# Patient Record
Sex: Male | Born: 1945
Health system: Southern US, Community
[De-identification: ages and names within clinical notes are randomized; demographics above are authoritative.]

## PROBLEM LIST (undated history)

## (undated) DIAGNOSIS — R059 Cough, unspecified: Secondary | ICD-10-CM

## (undated) DIAGNOSIS — R600 Localized edema: Secondary | ICD-10-CM

## (undated) DIAGNOSIS — M1990 Unspecified osteoarthritis, unspecified site: Secondary | ICD-10-CM

## (undated) DIAGNOSIS — E538 Deficiency of other specified B group vitamins: Secondary | ICD-10-CM

## (undated) DIAGNOSIS — Z8042 Family history of malignant neoplasm of prostate: Secondary | ICD-10-CM

## (undated) DIAGNOSIS — J449 Chronic obstructive pulmonary disease, unspecified: Secondary | ICD-10-CM

## (undated) DIAGNOSIS — R05 Cough: Secondary | ICD-10-CM

## (undated) DIAGNOSIS — Z800 Family history of malignant neoplasm of digestive organs: Secondary | ICD-10-CM

## (undated) DIAGNOSIS — IMO0002 Reserved for concepts with insufficient information to code with codable children: Secondary | ICD-10-CM

## (undated) DIAGNOSIS — Z8051 Family history of malignant neoplasm of kidney: Secondary | ICD-10-CM

## (undated) DIAGNOSIS — E785 Hyperlipidemia, unspecified: Secondary | ICD-10-CM

## (undated) DIAGNOSIS — Z803 Family history of malignant neoplasm of breast: Secondary | ICD-10-CM

## (undated) DIAGNOSIS — I10 Essential (primary) hypertension: Secondary | ICD-10-CM

## (undated) DIAGNOSIS — C61 Malignant neoplasm of prostate: Secondary | ICD-10-CM

## (undated) HISTORY — DX: Localized edema: R60.0

## (undated) HISTORY — DX: Family history of malignant neoplasm of breast: Z80.3

## (undated) HISTORY — DX: Essential (primary) hypertension: I10

## (undated) HISTORY — PX: VASECTOMY: SHX75

## (undated) HISTORY — DX: Reserved for concepts with insufficient information to code with codable children: IMO0002

## (undated) HISTORY — DX: Deficiency of other specified B group vitamins: E53.8

## (undated) HISTORY — DX: Family history of malignant neoplasm of prostate: Z80.42

## (undated) HISTORY — DX: Cough: R05

## (undated) HISTORY — DX: Hyperlipidemia, unspecified: E78.5

## (undated) HISTORY — PX: PROSTATE BIOPSY: SHX241

## (undated) HISTORY — DX: Cough, unspecified: R05.9

## (undated) HISTORY — DX: Family history of malignant neoplasm of digestive organs: Z80.0

## (undated) HISTORY — DX: Family history of malignant neoplasm of kidney: Z80.51

---

## 2007-12-19 HISTORY — PX: COLONOSCOPY W/ POLYPECTOMY: SHX1380

## 2009-03-13 HISTORY — PX: FINGER SURGERY: SHX640

## 2011-01-12 DIAGNOSIS — J449 Chronic obstructive pulmonary disease, unspecified: Secondary | ICD-10-CM

## 2011-01-12 DIAGNOSIS — IMO0002 Reserved for concepts with insufficient information to code with codable children: Secondary | ICD-10-CM

## 2011-01-12 HISTORY — DX: Reserved for concepts with insufficient information to code with codable children: IMO0002

## 2011-01-12 HISTORY — DX: Chronic obstructive pulmonary disease, unspecified: J44.9

## 2011-01-12 HISTORY — PX: TRANSTHORACIC ECHOCARDIOGRAM: SHX275

## 2011-01-27 LAB — PULMONARY FUNCTION TEST

## 2012-04-13 LAB — HM COLONOSCOPY

## 2012-09-09 ENCOUNTER — Other Ambulatory Visit: Payer: Self-pay | Admitting: Nurse Practitioner

## 2012-09-09 ENCOUNTER — Telehealth: Payer: Self-pay | Admitting: Nurse Practitioner

## 2012-09-09 ENCOUNTER — Ambulatory Visit (INDEPENDENT_AMBULATORY_CARE_PROVIDER_SITE_OTHER): Payer: Medicare Other | Admitting: Nurse Practitioner

## 2012-09-09 ENCOUNTER — Ambulatory Visit (INDEPENDENT_AMBULATORY_CARE_PROVIDER_SITE_OTHER)
Admission: RE | Admit: 2012-09-09 | Discharge: 2012-09-09 | Disposition: A | Discharge status: Home / Self Care | Payer: Medicare Other | Source: Ambulatory Visit | Attending: Nurse Practitioner | Admitting: Nurse Practitioner

## 2012-09-09 ENCOUNTER — Encounter: Payer: Self-pay | Admitting: Nurse Practitioner

## 2012-09-09 VITALS — BP 152/83 | HR 88 | Temp 98.3°F | Resp 18 | Ht 72.0 in | Wt 293.0 lb

## 2012-09-09 DIAGNOSIS — I739 Peripheral vascular disease, unspecified: Secondary | ICD-10-CM | POA: Diagnosis not present

## 2012-09-09 DIAGNOSIS — J90 Pleural effusion, not elsewhere classified: Secondary | ICD-10-CM | POA: Diagnosis not present

## 2012-09-09 DIAGNOSIS — J189 Pneumonia, unspecified organism: Secondary | ICD-10-CM

## 2012-09-09 DIAGNOSIS — R0789 Other chest pain: Secondary | ICD-10-CM

## 2012-09-09 DIAGNOSIS — M25519 Pain in unspecified shoulder: Secondary | ICD-10-CM

## 2012-09-09 DIAGNOSIS — I10 Essential (primary) hypertension: Secondary | ICD-10-CM

## 2012-09-09 DIAGNOSIS — W57XXXA Bitten or stung by nonvenomous insect and other nonvenomous arthropods, initial encounter: Secondary | ICD-10-CM | POA: Diagnosis not present

## 2012-09-09 DIAGNOSIS — R0602 Shortness of breath: Secondary | ICD-10-CM | POA: Diagnosis not present

## 2012-09-09 DIAGNOSIS — T148 Other injury of unspecified body region: Secondary | ICD-10-CM | POA: Diagnosis not present

## 2012-09-09 LAB — RENAL FUNCTION PANEL
Albumin: 3.6 g/dL (ref 3.5–5.2)
BUN: 18 mg/dL (ref 6–23)
Calcium: 10.3 mg/dL (ref 8.4–10.5)
Phosphorus: 3.8 mg/dL (ref 2.3–4.6)
Potassium: 4 mEq/L (ref 3.5–5.3)
Sodium: 142 mEq/L (ref 135–145)

## 2012-09-09 LAB — CBC
Hemoglobin: 15.1 g/dL (ref 13.0–17.0)
MCH: 28.7 pg (ref 26.0–34.0)
MCHC: 35.3 g/dL (ref 30.0–36.0)
MCV: 81.2 fL (ref 78.0–100.0)

## 2012-09-09 LAB — LIPID PANEL
Cholesterol: 156 mg/dL (ref 0–200)
Total CHOL/HDL Ratio: 4.6 Ratio
VLDL: 35 mg/dL (ref 0–40)

## 2012-09-09 LAB — HEPATIC FUNCTION PANEL
AST: 14 U/L (ref 0–37)
Alkaline Phosphatase: 77 U/L (ref 39–117)
Bilirubin, Direct: 0.2 mg/dL (ref 0.0–0.3)
Total Bilirubin: 0.7 mg/dL (ref 0.3–1.2)

## 2012-09-09 LAB — HEMOGLOBIN A1C: Hgb A1c MFr Bld: 5.2 % (ref <5.7)

## 2012-09-09 MED ORDER — AMOXICILLIN-POT CLAVULANATE ER 1000-62.5 MG PO TB12: 2.0000 | ORAL_TABLET | Freq: Two times a day (BID) | ORAL | Status: DC

## 2012-09-09 NOTE — Patient Instructions (Signed)
Start using compression stockings in the morning. Take a 10-15 min walk in the morning several days a week. Increase by 5 minutes each week as tolerated. Exercise calf by doing "pedal pushes" as shown to help with circulation. I will call with lab results. We will refer you to cardiology for further work-up of the cause of your leg swelling. You have a referral to orthopedics as well. Please follow up with Dr. Laury Axon in 1 month. Pleasure to meet you.

## 2012-09-09 NOTE — Telephone Encounter (Signed)
CXR indicates pt has L pneumonia. Will start augmentin ER. Repeat CXR in 6 wks. Pt advised.

## 2012-09-10 ENCOUNTER — Telehealth: Payer: Self-pay | Admitting: *Deleted

## 2012-09-10 ENCOUNTER — Encounter: Payer: Self-pay | Admitting: Nurse Practitioner

## 2012-09-10 DIAGNOSIS — I739 Peripheral vascular disease, unspecified: Secondary | ICD-10-CM | POA: Insufficient documentation

## 2012-09-10 DIAGNOSIS — M25519 Pain in unspecified shoulder: Secondary | ICD-10-CM | POA: Insufficient documentation

## 2012-09-10 DIAGNOSIS — I10 Essential (primary) hypertension: Secondary | ICD-10-CM | POA: Insufficient documentation

## 2012-09-10 MED ORDER — HYDROCHLOROTHIAZIDE 25 MG PO TABS: 25.0000 mg | ORAL_TABLET | Freq: Every day | ORAL | Status: DC

## 2012-09-10 MED ORDER — LOSARTAN POTASSIUM 100 MG PO TABS: 100.0000 mg | ORAL_TABLET | Freq: Every day | ORAL | Status: DC

## 2012-09-10 NOTE — Telephone Encounter (Signed)
Called patient per Layne to schedule f/u appointment in one week 09/17/12 at 10:45 am. Also sent in hydrochlorothiazide and losartin rx's into [facility] on [address].

## 2012-09-10 NOTE — Progress Notes (Addendum)
Subjective:    Travis Daniels is a 67 y.o. male who presents for evaluation of edema in the left lower leg. He has never been seen by a provider in this practice, but plans to establish with Dr. Laury Axon. I have no records for review today. Pt & wife are giving medical history. The edema has been severe and accompanied by dusky discoloration and tenderness . Onset of symptoms was 4 days ago, and patient reports symptoms have gradually worsened since that time. The edema is present all day and worse in the evening. The patient states the problem is long-standing and has occured in the past, and in both legs. He understands the condition to be cellulitis, and was treated with antibiotics in the past.. The swelling has been aggravated by nothing and although, of note, 1 week ago he developed HA & fever with chest tightness that lasted 1 or 2 days followed by several days of fatigue & worsened leg edema.. The swelling has been relieved by nothing, although he has used compression stockings in the past for this problem.. Associated factors include: shortness of breath and venous insufficiency. Cardiac risk factors: advanced age (older than 73 for men, 72 for women), dyslipidemia, hypertension, male gender, obesity (BMI >= 30 kg/m2) and sedentary lifestyle. Of note, he gives Hx of tick bite under L axillae approx 10 days ago. He also removed 3 ticks from his wife. Unrelated, he gives Hx of bilateral shoulder pain & states he was told he has no cartilage in shoulders.  The following portions of the patient's history were reviewed and updated as appropriate: allergies, current medications, past family history, past medical history, past social history and problem list.  Review of Systems Constitutional: positive for chills, fatigue, fevers and sweats, negative for anorexia and weight loss Ears, nose, mouth, throat, and face: positive for sore throat, voice change and lesions in mouth Respiratory: positive for cough  and dyspnea on exertion, negative for chronic bronchitis, emphysema, hemoptysis, pleurisy/chest pain, sputum and wheezing Cardiovascular: positive for chest pressure/discomfort, dyspnea and lower extremity edema, negative for irregular heart beat, near-syncope and palpitations Gastrointestinal: negative for abdominal pain, change in bowel habits and vomiting Integument/breast: negative for rash and positive for color change in L LE-dusky discoloration Musculoskeletal:negative for arthralgias, muscle weakness and myalgias Neurological: positive for headaches, negative for coordination problems, dizziness and weakness   Objective:    BP 152/83  Pulse 88  Temp(Src) 98.3 F (36.8 C) (Oral)  Resp 18  Ht 6' (1.829 m)  Wt 293 lb (132.904 kg)  BMI 39.73 kg/m2  SpO2 96% General appearance: alert, cooperative, appears stated age, fatigued, no distress, moderately obese and pale Head: Normocephalic, without obvious abnormality, atraumatic Ears: normal TM's and external ear canals both ears Throat: mucosa pink, moist. Has cluster of lesions L upper gum at tooth that has filling-looks like a herpes rash. posterior pharynx normal. Lungs: diminished breath sounds LLL Heart: regular rate and rhythm, S1, S2 normal, no murmur, click, rub or gallop Extremities: edema +3 nonpitting LLE, +1 nonpitting RLE, post-inflam changes RLE-copper colored discoloration as in hemocyderin infiltration. Dusky discoloration and orange peel skin LLE., varicose veins noted and venous stasis dermatitis noted Pulses: 2+ RLE pedal, 3+ LLE pedal Skin: No rash, turgor normal, lesions in mouth-see ENT, see extremities. Lymph nodes: Cervical, supraclavicular, and axillary nodes normal.   Cardiographics ECG: normal sinus rhythm  Imaging Chest x-ray: R lung clear, L basilar airspace disease and effusion vs central lesion, elevated L hemidiaphragm, heart size  not clear  Assessment:  1 SOB, Hx chest tightness & fever 1 week ago,  CXR & CBC 2 LE edema with venus stasis secondary to peripheral vascular disease, ECG, BNP, TSH today 3 mouth lesions 4 hypertension, elevated in ofc today, check renal & liver func 5 Current Tx for hyperlipidemia-check lipids today 6Hx bilateral shoulder pain w/decreased ROM  Plan:    1 CXR shows LLL effusion. Will Tx as pneumonia. Start augmentin, f/u in ofc in 1 week. 2 Start using compression stockings, lower leg exercises, soaks warm tub, 10-15 min. Walk in am as tolerated Ref to cardiology for further w/u.  The patient was also instructed to call IMMEDIATELY (i.e., day or night) if any cardiopulmonary symptoms occur, especially chest pain, shortness of breath,  dyspnea on exertion, paroxysmal nocturnal dyspnea, or orthopnea, and these were explained. 3 uncertain etiology, CBC indicates infection-CXR shows effusion, may be r/t agent causing pneumonia. Continue to monitor. 4 elevated BP today, will continue to monitor, rec weight loss. 5 Continue current meds, exercise 6 referral to orthopedist

## 2012-09-11 LAB — ROCKY MTN SPOTTED FVR ABS PNL(IGG+IGM): RMSF IgG: 0.33 IV

## 2012-09-12 ENCOUNTER — Telehealth: Payer: Self-pay | Admitting: Nurse Practitioner

## 2012-09-12 NOTE — Telephone Encounter (Signed)
See telephone notes. Advised pt to go to ED if he experiences chest pain w/inspiration, fever, increased SOB. Advised to do lung exercises 3 times daily by blowing up a balloon. Continue compression stockings and antibiotics.

## 2012-09-17 ENCOUNTER — Ambulatory Visit (INDEPENDENT_AMBULATORY_CARE_PROVIDER_SITE_OTHER): Payer: Medicare Other | Admitting: Nurse Practitioner

## 2012-09-17 ENCOUNTER — Ambulatory Visit (INDEPENDENT_AMBULATORY_CARE_PROVIDER_SITE_OTHER)
Admission: RE | Admit: 2012-09-17 | Discharge: 2012-09-17 | Disposition: A | Discharge status: Home / Self Care | Payer: Medicare Other | Source: Ambulatory Visit | Attending: Nurse Practitioner | Admitting: Nurse Practitioner

## 2012-09-17 ENCOUNTER — Encounter: Payer: Self-pay | Admitting: Nurse Practitioner

## 2012-09-17 VITALS — BP 122/86 | HR 81 | Temp 98.1°F | Ht 72.0 in | Wt 296.5 lb

## 2012-09-17 DIAGNOSIS — I8312 Varicose veins of left lower extremity with inflammation: Secondary | ICD-10-CM

## 2012-09-17 DIAGNOSIS — I8310 Varicose veins of unspecified lower extremity with inflammation: Secondary | ICD-10-CM

## 2012-09-17 DIAGNOSIS — I872 Venous insufficiency (chronic) (peripheral): Secondary | ICD-10-CM | POA: Insufficient documentation

## 2012-09-17 DIAGNOSIS — R609 Edema, unspecified: Secondary | ICD-10-CM | POA: Diagnosis not present

## 2012-09-17 DIAGNOSIS — J9819 Other pulmonary collapse: Secondary | ICD-10-CM | POA: Diagnosis not present

## 2012-09-17 DIAGNOSIS — I10 Essential (primary) hypertension: Secondary | ICD-10-CM

## 2012-09-17 DIAGNOSIS — J189 Pneumonia, unspecified organism: Secondary | ICD-10-CM | POA: Diagnosis not present

## 2012-09-17 MED ORDER — HYDROCHLOROTHIAZIDE 25 MG PO TABS: ORAL_TABLET | ORAL | Status: DC

## 2012-09-17 NOTE — Patient Instructions (Addendum)
Continue antibiotics. Make taking a walk a priority to help with circulation. Increase hydrochlorathiazide to 1 1/2 Tabs daily.  Keep appointment with Dr. Laury Axon.

## 2012-09-17 NOTE — Progress Notes (Signed)
Subjective:     Travis Daniels is an 67 y.o. male who presents for follow up evaluation of pneumonia and periperal vascular disease with venous stasis. Regarding pneumonia: he had a CXR 1 week ago that showed LLL effusion with elevated L hemidiaphragm, WBC 13k, and gave a history of fever, chest tightness & SOB 1 week prior, with all symptoms resolved except SOB & fatigue. I started him on high dose amoxicillin. Today, he states fatigue has improved and denies chest pain, fever, and SOB.  Regarding peripheral vascular disease with venous stasis: He thinks L leg swelling has improved since he has been using compression stockings. He denies pain in legs. Although heart size was undeterminable on CXR 1 week ago due to LLL effusion & elevated L hemidiaphragm, it is unlikely that heart failure is a cause for his periperal edema as BNP was low at 19. Risks factors for atherosclerosis include: hypercholesterolemia, hypertension and peripheral vascular disease. The patient denies a history of cerebral vascular disease, diabetes mellitus and smoking.  The following portions of the patient's history were reviewed and updated as appropriate: allergies, current medications, past medical history, past social history and problem list.  Review of Systems Constitutional: negative for chills, fatigue, fevers and night sweats Ears, nose, mouth, throat, and face: negative for sore throat Respiratory: negative for cough, pleurisy/chest pain, sputum and wheezing Cardiovascular: positive for lower extremity edema and HTN, hypercholesterolemia, negative for chest pain, dyspnea, exertional chest pressure/discomfort, fatigue and palpitations Gastrointestinal: negative for diarrhea and nausea     Objective:    BP 122/86  Pulse 81  Temp(Src) 98.1 F (36.7 C) (Oral)  Ht 6' (1.829 m)  Wt 296 lb 8 oz (134.492 kg)  BMI 40.2 kg/m2  SpO2 95% General appearance: alert, cooperative, appears stated age and no  distress Lungs: diminished breath sounds LLL Heart: regular rate and rhythm, S1, S2 normal, no murmur, click, rub or gallop Extremities: edema +3 pitting edema to proximal tibia LLE, varicose veins noted, venous stasis dermatitis noted and dusky discoloration is unchanged Skin: no orange peel skin today on LLE Lymph nodes: Cervical, supraclavicular, and axillary nodes normal.    Assessment:  1 pneumonia-  fatigue & SOB resolved. 2 days antibiotics left. 2 HTN- initially elevated in office, normal range after sitting in chair.  3 peripheral edema with venous stasis dermatitis. Dusky color and edema persistent, orange peel skin resolved.       Plan:  1 f/u CXR today, Continue antibiotics until finished. 2 Continue Cozaar, 3 Continue compression stockings, 10-15 minute walk daily in am to improve circulation, increase HCTZ from 1T to 1 1/2 T daily until appt. W/Dr. Laury Axon.

## 2012-09-23 DIAGNOSIS — M25519 Pain in unspecified shoulder: Secondary | ICD-10-CM | POA: Diagnosis not present

## 2012-09-23 DIAGNOSIS — M19019 Primary osteoarthritis, unspecified shoulder: Secondary | ICD-10-CM | POA: Diagnosis not present

## 2012-09-26 ENCOUNTER — Telehealth: Payer: Self-pay | Admitting: Nurse Practitioner

## 2012-09-26 DIAGNOSIS — M25519 Pain in unspecified shoulder: Secondary | ICD-10-CM | POA: Diagnosis not present

## 2012-09-26 DIAGNOSIS — M25669 Stiffness of unspecified knee, not elsewhere classified: Secondary | ICD-10-CM | POA: Diagnosis not present

## 2012-09-26 DIAGNOSIS — M19019 Primary osteoarthritis, unspecified shoulder: Secondary | ICD-10-CM | POA: Diagnosis not present

## 2012-09-26 NOTE — Telephone Encounter (Signed)
2nd CXR shows no worsening in L effusion, heart is normal size. Discussed w/wife. Pt is at PT for shoulder eval, after having seen ortho.

## 2012-09-26 NOTE — Telephone Encounter (Signed)
Message copied by Maximino Sarin [facility] on Thu Sep 26, 2012  9:46 AM ------      Message from: Alben Spittle, [place] [facility]      Created: Tue Sep 17, 2012  4:57 PM       Call re: CXR. ------

## 2012-09-30 ENCOUNTER — Encounter: Payer: Self-pay | Admitting: Family Medicine

## 2012-09-30 ENCOUNTER — Ambulatory Visit (INDEPENDENT_AMBULATORY_CARE_PROVIDER_SITE_OTHER): Payer: Medicare Other | Admitting: Family Medicine

## 2012-09-30 VITALS — BP 152/94 | HR 65 | Temp 98.5°F | Ht 72.75 in | Wt 300.0 lb

## 2012-09-30 DIAGNOSIS — Z0000 Encounter for general adult medical examination without abnormal findings: Secondary | ICD-10-CM | POA: Diagnosis not present

## 2012-09-30 DIAGNOSIS — M25519 Pain in unspecified shoulder: Secondary | ICD-10-CM

## 2012-09-30 DIAGNOSIS — R609 Edema, unspecified: Secondary | ICD-10-CM | POA: Diagnosis not present

## 2012-09-30 DIAGNOSIS — Z23 Encounter for immunization: Secondary | ICD-10-CM | POA: Diagnosis not present

## 2012-09-30 DIAGNOSIS — I10 Essential (primary) hypertension: Secondary | ICD-10-CM

## 2012-09-30 DIAGNOSIS — R600 Localized edema: Secondary | ICD-10-CM

## 2012-09-30 MED ORDER — FUROSEMIDE 20 MG PO TABS: 20.0000 mg | ORAL_TABLET | Freq: Every day | ORAL | Status: DC

## 2012-09-30 MED ORDER — AMLODIPINE BESYLATE 5 MG PO TABS: 5.0000 mg | ORAL_TABLET | Freq: Every day | ORAL | Status: DC

## 2012-09-30 MED ORDER — ZOSTER VACCINE LIVE 19400 UNT/0.65ML ~~LOC~~ SOLR: 0.6500 mL | Freq: Once | SUBCUTANEOUS | Status: DC

## 2012-09-30 MED ORDER — LOSARTAN POTASSIUM 100 MG PO TABS: 100.0000 mg | ORAL_TABLET | Freq: Every day | ORAL | Status: DC

## 2012-09-30 NOTE — Assessment & Plan Note (Signed)
Per ortho.  

## 2012-09-30 NOTE — Progress Notes (Signed)
Subjective:    [name], male    DOB: [birth year], 67 y.o.   MRN: [id number]  HPI    Review of Systems     Objective:   Physical Exam        Assessment & Plan:   Subjective:    Travis Daniels is a 67 y.o. male who presents for Medicare Initial preventive examination.   Preventive Screening-Counseling & Management  Tobacco History  Smoking status  . Never Smoker   Smokeless tobacco  . Not on file    Problems Prior to Visit 1. S/p pneumonia  Current Problems (verified) Patient Active Problem List   Diagnosis Date Noted  . Venous stasis dermatitis 09/17/2012  . Essential hypertension, benign 09/10/2012  . Peripheral vascular disease 09/10/2012  . Pain in joint, shoulder region 09/10/2012    Medications Prior to Visit Current Outpatient Prescriptions on File Prior to Visit  Medication Sig Dispense Refill  . aspirin 81 MG tablet Take 81 mg by mouth daily.      . Cholecalciferol (VITAMIN D-3) 1000 UNITS CAPS Take 1,000 Units by mouth.      . hydrochlorothiazide (HYDRODIURIL) 25 MG tablet Take 1 1/2 tabs po qd.  30 tablet  1  . tiotropium (SPIRIVA) 18 MCG inhalation capsule Place 18 mcg into inhaler and inhale daily.      . vitamin B-12 (CYANOCOBALAMIN) 1000 MCG tablet Take 1,000 mcg by mouth daily.       No current facility-administered medications on file prior to visit.    Current Medications (verified) Current Outpatient Prescriptions  Medication Sig Dispense Refill  . aspirin 81 MG tablet Take 81 mg by mouth daily.      . Cholecalciferol (VITAMIN D-3) 1000 UNITS CAPS Take 1,000 Units by mouth.      . hydrochlorothiazide (HYDRODIURIL) 25 MG tablet Take 1 1/2 tabs po qd.  30 tablet  1  . losartan (COZAAR) 100 MG tablet Take 1 tablet (100 mg total) by mouth daily.  90 tablet  3  . tiotropium (SPIRIVA) 18 MCG inhalation capsule Place 18 mcg into inhaler and inhale daily.      . vitamin B-12 (CYANOCOBALAMIN) 1000 MCG tablet Take 1,000  mcg by mouth daily.      . furosemide (LASIX) 20 MG tablet Take 1 tablet (20 mg total) by mouth daily.  30 tablet  3   No current facility-administered medications for this visit.     Allergies (verified) Review of patient's allergies indicates no known allergies.   PAST HISTORY  Family History Family History  Problem Relation Age of Onset  . Cancer Father     colon and pancreas  . Epilepsy Son     Social History History  Substance Use Topics  . Smoking status: Never Smoker   . Smokeless tobacco: Not on file  . Alcohol Use: No    Are there smokers in your home (other than you)?  No  Risk Factors Current exercise habits: walks a little  Dietary issues discussed: na   Cardiac risk factors: advanced age (older than 41 for men, 69 for women), dyslipidemia, hypertension, male gender, obesity (BMI >= 30 kg/m2) and sedentary lifestyle.  Depression Screen (Note: if answer to either of the following is "Yes", a more complete depression screening is indicated)   Q1: Over the past two weeks, have you felt down, depressed or hopeless? No  Q2: Over the past two weeks, have you felt little interest or pleasure in doing things?  No  Have you lost interest or pleasure in daily life? No  Do you often feel hopeless? No  Do you cry easily over simple problems? No  Activities of Daily Living In your present state of health, do you have any difficulty performing the following activities?:  Driving? No Managing money?  No Feeding yourself? No Getting from bed to chair? No Climbing a flight of stairs? No Preparing food and eating?: No Bathing or showering? No Getting dressed: No Getting to the toilet? No Using the toilet:No Moving around from place to place: No In the past year have you fallen or had a near fall?:No   Are you sexually active?  Yes  Do you have more than one partner?  No  Hearing Difficulties: No Do you often ask people to speak up or repeat themselves? No Do  you experience ringing or noises in your ears? No Do you have difficulty understanding soft or whispered voices? No   Do you feel that you have a problem with memory? No  Do you often misplace items? No  Do you feel safe at home?  Yes  Cognitive Testing  Alert? Yes  Normal Appearance?Yes  Oriented to person? Yes  Place? Yes   Time? Yes  Recall of three objects?  Yes  Can perform simple calculations? Yes  Displays appropriate judgment?Yes  Can read the correct time from a watch face?Yes   Advanced Directives have been discussed with the patient? Yes   List the Names of Other Physician/Practitioners you currently use: 1.  Ortho-- murphy, wainer  Indicate any recent Medical Services you may have received from other than [facility] providers in the past year (date may be approximate).  Immunization History  Administered Date(s) Administered  . Pneumococcal Conjugate 11/12/2011    Screening Tests Health Maintenance  Topic Date Due  . Tetanus/tdap  04/18/1964  . Zostavax  04/18/2005  . Pneumococcal Polysaccharide Vaccine Age 28 And Over  04/18/2010  . Influenza Vaccine  11/11/2012  . Colonoscopy  04/13/2022    All answers were reviewed with the patient and necessary referrals were made:  Loreen Freud, DO   09/30/2012   History reviewed:  He  has a past medical history of Hyperlipidemia; Hypertension; Cough; and B12 deficiency. He  does not have any pertinent problems on file. He  has no past surgical history on file. His family history includes Cancer in his father and Epilepsy in his son. He  reports that he has never smoked. He does not have any smokeless tobacco history on file. He reports that he does not drink alcohol or use illicit drugs. He has a current medication list which includes the following prescription(s): aspirin, vitamin d-3, hydrochlorothiazide, losartan, tiotropium, vitamin b-12, and furosemide. Current Outpatient Prescriptions on File Prior to Visit   Medication Sig Dispense Refill  . aspirin 81 MG tablet Take 81 mg by mouth daily.      . Cholecalciferol (VITAMIN D-3) 1000 UNITS CAPS Take 1,000 Units by mouth.      . hydrochlorothiazide (HYDRODIURIL) 25 MG tablet Take 1 1/2 tabs po qd.  30 tablet  1  . tiotropium (SPIRIVA) 18 MCG inhalation capsule Place 18 mcg into inhaler and inhale daily.      . vitamin B-12 (CYANOCOBALAMIN) 1000 MCG tablet Take 1,000 mcg by mouth daily.       No current facility-administered medications on file prior to visit.   He has No Known Allergies.  Review of Systems  Review of Systems  Constitutional: Negative for activity change, appetite change and fatigue.  HENT: Negative for hearing loss, congestion, tinnitus and ear discharge.  dentist--q56m Eyes: Negative for visual disturbance (see optho q1y -- vision corrected to 20/20 with glasses).  Respiratory: Negative for cough, chest tightness and shortness of breath.   Cardiovascular: Negative for chest pain, palpitations and leg swelling.  Gastrointestinal: Negative for abdominal pain, diarrhea, constipation and abdominal distention.  Genitourinary: Negative for urgency, frequency, decreased urine volume and difficulty urinating.  Musculoskeletal: Negative for back pain, arthralgias and gait problem.  Skin: Negative for color change, pallor and rash.  Neurological: Negative for dizziness, light-headedness, numbness and headaches.  Hematological: Negative for adenopathy. Does not bruise/bleed easily.  Psychiatric/Behavioral: Negative for suicidal ideas, confusion, sleep disturbance, self-injury, dysphoric mood, decreased concentration and agitation.  Pt is able to read and write and can do all ADLs No risk for falling No abuse/ violence in home   Objective:     Vision by Snellen chart: opth Blood pressure 138/90, pulse 65, temperature 98.5 F (36.9 C), temperature source Oral, height 6' 0.75" (1.848 m), weight 300 lb (136.079 kg), SpO2 96.00%. Body  mass index is 39.85 kg/(m^2).  BP 138/90  Pulse 65  Temp(Src) 98.5 F (36.9 C) (Oral)  Ht 6' 0.75" (1.848 m)  Wt 300 lb (136.079 kg)  BMI 39.85 kg/m2  SpO2 96% General appearance: alert, cooperative, appears stated age and no distress Head: Normocephalic, without obvious abnormality, atraumatic Eyes: conjunctivae/corneas clear. PERRL, EOM's intact. Fundi benign. Ears: normal TM's and external ear canals both ears Nose: Nares normal. Septum midline. Mucosa normal. No drainage or sinus tenderness. Throat: lips, mucosa, and tongue normal; teeth and gums normal Neck: no adenopathy, no carotid bruit, no JVD, supple, symmetrical, trachea midline and thyroid not enlarged, symmetric, no tenderness/mass/nodules Back: symmetric, no curvature. ROM normal. No CVA tenderness. Lungs: clear to auscultation bilaterally Chest wall: no tenderness Heart: regular rate and rhythm, S1, S2 normal, no murmur, click, rub or gallop Abdomen: soft, non-tender; bowel sounds normal; no masses,  no organomegaly-- umbilical hernia Male genitalia: normal, deferred, penis: no lesions or discharge. testes: no masses or tenderness. no hernias Rectal: normal tone, normal prostate, no masses or tenderness and soft brown guaiac negative stool noted Extremities: + 1 pitting edema and stasis dermatitis Pulses: 2+ and symmetric Skin: Skin color, texture, turgor normal. No rashes or lesions Lymph nodes: Cervical, supraclavicular, and axillary nodes normal. Neurologic: Alert and oriented X 3, normal strength and tone. Normal symmetric reflexes. Normal coordination and gait Psych-- no anxiety, no depression      Assessment:     cpe--- check labs             ghm utd         See avs      Plan:     During the course of the visit the patient was educated and counseled about appropriate screening and preventive services including:    Pneumococcal vaccine   Influenza vaccine  Td vaccine  Prostate cancer  screening  Colorectal cancer screening  Diabetes screening  Glaucoma screening  Advanced directives: has NO advanced directive  - add't info requested. Referral to SW: pt told about class at hospital  Diet review for nutrition referral? Yes ____  Not Indicated ___x_   Patient Instructions (the written plan) was given to the patient.  Medicare Attestation I have personally reviewed: The patient's medical and social history Their use of alcohol, tobacco or illicit drugs Their current medications and supplements  The patient's functional ability including ADLs,fall risks, home safety risks, cognitive, and hearing and visual impairment Diet and physical activities Evidence for depression or mood disorders  The patient's weight, height, BMI, and visual acuity have been recorded in the chart.  I have made referrals, counseling, and provided education to the patient based on review of the above and I have provided the patient with a written personalized care plan for preventive services.     Loreen Freud, DO   09/30/2012

## 2012-09-30 NOTE — Assessment & Plan Note (Signed)
Elevated--- add norvasc 5 m g rto 3 weeks or sooner prn

## 2012-09-30 NOTE — Patient Instructions (Addendum)
Preventive Care for Adults, Male  A healthy lifestyle and preventive care can promote health and wellness. Preventive health guidelines for men include the following key practices:  · A routine yearly physical is a good way to check with your caregiver about your health and preventative screening. It is a chance to share any concerns and updates on your health, and to receive a thorough exam.  · Visit your dentist for a routine exam and preventative care every 6 months. Brush your teeth twice a day and floss once a day. Good oral hygiene prevents tooth decay and gum disease.  · The frequency of eye exams is based on your age, health, family medical history, use of contact lenses, and other factors. Follow your caregiver's recommendations for frequency of eye exams.  · Eat a healthy diet. Foods like vegetables, fruits, whole grains, low-fat dairy products, and lean protein foods contain the nutrients you need without too many calories. Decrease your intake of foods high in solid fats, added sugars, and salt. Eat the right amount of calories for you. Get information about a proper diet from your caregiver, if necessary.  · Regular physical exercise is one of the most important things you can do for your health. Most adults should get at least 150 minutes of moderate-intensity exercise (any activity that increases your heart rate and causes you to sweat) each week. In addition, most adults need muscle-strengthening exercises on 2 or more days a week.  · Maintain a healthy weight. The body mass index (BMI) is a screening tool to identify possible weight problems. It provides an estimate of body fat based on height and weight. Your caregiver can help determine your BMI, and can help you achieve or maintain a healthy weight. For adults 20 years and older:  · A BMI below 18.5 is considered underweight.  · A BMI of 18.5 to 24.9 is normal.  · A BMI of 25 to 29.9 is considered overweight.  · A BMI of 30 and above is  considered obese.  · Maintain normal blood lipids and cholesterol levels by exercising and minimizing your intake of saturated fat. Eat a balanced diet with plenty of fruit and vegetables. Blood tests for lipids and cholesterol should begin at age 20 and be repeated every 5 years. If your lipid or cholesterol levels are high, you are over 50, or you are a high risk for heart disease, you may need your cholesterol levels checked more frequently. Ongoing high lipid and cholesterol levels should be treated with medicines if diet and exercise are not effective.  · If you smoke, find out from your caregiver how to quit. If you do not use tobacco, do not start.  · If you choose to drink alcohol, do not exceed 2 drinks per day. One drink is considered to be 12 ounces (355 mL) of beer, 5 ounces (148 mL) of wine, or 1.5 ounces (44 mL) of liquor.  · Avoid use of street drugs. Do not share needles with anyone. Ask for help if you need support or instructions about stopping the use of drugs.  · High blood pressure causes heart disease and increases the risk of stroke. Your blood pressure should be checked at least every 1 to 2 years. Ongoing high blood pressure should be treated with medicines, if weight loss and exercise are not effective.  · If you are 45 to 67 years old, ask your caregiver if you should take aspirin to prevent heart disease.  · Diabetes screening involves taking   a blood sample to check your fasting blood sugar level. This should be done once every 3 years, after age 45, if you are within normal weight and without risk factors for diabetes. Testing should be considered at a younger age or be carried out more frequently if you are overweight and have at least 1 risk factor for diabetes.  · Colorectal cancer can be detected and often prevented. Most routine colorectal cancer screening begins at the age of 50 and continues through age 75. However, your caregiver may recommend screening at an earlier age if you  have risk factors for colon cancer. On a yearly basis, your caregiver may provide home test kits to check for hidden blood in the stool. Use of a small camera at the end of a tube, to directly examine the colon (sigmoidoscopy or colonoscopy), can detect the earliest forms of colorectal cancer. Talk to your caregiver about this at age 50, when routine screening begins.  Direct examination of the colon should be repeated every 5 to 10 years through age 75, unless early forms of pre-cancerous polyps or small growths are found.  · Hepatitis C blood testing is recommended for all people born from 1945 through 1965 and any individual with known risks for hepatitis C.  · Practice safe sex. Use condoms and avoid high-risk sexual practices to reduce the spread of sexually transmitted infections (STIs). STIs include gonorrhea, chlamydia, syphilis, trichomonas, herpes, HPV, and human immunodeficiency virus (HIV). Herpes, HIV, and HPV are viral illnesses that have no cure. They can result in disability, cancer, and death.  · A one-time screening for abdominal aortic aneurysm (AAA) and surgical repair of large AAAs by sound wave imaging (ultrasonography) is recommended for ages 65 to 75 years who are current or former smokers.  · Healthy men should no longer receive prostate-specific antigen (PSA) blood tests as part of routine cancer screening. Consult with your caregiver about prostate cancer screening.  · Testicular cancer screening is not recommended for adult males who have no symptoms. Screening includes self-exam, caregiver exam, and other screening tests. Consult with your caregiver about any symptoms you have or any concerns you have about testicular cancer.  · Use sunscreen with skin protection factor (SPF) of 30 or more. Apply sunscreen liberally and repeatedly throughout the day. You should seek shade when your shadow is shorter than you. Protect yourself by wearing long sleeves, pants, a wide-brimmed hat, and  sunglasses year round, whenever you are outdoors.  · Once a month, do a whole body skin exam, using a mirror to look at the skin on your back. Notify your caregiver of new moles, moles that have irregular borders, moles that are larger than a pencil eraser, or moles that have changed in shape or color.  · Stay current with required immunizations.  · Influenza. You need a dose every fall (or winter). The composition of the flu vaccine changes each year, so being vaccinated once is not enough.  · Pneumococcal polysaccharide. You need 1 to 2 doses if you smoke cigarettes or if you have certain chronic medical conditions. You need 1 dose at age 65 (or older) if you have never been vaccinated.  · Tetanus, diphtheria, pertussis (Tdap, Td). Get 1 dose of Tdap vaccine if you are younger than age 65 years, are over 65 and have contact with an infant, are a healthcare worker, or simply want to be protected from whooping cough. After that, you need a Td booster dose every 10 years. Consult your   caregiver if you have not had at least 3 tetanus and diphtheria-containing shots sometime in your life or have a deep or dirty wound.  · HPV. This vaccine is recommended for males 13 through 67 years of age. This vaccine may be given to men 22 through 67 years of age who have not completed the 3 dose series. It is recommended for men through age 26 who have sex with men or whose immune system is weakened because of HIV infection, other illness, or medications. The vaccine is given in 3 doses over 6 months.  · Measles, mumps, rubella (MMR). You need at least 1 dose of MMR if you were born in 1957 or later. You may also need a 2nd dose.  · Meningococcal. If you are age 19 to 21 years and a first-year college student living in a residence hall, or have one of several medical conditions, you need to get vaccinated against meningococcal disease. You may also need additional booster doses.  · Zoster (shingles). If you are age 60 years or  older, you should get this vaccine.  · Varicella (chickenpox). If you have never had chickenpox or you were vaccinated but received only 1 dose, talk to your caregiver to find out if you need this vaccine.  · Hepatitis A. You need this vaccine if you have a specific risk factor for hepatitis A virus infection, or you simply wish to be protected from this disease. The vaccine is usually given as 2 doses, 6 to 18 months apart.  · Hepatitis B. You need this vaccine if you have a specific risk factor for hepatitis B virus infection or you simply wish to be protected from this disease. The vaccine is given in 3 doses, usually over 6 months.  Preventative Service / Frequency  Ages 19 to 39  · Blood pressure check.** / Every 1 to 2 years.  · Lipid and cholesterol check.** / Every 5 years beginning at age 20.  · Hepatitis C blood test.** / For any individual with known risks for hepatitis C.  · Skin self-exam. / Monthly.  · Influenza immunization.** / Every year.  · Pneumococcal polysaccharide immunization.** / 1 to 2 doses if you smoke cigarettes or if you have certain chronic medical conditions.  · Tetanus, diphtheria, pertussis (Tdap,Td) immunization. / A one-time dose of Tdap vaccine. After that, you need a Td booster dose every 10 years.  · HPV immunization. / 3 doses over 6 months, if 26 and younger.  · Measles, mumps, rubella (MMR) immunization. / You need at least 1 dose of MMR if you were born in 1957 or later. You may also need a 2nd dose.  · Meningococcal immunization. / 1 dose if you are age 19 to 21 years and a first-year college student living in a residence hall, or have one of several medical conditions, you need to get vaccinated against meningococcal disease. You may also need additional booster doses.  · Varicella immunization.** / Consult your caregiver.  · Hepatitis A immunization.** / Consult your caregiver. 2 doses, 6 to 18 months apart.  · Hepatitis B immunization.** / Consult your caregiver. 3 doses  usually over 6 months.  Ages 40 to 64  · Blood pressure check.** / Every 1 to 2 years.  · Lipid and cholesterol check.** / Every 5 years beginning at age 20.  · Fecal occult blood test (FOBT) of stool. / Every year beginning at age 50 and continuing until age 75. You may not have   to do this test if you get colonoscopy every 10 years.  · Flexible sigmoidoscopy** or colonoscopy.** / Every 5 years for a flexible sigmoidoscopy or every 10 years for a colonoscopy beginning at age 50 and continuing until age 75.  · Hepatitis C blood test.** / For all people born from 1945 through 1965 and any individual with known risks for hepatitis C.  · Skin self-exam. / Monthly.  · Influenza immunization.** / Every year.  · Pneumococcal polysaccharide immunization.** / 1 to 2 doses if you smoke cigarettes or if you have certain chronic medical conditions.  · Tetanus, diphtheria, pertussis (Tdap/Td) immunization.** / A one-time dose of Tdap vaccine. After that, you need a Td booster dose every 10 years.  · Measles, mumps, rubella (MMR) immunization.  / You need at least 1 dose of MMR if you were born in 1957 or later. You may also need a 2nd dose.  · Varicella immunization.**/ Consult your caregiver.  · Meningococcal immunization.** / Consult your caregiver.  · Hepatitis A immunization.** / Consult your caregiver. 2 doses, 6 to 18 months apart.  · Hepatitis B immunization.** / Consult your caregiver. 3 doses, usually over 6 months.  Ages 65 and over  · Blood pressure check.** / Every 1 to 2 years.  · Lipid and cholesterol check.**/ Every 5 years beginning at age 20.  · Fecal occult blood test (FOBT) of stool. / Every year beginning at age 50 and continuing until age 75. You may not have to do this test if you get colonoscopy every 10 years.  · Flexible sigmoidoscopy** or colonoscopy.** / Every 5 years for a flexible sigmoidoscopy or every 10 years for a colonoscopy beginning at age 50 and continuing until age 75.  · Hepatitis C blood  test.** / For all people born from 1945 through 1965 and any individual with known risks for hepatitis C.  · Abdominal aortic aneurysm (AAA) screening.** / A one-time screening for ages 65 to 75 years who are current or former smokers.  · Skin self-exam. / Monthly.  · Influenza immunization.** / Every year.  · Pneumococcal polysaccharide immunization.** / 1 dose at age 65 (or older) if you have never been vaccinated.  · Tetanus, diphtheria, pertussis (Tdap, Td) immunization. / A one-time dose of Tdap vaccine if you are over 65 and have contact with an infant, are a healthcare worker, or simply want to be protected from whooping cough. After that, you need a Td booster dose every 10 years.  · Varicella immunization. ** / Consult your caregiver.  · Meningococcal immunization.** / Consult your caregiver.  · Hepatitis A immunization. ** / Consult your caregiver. 2 doses, 6 to 18 months apart.  · Hepatitis B immunization.** / Check with your caregiver. 3 doses, usually over 6 months.  **Family history and personal history of risk and conditions may change your caregiver's recommendations.  Document Released: 04/25/2001 Document Revised: 05/22/2011 Document Reviewed: 07/25/2010  Exit[employer]® Patient Information ©2014 [employer].

## 2012-10-03 DIAGNOSIS — M19019 Primary osteoarthritis, unspecified shoulder: Secondary | ICD-10-CM | POA: Diagnosis not present

## 2012-10-04 ENCOUNTER — Other Ambulatory Visit (INDEPENDENT_AMBULATORY_CARE_PROVIDER_SITE_OTHER): Payer: Medicare Other

## 2012-10-04 DIAGNOSIS — R319 Hematuria, unspecified: Secondary | ICD-10-CM

## 2012-10-04 DIAGNOSIS — I10 Essential (primary) hypertension: Secondary | ICD-10-CM | POA: Diagnosis not present

## 2012-10-04 DIAGNOSIS — R600 Localized edema: Secondary | ICD-10-CM

## 2012-10-04 DIAGNOSIS — R609 Edema, unspecified: Secondary | ICD-10-CM

## 2012-10-04 LAB — BASIC METABOLIC PANEL
CO2: 33 mEq/L — ABNORMAL HIGH (ref 19–32)
Calcium: 9.1 mg/dL (ref 8.4–10.5)
Creatinine, Ser: 1.3 mg/dL (ref 0.4–1.5)
GFR: 61.15 mL/min (ref >60.00)
Sodium: 140 mEq/L (ref 135–145)

## 2012-10-04 LAB — CBC WITH DIFFERENTIAL/PLATELET
Basophils Relative: 0.9 % (ref 0.0–3.0)
Hemoglobin: 14.9 g/dL (ref 13.0–17.0)
Lymphocytes Relative: 26.7 % (ref 12.0–46.0)
Monocytes Relative: 9.9 % (ref 3.0–12.0)
Neutro Abs: 4.1 10*3/uL (ref 1.4–7.7)
Neutrophils Relative %: 57.7 % (ref 43.0–77.0)
RBC: 5.09 Mil/uL (ref 4.22–5.81)
WBC: 7.2 10*3/uL (ref 4.5–10.5)

## 2012-10-04 LAB — HEPATIC FUNCTION PANEL
AST: 18 U/L (ref 0–37)
Albumin: 3.5 g/dL (ref 3.5–5.2)
Alkaline Phosphatase: 70 U/L (ref 39–117)
Bilirubin, Direct: 0.1 mg/dL (ref 0.0–0.3)
Total Protein: 6.8 g/dL (ref 6.0–8.3)

## 2012-10-04 LAB — POCT URINALYSIS DIPSTICK
Ketones, UA: NEGATIVE
Protein, UA: NEGATIVE
Spec Grav, UA: 1.01
Urobilinogen, UA: 0.2
pH, UA: 6.5

## 2012-10-04 LAB — MICROALBUMIN / CREATININE URINE RATIO
Creatinine,U: 115.2 mg/dL
Microalb, Ur: 2.8 mg/dL — ABNORMAL HIGH (ref 0.0–1.9)

## 2012-10-04 LAB — LIPID PANEL
HDL: 44.3 mg/dL (ref >39.00)
Total CHOL/HDL Ratio: 4
VLDL: 21.8 mg/dL (ref 0.0–40.0)

## 2012-10-06 LAB — URINE CULTURE: Colony Count: NO GROWTH

## 2012-10-24 ENCOUNTER — Other Ambulatory Visit: Payer: Self-pay | Admitting: *Deleted

## 2012-10-30 ENCOUNTER — Encounter: Payer: Self-pay | Admitting: Cardiology

## 2012-10-30 ENCOUNTER — Ambulatory Visit (INDEPENDENT_AMBULATORY_CARE_PROVIDER_SITE_OTHER): Payer: Medicare Other | Admitting: Cardiology

## 2012-10-30 VITALS — BP 140/80 | HR 71 | Ht 73.0 in | Wt 305.0 lb

## 2012-10-30 DIAGNOSIS — Z01810 Encounter for preprocedural cardiovascular examination: Secondary | ICD-10-CM | POA: Insufficient documentation

## 2012-10-30 DIAGNOSIS — R0602 Shortness of breath: Secondary | ICD-10-CM | POA: Diagnosis not present

## 2012-10-30 DIAGNOSIS — I10 Essential (primary) hypertension: Secondary | ICD-10-CM

## 2012-10-30 DIAGNOSIS — R0609 Other forms of dyspnea: Secondary | ICD-10-CM | POA: Diagnosis not present

## 2012-10-30 DIAGNOSIS — R079 Chest pain, unspecified: Secondary | ICD-10-CM | POA: Diagnosis not present

## 2012-10-30 DIAGNOSIS — R0600 Dyspnea, unspecified: Secondary | ICD-10-CM

## 2012-10-30 NOTE — Assessment & Plan Note (Signed)
Plan Myoview given chest pain. If negative may proceed with surgery.

## 2012-10-30 NOTE — Assessment & Plan Note (Signed)
Possible pulmonary contribution given occupational history of working in a [employer]. Will plan echocardiogram to assess LV systolic/diastolic function and RV function. May need pulmonary evaluation in the future.

## 2012-10-30 NOTE — Patient Instructions (Addendum)
Your physician recommends that you schedule a follow-up appointment in: AS NEEDED PENDING TEST RESULTS  Your physician has requested that you have en exercise stress myoview. For further information please visit [email or web address]. Please follow instruction sheet, as given.   Your physician has requested that you have an echocardiogram. Echocardiography is a painless test that uses sound waves to create images of your heart. It provides your doctor with information about the size and shape of your heart and how well your heart's chambers and valves are working. This procedure takes approximately one hour. There are no restrictions for this procedure.    

## 2012-10-30 NOTE — Assessment & Plan Note (Signed)
Symptoms somewhat atypical. Plan stress Myoview for risk stratification.

## 2012-10-30 NOTE — Progress Notes (Signed)
HPI: 67 year old male for evaluation of chest tightness. Seen in June with chest tightness and treated for possible pneumonia. Laboratories showed normal liver functions. Normal hemoglobin, BUN and creatinine of 18 and 1.33. TSH 0.746. BNP 19. Over the past 2 years she has noted dyspnea on exertion. This occurs with more moderate activities but not routine activities. No orthopnea or PND. He has chronic lower extremity edema treated with diuretics and compression stockings. He occasionally has tightness in the left chest area. It is not pleuritic, positional, related to food or exertional. It can last 2 hours at a time. No radiation or associated symptoms. Resolves spontaneously. He does not have exertional chest pain. Because of the above we were asked to evaluate.  Current Outpatient Prescriptions  Medication Sig Dispense Refill  . amLODipine (NORVASC) 5 MG tablet Take 1 tablet (5 mg total) by mouth daily.  30 tablet  3  . aspirin 81 MG tablet Take 81 mg by mouth daily.      . Cholecalciferol (VITAMIN D-3) 1000 UNITS CAPS Take 1,000 Units by mouth.      . furosemide (LASIX) 20 MG tablet Take 1 tablet (20 mg total) by mouth daily.  30 tablet  3  . losartan (COZAAR) 100 MG tablet Take 1 tablet (100 mg total) by mouth daily.  90 tablet  3  . tiotropium (SPIRIVA) 18 MCG inhalation capsule Place 18 mcg into inhaler and inhale daily.      . vitamin B-12 (CYANOCOBALAMIN) 1000 MCG tablet Take 1,000 mcg by mouth daily.       No current facility-administered medications for this visit.    No Known Allergies  Past Medical History  Diagnosis Date  . Hyperlipidemia   . Hypertension   . 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 B12 deficiency   . Lower extremity edema     Past Surgical History  Procedure Laterality Date  . Finger surgery      History   Social History  . Marital Status: Married    Spouse Name: N/A    Number of Children: 4  . Years of Education: N/A    Occupational History  . retired    Social History Main Topics  . Smoking status: Never Smoker   . Smokeless tobacco: Not on file  . Alcohol Use: Yes     Comment: Occasional  . Drug Use: No  . Sexual Activity: Yes   Other Topics Concern  . Not on file   Social History Narrative   Travis Daniels lives with his wife in [place]. He is recently retired and relocated to [facility] from [place]. Historically, his medical provider has been [facility] in [place].    Family History  Problem Relation Age of Onset  . Cancer Father     colon and pancreas  . Epilepsy Son   . CAD Mother     MI in her 50s    ROS: chronic lower extremity edema and right shoulder pain but no fevers or chills, productive cough, hemoptysis, dysphasia, odynophagia, melena, hematochezia, dysuria, hematuria, rash, seizure activity, orthopnea, PND, claudication. Remaining systems are negative.  Physical Exam:   Blood pressure 140/80, pulse 71, height 6\' 1"  (1.854 m), weight 305 lb (138.347 kg).  General:  Well developed/well nourished in NAD Skin warm/dry Patient not depressed No peripheral clubbing Back-normal HEENT-normal/normal eyelids Neck supple/normal carotid upstroke bilaterally; no bruits; no JVD; no thyromegaly chest - CTA/ normal expansion CV - RRR/normal S1 and  S2; no rubs or gallops;  PMI nondisplaced, 1/6 systolic ejection murmur. Abdomen -NT/ND, no HSM, no mass, + bowel sounds, no bruit, umbilical hernia 2+ femoral pulses, no bruits Ext-1+ edema, no chords, 2+ DP, chronic skin changes Neuro-grossly nonfocal  ECG sinus rhythm at a rate of 71. Nonspecific lateral ST changes.

## 2012-10-30 NOTE — Assessment & Plan Note (Signed)
Continue present blood pressure medications. 

## 2012-11-12 ENCOUNTER — Ambulatory Visit (HOSPITAL_COMMUNITY): Payer: Medicare Other | Attending: Cardiology | Admitting: Radiology

## 2012-11-12 ENCOUNTER — Encounter (HOSPITAL_COMMUNITY): Payer: Medicare Other

## 2012-11-12 ENCOUNTER — Ambulatory Visit (HOSPITAL_BASED_OUTPATIENT_CLINIC_OR_DEPARTMENT_OTHER): Payer: Medicare Other | Admitting: Radiology

## 2012-11-12 VITALS — Ht 73.0 in | Wt 296.0 lb

## 2012-11-12 DIAGNOSIS — R079 Chest pain, unspecified: Secondary | ICD-10-CM

## 2012-11-12 DIAGNOSIS — R0602 Shortness of breath: Secondary | ICD-10-CM | POA: Insufficient documentation

## 2012-11-12 DIAGNOSIS — I10 Essential (primary) hypertension: Secondary | ICD-10-CM | POA: Diagnosis not present

## 2012-11-12 DIAGNOSIS — R0989 Other specified symptoms and signs involving the circulatory and respiratory systems: Secondary | ICD-10-CM | POA: Insufficient documentation

## 2012-11-12 DIAGNOSIS — R0600 Dyspnea, unspecified: Secondary | ICD-10-CM

## 2012-11-12 DIAGNOSIS — R609 Edema, unspecified: Secondary | ICD-10-CM

## 2012-11-12 DIAGNOSIS — R072 Precordial pain: Secondary | ICD-10-CM | POA: Insufficient documentation

## 2012-11-12 DIAGNOSIS — R0609 Other forms of dyspnea: Secondary | ICD-10-CM | POA: Diagnosis not present

## 2012-11-12 MED ORDER — TECHNETIUM TC 99M SESTAMIBI GENERIC - CARDIOLITE
30.0000 | Freq: Once | INTRAVENOUS | Status: AC | PRN
  Administered 2012-11-12: 30 via INTRAVENOUS

## 2012-11-12 MED ORDER — PERFLUTREN PROTEIN A MICROSPH IV SUSP
3.0000 mL | Freq: Once | INTRAVENOUS | Status: AC
  Administered 2012-11-12: 3 mL via INTRAVENOUS

## 2012-11-12 NOTE — Progress Notes (Signed)
Echocardiogram performed with optison.  

## 2012-11-12 NOTE — Progress Notes (Signed)
[facility] SITE 3 NUCLEAR MED [address] [place], [place] [address] [phone]    Cardiology Nuclear Med Study  Travis Daniels is a 67 y.o. male     MRN : [id number]     DOB: Dec 05, 1945  Procedure Date: 11/12/2012  Nuclear Med Background Indication for Stress Test:  Evaluation for Ischemia, and Surgical Clearance for  (R) Shoulder Surgery by Norlene Campbell, MD History:  No prior known history of CAD Cardiac Risk Factors: Family History - CAD, Hypertension, Lipids and PVD  Symptoms: Chest Pain/Chest Tightness with/without exertion (last occurrence 1 1/2 months ago),  DOE, Fatigue and Fatigue with Exertion   Nuclear Pre-Procedure Caffeine/Decaff Intake:  None > 12 hrs NPO After: 7:30pm   Lungs:  Clear, Proventil Inhaler 2 sprays prior to test O2 Sat: 94-96% on room air. IV 0.9% NS with Angio Cath:  20g  IV Site: R Hand x 1, tolerated well  IV Started by:  Irean Hong, RN  Chest Size (in):  58 Cup Size: n/a  Height: 6\' 1"  (1.854 m)  Weight:  296 lb (134.265 kg)  BMI:  Body mass index is 39.06 kg/(m^2). Tech Comments:  Took Norvasc and Cozaar this am    Nuclear Med Study 1 or 2 day study: 2 day  Stress Test Type:  Stress  Reading MD: Kristeen Miss, MD  Order Authorizing Provider:  Olga Millers, MD  Resting Radionuclide: Technetium 32m Sestamibi  Resting Radionuclide Dose: 33.0 mCi on 11/13/12   Stress Radionuclide:  Technetium 68m Sestamibi  Stress Radionuclide Dose: 33.0 mCi on 11/12/12           Stress Protocol Rest HR: 60 Stress HR: 141  Rest BP: 148/97 Stress BP: 223/103  Exercise Time (min): 5:30 METS: 7.0   Predicted Max HR: 153 bpm % Max HR: 92.16 bpm Rate Pressure Product: 78469   Dose of Adenosine (mg):  n/a Dose of Lexiscan: n/a mg  Dose of Atropine (mg): n/a Dose of Dobutamine: n/a mcg/kg/min (at max HR)  Stress Test Technologist: Irean Hong, RN  Nuclear Technologist:  Domenic Polite, CNMT     Rest Procedure:  Myocardial perfusion  imaging was performed at rest 45 minutes following the intravenous administration of Technetium 36m Sestamibi. Rest ECG: NSR, poor R wave progression. no ST or T wave changes.  Stress Procedure:  The patient exercised on the treadmill utilizing the Bruce Protocol for 5:30 minutes, RPE=15. The patient stopped due to DOE and denied any chest pain. There was a marked hypertensive response to exercise. There was a run of PSVT immediately post exercise. Technetium 60m Sestamibi was injected at peak exercise and myocardial perfusion imaging was performed after a brief delay. Stress ECG: No significant change from baseline ECG  QPS Raw Data Images:  Normal; no motion artifact; normal heart/lung ratio.  There is chest wall attenuation of the lateral wall.  Stress Images:  There is a medium sized, mildly severe along the entire lateral wall with normal uptake in the remaining segments.   Rest Images:  There is a medium sized, mildly severe along the entire lateral wall with normal uptake in the remaining segments. Subtraction (SDS):  No evidence of ischemia. Transient Ischemic Dilatation (Normal <1.22):  n/a Lung/Heart Ratio (Normal <0.45):  0.38  Quantitative Gated Spect Images QGS EDV:  94 ml QGS ESV:  34 ml  Impression Exercise Capacity:  Fair exercise capacity. BP Response:  Hypertensive blood pressure response. Clinical Symptoms:  No significant symptoms noted. ECG Impression:  No significant ST segment change suggestive of ischemia. Comparison with Prior Nuclear Study: No previous nuclear study performed  Overall Impression:  Low risk stress nuclear study .  There is a medium sized area of mild attenuation of the entire lateral wall that is most consistent with  chest wall attenuation.  The lateral wall contracts normally.  .  LV Ejection Fraction: 64%.  LV Wall Motion:  NL LV Function; NL Wall Motion.   Vesta Mixer, Montez Hageman., MD, [facility] 11/13/2012, 4:44 PM Office - [phone] Pager  [phone]

## 2012-11-13 ENCOUNTER — Ambulatory Visit (HOSPITAL_COMMUNITY): Payer: Medicare Other | Attending: Cardiology

## 2012-11-13 DIAGNOSIS — R0989 Other specified symptoms and signs involving the circulatory and respiratory systems: Secondary | ICD-10-CM

## 2012-11-13 MED ORDER — TECHNETIUM TC 99M SESTAMIBI GENERIC - CARDIOLITE
33.0000 | Freq: Once | INTRAVENOUS | Status: AC | PRN
  Administered 2012-11-13: 33 via INTRAVENOUS

## 2012-11-14 ENCOUNTER — Other Ambulatory Visit: Payer: Self-pay | Admitting: *Deleted

## 2012-11-14 MED ORDER — DILTIAZEM HCL ER COATED BEADS 120 MG PO CP24: 120.0000 mg | ORAL_CAPSULE | Freq: Every day | ORAL | Status: DC

## 2012-11-28 DIAGNOSIS — Z23 Encounter for immunization: Secondary | ICD-10-CM | POA: Diagnosis not present

## 2012-12-09 ENCOUNTER — Telehealth: Payer: Self-pay | Admitting: Cardiology

## 2012-12-09 NOTE — Telephone Encounter (Signed)
Returned call to patient's wife she stated Dr.W

## 2012-12-09 NOTE — Telephone Encounter (Signed)
PT CHECKING ON STATUS OF SURGICAL CLEARANCE WITH ORTHOPEDICS/DR Merleen Milliner

## 2012-12-09 NOTE — Telephone Encounter (Signed)
Returned call to patient's wife she stated Dr.Whitfield's office has not received surgical clearance form.Message sent to Dr.Crenshaw's nurse.

## 2012-12-10 NOTE — Telephone Encounter (Signed)
Discussed with dr Jens Som, pt is low risk cardiac wise for surgery. This telephone note will be faxed to dr whitfield's office at [phone]. Placed in medical records for faxing. Pt made aware

## 2012-12-13 ENCOUNTER — Telehealth: Payer: Self-pay | Admitting: Cardiology

## 2012-12-13 NOTE — Telephone Encounter (Signed)
Spoke with pt, aware will refax clearance

## 2012-12-13 NOTE — Telephone Encounter (Signed)
Pt states dr Hoy Register office has not rec fac for surg clearance, can we re fax today?

## 2012-12-16 ENCOUNTER — Telehealth: Payer: Self-pay | Admitting: Cardiology

## 2012-12-16 NOTE — Telephone Encounter (Signed)
Received request from Nurse, documents faxed for surgical clearance. To: [facility] Fax number: [phone] Attention: 12/16/12/KM

## 2012-12-16 NOTE — Telephone Encounter (Signed)
Follow Up  Pt states dr whitfield's office has not rec fac for surg clearance, can we re fax today?  Fax # [phone]

## 2012-12-16 NOTE — Telephone Encounter (Signed)
MR to refax for the 3rd time to the number listed

## 2013-01-10 ENCOUNTER — Encounter (HOSPITAL_COMMUNITY): Payer: Self-pay

## 2013-01-13 NOTE — Pre-Procedure Instructions (Addendum)
Travis Daniels  01/13/2013   Your procedure is scheduled on:  Tuesday, January 21, 2013 at 7:30 AM.   Report to [facility] Entrance "A"  at 5:30 AM.   Call this number if you have problems the morning of surgery: [phone]   Remember:   Do not eat food or drink liquids after midnight Monday, 01/20/13.   Take these medicines the morning of surgery with A SIP OF WATER: diltiazem (CARDIZEM CD),  tiotropium (SPIRIVA)   Stop all Vitamins, Herbal Medications, and Aspirin as of today 01/14/13.     Do not wear jewelry.  Do not wear lotions, powders, or cologne. You may wear deodorant.             Men may shave face and neck.  Do not bring valuables to the hospital.  [facility] is not responsible                  for any belongings or valuables.               Contacts, dentures or bridgework may not be worn into surgery.  Leave suitcase in the car. After surgery it may be brought to your room.  For patients admitted to the hospital, discharge time is determined by your                treatment team.                Special Instructions: Shower using CHG 2 nights before surgery and the night before surgery.  If you shower the day of surgery use CHG.  Use special wash - you have one bottle of CHG for all showers.  You should use approximately 1/3 of the bottle for each shower.   Please read over the following fact sheets that you were given: Pain Booklet, Coughing and Deep Breathing, Blood Transfusion Information and Surgical Site Infection Prevention

## 2013-01-14 ENCOUNTER — Encounter (HOSPITAL_COMMUNITY)
Admission: RE | Admit: 2013-01-14 | Discharge: 2013-01-14 | Disposition: A | Discharge status: Home / Self Care | Payer: Medicare Other | Source: Ambulatory Visit | Attending: Orthopaedic Surgery | Admitting: Orthopaedic Surgery

## 2013-01-14 ENCOUNTER — Encounter (HOSPITAL_COMMUNITY): Payer: Self-pay

## 2013-01-14 DIAGNOSIS — Z01812 Encounter for preprocedural laboratory examination: Secondary | ICD-10-CM | POA: Diagnosis not present

## 2013-01-14 DIAGNOSIS — Z01818 Encounter for other preprocedural examination: Secondary | ICD-10-CM | POA: Diagnosis not present

## 2013-01-14 HISTORY — DX: Chronic obstructive pulmonary disease, unspecified: J44.9

## 2013-01-14 HISTORY — DX: Unspecified osteoarthritis, unspecified site: M19.90

## 2013-01-14 LAB — CBC
HCT: 46.8 % (ref 39.0–52.0)
Hemoglobin: 16.2 g/dL (ref 13.0–17.0)
MCH: 29.5 pg (ref 26.0–34.0)
MCHC: 34.6 g/dL (ref 30.0–36.0)
MCV: 85.1 fL (ref 78.0–100.0)
RBC: 5.5 MIL/uL (ref 4.22–5.81)
RDW: 12.8 % (ref 11.5–15.5)

## 2013-01-14 LAB — COMPREHENSIVE METABOLIC PANEL
AST: 20 U/L (ref 0–37)
Alkaline Phosphatase: 95 U/L (ref 39–117)
BUN: 14 mg/dL (ref 6–23)
CO2: 27 mEq/L (ref 19–32)
Calcium: 9.6 mg/dL (ref 8.4–10.5)
Creatinine, Ser: 1.22 mg/dL (ref 0.50–1.35)
GFR calc Af Amer: 69 mL/min — ABNORMAL LOW (ref >90)
GFR calc non Af Amer: 60 mL/min — ABNORMAL LOW (ref >90)
Glucose, Bld: 117 mg/dL — ABNORMAL HIGH (ref 70–99)
Potassium: 3.9 mEq/L (ref 3.5–5.1)
Total Protein: 7.1 g/dL (ref 6.0–8.3)

## 2013-01-14 LAB — URINALYSIS, ROUTINE W REFLEX MICROSCOPIC
Bilirubin Urine: NEGATIVE
Ketones, ur: NEGATIVE mg/dL
Specific Gravity, Urine: 1.014 (ref 1.005–1.030)
Urobilinogen, UA: 0.2 mg/dL (ref 0.0–1.0)
pH: 5.5 (ref 5.0–8.0)

## 2013-01-14 LAB — PROTIME-INR
INR: 0.95 (ref 0.00–1.49)
Prothrombin Time: 12.5 seconds (ref 11.6–15.2)

## 2013-01-14 LAB — TYPE AND SCREEN: Antibody Screen: NEGATIVE

## 2013-01-14 LAB — URINE MICROSCOPIC-ADD ON

## 2013-01-14 NOTE — Progress Notes (Signed)
01/14/13 1016  OBSTRUCTIVE SLEEP APNEA  Have you ever been diagnosed with sleep apnea through a sleep study? No  Do you snore loudly (loud enough to be heard through closed doors)?  1  Do you often feel tired, fatigued, or sleepy during the daytime? 0  Has anyone observed you stop breathing during your sleep? 1  Do you have, or are you being treated for high blood pressure? 1  BMI more than 35 kg/m2? 1  Age over 67 years old? 1  Neck circumference greater than 40 cm/18 inches? 1 (19)  Gender: 1  Obstructive Sleep Apnea Score 7  Score 4 or greater  Results sent to PCP

## 2013-01-15 DIAGNOSIS — M19019 Primary osteoarthritis, unspecified shoulder: Secondary | ICD-10-CM | POA: Diagnosis not present

## 2013-01-15 LAB — URINE CULTURE
Colony Count: NO GROWTH
Culture: NO GROWTH

## 2013-01-16 ENCOUNTER — Other Ambulatory Visit: Payer: Self-pay

## 2013-01-16 ENCOUNTER — Telehealth: Payer: Self-pay

## 2013-01-16 DIAGNOSIS — G4730 Sleep apnea, unspecified: Secondary | ICD-10-CM

## 2013-01-16 NOTE — H&P (Signed)
CHIEF COMPLAINT:  Painful right shoulder.   HISTORY OF PRESENT ILLNESS:  Travis Daniels is a very pleasant, 67 year old white male who has had particular problems with his right shoulder in the past.  He has been through the [place] system and was told he basically had bone-on-bone osteoarthritis.  He has been tried with several anti-inflammatory medicines and even injections, but nothing is really making much of a difference.  He notes the fact that if he would have been right-handed instead of left handed, he would not be able to function well.  He still, though, is limited because of the loss of function in the right arm being nondominant.  He has trouble sleeping.  He has also noted that he does not have much range of motion as previously noted.  His symptoms are worsening.  He has had multiple evaluations in the [place] system but came to [place] for an independent evaluation.  He was tried with Voltaren gel as well as discussion of other treatment options.  He, unfortunately, continued to have worsening problems because of his lack of motion and the fact that he is having worsening pain and discomfort where his bad days certainly outnumber his good days.  He is less functional.  Difficulty with his ADLs.  Seen today for evaluation.   PAST SURGICAL HISTORY:  Surgery in 2011 for right finger fracture.   MEDICATIONS:  Losartan 100 mg daily, diltiazem 120 mg daily, Lasix 20 mg daily, baby aspirin 81 mg daily, Spiriva hand-held inhaler 18 mcg daily, vitamin D3 1000 IU once daily, vitamin B12 1000 mcg daily.   ALLERGIES:  None known.   FOURTEEN-POINT REVIEW OF SYSTEMS:  Negative except for glasses, COPD of about 3 years, and hypertension for 15 years.   FAMILY HISTORY:  Positive for mother who died at 32 years of age from a massive heart attack and also mini strokes.  Father deceased at age 78 from pancreatic and colon cancer.  He has no brothers or sisters.   SOCIAL HISTORY:  He is a very pleasant 67 year old white  male who is married.  He is retired from a [employer].  He does not smoke.  He does drink occasionally, 1 to 3 drinks.   PHYSICAL EXAMINATION:  Today reveals a 67 year old white male, well developed, well nourished, alert, pleasant, cooperative, in moderate distress secondary to right shoulder pain.  Height 72 inches.  Weight 306 pounds.  BMI 41.5.  Vital signs reveal a temperature of 98.6, pulse 64, respirations 14, and blood pressure 173/106.  Head:  Normocephalic.  Eyes:  Pupils equal, round, and reactive to light and accommodation with extraocular movements intact.  Ears/Nose/Throat:  Benign.  Chest:  Had good expansion.  Lungs:  Essentially clear.  Cardiac:  Had a regular rhythm and rate.  Normal S1, S2.  Distant heart sounds.  He does have maybe a grade 2/6 systolic murmur.  Pulses were trace at the lower extremities and 2+ in the radial artery bilaterally.  Abdomen:  Obese, soft, nontender.  No masses palpable.  Normal bowel sounds present.  CNS:  He is oriented x3, and cranial nerves II through XII grossly intact.  Musculoskeletal:  He has only about 90 to 100 degrees of abduction and volar flexion.  With the arm at 90 degrees, I can get him only around maybe 10 to 20 degrees external rotation.  Marked crepitance with range of motion.  Neurovascularly intact distally.   RADIOGRAPHS:  X-rays on 09/23/2012 reveal  essentially bone-on-bone OA of the right shoulder.  There are some superior spurs and inferior spurs about the humeral head.  He has a type II acromion.   I reviewed a note from Dr. Jens Som who feels that Mr. Ambers is a candidate from a surgical standpoint from cardiac evaluation.  He is cleared for surgery.  I have also reviewed a clearance from Dr. Laury Axon who also agrees he is medically able to sustain total shoulder surgery.   CLINICAL IMPRESSION:  1.  Advanced end-stage bone-on-bone OA of the right shoulder. 2.  History of COPD. 3.  History of  hypertension.   RECOMMENDATIONS:  At this time we feel that he is a candidate for total joint replacement of the shoulder.   This has been reviewed with him in detail, including using a model to illustrate what the surgery is.  Procedure, risks, and benefits fully explained to him and his wife, and they are totally understanding  Travis John D. Aleda Grana [facility] [phone]  01/16/2013 10:36 AM

## 2013-01-16 NOTE — Telephone Encounter (Signed)
Inform pt that Dr Lessie Dings found evidence during surgery of sleep apnea. We are referring to pulm for eval ----- Message ----- From: Georgina Quint, RN Sent: 01/14/2013 10:32 AM To: Lelon Perla, DO Subject: Inpatient Notes     Patient has been made aware and the referral has been put in.        [place]

## 2013-01-20 MED ORDER — ACETAMINOPHEN 500 MG PO TABS
1000.0000 mg | ORAL_TABLET | Freq: Once | ORAL | Status: AC
  Administered 2013-01-21: 1000 mg via ORAL

## 2013-01-20 MED ORDER — SODIUM CHLORIDE 0.9 % IV SOLN: INTRAVENOUS | Status: DC

## 2013-01-20 MED ORDER — DEXTROSE 5 % IV SOLN
3.0000 g | INTRAVENOUS | Status: AC
  Administered 2013-01-21: 3 g via INTRAVENOUS
  Filled 2013-01-20: qty 3000

## 2013-01-21 ENCOUNTER — Inpatient Hospital Stay (HOSPITAL_COMMUNITY)
Admission: RE | Admit: 2013-01-21 | Discharge: 2013-01-23 | DRG: 483 | Disposition: A | Discharge status: Home / Self Care | Payer: Medicare Other | Source: Ambulatory Visit | Attending: Orthopaedic Surgery | Admitting: Orthopaedic Surgery

## 2013-01-21 ENCOUNTER — Inpatient Hospital Stay (HOSPITAL_COMMUNITY): Payer: Medicare Other

## 2013-01-21 ENCOUNTER — Inpatient Hospital Stay (HOSPITAL_COMMUNITY): Payer: Medicare Other | Admitting: Anesthesiology

## 2013-01-21 ENCOUNTER — Encounter (HOSPITAL_COMMUNITY): Payer: Medicare Other | Admitting: Anesthesiology

## 2013-01-21 ENCOUNTER — Encounter (HOSPITAL_COMMUNITY)
Admission: RE | Disposition: A | Discharge status: Home / Self Care | Payer: Self-pay | Source: Ambulatory Visit | Attending: Orthopaedic Surgery

## 2013-01-21 ENCOUNTER — Encounter (HOSPITAL_COMMUNITY): Payer: Self-pay | Admitting: Anesthesiology

## 2013-01-21 DIAGNOSIS — I10 Essential (primary) hypertension: Secondary | ICD-10-CM

## 2013-01-21 DIAGNOSIS — J9620 Acute and chronic respiratory failure, unspecified whether with hypoxia or hypercapnia: Secondary | ICD-10-CM | POA: Diagnosis not present

## 2013-01-21 DIAGNOSIS — R0600 Dyspnea, unspecified: Secondary | ICD-10-CM

## 2013-01-21 DIAGNOSIS — Z800 Family history of malignant neoplasm of digestive organs: Secondary | ICD-10-CM | POA: Diagnosis not present

## 2013-01-21 DIAGNOSIS — G4710 Hypersomnia, unspecified: Secondary | ICD-10-CM | POA: Diagnosis not present

## 2013-01-21 DIAGNOSIS — Z9852 Vasectomy status: Secondary | ICD-10-CM | POA: Diagnosis not present

## 2013-01-21 DIAGNOSIS — Z8249 Family history of ischemic heart disease and other diseases of the circulatory system: Secondary | ICD-10-CM | POA: Diagnosis not present

## 2013-01-21 DIAGNOSIS — E66813 Obesity, class 3: Secondary | ICD-10-CM | POA: Diagnosis present

## 2013-01-21 DIAGNOSIS — G8918 Other acute postprocedural pain: Secondary | ICD-10-CM | POA: Diagnosis not present

## 2013-01-21 DIAGNOSIS — R079 Chest pain, unspecified: Secondary | ICD-10-CM

## 2013-01-21 DIAGNOSIS — G4733 Obstructive sleep apnea (adult) (pediatric): Secondary | ICD-10-CM | POA: Diagnosis present

## 2013-01-21 DIAGNOSIS — J9600 Acute respiratory failure, unspecified whether with hypoxia or hypercapnia: Secondary | ICD-10-CM | POA: Diagnosis not present

## 2013-01-21 DIAGNOSIS — M25519 Pain in unspecified shoulder: Secondary | ICD-10-CM

## 2013-01-21 DIAGNOSIS — E872 Acidosis, unspecified: Secondary | ICD-10-CM | POA: Diagnosis not present

## 2013-01-21 DIAGNOSIS — E662 Morbid (severe) obesity with alveolar hypoventilation: Secondary | ICD-10-CM | POA: Diagnosis present

## 2013-01-21 DIAGNOSIS — J9819 Other pulmonary collapse: Secondary | ICD-10-CM | POA: Diagnosis not present

## 2013-01-21 DIAGNOSIS — J4489 Other specified chronic obstructive pulmonary disease: Secondary | ICD-10-CM | POA: Diagnosis not present

## 2013-01-21 DIAGNOSIS — Z79899 Other long term (current) drug therapy: Secondary | ICD-10-CM

## 2013-01-21 DIAGNOSIS — E785 Hyperlipidemia, unspecified: Secondary | ICD-10-CM | POA: Diagnosis present

## 2013-01-21 DIAGNOSIS — Z7982 Long term (current) use of aspirin: Secondary | ICD-10-CM | POA: Diagnosis not present

## 2013-01-21 DIAGNOSIS — E538 Deficiency of other specified B group vitamins: Secondary | ICD-10-CM | POA: Diagnosis present

## 2013-01-21 DIAGNOSIS — M75101 Unspecified rotator cuff tear or rupture of right shoulder, not specified as traumatic: Secondary | ICD-10-CM

## 2013-01-21 DIAGNOSIS — R0683 Snoring: Secondary | ICD-10-CM

## 2013-01-21 DIAGNOSIS — R0609 Other forms of dyspnea: Secondary | ICD-10-CM | POA: Diagnosis not present

## 2013-01-21 DIAGNOSIS — M25511 Pain in right shoulder: Secondary | ICD-10-CM

## 2013-01-21 DIAGNOSIS — J449 Chronic obstructive pulmonary disease, unspecified: Secondary | ICD-10-CM | POA: Diagnosis not present

## 2013-01-21 DIAGNOSIS — N182 Chronic kidney disease, stage 2 (mild): Secondary | ICD-10-CM | POA: Diagnosis present

## 2013-01-21 DIAGNOSIS — M19019 Primary osteoarthritis, unspecified shoulder: Principal | ICD-10-CM | POA: Diagnosis present

## 2013-01-21 DIAGNOSIS — Z823 Family history of stroke: Secondary | ICD-10-CM | POA: Diagnosis not present

## 2013-01-21 DIAGNOSIS — Z6841 Body Mass Index (BMI) 40.0 and over, adult: Secondary | ICD-10-CM | POA: Diagnosis not present

## 2013-01-21 DIAGNOSIS — Z96619 Presence of unspecified artificial shoulder joint: Secondary | ICD-10-CM | POA: Diagnosis not present

## 2013-01-21 DIAGNOSIS — J9601 Acute respiratory failure with hypoxia: Secondary | ICD-10-CM | POA: Diagnosis not present

## 2013-01-21 DIAGNOSIS — I129 Hypertensive chronic kidney disease with stage 1 through stage 4 chronic kidney disease, or unspecified chronic kidney disease: Secondary | ICD-10-CM | POA: Diagnosis present

## 2013-01-21 DIAGNOSIS — Z951 Presence of aortocoronary bypass graft: Secondary | ICD-10-CM | POA: Diagnosis not present

## 2013-01-21 HISTORY — PX: TOTAL SHOULDER ARTHROPLASTY: SHX126

## 2013-01-21 LAB — BLOOD GAS, ARTERIAL
Acid-Base Excess: 4.4 mmol/L — ABNORMAL HIGH (ref 0.0–2.0)
Drawn by: 244861
Patient temperature: 98.6
TCO2: 33.9 mmol/L (ref 0–100)
pCO2 arterial: 78.2 mmHg (ref 35.0–45.0)
pH, Arterial: 7.229 — ABNORMAL LOW (ref 7.350–7.450)

## 2013-01-21 LAB — MRSA PCR SCREENING: MRSA by PCR: NEGATIVE

## 2013-01-21 LAB — GLUCOSE, CAPILLARY: Glucose-Capillary: 128 mg/dL — ABNORMAL HIGH (ref 70–99)

## 2013-01-21 SURGERY — ARTHROPLASTY, SHOULDER, TOTAL
Anesthesia: General | Site: Shoulder | Laterality: Right | Wound class: Clean

## 2013-01-21 MED ORDER — HYDRALAZINE HCL 20 MG/ML IJ SOLN
10.0000 mg | INTRAMUSCULAR | Status: DC | PRN
  Filled 2013-01-21: qty 1

## 2013-01-21 MED ORDER — ONDANSETRON HCL 4 MG/2ML IJ SOLN
INTRAMUSCULAR | Status: DC | PRN
  Administered 2013-01-21: 4 mg via INTRAVENOUS

## 2013-01-21 MED ORDER — LOSARTAN POTASSIUM 50 MG PO TABS
100.0000 mg | ORAL_TABLET | Freq: Every day | ORAL | Status: DC
  Administered 2013-01-21 – 2013-01-23 (×3): 100 mg via ORAL
  Filled 2013-01-21 (×3): qty 2

## 2013-01-21 MED ORDER — FUROSEMIDE 10 MG/ML IJ SOLN
40.0000 mg | Freq: Three times a day (TID) | INTRAMUSCULAR | Status: AC
  Administered 2013-01-21 – 2013-01-22 (×2): 40 mg via INTRAVENOUS
  Filled 2013-01-21 (×2): qty 4

## 2013-01-21 MED ORDER — MAGNESIUM HYDROXIDE 400 MG/5ML PO SUSP: 30.0000 mL | Freq: Every day | ORAL | Status: DC | PRN

## 2013-01-21 MED ORDER — OXYCODONE HCL 5 MG PO TABS: 5.0000 mg | ORAL_TABLET | Freq: Once | ORAL | Status: DC | PRN

## 2013-01-21 MED ORDER — HYDROMORPHONE HCL PF 1 MG/ML IJ SOLN: 0.2500 mg | INTRAMUSCULAR | Status: DC | PRN

## 2013-01-21 MED ORDER — ONDANSETRON HCL 4 MG PO TABS: 4.0000 mg | ORAL_TABLET | Freq: Four times a day (QID) | ORAL | Status: DC | PRN

## 2013-01-21 MED ORDER — MIDAZOLAM HCL 5 MG/5ML IJ SOLN
INTRAMUSCULAR | Status: DC | PRN
  Administered 2013-01-21: 2 mg via INTRAVENOUS

## 2013-01-21 MED ORDER — CEFAZOLIN SODIUM-DEXTROSE 2-3 GM-% IV SOLR
2.0000 g | Freq: Four times a day (QID) | INTRAVENOUS | Status: AC
  Administered 2013-01-21 – 2013-01-22 (×3): 2 g via INTRAVENOUS
  Filled 2013-01-21 (×5): qty 50

## 2013-01-21 MED ORDER — OXYCODONE HCL 5 MG/5ML PO SOLN: 5.0000 mg | Freq: Once | ORAL | Status: DC | PRN

## 2013-01-21 MED ORDER — LABETALOL HCL 5 MG/ML IV SOLN
5.0000 mg | INTRAVENOUS | Status: DC | PRN
  Administered 2013-01-21: 5 mg via INTRAVENOUS

## 2013-01-21 MED ORDER — METOCLOPRAMIDE HCL 5 MG PO TABS
5.0000 mg | ORAL_TABLET | Freq: Three times a day (TID) | ORAL | Status: DC | PRN
  Filled 2013-01-21: qty 2

## 2013-01-21 MED ORDER — METOCLOPRAMIDE HCL 5 MG/ML IJ SOLN
5.0000 mg | Freq: Three times a day (TID) | INTRAMUSCULAR | Status: DC | PRN
  Filled 2013-01-21: qty 2

## 2013-01-21 MED ORDER — LABETALOL HCL 5 MG/ML IV SOLN
INTRAVENOUS | Status: AC
  Filled 2013-01-21: qty 4

## 2013-01-21 MED ORDER — ONDANSETRON HCL 4 MG/2ML IJ SOLN: 4.0000 mg | Freq: Four times a day (QID) | INTRAMUSCULAR | Status: DC | PRN

## 2013-01-21 MED ORDER — EPHEDRINE SULFATE 50 MG/ML IJ SOLN
INTRAMUSCULAR | Status: DC | PRN
  Administered 2013-01-21: 30 mg via INTRAVENOUS
  Administered 2013-01-21 (×7): 10 mg via INTRAVENOUS

## 2013-01-21 MED ORDER — KETOROLAC TROMETHAMINE 30 MG/ML IJ SOLN
30.0000 mg | Freq: Four times a day (QID) | INTRAMUSCULAR | Status: DC | PRN
  Administered 2013-01-22: 30 mg via INTRAVENOUS
  Filled 2013-01-21: qty 1

## 2013-01-21 MED ORDER — FUROSEMIDE 20 MG PO TABS
20.0000 mg | ORAL_TABLET | Freq: Every day | ORAL | Status: DC
  Administered 2013-01-21: 20 mg via ORAL
  Filled 2013-01-21 (×2): qty 1

## 2013-01-21 MED ORDER — SUCCINYLCHOLINE CHLORIDE 20 MG/ML IJ SOLN
INTRAMUSCULAR | Status: DC | PRN
  Administered 2013-01-21: 180 mg via INTRAVENOUS

## 2013-01-21 MED ORDER — DOCUSATE SODIUM 100 MG PO CAPS
100.0000 mg | ORAL_CAPSULE | Freq: Two times a day (BID) | ORAL | Status: DC
  Administered 2013-01-22 – 2013-01-23 (×3): 100 mg via ORAL
  Filled 2013-01-21 (×4): qty 1

## 2013-01-21 MED ORDER — HYDROMORPHONE HCL PF 1 MG/ML IJ SOLN: 0.5000 mg | INTRAMUSCULAR | Status: DC | PRN

## 2013-01-21 MED ORDER — SODIUM CHLORIDE 0.9 % IR SOLN
Status: DC | PRN
  Administered 2013-01-21: 1000 mL

## 2013-01-21 MED ORDER — ALBUTEROL SULFATE (5 MG/ML) 0.5% IN NEBU
2.5000 mg | INHALATION_SOLUTION | Freq: Once | RESPIRATORY_TRACT | Status: AC
  Administered 2013-01-21: 2.5 mg via RESPIRATORY_TRACT
  Filled 2013-01-21: qty 0.5

## 2013-01-21 MED ORDER — LACTATED RINGERS IV SOLN
INTRAVENOUS | Status: DC | PRN
  Administered 2013-01-21 (×3): via INTRAVENOUS

## 2013-01-21 MED ORDER — DILTIAZEM HCL ER COATED BEADS 120 MG PO CP24
120.0000 mg | ORAL_CAPSULE | Freq: Every day | ORAL | Status: DC
  Administered 2013-01-22 – 2013-01-23 (×2): 120 mg via ORAL
  Filled 2013-01-21 (×2): qty 1

## 2013-01-21 MED ORDER — MEPERIDINE HCL 25 MG/ML IJ SOLN: 6.2500 mg | INTRAMUSCULAR | Status: DC | PRN

## 2013-01-21 MED ORDER — ACETAMINOPHEN 500 MG PO TABS
ORAL_TABLET | ORAL | Status: AC
  Administered 2013-01-21: 1000 mg via ORAL
  Filled 2013-01-21: qty 2

## 2013-01-21 MED ORDER — ASPIRIN 81 MG PO CHEW
81.0000 mg | CHEWABLE_TABLET | Freq: Every day | ORAL | Status: DC
  Administered 2013-01-22 – 2013-01-23 (×2): 81 mg via ORAL
  Filled 2013-01-21 (×3): qty 1

## 2013-01-21 MED ORDER — FLEET ENEMA 7-19 GM/118ML RE ENEM
1.0000 | ENEMA | Freq: Once | RECTAL | Status: AC | PRN
  Filled 2013-01-21: qty 1

## 2013-01-21 MED ORDER — ALBUTEROL SULFATE (5 MG/ML) 0.5% IN NEBU
2.5000 mg | INHALATION_SOLUTION | RESPIRATORY_TRACT | Status: DC
  Administered 2013-01-21 – 2013-01-22 (×5): 2.5 mg via RESPIRATORY_TRACT
  Filled 2013-01-21 (×5): qty 0.5

## 2013-01-21 MED ORDER — OXYCODONE HCL 5 MG PO TABS
5.0000 mg | ORAL_TABLET | ORAL | Status: DC | PRN
  Administered 2013-01-23: 5 mg via ORAL
  Filled 2013-01-21: qty 1

## 2013-01-21 MED ORDER — PROPOFOL 10 MG/ML IV BOLUS
INTRAVENOUS | Status: DC | PRN
  Administered 2013-01-21: 200 mg via INTRAVENOUS

## 2013-01-21 MED ORDER — BISACODYL 10 MG RE SUPP: 10.0000 mg | Freq: Every day | RECTAL | Status: DC | PRN

## 2013-01-21 MED ORDER — METHOCARBAMOL 500 MG PO TABS
500.0000 mg | ORAL_TABLET | Freq: Four times a day (QID) | ORAL | Status: DC | PRN
  Filled 2013-01-21: qty 1

## 2013-01-21 MED ORDER — SODIUM CHLORIDE 0.9 % IV SOLN
INTRAVENOUS | Status: DC
  Administered 2013-01-21: 15:00:00 via INTRAVENOUS

## 2013-01-21 MED ORDER — FENTANYL CITRATE 0.05 MG/ML IJ SOLN
INTRAMUSCULAR | Status: DC | PRN
  Administered 2013-01-21: 100 ug via INTRAVENOUS

## 2013-01-21 MED ORDER — PROMETHAZINE HCL 25 MG/ML IJ SOLN: 6.2500 mg | INTRAMUSCULAR | Status: DC | PRN

## 2013-01-21 MED ORDER — PHENYLEPHRINE HCL 10 MG/ML IJ SOLN
INTRAMUSCULAR | Status: DC | PRN
  Administered 2013-01-21 (×3): 80 ug via INTRAVENOUS

## 2013-01-21 MED ORDER — CHLORHEXIDINE GLUCONATE 4 % EX LIQD: 60.0000 mL | Freq: Every day | CUTANEOUS | Status: DC

## 2013-01-21 MED ORDER — ASPIRIN 81 MG PO TABS: 81.0000 mg | ORAL_TABLET | Freq: Every day | ORAL | Status: DC

## 2013-01-21 MED ORDER — BUPIVACAINE-EPINEPHRINE PF 0.5-1:200000 % IJ SOLN
INTRAMUSCULAR | Status: DC | PRN
  Administered 2013-01-21: 30 mL

## 2013-01-21 MED ORDER — CHLORHEXIDINE GLUCONATE 4 % EX LIQD: 60.0000 mL | Freq: Once | CUTANEOUS | Status: DC

## 2013-01-21 MED ORDER — TIOTROPIUM BROMIDE MONOHYDRATE 18 MCG IN CAPS
18.0000 ug | ORAL_CAPSULE | Freq: Every day | RESPIRATORY_TRACT | Status: DC
  Filled 2013-01-21 (×2): qty 5

## 2013-01-21 MED ORDER — GLYCOPYRROLATE 0.2 MG/ML IJ SOLN
INTRAMUSCULAR | Status: DC | PRN
  Administered 2013-01-21: 0.2 mg via INTRAVENOUS

## 2013-01-21 MED ORDER — METHOCARBAMOL 100 MG/ML IJ SOLN
500.0000 mg | Freq: Four times a day (QID) | INTRAVENOUS | Status: DC | PRN
  Filled 2013-01-21: qty 5

## 2013-01-21 SURGICAL SUPPLY — 58 items
BENZOIN TINCTURE PRP APPL 2/3 (GAUZE/BANDAGES/DRESSINGS) ×2 IMPLANT
BLADE SAW SGTL 83.5X18.5 (BLADE) ×2 IMPLANT
BOWL SMART MIX CTS (DISPOSABLE) IMPLANT
CLEANER TIP ELECTROSURG 2X2 (MISCELLANEOUS) ×2 IMPLANT
CLOTH BEACON ORANGE TIMEOUT ST (SAFETY) ×2 IMPLANT
CLSR STERI-STRIP ANTIMIC 1/2X4 (GAUZE/BANDAGES/DRESSINGS) ×2 IMPLANT
COVER SURGICAL LIGHT HANDLE (MISCELLANEOUS) ×2 IMPLANT
COVER TABLE BACK 60X90 (DRAPES) ×2 IMPLANT
DRAPE INCISE IOBAN 66X45 STRL (DRAPES) ×2 IMPLANT
DRAPE PROXIMA HALF (DRAPES) ×2 IMPLANT
DRSG ADAPTIC 3X8 NADH LF (GAUZE/BANDAGES/DRESSINGS) ×2 IMPLANT
DRSG MEPILEX BORDER 4X8 (GAUZE/BANDAGES/DRESSINGS) ×2 IMPLANT
DRSG PAD ABDOMINAL 8X10 ST (GAUZE/BANDAGES/DRESSINGS) ×2 IMPLANT
DURAPREP 26ML APPLICATOR (WOUND CARE) ×2 IMPLANT
ELECT CAUTERY BLADE 6.4 (BLADE) ×2 IMPLANT
ELECT NEEDLE TIP 2.8 STRL (NEEDLE) IMPLANT
ELECT REM PT RETURN 9FT ADLT (ELECTROSURGICAL) ×2
ELECTRODE REM PT RTRN 9FT ADLT (ELECTROSURGICAL) ×1 IMPLANT
FACESHIELD LNG OPTICON STERILE (SAFETY) ×4 IMPLANT
GLOVE BIOGEL PI IND STRL 8 (GLOVE) ×1 IMPLANT
GLOVE BIOGEL PI IND STRL 8.5 (GLOVE) ×1 IMPLANT
GLOVE BIOGEL PI INDICATOR 8 (GLOVE) ×1
GLOVE BIOGEL PI INDICATOR 8.5 (GLOVE) ×1
GLOVE ECLIPSE 8.0 STRL XLNG CF (GLOVE) ×2 IMPLANT
GLOVE SURG ORTHO 8.5 STRL (GLOVE) ×2 IMPLANT
GOWN PREVENTION PLUS XLARGE (GOWN DISPOSABLE) ×2 IMPLANT
GOWN STRL NON-REIN LRG LVL3 (GOWN DISPOSABLE) ×4 IMPLANT
HANDPIECE INTERPULSE COAX TIP (DISPOSABLE)
HEAD HUMERAL 52MMX18MM (Trauma) ×1 IMPLANT
HUMERAL HEAD 52MMX18MM (Trauma) ×2 IMPLANT
HUMERAL STEM 14MM (Trauma) ×2 IMPLANT
KIT BASIN OR (CUSTOM PROCEDURE TRAY) ×2 IMPLANT
KIT ROOM TURNOVER OR (KITS) ×2 IMPLANT
MANIFOLD NEPTUNE II (INSTRUMENTS) ×2 IMPLANT
NEEDLE 22X1 1/2 (OR ONLY) (NEEDLE) IMPLANT
NS IRRIG 1000ML POUR BTL (IV SOLUTION) ×2 IMPLANT
PACK SHOULDER (CUSTOM PROCEDURE TRAY) ×2 IMPLANT
PAD ARMBOARD 7.5X6 YLW CONV (MISCELLANEOUS) ×4 IMPLANT
PEEK ANCHOR ALLTHREAD 5.5M SZ2 (Anchor) ×4 IMPLANT
SET HNDPC FAN SPRY TIP SCT (DISPOSABLE) IMPLANT
SLING ARM IMMOBILIZER LRG (SOFTGOODS) ×2 IMPLANT
SPECIMEN JAR SMALL (MISCELLANEOUS) IMPLANT
SPONGE GAUZE 4X4 12PLY (GAUZE/BANDAGES/DRESSINGS) ×2 IMPLANT
SPONGE LAP 18X18 X RAY DECT (DISPOSABLE) ×4 IMPLANT
SPONGE LAP 4X18 X RAY DECT (DISPOSABLE) ×8 IMPLANT
SPONGE SCRUB IODOPHOR (GAUZE/BANDAGES/DRESSINGS) ×2 IMPLANT
STAPLER VISISTAT 35W (STAPLE) ×2 IMPLANT
SUCTION FRAZIER TIP 10 FR DISP (SUCTIONS) ×2 IMPLANT
SUT ETHIBOND NAB CT1 #1 30IN (SUTURE) ×14 IMPLANT
SUT MNCRL AB 3-0 PS2 18 (SUTURE) ×2 IMPLANT
SUT VIC AB 0 CT1 27 (SUTURE) ×2
SUT VIC AB 0 CT1 27XBRD ANBCTR (SUTURE) ×2 IMPLANT
SUT VIC AB 2-0 FS1 27 (SUTURE) ×4 IMPLANT
SYR CONTROL 10ML LL (SYRINGE) IMPLANT
TAPE CLOTH SURG 6X10 WHT LF (GAUZE/BANDAGES/DRESSINGS) ×2 IMPLANT
TOWER CARTRIDGE SMART MIX (DISPOSABLE) IMPLANT
TRAY FOLEY CATH 16FRSI W/METER (SET/KITS/TRAYS/PACK) ×2 IMPLANT
WATER STERILE IRR 1000ML POUR (IV SOLUTION) IMPLANT

## 2013-01-21 NOTE — Preoperative (Signed)
Beta Blockers   Reason not to administer Beta Blockers:Not Applicable 

## 2013-01-21 NOTE — Transfer of Care (Signed)
Immediate Anesthesia Transfer of Care Note  Patient: Travis Daniels  Procedure(s) Performed: Procedure(s): SHOULDER HEMI ARTHROPLASTY (Right)  Patient Location: PACU  Anesthesia Type:General  Level of Consciousness: awake, alert  and oriented  Airway & Oxygen Therapy: Patient Spontanous Breathing and Patient connected to face mask oxygen  Post-op Assessment: Report given to PACU RN, Post -op Vital signs reviewed and stable and Patient moving all extremities  Post vital signs: Reviewed and stable  Complications: No apparent anesthesia complications

## 2013-01-21 NOTE — Op Note (Signed)
PATIENT ID:      MICKEY ESGUERRA  MRN:     [id number] DOB/AGE:    1945/11/23 / 67 y.o.       OPERATIVE REPORT    DATE OF PROCEDURE:  01/21/2013       PREOPERATIVE DIAGNOSIS:   RIGHT SHOULDER OSTEOARTHRITIS                                                       Estimated body mass index is 37.99 kg/(m^2) as calculated from the following:   Height as of 01/14/13: 6\' 2"  (1.88 m).   Weight as of 11/12/12: 134.265 kg (296 lb).     POSTOPERATIVE DIAGNOSIS:   RIGHT SHOULDER OSTEOARTHRITIS                                                                     Estimated body mass index is 37.99 kg/(m^2) as calculated from the following:   Height as of 01/14/13: 6\' 2"  (1.88 m).   Weight as of 11/12/12: 134.265 kg (296 lb).     PROCEDURE:  Procedure: HEMIARTHROPLASTY RIGHT SHOULDER     SURGEON:  Norlene Campbell, MD    ASSISTANT:   Jacqualine Code, PA-C   (Present and scrubbed throughout the case, critical for assistance with exposure, retraction, instrumentation, and closure.)          ANESTHESIA: regional and general     DRAINS: none :      TOURNIQUET TIME: * No tourniquets in log *    COMPLICATIONS:  None   CONDITION:  stablePROCEDURE IN [id number]     Cleophas Dunker, Jenina Moening W 01/21/2013, 10:52 AM

## 2013-01-21 NOTE — Progress Notes (Signed)
Report given to Angel Rn.

## 2013-01-21 NOTE — Anesthesia Postprocedure Evaluation (Signed)
  Anesthesia Post-op Note  Patient: Travis Daniels  Procedure(s) Performed: Procedure(s): SHOULDER HEMI ARTHROPLASTY (Right)  Patient Location: PACU  Anesthesia Type:GA combined with regional for post-op pain  Level of Consciousness: awake, alert , oriented and patient cooperative  Airway and Oxygen Therapy: Patient Spontanous Breathing and Patient connected to face mask oxygen  Post-op Pain: none  Post-op Assessment: Post-op Vital signs reviewed, Patient's Cardiovascular Status Stable, Respiratory Function Stable, Patent Airway, No signs of Nausea or vomiting and Pain level controlled  Post-op Vital Signs: Reviewed and stable  Complications: respiratory complications from phrenic nerve palsy with interscalene block are resolving.  Patient is stable on oxygen by simple face mask.  Will monitor in step-down bed over night

## 2013-01-21 NOTE — H&P (Signed)
  The recent History & Physical has been reviewed. I have personally examined the patient today. There is no interval change to the documented History & Physical. The patient would like to proceed with the procedure.  Norlene Campbell W 01/21/2013,  7:14 AM

## 2013-01-21 NOTE — Progress Notes (Signed)
Dr.Jackson notified of tachycardia and hypertension--labetalol odered . Will give and cont to monitor.

## 2013-01-21 NOTE — Progress Notes (Signed)
Pt. Began to feel as if he were "suffocating" on the venti mask at 50%.  Sats did not drop, were 94%.  Breathing became more labored than before and shallow, Lung sounds very diminished.  Rapid Response called, eLink notified.  Received orders from Dr. Delford Field including transfer to ICU.  Dr. Ophelia Charter, on call for [facility], notified of transfer and updated on patient's status.  Pt.'s O2 100% on non-rebreather.   Vivi Martens RN

## 2013-01-21 NOTE — Anesthesia Preprocedure Evaluation (Addendum)
Anesthesia Evaluation  Patient identified by MRN, date of birth, ID band Patient awake    Reviewed: Allergy & Precautions, H&P , NPO status , Patient's Chart, lab work & pertinent test results  Airway Mallampati: II TM Distance: >3 FB Neck ROM: Full    Dental  (+) Dental Advisory Given   Pulmonary COPD (well controlled with spiriva) breath sounds clear to auscultation  Pulmonary exam normal       Cardiovascular hypertension, Pt. on medications + Peripheral Vascular Disease Rhythm:Regular Rate:Normal  8/14 myoview: EF 64%, no ischemia   Neuro/Psych negative neurological ROS     GI/Hepatic negative GI ROS,   Endo/Other  Morbid obesity  Renal/GU negative Renal ROS     Musculoskeletal   Abdominal (+) + obese,   Peds  Hematology   Anesthesia Other Findings   Reproductive/Obstetrics                         Anesthesia Physical Anesthesia Plan  ASA: III  Anesthesia Plan: General   Post-op Pain Management:    Induction: Intravenous  Airway Management Planned: Oral ETT  Additional Equipment:   Intra-op Plan:   Post-operative Plan: Extubation in OR  Informed Consent: I have reviewed the patients History and Physical, chart, labs and discussed the procedure including the risks, benefits and alternatives for the proposed anesthesia with the patient or authorized representative who has indicated his/her understanding and acceptance.   Dental advisory given  Plan Discussed with: CRNA and Surgeon  Anesthesia Plan Comments: (Plan routine monitors, GETA with interscalene block for post op analgesia)        Anesthesia Quick Evaluation

## 2013-01-21 NOTE — Progress Notes (Signed)
eLink Physician-Brief Progress Note Patient Name: Travis Daniels DOB: 1945/12/11 MRN: [id number]  Date of Service  01/21/2013   HPI/Events of Note   ctsp for acute resp distress, now on NRM.  CXR shows pulm edema and low lung volumes  eICU Interventions  tfr to ICU, may need bipap, chk abgs, give lasix and albut neb meds.  Floor CCM MD to see later.   Intervention Category Major Interventions: Respiratory failure - evaluation and management  Shan Levans 01/21/2013, 7:02 PM

## 2013-01-21 NOTE — Progress Notes (Signed)
Utilization review completed.  

## 2013-01-21 NOTE — Consult Note (Signed)
PULMONARY  / CRITICAL CARE MEDICINE  Name: Travis Daniels MRN: [id number] DOB: [birth year]    ADMISSION DATE:  01/21/2013 CONSULTATION DATE:  01/21/2013  REFERRING MD :  Dr. Cleophas Dunker,  PRIMARY SERVICE: Orthopaedics   CHIEF COMPLAINT:  Acute Respiratory Failure   BRIEF PATIENT DESCRIPTION: Pt is a 67 y/o M with PMHx for HTN, COPD secondary to exposure who is s/p R should arthroplasty, developing dyspnea and respiratory distress after extubation.  CCM consulted for impending respiratory failure.   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  Pt originally received pre-operative clearance from both his PCP and cardiology, in which he had a 2 D Echo and Myoview showing EF > 65% on both studies.  Pt denies recent illness, but does state he has chronic SOB, especially with ambulation.  Pt tolerated OR well, however, he did have residual phrenic nerve disruption secondary to interscalene block.  Pt continued to have SOB despite on venti mask.  He denies any fever, chills, recent travel, sick contacts, chest pain.    PAST MEDICAL HISTORY :  Past Medical History  Diagnosis Date  . Hyperlipidemia   . Hypertension   . 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 B12 deficiency   . Lower extremity edema   . COPD (chronic obstructive pulmonary disease)     ?  Marland Kitchen Arthritis    Past Surgical History  Procedure Laterality Date  . Finger surgery Right 11    rt middle finger  . Vasectomy     Prior to Admission medications   Medication Sig Start Date End Date Taking? Authorizing Provider   aspirin 81 MG tablet Take 81 mg by mouth daily.   Yes Historical Provider, MD  Cholecalciferol (VITAMIN D-3) 1000 UNITS CAPS Take 1,000 Units by mouth.   Yes Historical Provider, MD  diltiazem (CARDIZEM CD) 120 MG 24 hr capsule Take 1 capsule (120 mg total) by mouth daily. 11/14/12  Yes Lewayne Bunting, MD  furosemide (LASIX) 20 MG tablet Take 1 tablet (20 mg total) by mouth daily. 09/30/12  Yes Yvonne R Lowne, DO  losartan (COZAAR) 100 MG tablet Take 1 tablet (100 mg total) by mouth daily. 09/30/12  Yes Yvonne R Lowne, DO  tiotropium (SPIRIVA) 18 MCG inhalation capsule Place 18 mcg into inhaler and inhale daily.   Yes Historical Provider, MD  vitamin B-12 (CYANOCOBALAMIN) 1000 MCG tablet Take 1,000 mcg by mouth daily.   Yes Historical Provider, MD  naproxen sodium (ANAPROX) 220 MG tablet Take 220 mg by mouth as needed (x2).    Historical Provider, MD   No Known Allergies  FAMILY HISTORY:  Family History  Problem Relation Age of Onset  . Cancer Father     colon and pancreas  . Epilepsy Son   . CAD Mother     MI in her 68s   SOCIAL HISTORY:  reports that he has never smoked. He does not have any smokeless tobacco history on file. He reports that he drinks alcohol. He reports that he does not use illicit drugs.  REVIEW  OF SYSTEMS:  11 point ROS reviewed and negative except for HPI  SUBJECTIVE:  Pt feeling extremely SOB s/p extubation, baseline does have dyspnea at rest.   VITAL SIGNS: Temp:  [98.4 F (36.9 C)-98.7 F (37.1 C)] 98.7 F (37.1 C) (11/11 1330) Pulse Rate:  [51-106] 95 (11/11 1350) Resp:  [20-29] 21 (11/11 1350) BP: (142-173)/(89-105) 155/97 mmHg (11/11 1350) SpO2:  [92 %-98 %] 98 % (11/11 1350) FiO2 (%):  [50 %] 50 % (11/11 1350) Weight:  [306 lb 14.1 oz (139.2 kg)] 306 lb 14.1 oz (139.2 kg) (11/11 1350) HEMODYNAMICS:   VENTILATOR SETTINGS: Vent Mode:  [-]  FiO2 (%):  [50 %] 50 % INTAKE / OUTPUT: Intake/Output     11/11 0701 - 11/12 0700   I.V. (mL/kg) 2850  (20.5)   Total Intake(mL/kg) 2850 (20.5)   Urine (mL/kg/hr) 700 (0.4)   Blood 50 (0)   Total Output 750   Net +2100         PHYSICAL EXAMINATION: General:  Mild distress, Obese Male Neuro:  No focal deficits  HEENT:  NRB in place Neck: + Accessory muscle use  Cardiovascular:  RRR, no murmurs appreciated Lungs:  + Bibasilar Rales, decreased BS B/L, + increased WOB, no wheezing appreciated  Abdomen:  Soft, NT/ND, + abdominal breathing  Musculoskeletal:  + R shoulder sling Skin:  No rashes/erythema   LABS:  Recent Labs Lab 01/21/13 1843  PHART 7.229*  PCO2ART 78.2*  PO2ART 175.0*   No results found for this basename: GLUCAP,  in the last 168 hours  CXR: Bibiasilar Atelectasis, poor lung volumes, pulmonary edema   ASSESSMENT / PLAN:  PULMONARY A: Acute Respiratory Failure Acute on Chronic Respiratory Acidosis  Pulmonary Edema L hemidiaphragm paralysis? Hx of COPD Hx of occupational exposure  P:   Start BiPAP for at least 4 hours, overnight if he tolerates Albuterol PRN  Lasix  IS when tolerated Repeat CXR in AM   CARDIOVASCULAR A: Acute CHF? HTN P:  Lasix 40 mg IV q 8 hrs BNP, troponin, EKG evaluate for ischemia  Hydralazine PRN SBP > 160    RENAL A:  CKD stage II (creatinine 1.2-1.4) P:   Repeat BMET in AM   GASTROINTESTINAL A:  No acute issues P:   Diet as tolerated   HEMATOLOGIC A:  DVT Px P:  Per Primary, SCDs CBC in AM   INFECTIOUS A: Operative Px P:   Ancef per primary   ENDOCRINE A:  No acute issues    P:     NEUROLOGIC A:  Post Op Pain P:   Per Primary   TODAY'S SUMMARY: Acute Respiratory distress, secondary to fluid overload?  Lasix and BiPap as needed   I have personally obtained a history, examined the patient, evaluated laboratory and imaging results, formulated the assessment and plan and placed orders. CRITICAL CARE: The patient is critically ill with multiple organ systems failure and requires high complexity  decision making for assessment and support, frequent evaluation and titration of therapies, application of advanced monitoring technologies and extensive interpretation of multiple databases. Critical Care Time devoted to patient care services described in this note is 45 minutes.    Twana First Hess, DO of [facility] 01/21/2013, 7:04 PM  Attending:  I have seen and examined the patient with nurse practitioner/resident and agree with and have edited the note above.   Yolonda Kida PCCM Pager: [phone] Cell: [phone] If no response, call [phone]

## 2013-01-21 NOTE — Anesthesia Procedure Notes (Signed)
Anesthesia Regional Block:  Interscalene brachial plexus block  Pre-Anesthetic Checklist: ,, timeout performed, Correct Patient, Correct Site, Correct Laterality, Correct Procedure, Correct Position, site marked, Risks and benefits discussed,  Surgical consent,  Pre-op evaluation,  At surgeon's request and post-op pain management  Laterality: Right  Prep: chloraprep       Needles:  Injection technique: Single-shot  Needle Type: Other     Needle Length:cm 9 cm Needle Gauge: 21 and 21 G  Needle insertion depth: 5 cm   Additional Needles:  Procedures: nerve stimulator Interscalene brachial plexus block Narrative:  Start time: 01/21/2013 7:20 AM End time: 01/21/2013 7:30 AM Injection made incrementally with aspirations every 5 mL.  Performed by: Personally   Additional Notes: Monitors applied. Patient sedated. Sterile prep and drape,hand hygiene and sterile gloves were used. Needle position confirmed with evoked response at 0.4 mV.Local anesthetic injected incrementally after negative aspiration.Vascular puncture avoided. No complications. The patient tolerated the procedure well.       Interscalene brachial plexus block

## 2013-01-21 NOTE — Progress Notes (Signed)
Pt. Admitted to rm. 3s01.  VSS, patient alert and oriented.  Pt. On venti mask 50%, O2 sats 95 %.    Vivi Martens RN

## 2013-01-21 NOTE — Progress Notes (Signed)
Spoke with Dr Krista Blue re: pts blood pressure.  BP elevated at pre admit appointment and then when checked this am his left arm gave 175/116 and 194/110 and then his right arm checked at 170/103.  Pt took his morning cardizem and cozaar at 0415.  Advised that anesthesia will have to evaluated when seen this morning

## 2013-01-21 NOTE — Significant Event (Signed)
Rapid Response Event Note  Overview:  Called to assist with patient with resp difficulty - advised RN Morrie Sheldon to have elink camera in and RRT would be on the way with RT. Time Called: 1835 Arrival Time: 1838 Event Type: Respiratory  Initial Focused Assessment:  On arrival - patient sitting upright in bed - alert - able to speak in full sentences - warm and moist - pale - increased WOB with abdominal breathing - using some accessory muscles - wife states he has become more labored.  Abd large slightly firm.  Very little air movement heard.  Patient stated he felt like he was smothering.  Moderate distress.  BP 198/126 HR ST 123  RR 38 -42 O2 sats 100% on NRB mask.  With chart review patient had nerve block for right shoulder replacement today with phrenic nerve involvement - had urgent intubation for OR post block.   Interventions:  Stat ABG, PCXR and Albuterol treatment per Dr. Delford Field orders.  No real change - patient states it is about the same.  Dr. Paulina Fusi - CCM resident present.  Transferred to 2M06 as ordered.  Handoff to [employer].  Wife updated.    Event Summary: Name of Physician Notified: Dr. Cleophas Dunker at  (pta)  Name of Consulting Physician Notified: Dr. Delford Field at [phone]  Outcome: Transferred (Comment)  Event End Time: 9811  [place]

## 2013-01-22 ENCOUNTER — Encounter (HOSPITAL_COMMUNITY): Payer: Self-pay | Admitting: Orthopaedic Surgery

## 2013-01-22 DIAGNOSIS — R0609 Other forms of dyspnea: Secondary | ICD-10-CM

## 2013-01-22 DIAGNOSIS — R0989 Other specified symptoms and signs involving the circulatory and respiratory systems: Secondary | ICD-10-CM

## 2013-01-22 DIAGNOSIS — J9600 Acute respiratory failure, unspecified whether with hypoxia or hypercapnia: Secondary | ICD-10-CM | POA: Diagnosis not present

## 2013-01-22 DIAGNOSIS — I10 Essential (primary) hypertension: Secondary | ICD-10-CM | POA: Diagnosis not present

## 2013-01-22 DIAGNOSIS — J449 Chronic obstructive pulmonary disease, unspecified: Secondary | ICD-10-CM | POA: Diagnosis not present

## 2013-01-22 LAB — CBC WITH DIFFERENTIAL/PLATELET
Basophils Absolute: 0 10*3/uL (ref 0.0–0.1)
Eosinophils Absolute: 0 10*3/uL (ref 0.0–0.7)
Eosinophils Relative: 0 % (ref 0–5)
HCT: 41.6 % (ref 39.0–52.0)
Lymphocytes Relative: 15 % (ref 12–46)
Lymphs Abs: 1.1 10*3/uL (ref 0.7–4.0)
MCV: 87.2 fL (ref 78.0–100.0)
Neutro Abs: 5.1 10*3/uL (ref 1.7–7.7)
Neutrophils Relative %: 70 % (ref 43–77)
Platelets: 132 10*3/uL — ABNORMAL LOW (ref 150–400)
RBC: 4.77 MIL/uL (ref 4.22–5.81)
RDW: 13 % (ref 11.5–15.5)
WBC: 7.3 10*3/uL (ref 4.0–10.5)

## 2013-01-22 LAB — COMPREHENSIVE METABOLIC PANEL
ALT: 10 U/L (ref 0–53)
Albumin: 2.8 g/dL — ABNORMAL LOW (ref 3.5–5.2)
Alkaline Phosphatase: 75 U/L (ref 39–117)
BUN: 15 mg/dL (ref 6–23)
CO2: 31 mEq/L (ref 19–32)
Creatinine, Ser: 1.41 mg/dL — ABNORMAL HIGH (ref 0.50–1.35)
GFR calc Af Amer: 58 mL/min — ABNORMAL LOW (ref >90)
Glucose, Bld: 112 mg/dL — ABNORMAL HIGH (ref 70–99)
Sodium: 139 mEq/L (ref 135–145)
Total Protein: 5.9 g/dL — ABNORMAL LOW (ref 6.0–8.3)

## 2013-01-22 LAB — GLUCOSE, CAPILLARY
Glucose-Capillary: 106 mg/dL — ABNORMAL HIGH (ref 70–99)
Glucose-Capillary: 107 mg/dL — ABNORMAL HIGH (ref 70–99)
Glucose-Capillary: 121 mg/dL — ABNORMAL HIGH (ref 70–99)
Glucose-Capillary: 92 mg/dL (ref 70–99)

## 2013-01-22 MED ORDER — HEPARIN SODIUM (PORCINE) 5000 UNIT/ML IJ SOLN
5000.0000 [IU] | Freq: Three times a day (TID) | INTRAMUSCULAR | Status: DC
  Administered 2013-01-22 – 2013-01-23 (×3): 5000 [IU] via SUBCUTANEOUS
  Filled 2013-01-22 (×6): qty 1

## 2013-01-22 MED ORDER — ALBUTEROL SULFATE (5 MG/ML) 0.5% IN NEBU: 2.5000 mg | INHALATION_SOLUTION | RESPIRATORY_TRACT | Status: DC | PRN

## 2013-01-22 MED ORDER — ALBUTEROL SULFATE (5 MG/ML) 0.5% IN NEBU
2.5000 mg | INHALATION_SOLUTION | Freq: Four times a day (QID) | RESPIRATORY_TRACT | Status: DC
  Administered 2013-01-23: 2.5 mg via RESPIRATORY_TRACT
  Filled 2013-01-22: qty 0.5

## 2013-01-22 NOTE — Progress Notes (Signed)
[name], male   DOB: May 07, 1945, 67 y.o.   MRN: [id number] [name]        MRN:  [id number]          DOB/AGE: [birth year] / 67 y.o.    Norlene Campbell, MD   Jacqualine Code, PA-C [address] [place], [place]  [address]                             [phone]   PROGRESS NOTE  Subjective:  negative for Chest Pain  positive for Shortness of Breath  negative for Nausea/Vomiting   negative for Calf Pain    Tolerating Diet: yes         Patient reports pain as mild.     Pt initially sent to step down unit from PACU but with respiratory concern transferred to MICU. On BPAP all night with sats at 98-100%. Will try nasal cannula before considering transfer to floor. Has not eaten yet, but without nausea.Awake, alert and relatively comfortable  Objective: Vital signs in last 24 hours:   Patient Vitals for the past 24 hrs:  BP Temp Temp src Pulse Resp SpO2 Height Weight  01/22/13 0600 126/73 mmHg - - 91 19 100 % - -  01/22/13 0500 124/70 mmHg - - 86 20 99 % - -  01/22/13 0400 125/76 mmHg - - 88 23 100 % - -  01/22/13 0346 - 99.3 F (37.4 C) Axillary - - - - -  01/22/13 0300 135/77 mmHg - - 88 17 100 % - -  01/22/13 0200 133/77 mmHg - - 90 17 100 % - -  01/22/13 0100 128/73 mmHg - - 88 19 99 % - -  01/22/13 0009 - 100.9 F (38.3 C) Axillary - - - - -  01/22/13 0000 138/77 mmHg - - 91 19 100 % - -  01/21/13 2352 141/81 mmHg - - 91 17 99 % - -  01/21/13 2300 136/77 mmHg - - 93 19 99 % - -  01/21/13 2200 141/82 mmHg - - 92 20 99 % - -  01/21/13 2100 152/93 mmHg - - 96 19 99 % - -  01/21/13 2015 160/119 mmHg - - 109 29 97 % - -  01/21/13 2000 149/88 mmHg - - 100 21 97 % - -  01/21/13 1350 155/97 mmHg - - 95 21 98 % 6' 2.02" (1.88 m) 139.2 kg (306 lb 14.1 oz)  01/21/13 1330 - 98.7 F (37.1 C) - - - - - -  01/21/13 1320 143/91 mmHg - - 90 26 92 % - -  01/21/13 1305 142/91 mmHg - - 86 24 97 % - -  01/21/13 1300 - - - 86 26 97 % - -  01/21/13 1250  144/89 mmHg - - 84 25 97 % - -  01/21/13 1235 143/91 mmHg - - 81 25 97 % - -  01/21/13 1220 162/93 mmHg - - 100 24 97 % - -  01/21/13 1205 166/95 mmHg - - 101 24 97 % - -  01/21/13 1152 160/101 mmHg - - 103 25 97 % - -  01/21/13 1150 173/97 mmHg - - 103 25 97 % - -  01/21/13 1145 161/94 mmHg - - 105 22 97 % - -  01/21/13 1137 161/94 mmHg - - 105 27 98 % - -  01/21/13 1135 159/105 mmHg 98.6 F (37 C) - 106  29 98 % - -      Intake/Output from previous day:   February 10, 2023 0701 - 11/12 0700 In: 3762.5 [I.V.:3612.5] Out: 3600 [Urine:3550]   Intake/Output this shift:       Intake/Output     Feb 10, 2023 0701 - 11/12 0700 11/12 0701 - 11/13 0700   I.V. (mL/kg) 3612.5 (26)    IV Piggyback 150    Total Intake(mL/kg) 3762.5 (27)    Urine (mL/kg/hr) 3550 (1.1)    Blood 50 (0)    Total Output 3600     Net +162.5             LABORATORY DATA:  Recent Labs  01/22/13 0330  WBC 7.3  HGB 14.0  HCT 41.6  PLT 132*    Recent Labs  01/22/13 0330  NA 139  K 3.9  CL 100  CO2 31  BUN 15  CREATININE 1.41*  GLUCOSE 112*  CALCIUM 8.4   Lab Results  Component Value Date   INR 0.95 01/14/2013    Recent Radiographic Studies :  Dg Chest Port 1 View  February 09, 2013   CLINICAL DATA:  Respiratory distress.  EXAM: PORTABLE CHEST - 1 VIEW  COMPARISON:  09/17/2012.  FINDINGS: Very poor inspiration. Stable elevation of the left hemidiaphragm. Mild atelectasis at both lung bases. Borderline enlarged cardiac silhouette, accentuated by the poor inspiration. Thoracic spine degenerative changes and right humeral head prosthesis.  IMPRESSION: Poor inspiration with mild bibasilar atelectasis.   Electronically Signed   By: Gordan Payment M.D.   On: 09-Feb-2013 19:14   Dg Shoulder Right Port  02-09-2013   CLINICAL DATA:  Postop right shoulder hemiarthroplasty.  EXAM: PORTABLE RIGHT SHOULDER - 2+ VIEW  COMPARISON:  Chest radiograph 09/17/2012  FINDINGS: There are immediate postoperative changes of right shoulder  hemiarthroplasty. The hardware appears intact. There is no evidence of periprosthetic fracture. Expected gas is seen within the adjacent soft tissues of the arm. There degenerative changes of the acromioclavicular joint.  IMPRESSION: Postoperative changes of right shoulder hemiarthroplasty. No complicating feature identified.   Electronically Signed   By: Britta Mccreedy M.D.   On: February 09, 2013 14:28     Examination:  General appearance: alert, cooperative and no distress  Wound Exam: clean, dry, intact -small amount of dried blood on upper dressing  Drainage:  None: wound tissue dry  Motor Exam: Pinch and Wrist Dorsiflexion Intact  Sensory Exam: Radial, Ulnar and Median normal-on BPAP and exam somewhat difficult but appears intact  Vascular Exam: Normal  Assessment:    1 Day Post-Op  Procedure(s) (LRB): SHOULDER HEMI ARTHROPLASTY (Right)  ADDITIONAL DIAGNOSIS:  Active Problems:   Dyspnea   Acute respiratory failure with hypoxia   COPD (chronic obstructive pulmonary disease)   Hypertension  respiratry compromise being monitored   Plan: Occupational Therapy as ordered Weight Bearing as Tolerated (WBAT)  DVT Prophylaxis:  Aspirin  DISCHARGE PLAN: Home  DISCHARGE NEEDS: HHPT Monitor respiratory status and consider transfer to floor, otherwise stable, post op films with nice position of components        Travis Daniels 01/22/2013 7:31 AM

## 2013-01-22 NOTE — Progress Notes (Signed)
UR Completed.  Travis Daniels [phone] 01/22/2013

## 2013-01-22 NOTE — Progress Notes (Signed)
Chaplain offered emotional and spiritual support to pt and family member. Pt said, "I'm just ready to go home." They did not desire chaplain support at this time, but appreciated the availability if they need it.   Maurene Capes [phone]

## 2013-01-22 NOTE — Care Management Note (Signed)
    Page 1 of 1   01/22/2013     1:38:38 PM   CARE MANAGEMENT NOTE 01/22/2013  Patient:  LASTER, APPLING   Account Number:  [id number]  Date Initiated:  01/22/2013  Documentation initiated by:  Avie Arenas  Subjective/Objective Assessment:   Admitted for shoulder arthroplasty - resp failure - requiring bipap at this time.     Action/Plan:   Anticipated DC Date:  01/24/2013   Anticipated DC Plan:  HOME W HOME HEALTH SERVICES      DC Planning Services  CM consult      Choice offered to / List presented to:             Status of service:  In process, will continue to follow Medicare Important Message given?   (If response is "NO", the following Medicare IM given date fields will be blank) Date Medicare IM given:   Date Additional Medicare IM given:    Discharge Disposition:    Per UR Regulation:  Reviewed for med. necessity/level of care/duration of stay  If discussed at Long Length of Stay Meetings, dates discussed:    Comments:  ContactSaurabh, [place] [phone]  01-22-13 12noon - Avie Arenas, RNBSN [phone] Talked with patient in room.  Sitting up in chair, on nasal cannula oxygen. Verifies lives with wife - she will be there on discharge. Awaiting PT/OT consults for recommedations.  patient does not feel need for [facility] or equpiment at this time.

## 2013-01-22 NOTE — Progress Notes (Signed)
Foley discontinued @ 0800, no void noted. No complaints of abd discomforts or pressure.MD made .Bladder scanned for 118cc.. Will continue to monitor.

## 2013-01-22 NOTE — Progress Notes (Signed)
PULMONARY  / CRITICAL CARE MEDICINE  Name: Travis Daniels MRN: [id number] DOB: [birth year]    ADMISSION DATE:  01/21/2013 CONSULTATION DATE:  01/22/2013  REFERRING MD :  Dr. Cleophas Dunker PRIMARY SERVICE: Orthopaedics   CHIEF COMPLAINT:  Acute Respiratory Failure   BRIEF PATIENT DESCRIPTION: Pt is a 67 y/o M with PMHx for HTN, COPD secondary to exposure who is s/p R should arthroplasty, developing dyspnea and respiratory distress after extubation.  CCM consulted for impending respiratory failure.   SIGNIFICANT EVENTS / STUDIES:  11/11 >> OR for R shoulder surgery 11/11 >> Respiratory distress, CXR showing decreased lung volumes B/L, pulmonary edema 11/11 >> Started on BiPAP 11/12- improved after lasix  LINES / TUBES: None  CULTURES: None   ANTIBIOTICS: None  SUBJECTIVE:  Pt tolerated BiPAP overnight, feeling slightly better today on [place] in no distress.   VITAL SIGNS: Temp:  [98.6 F (37 C)-100.9 F (38.3 C)] 99.3 F (37.4 C) (11/12 0346) Pulse Rate:  [53-109] 91 (11/12 0600) Resp:  [17-29] 19 (11/12 0600) BP: (124-173)/(70-119) 126/73 mmHg (11/12 0600) SpO2:  [92 %-100 %] 100 % (11/12 0600) FiO2 (%):  [40 %-50 %] 40 % (11/12 0400) Weight:  [306 lb 14.1 oz (139.2 kg)] 306 lb 14.1 oz (139.2 kg) (11/11 1350) HEMODYNAMICS:   VENTILATOR SETTINGS: Vent Mode:  [-]  FiO2 (%):  [40 %-50 %] 40 % INTAKE / OUTPUT: Intake/Output     11/11 0701 - 11/12 0700 11/12 0701 - 11/13 0700   I.V. (mL/kg) 3612.5 (26)    IV Piggyback 150    Total Intake(mL/kg) 3762.5 (27)    Urine (mL/kg/hr) 3550 (1.1)    Blood 50 (0)    Total Output 3600     Net +162.5            PHYSICAL EXAMINATION: General:  NAD Neuro:  No focal deficits  HEENT:  [place] in place  Cardiovascular:  RRR, no murmurs appreciated Lungs: Minimal rales bibasilar, decreased BS B/L  Abdomen:  Soft, NT/ND, + abdominal breathing  Musculoskeletal:  + R shoulder sling Skin:  No rashes/erythema   LABS:  Recent Labs Lab  01/21/13 1843 01/21/13 2130 01/22/13 0330  HGB  --   --  14.0  WBC  --   --  7.3  PLT  --   --  132*  NA  --   --  139  K  --   --  3.9  CL  --   --  100  CO2  --   --  31  GLUCOSE  --   --  112*  BUN  --   --  15  CREATININE  --   --  1.41*  CALCIUM  --   --  8.4  AST  --   --  21  ALT  --   --  10  ALKPHOS  --   --  75  BILITOT  --   --  0.4  PROT  --   --  5.9*  ALBUMIN  --   --  2.8*  TROPONINI  --  <0.30  --   PROBNP  --  331.4*  --   PHART 7.229*  --   --   PCO2ART 78.2*  --   --   PO2ART 175.0*  --   --     Recent Labs Lab 01/21/13 1910 01/22/13 0009 01/22/13 0346  GLUCAP 128* 121* 107*    CXR: Poor lung volumes  ASSESSMENT / PLAN:  PULMONARY A: Acute Respiratory Failure Acute on Chronic Respiratory Acidosis  Pulmonary Edema Likely all though not overt, HTN driven L hemidiaphragm paralysis? Hx of COPD Hx of occupational exposure  P:   Continue on O2 for O2 > 92%  Albuterol PRN  IS  May need even balance goals today, crt rise pcxr in am  Keep MAp controlled, see cvs  CARDIOVASCULAR A: Acute CHF? HTN crisis P:  BNP mildly elevated. Trop and EKG negative for acute changes   Hydralazine PRN SBP > 160  Consider repeat Echo Goal MAP of 30% reduction from highest MAP on admission, sys less 140  Home regimen started May need to hold arb if crt rise  RENAL A:  CKD stage II (creatinine 1.2-1.4), likely some overdiuresis (bicarb 31, na wnl) P:   Hold lasix crt bump Consider restart in am  May need to hold arb Chem in am kvo  GASTROINTESTINAL A:  No acute issues P:   Diet as tolerated   HEMATOLOGIC A:  DVT Px P:  Per Primary, SCDs  INFECTIOUS A: Operative Px P:   Ancef per primary   ENDOCRINE A:  No acute issues    P:  cbg with am labs   NEUROLOGIC A:  Post Op Pain P:   Per Primary   Send to floor, tele pccm to see again in am for medical issues   Bryan R. Hess, DO of [facility] 01/22/2013, 7:29  AM  I have fully examined this patient and agree with above findings.    And edite dinfull  Mcarthur Rossetti. Tyson Alias, MD, FACP Pgr: [phone] [place] [facility]

## 2013-01-22 NOTE — Progress Notes (Signed)
  Echocardiogram 2D Echocardiogram has been performed.  Georgian Co 01/22/2013, 1:26 PM

## 2013-01-23 ENCOUNTER — Inpatient Hospital Stay (HOSPITAL_COMMUNITY): Payer: Medicare Other

## 2013-01-23 DIAGNOSIS — G4733 Obstructive sleep apnea (adult) (pediatric): Secondary | ICD-10-CM

## 2013-01-23 DIAGNOSIS — J9819 Other pulmonary collapse: Secondary | ICD-10-CM | POA: Diagnosis not present

## 2013-01-23 DIAGNOSIS — M19019 Primary osteoarthritis, unspecified shoulder: Secondary | ICD-10-CM | POA: Diagnosis present

## 2013-01-23 DIAGNOSIS — G4710 Hypersomnia, unspecified: Secondary | ICD-10-CM

## 2013-01-23 LAB — CBC WITH DIFFERENTIAL/PLATELET
Basophils Absolute: 0 10*3/uL (ref 0.0–0.1)
Basophils Relative: 0 % (ref 0–1)
Eosinophils Absolute: 0.1 10*3/uL (ref 0.0–0.7)
Eosinophils Relative: 1 % (ref 0–5)
Lymphocytes Relative: 19 % (ref 12–46)
MCHC: 33.7 g/dL (ref 30.0–36.0)
MCV: 86.9 fL (ref 78.0–100.0)
Monocytes Absolute: 1.4 10*3/uL — ABNORMAL HIGH (ref 0.1–1.0)
Platelets: 161 10*3/uL (ref 150–400)
RDW: 13.2 % (ref 11.5–15.5)
WBC: 9.7 10*3/uL (ref 4.0–10.5)

## 2013-01-23 LAB — COMPREHENSIVE METABOLIC PANEL
ALT: 7 U/L (ref 0–53)
Albumin: 3 g/dL — ABNORMAL LOW (ref 3.5–5.2)
Alkaline Phosphatase: 75 U/L (ref 39–117)
BUN: 26 mg/dL — ABNORMAL HIGH (ref 6–23)
Chloride: 97 mEq/L (ref 96–112)
Glucose, Bld: 106 mg/dL — ABNORMAL HIGH (ref 70–99)
Potassium: 4.1 mEq/L (ref 3.5–5.1)
Sodium: 136 mEq/L (ref 135–145)
Total Bilirubin: 0.6 mg/dL (ref 0.3–1.2)
Total Protein: 6.7 g/dL (ref 6.0–8.3)

## 2013-01-23 MED ORDER — ALBUTEROL SULFATE (5 MG/ML) 0.5% IN NEBU: 2.5000 mg | INHALATION_SOLUTION | RESPIRATORY_TRACT | Status: DC | PRN

## 2013-01-23 MED ORDER — OXYCODONE HCL 5 MG PO TABS: 5.0000 mg | ORAL_TABLET | ORAL | Status: DC | PRN

## 2013-01-23 MED ORDER — ALBUTEROL SULFATE (5 MG/ML) 0.5% IN NEBU: 2.5000 mg | INHALATION_SOLUTION | Freq: Three times a day (TID) | RESPIRATORY_TRACT | Status: DC

## 2013-01-23 MED ORDER — ENOXAPARIN SODIUM 40 MG/0.4ML ~~LOC~~ SOLN
40.0000 mg | SUBCUTANEOUS | Status: DC
  Administered 2013-01-23: 40 mg via SUBCUTANEOUS
  Filled 2013-01-23: qty 0.4

## 2013-01-23 NOTE — Progress Notes (Addendum)
[name], male   DOB: 1945/07/27, 67 y.o.   MRN: [id number] [name]        MRN:  [id number]          DOB/AGE: 24-Aug-1945 / 67 y.o.    Norlene Campbell, MD   Jacqualine Code, PA-C [address] [place], [place]  [address]                             [phone]   PROGRESS NOTE  Subjective:  negative for Chest Pain  negative for Shortness of Breath  negative for Nausea/Vomiting   negative for Calf Pain    Tolerating Diet: yes         Patient reports pain as mild and moderate.     Complains of some diaphragm pain especially with cough  Objective: Vital signs in last 24 hours:   Patient Vitals for the past 24 hrs:  BP Temp Temp src Pulse Resp SpO2 Height Weight  01/23/13 0544 130/66 mmHg 99.5 F (37.5 C) - 79 18 98 % - -  01/23/13 0158 113/69 mmHg 99.7 F (37.6 C) - 90 19 97 % - -  01/22/13 2121 - - - - - 98 % - -  01/22/13 2100 140/71 mmHg 100.4 F (38 C) Oral 85 20 96 % - -  01/22/13 1831 116/62 mmHg 99.1 F (37.3 C) Oral 90 20 97 % 6\' 1"  (1.854 m) 136.533 kg (301 lb)  01/22/13 1553 - 99.8 F (37.7 C) Oral - - - - -  01/22/13 1545 104/66 mmHg - - 96 22 98 % - -  01/22/13 1500 97/59 mmHg - - 98 22 98 % - -  01/22/13 1228 - 98.3 F (36.8 C) Oral - - - - -  01/22/13 0840 - - - - - 98 % - -  01/22/13 0801 - 98.8 F (37.1 C) Oral - - - - -      Intake/Output from previous day:   11/12 0701 - 11/13 0700 In: -  Out: 800 [Urine:800]   Intake/Output this shift:       Intake/Output     11/12 0701 - 11/13 0700 11/13 0701 - 11/14 0700   I.V. (mL/kg)     IV Piggyback     Total Intake(mL/kg)     Urine (mL/kg/hr) 800 (0.2)    Blood     Total Output 800     Net -800          Urine Occurrence 1 x       LABORATORY DATA:  Recent Labs  01/22/13 0330 01/23/13 0333  WBC 7.3 9.7  HGB 14.0 13.6  HCT 41.6 40.4  PLT 132* 161    Recent Labs  01/22/13 0330 01/23/13 0333  NA 139 136  K 3.9 4.1  CL 100 97  CO2 31 28  BUN 15  26*  CREATININE 1.41* 1.83*  GLUCOSE 112* 106*  CALCIUM 8.4 9.0   Lab Results  Component Value Date   INR 0.95 01/14/2013    Recent Radiographic Studies :  Dg Chest Port 1 View  Feb 15, 2013   CLINICAL DATA:  Respiratory distress.  EXAM: PORTABLE CHEST - 1 VIEW  COMPARISON:  09/17/2012.  FINDINGS: Very poor inspiration. Stable elevation of the left hemidiaphragm. Mild atelectasis at both lung bases. Borderline enlarged cardiac silhouette, accentuated by the poor inspiration. Thoracic spine degenerative changes and right humeral head prosthesis.  IMPRESSION: Poor inspiration with mild bibasilar atelectasis.   Electronically Signed   By: Gordan Payment M.D.   On: 01/21/2013 19:14   Dg Shoulder Right Port  01/21/2013   CLINICAL DATA:  Postop right shoulder hemiarthroplasty.  EXAM: PORTABLE RIGHT SHOULDER - 2+ VIEW  COMPARISON:  Chest radiograph 09/17/2012  FINDINGS: There are immediate postoperative changes of right shoulder hemiarthroplasty. The hardware appears intact. There is no evidence of periprosthetic fracture. Expected gas is seen within the adjacent soft tissues of the arm. There degenerative changes of the acromioclavicular joint.  IMPRESSION: Postoperative changes of right shoulder hemiarthroplasty. No complicating feature identified.   Electronically Signed   By: Britta Mccreedy M.D.   On: 01/21/2013 14:28     Examination:  General appearance: alert, cooperative, mild distress and moderate distress  Wound Exam: clean, dry, intact   Drainage:  None: wound tissue dry  Motor Exam: Wrist Dorsiflexion, EDC, FDP and EPL Intact  Sensory Exam: Radial, Ulnar and Median normal  Vascular Exam: Right radial artery has 1+ (weak) pulse  Assessment:    2 Days Post-Op  Procedure(s) (LRB): SHOULDER HEMI ARTHROPLASTY (Right)  ADDITIONAL DIAGNOSIS:  Active Problems:   Dyspnea   Acute respiratory failure with hypoxia   COPD (chronic obstructive pulmonary disease)   Hypertension  Renal  Insufficiency Chronic   Plan:   DVT Prophylaxis:  Aspirin  DISCHARGE PLAN: Home  DISCHARGE NEEDS: possible home health OT  Possible D/C pending medical clearance         [facility] 01/23/2013 7:42 AM

## 2013-01-23 NOTE — Progress Notes (Signed)
PULMONARY  / CRITICAL CARE MEDICINE  Name: [name] MRN: [id number] DOB: February 01, 1946    ADMISSION DATE:  01/21/2013 CONSULTATION DATE:  01/23/2013  REFERRING MD :  Dr. Cleophas Dunker PRIMARY SERVICE: Orthopaedics   CHIEF COMPLAINT:  Acute Respiratory Failure   BRIEF PATIENT DESCRIPTION: Pt is a 67 y/o M with PMHx for HTN, COPD secondary to exposure who is s/p R should arthroplasty, developing dyspnea and respiratory distress after extubation.  CCM consulted for impending respiratory failure.   SIGNIFICANT EVENTS / STUDIES:  11/11 >> OR for R shoulder surgery 11/11 >> Respiratory distress, CXR showing decreased lung volumes B/L, pulmonary edema 11/11 >> Started on BiPAP 11/12- improved after lasix 11/13 Hx of likely OSA elicited. PSG requested as outpt   SUBJECTIVE:   No new complaints. No distress   VITAL SIGNS: Temp:  [98.2 F (36.8 C)-100.4 F (38 C)] 98.2 F (36.8 C) (11/13 1410) Pulse Rate:  [72-90] 72 (11/13 1410) Resp:  [16-20] 16 (11/13 1410) BP: (113-140)/(59-71) 121/63 mmHg (11/13 1410) SpO2:  [90 %-98 %] 95 % (11/13 1530) Weight:  [136.533 kg (301 lb)] 136.533 kg (301 lb) (11/12 1831) HEMODYNAMICS:   VENTILATOR SETTINGS:   INTAKE / OUTPUT: Intake/Output     11/12 0701 - 11/13 0700 11/13 0701 - 11/14 0700   P.O.  240   I.V. (mL/kg)     IV Piggyback     Total Intake(mL/kg)  240 (1.8)   Urine (mL/kg/hr) 800 (0.2)    Blood     Total Output 800     Net -800 +240        Urine Occurrence 1 x      PHYSICAL EXAMINATION: General:  NAD, plethoric Neuro:  No focal deficits  Cardiovascular:  RRR, no M Lungs: bronchial BS in L base Abdomen:obese, soft, NT, NABS Ext: warm, no edema   LABS: I have reviewed all of today's lab results. Relevant abnormalities are discussed in the A/P section  CXR: Elevated L hemidiaphragm   ASSESSMENT / PLAN: Acute Respiratory Failure, resolved Chronic resp failure - highly suspect OSA/OHS  Chronic elevation of L  hemidiaphragm  Repeat CXR today Schedule outpt polysomnogram Will need Sleep Eval if PSG abnormal - can be scheduled by primary MD PCCM will sign off. Please call if we can be of further assistance  [name], MD ; [facility] [phone].  After 5:30 PM or weekends, call [phone]

## 2013-01-23 NOTE — Progress Notes (Signed)
Patient and wife given discharge instructions and prescription; all questions answered.  Jacqualine Code, PA explained procedure for scheduling an outpatient sleep study with the patient and his wife; patient and wife express understanding.  Patient discharged home with wife.

## 2013-01-23 NOTE — Evaluation (Signed)
Occupational Therapy Evaluation Patient Details Name: Travis Daniels MRN: [id number] DOB: 1945/05/25 Today's Date: 01/23/2013 Time: 1240-1315 OT Time Calculation (min): 35 min  OT Assessment / Plan / Recommendation History of present illness Pt is a 67 y/o male s/p Right total joint replacement of the shoulder on 01/21/13, & he was noted to develop ARF after surgery.   Clinical Impression   Pt admitted w/ dx as above. He & his wife have been educated in right shoulder ADL's, sling use/care, don/doffing, sponge bathing/dressing techniques & AROM hand, wrist/digits, elbow. As well as sleeping positioning. Pt will have assist form his wife at d/c and should benefit from [facility] vs MD to progress HEP. Handout issued & reviewed w/ precautions & home program.     OT Assessment  Progress rehab of shoulder as ordered by MD at follow-up appointment    Follow Up Recommendations  Home health OT    Barriers to Discharge      Equipment Recommendations  3 in 1 bedside comode    Recommendations for Other Services    Frequency       Precautions / Restrictions Precautions Precautions: Fall;Shoulder Shoulder Interventions: Shoulder sling/immobilizer;Off for dressing/bathing/exercises Precaution Booklet Issued: Yes (comment) Required Braces or Orthoses: Sling   Pertinent Vitals/Pain No c/o    ADL  Eating/Feeding: Performed;Modified independent Where Assessed - Eating/Feeding: Chair Grooming: Simulated;Set up Where Assessed - Grooming: Unsupported standing;Unsupported sitting Upper Body Bathing: Simulated;Minimal assistance Where Assessed - Upper Body Bathing: Unsupported sitting Lower Body Bathing: Simulated;Minimal assistance Where Assessed - Lower Body Bathing: Supported sit to stand Upper Body Dressing: Performed;Minimal assistance Where Assessed - Upper Body Dressing: Unsupported sitting;Unsupported standing Lower Body Dressing: Simulated;Minimal assistance Where Assessed - Lower Body  Dressing: Supported sit to stand Toilet Transfer: Research scientist (life sciences) Method: Sit to stand;Other (comment) (Ambulating into bathroom w/o AD) Toilet Transfer Equipment: Comfort height toilet;Grab bars Toileting - Clothing Manipulation and Hygiene: Performed;Supervision/safety Where Assessed - Toileting Clothing Manipulation and Hygiene: Sit to stand from 3-in-1 or toilet;Standing Tub/Shower Transfer Method: Not assessed Equipment Used: Other (comment) (Right shoulder sling) Transfers/Ambulation Related to ADLs: Pt is supervision level for functional mobility and trasnfers for safety. ADL Comments: Pt & his wife were educated in role of OT, dressing/bathing techniques, sleeping positions, don/doffing sling, AROM right hand, wrist & elbow. Handout was issued and reviewed. Pt was educated in No AROM right shoulder & verbalized understanding of this.    OT Diagnosis:    OT Problem List:   OT Treatment Interventions:     OT Goals(Current goals can be found in the care plan section) Acute Rehab OT Goals Patient Stated Goal: Home when able "Maybe tommorrow"  Visit Information  Last OT Received On: 01/23/13 Assistance Needed: +1 History of Present Illness: Pt is a 67 y/o male s/p Right total joint replacement of the shoulder on 01/21/13, & he was noted to develop ARF after surgery.       Prior Functioning     Home Living Family/patient expects to be discharged to:: Private residence Living Arrangements: Spouse/significant other Available Help at Discharge: Family Type of Home: House Home Access: Stairs to enter Secretary/administrator of Steps: 3 STE Entrance Stairs-Rails: Can reach both Home Layout: Two level Alternate Level Stairs-Number of Steps: Doesn't use second floor Home Equipment: None Prior Function Level of Independence: Independent Comments: Drives Communication Communication: No difficulties Dominant Hand: Left    Vision/Perception Vision -  History Baseline Vision: Wears glasses all the time Patient Visual Report: No change from  baseline   Cognition  Cognition Arousal/Alertness: Awake/alert Behavior During Therapy: WFL for tasks assessed/performed Overall Cognitive Status: Within Functional Limits for tasks assessed    Extremity/Trunk Assessment Upper Extremity Assessment Upper Extremity Assessment: RUE deficits/detail RUE Deficits / Details: S/P right shoulder hemiarthroplasty 01/21/13 RUE: Unable to fully assess due to immobilization Lower Extremity Assessment Lower Extremity Assessment: Overall WFL for tasks assessed Cervical / Trunk Assessment Cervical / Trunk Assessment: Normal    Mobility Bed Mobility Bed Mobility: Not assessed Details for Bed Mobility Assistance: Pt up in chair. Pt/spouse were verbally educated in log roll onto left side. Transfers Transfers: Sit to Stand;Stand to Sit Sit to Stand: 6: Modified independent (Device/Increase time);From chair/3-in-1;From toilet Stand to Sit: 6: Modified independent (Device/Increase time);To chair/3-in-1;To toilet;5: Supervision Details for Transfer Assistance: Pt demonstrated good technique     Exercise Donning/doffing shirt without moving shoulder: Supervision/safety;Minimal assistance;Set-up Method for sponge bathing under operated UE: Supervision/safety;Set-up;Minimal assistance Donning/doffing sling/immobilizer: Supervision/safety;Minimal assistance Correct positioning of sling/immobilizer: Supervision/safety Pendulum exercises (written home exercise program):  (Issued handout, awaiting MD orders) ROM for elbow, wrist and digits of operated UE: Modified independent Sling wearing schedule (on at all times/off for ADL's): Modified independent Proper positioning of operated UE when showering: Supervision/safety (Simulated, verbally reviewed) Positioning of UE while sleeping: Modified independent       End of Session OT - End of Session Equipment Utilized  During Treatment: Other (comment) (Right shoulder sling/immobilizer) Activity Tolerance: Patient tolerated treatment well Patient left: in chair;with call bell/phone within reach;with family/visitor present  GO     Charletta Cousin, Yerachmiel Spinney Beth Dixon 01/23/2013, 1:30 PM

## 2013-01-23 NOTE — Evaluation (Signed)
Physical Therapy Evaluation Patient Details Name: Travis Daniels MRN: [id number] DOB: 1945/05/03 Today's Date: 01/23/2013 Time: 4782-9562 PT Time Calculation (min): 26 min  PT Assessment / Plan / Recommendation History of Present Illness  Pt is a 67 y/o male s/p Right total joint replacement of the shoulder on 01/21/13, & he was noted to develop ARF after surgery.  Clinical Impression  Patient independent with activity. Educated on energy conservation techniques and safety for mobility.  Pt performed full flight of stairs without difficulty. Demonstrates good overall mobility despite RUE deficits. Patient oxygen level monitored throughout activity. 98% on 2.5 liters, 95% on room air prior to activity, patient did desaturate to 91% after completing stair negotiation and ambulation but rebounded after a few moments rest to 95% on room air.     PT Assessment  Patent does not need any further PT services    Follow Up Recommendations  No PT follow up;Supervision - Intermittent          Equipment Recommendations  None recommended by PT          Precautions / Restrictions Precautions Precautions: Fall;Shoulder Shoulder Interventions: Shoulder sling/immobilizer;Off for dressing/bathing/exercises Precaution Booklet Issued: Yes (comment) Required Braces or Orthoses: Sling   Pertinent Vitals/Pain 3/10 shoulder pain;  SpO2 levels 98% on 2.5 liters, 95% on room air prior to activity, patient did desaturate to 91% after completing stair negotiation and ambulation but rebounded after a few moments rest to 95% on room air.       Mobility  Bed Mobility Bed Mobility: Not assessed Details for Bed Mobility Assistance: Pt up in chair. Pt/spouse were verbally educated in log roll onto left side. Transfers Transfers: Sit to Stand;Stand to Sit Sit to Stand: 6: Modified independent (Device/Increase time);From chair/3-in-1;From toilet Stand to Sit: 6: Modified independent (Device/Increase  time);To chair/3-in-1;To toilet;5: Supervision Details for Transfer Assistance: Pt demonstrated good technique Ambulation/Gait Ambulation/Gait Assistance: 7: Independent Ambulation Distance (Feet): 240 Feet Assistive device: None Ambulation/Gait Assistance Details: WFL Gait Pattern: Within Functional Limits Stairs: Yes Stairs Assistance: 6: Modified independent (Device/Increase time) Stair Management Technique: One rail Left;Step to pattern;Forwards Number of Stairs: 12    Exercises Donning/doffing shirt without moving shoulder: Supervision/safety;Minimal assistance;Set-up Method for sponge bathing under operated UE: Supervision/safety;Set-up;Minimal assistance Donning/doffing sling/immobilizer: Supervision/safety;Minimal assistance Correct positioning of sling/immobilizer: Supervision/safety Pendulum exercises (written home exercise program):  (Issued handout, awaiting MD orders) ROM for elbow, wrist and digits of operated UE: Modified independent Sling wearing schedule (on at all times/off for ADL's): Modified independent Proper positioning of operated UE when showering: Supervision/safety (Simulated, verbally reviewed) Positioning of UE while sleeping: Modified independent   P  PT Goals(Current goals can be found in the care plan section) Acute Rehab PT Goals Patient Stated Goal: Home when able "Maybe tommorrow" PT Goal Formulation: No goals set, d/c therapy  Visit Information  Last PT Received On: 01/23/13 Assistance Needed: +1 History of Present Illness: Pt is a 67 y/o male s/p Right total joint replacement of the shoulder on 01/21/13, & he was noted to develop ARF after surgery.       Prior Functioning  Home Living Family/patient expects to be discharged to:: Private residence Living Arrangements: Spouse/significant other Available Help at Discharge: Family Type of Home: House Home Access: Stairs to enter Secretary/administrator of Steps: 3 STE Entrance Stairs-Rails:  Can reach both Home Layout: Two level Alternate Level Stairs-Number of Steps: Doesn't use second floor Home Equipment: None Prior Function Level of Independence: Independent Comments: Drives Communication Communication: No difficulties  Dominant Hand: Left    Cognition  Cognition Arousal/Alertness: Awake/alert Behavior During Therapy: WFL for tasks assessed/performed Overall Cognitive Status: Within Functional Limits for tasks assessed    Extremity/Trunk Assessment Upper Extremity Assessment Upper Extremity Assessment: RUE deficits/detail RUE Deficits / Details: S/P right shoulder hemiarthroplasty 01/21/13 RUE: Unable to fully assess due to immobilization Lower Extremity Assessment Lower Extremity Assessment: Overall WFL for tasks assessed Cervical / Trunk Assessment Cervical / Trunk Assessment: Normal   Balance Balance Balance Assessed: Yes High Level Balance High Level Balance Activites: Side stepping;Backward walking;Direction changes;Turns;Sudden stops;Head turns High Level Balance Comments: independent  End of Session PT - End of Session Equipment Utilized During Treatment: Gait belt Activity Tolerance: Patient tolerated treatment well Patient left: in chair;with call bell/phone within reach;with family/visitor present Nurse Communication: Mobility status;Other (comment) (O2 levels)  GP     Fabio Asa 01/23/2013, 3:44 PM Charlotte Crumb, PT DPT  [phone]

## 2013-01-23 NOTE — Discharge Summary (Signed)
Travis Campbell, MD   Travis Code, PA-C [address], [place], [place]  [address]                             [phone]  [name]        MRN:  [id number]          DOB/AGE: [birth year] / 67 y.o.    DISCHARGE SUMMARY  ADMISSION DATE:    01/21/2013 DISCHARGE DATE:   01/23/2013   ADMISSION DIAGNOSIS: RIGHT SHOULDER OSTEOARTHRITIS    DISCHARGE DIAGNOSIS:  RIGHT SHOULDER OSTEOARTHRITIS    ADDITIONAL DIAGNOSIS: Principal Problem:   Osteoarthritis of shoulder Active Problems:   Dyspnea   Acute respiratory failure with hypoxia   COPD (chronic obstructive pulmonary disease)   Hypertension   Snoring disorder   Hypersomnolence   Obesity, Class III, BMI 40-49.9 (morbid obesity)  Past Medical History  Diagnosis Date  . Hyperlipidemia   . Hypertension   . 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 B12 deficiency   . Lower extremity edema   . COPD (chronic obstructive pulmonary disease)     ?  Marland Kitchen Arthritis     PROCEDURE: Procedure(s): SHOULDER HEMI ARTHROPLASTY Right on 01/21/2013  CONSULTS: none     HISTORY: [name] is a very pleasant, 67 year old white male who has had particular problems with his right shoulder in the past. He has been through the [place] system and was told he basically had bone-on-bone osteoarthritis. He has been tried with several anti-inflammatory medicines and even injections, but nothing is really making much of a difference. He notes the fact that if he would have been right-handed instead of left handed, he would not be able to function well. He still, though, is limited because of the loss of function in the right arm being nondominant. He has trouble sleeping. He has also noted that he does not have much range of motion as previously noted. His symptoms are worsening. He has had multiple evaluations in the [place] system but came to [place] for an independent evaluation. He was tried with Voltaren gel as well as discussion of  other treatment options. He, unfortunately, continued to have worsening problems because of his lack of motion and the fact that he is having worsening pain and discomfort where his bad days certainly outnumber his good days. He is less functional. Difficulty with his ADLs.    HOSPITAL COURSE:  Travis Daniels is a 67 y.o. admitted on 01/21/2013 and found to have a diagnosis of RIGHT SHOULDER OSTEOARTHRITIS.  After appropriate laboratory studies were obtained  they were taken to the operating room on 01/21/2013 and underwent  Procedure(s): SHOULDER HEMI ARTHROPLASTY  Right.   They were given perioperative antibiotics:  Anti-infectives   Start     Dose/Rate Route Frequency Ordered Stop   01/21/13 1600  ceFAZolin (ANCEF) IVPB 2 g/50 mL premix     2 g 100 mL/hr over 30 Minutes Intravenous Every 6 hours 01/21/13 1444 01/22/13 0429   01/21/13 0600  ceFAZolin (ANCEF) 3 g in dextrose 5 % 50 mL IVPB     3 g 160 mL/hr over 30 Minutes Intravenous On call to O.R. 01/20/13 1421 01/21/13 0738    . Had dyspnea with elevation of left hemi diaphragm and dyspnea (near apnea) after scalene nerve block preop and was rushed into the OR and rapidly intubated.  Placed in a stepdown  unit and became dyspneic and sent to ICU and placed on BIPAP and regained control of his breathing.   Tolerated the procedure well.      Toradol was given post op.   POD #1, allowed out of bed to a chair.  Weaned  Off of BIPAP and once felt to be stable was sent to [facility] telemetry where he continued to improve respiratory wise.  POD #2, weaned off mask and nasal O2 to room air.  Maintained his sats   The remainder of the hospital course was dedicated to ambulation and strengthening.   The patient was discharged on 2 Days Post-Op in  Stable condition.  Blood products given:none  DIAGNOSTIC STUDIES: Recent vital signs:  Patient Vitals for the past 24 hrs:  BP Temp Temp src Pulse Resp SpO2 Height Weight  28-Jan-2013 1530 - - - - - 95 %  - -  2013/01/28 1500 - - - - - 98 % - -  2013-01-28 1410 121/63 mmHg 98.2 F (36.8 C) Oral 72 16 90 % - -  01-28-13 1101 116/59 mmHg 98.2 F (36.8 C) Oral 72 16 96 % - -  01/28/2013 1023 - - - - - 96 % - -  01-28-2013 0936 113/65 mmHg - - 75 - 96 % - -  01-28-13 0544 130/66 mmHg 99.5 F (37.5 C) - 79 18 98 % - -  2013/01/28 0158 113/69 mmHg 99.7 F (37.6 C) - 90 19 97 % - -  01/22/13 2121 - - - - - 98 % - -  01/22/13 2100 140/71 mmHg 100.4 F (38 C) Oral 85 20 96 % - -  01/22/13 1831 116/62 mmHg 99.1 F (37.3 C) Oral 90 20 97 % 6\' 1"  (1.854 m) 136.533 kg (301 lb)       Recent laboratory studies:  Recent Labs  01/22/13 0330 28-Jan-2013 0333  WBC 7.3 9.7  HGB 14.0 13.6  HCT 41.6 40.4  PLT 132* 161    Recent Labs  01/22/13 0330 28-Jan-2013 0333  NA 139 136  K 3.9 4.1  CL 100 97  CO2 31 28  BUN 15 26*  CREATININE 1.41* 1.83*  GLUCOSE 112* 106*  CALCIUM 8.4 9.0   Lab Results  Component Value Date   INR 0.95 01/14/2013     Recent Radiographic Studies :  Dg Chest Port 1 View  28-Jan-2013   CLINICAL DATA:  Mid chest pain. Abnormal breath sounds at the left lung base.  EXAM: PORTABLE CHEST - 1 VIEW  COMPARISON:  01/21/2013 and 09/17/2012  FINDINGS: Chronic cardiomegaly. Pulmonary vascularity is normal. There is chronic elevation of the left hemidiaphragm with minimal chronic atelectasis at the left base. There is improved atelectasis at the right base, now minimal.  IMPRESSION: Chronic elevation of the left hemidiaphragm. Atelectasis at the bases is minimal and improved.   Electronically Signed   By: Geanie Cooley M.D.   On: 01/28/2013 13:17   Dg Chest Port 1 View  01/21/2013   CLINICAL DATA:  Respiratory distress.  EXAM: PORTABLE CHEST - 1 VIEW  COMPARISON:  09/17/2012.  FINDINGS: Very poor inspiration. Stable elevation of the left hemidiaphragm. Mild atelectasis at both lung bases. Borderline enlarged cardiac silhouette, accentuated by the poor inspiration. Thoracic spine degenerative  changes and right humeral head prosthesis.  IMPRESSION: Poor inspiration with mild bibasilar atelectasis.   Electronically Signed   By: Gordan Payment M.D.   On: 01/21/2013 19:14   Dg Shoulder Right Port  01/21/2013  CLINICAL DATA:  Postop right shoulder hemiarthroplasty.  EXAM: PORTABLE RIGHT SHOULDER - 2+ VIEW  COMPARISON:  Chest radiograph 09/17/2012  FINDINGS: There are immediate postoperative changes of right shoulder hemiarthroplasty. The hardware appears intact. There is no evidence of periprosthetic fracture. Expected gas is seen within the adjacent soft tissues of the arm. There degenerative changes of the acromioclavicular joint.  IMPRESSION: Postoperative changes of right shoulder hemiarthroplasty. No complicating feature identified.   Electronically Signed   By: Britta Mccreedy M.D.   On: 01/21/2013 14:28    DISCHARGE INSTRUCTIONS:     Discharge Orders   Future Appointments Provider Department Dept Phone   02/28/2013 11:00 AM Barbaraann Share, MD [facility] [phone]   Future Orders Complete By Expires   Call MD / Call 911  As directed    Comments:     If you experience chest pain or shortness of breath, CALL 911 and be transported to the hospital emergency room.  If you develope a fever above 101 F, pus (white drainage) or increased drainage or redness at the wound, or calf pain, call your surgeon's office.   Constipation Prevention  As directed    Comments:     Drink plenty of fluids.  Prune juice and/or coffee may be helpful.  You may use a stool softener, such as Colace (over the counter) 100 mg twice a day.  Use MiraLax (over the counter) for constipation as needed but this may take several days to work.  Mag Citrate --OR-- Milk of Magnesia may also be used but follow directions on the label.   Diet general  As directed    Discharge instructions  As directed    Comments:     KEEP DRESSING ON TILL SEEN IN THE OFFICE ON Monday.  USE SLING.  MAY MOVE ELBOW BUT KEEP UPPER  ARM AGAINST YOUR CHEST   Driving restrictions  As directed    Comments:     No driving for 6 weeks   Increase activity slowly as tolerated  As directed    Lifting restrictions  As directed    Comments:     No lifting for 6 weeks   Non weight bearing  As directed    Questions:     Laterality:  right   Extremity:  Upper      DISCHARGE MEDICATIONS:     Medication List    STOP taking these medications       naproxen sodium 220 MG tablet  Commonly known as:  ANAPROX      TAKE these medications       aspirin 81 MG tablet  Take 81 mg by mouth daily.     diltiazem 120 MG 24 hr capsule  Commonly known as:  CARDIZEM CD  Take 1 capsule (120 mg total) by mouth daily.     furosemide 20 MG tablet  Commonly known as:  LASIX  Take 1 tablet (20 mg total) by mouth daily.     losartan 100 MG tablet  Commonly known as:  COZAAR  Take 1 tablet (100 mg total) by mouth daily.     oxyCODONE 5 MG immediate release tablet  Commonly known as:  Oxy IR/ROXICODONE  Take 1-2 tablets (5-10 mg total) by mouth every 4 (four) hours as needed for moderate pain or severe pain.     tiotropium 18 MCG inhalation capsule  Commonly known as:  SPIRIVA  Place 18 mcg into inhaler and inhale daily.     vitamin  B-12 1000 MCG tablet  Commonly known as:  CYANOCOBALAMIN  Take 1,000 mcg by mouth daily.     Vitamin D-3 1000 UNITS Caps  Take 1,000 Units by mouth.        FOLLOW UP VISIT:   Follow-up Information   Follow up with [facility], Claude Manges, MD. Schedule an appointment as soon as possible for a visit on 01/27/2013.   Specialty:  Orthopedic Surgery   Contact information:   [address] [place] [address] [phone]       DISPOSITION:   Home  CONDITION:  Stable   PETRARCA,BRIAN 01/23/2013, 6:04 PM

## 2013-01-23 NOTE — Progress Notes (Signed)
01-23-13 Referral for : Please assist in scheduling outpt sleep study. Physician order form on patient's shadow chart . MD please complete and sign . Once signed NCM will fax to [facility] and arrange appointment. Patient aware of same . Thanks Ronny Flurry RN BSN [phone]

## 2013-01-24 NOTE — Op Note (Signed)
Travis Daniels, Travis Daniels NO.:  [id number]  MEDICAL RECORD NO.:  [id number]  LOCATION:  2M06C                        FACILITY:  [facility]  PHYSICIAN:  Claude Manges. [name], M.D.DATE OF BIRTH:  09-Aug-1945  DATE OF PROCEDURE:  01/21/2013 DATE OF DISCHARGE:                              OPERATIVE REPORT   PREOPERATIVE DIAGNOSIS:  Osteoarthritis, right shoulder.  POSTOPERATIVE DIAGNOSIS:  Osteoarthritis, right shoulder.  PROCEDURE:  Hemiarthroplasty, right shoulder.  SURGEON:  Claude Manges. Cleophas Dunker, MD  ASSISTANT:  Oris Drone. Petrarca, PA-C  ANESTHESIA:  General with supplemental interscalene nerve block.  COMPLICATIONS:  None.  COMPONENTS:  DePuy 14-mm humeral stem, a 52-mm outer diameter metallic humeral head with an 16-XW neck length, eccentric.  INDICATIONS FOR PROCEDURE:  Travis Daniels was met in the holding area, identified the right shoulder as appropriate operative site.  He did receive a preoperative interscalene nerve block per Anesthesia.  He had some difficulty breathing when he was taken to the operating room, probably related to the block and he was intubated without difficulty, but it was felt that he might need closer observation postoperatively in a step-down unit.  He did remain stable throughout the procedure.  Travis Daniels was then placed in the shoulder frame after intubation and placed in the semi-recumbent position.  Examination of the right shoulder revealed considerable crepitation that was probably only 20 degrees of external rotation.  He was able to abduct about 90 degrees and flex maybe 100 degrees.  The right shoulder was then prepped with chlorhexidine scrub and then DuraPrep from the base of the neck to the wrist.  Sterile draping was performed.  A deltopectoral groove incision was outlined with a marking pencil.  A 15 blade knife was used to make the incision, starting just inferior to the clavicle across the coracoid, extending  distally into the arm.  Via sharp dissection, subcutaneous tissue and superficial fascia were incised.  Gross bleeders were Bovie coagulated.  I was able to easily find the deltopectoral groove and the cephalic vein.  This was retracted laterally and then by finger dissection, I released the deltoid and inserted a self-retaining retractor.  Conjoint tendon remained intact. I just incised several mm of the coracoacromial ligament.  I did not need to resect the pectoralis muscle.  I was then able to palpate both the musculocutaneous and the axillary nerves and felt they were well out of harm's way.  The clavipectoral fascia was then incised.  I was able to determine the biceps groove.  This was incised.  There was considerable fluid from the joint and within the biceps groove, the tendon was quite thin and reddened, so I elected to perform a biceps tenodesis.  This was then tenodesed with 0 Ethibond suture to the inferior groove and then incised.  I removed the anterior capsule and the subscapularis as 1 unit using an osteotome by removing a small piece of bone with the tendon superiorly to inferiorly, and then inserted sutures and retracted to the level of the glenoid.  I removed any of the capsule.  There were multiple osteophytes.  These were removed within the visualization anteriorly and I released the capsule to at least 6 o'clock  position, again being sure to protect the musculocutaneous and axillary nerves.  At that point, I could externally rotate the head and delivered into the wound.  I again removed further osteophytes with further visualization.  A center hole was then made superiorly in the head at the groove and articular cartilage junction.  I made a small drill hole with a 3.2-mm drill hole and then hand reamed to 14 mm.  I left the reamer in place, inserted the intramedullary cutting device over the reamer and then osteotomized the head in about 30 to 35 degrees  retroversion.  Rasping was performed sequentially to 14.  With the rasp in place, I then evaluated the glenoid.  There were still some loose bodies in the inferior recess which I removed.  There was considerable inflammatory synovitis circumferentially about the glenoid and these were removed. The glenoid labrum was also excised.  The glenoid had nice articular cartilage, it did not appear to be flat and there was no loss of the bone circumferentially, so I elected to perform hemiarthroplasty.  We trialed several head sizes and elected to use the 52-mm outer diameter head with eccentricity built into the head and used an 18-mm neck.  With that construct, I was able to abduct the arm at least 90 degrees flex, probably 150 and there was about 50% subluxation of the head on the glenoid.  The trial components were removed.  I copiously irrigated the joint, again checked the joint to be sure there were no loose bodies.  I removed some further synovitis posteriorly and anteriorly.  The final metallic 14-mm humeral stem was then impacted, had an excellent fit.  I again trialed the eccentric 52-mm head with an 18-mm neck length and then reduced this construct, and I felt like I had perfect version, and the head was nicely aligned.  At that point, the final 52-mm eccentric head with an 18-mm neck was carefully impacted, felt that the head had nicely stable construct.  I again irrigated the joint and then reduced the humeral head and again through a full range of motion, I had very nice stability and excellent range of motion.  The subscapularis was then reattached with 0 Ethibond suture through drill holes and I used two 5.5-mm PEEK anchors with attached FiberWire. I thought I had a very nice repair and it was perfectly stable and allowed at least 45 degrees of external rotation.  The wound was again irrigated with saline solution.  The deltopectoral groove was closed with a running 0  Vicryl.  The cephalic vein appeared to be perfectly intact.  The wound was again irrigated with saline solution.  We closed the subcu with 2-0 Vicryl and 3-0 Monocryl.  Skin was closed with Steri-Strips over benzoin.  Sterile bulky dressing was applied followed by a shoulder immobilizer.  The patient tolerated the procedure without complications.  We planned to monitor him in the step-down unit postoperatively.     Claude Manges. Cleophas Dunker, M.D.     PWW/MEDQ  D:  01/21/2013  T:  01/22/2013  Job:  [id number]

## 2013-01-26 ENCOUNTER — Other Ambulatory Visit: Payer: Self-pay | Admitting: Family Medicine

## 2013-01-28 ENCOUNTER — Telehealth: Payer: Self-pay | Admitting: Family Medicine

## 2013-01-28 NOTE — Telephone Encounter (Signed)
Referrals must come from the provider. Patient is probably going to need a hospital f/u visit if he is needing a referral, unless it's something for which he's already seen Dr. Laury Axon. Routing to [employer].

## 2013-01-28 NOTE — Telephone Encounter (Signed)
Pt has appt @ Pulmonary on 02/28/13. He could try to call and get sooner appointment.

## 2013-01-28 NOTE — Telephone Encounter (Signed)
Patient wife called and stated that her husband was recently seen at the ER. Patient was having a lot of trouble breathing. The ER doctor recommended that Kendle have a sleep study test done now if possible. Also patient wife states that he already has an apt with a sleep study but just for a consult.

## 2013-01-28 NOTE — Telephone Encounter (Signed)
They usually see the pt first and then decide what test is the best

## 2013-01-29 ENCOUNTER — Telehealth: Payer: Self-pay | Admitting: Pulmonary Disease

## 2013-01-29 DIAGNOSIS — Z96619 Presence of unspecified artificial shoulder joint: Secondary | ICD-10-CM | POA: Diagnosis not present

## 2013-01-29 DIAGNOSIS — M19019 Primary osteoarthritis, unspecified shoulder: Secondary | ICD-10-CM | POA: Diagnosis not present

## 2013-01-29 DIAGNOSIS — M25669 Stiffness of unspecified knee, not elsewhere classified: Secondary | ICD-10-CM | POA: Diagnosis not present

## 2013-01-29 DIAGNOSIS — M25519 Pain in unspecified shoulder: Secondary | ICD-10-CM | POA: Diagnosis not present

## 2013-01-29 NOTE — Telephone Encounter (Signed)
I called and spoke with spouse and made aware of recs. Nothing further needed 

## 2013-01-29 NOTE — Telephone Encounter (Signed)
Sleep apnea is not an emergent/urgent diagnosis, so no benefit to asap sleep study. I have never seen pt, so I cannot order sleep test without face to face.  Insurance will not cover.  Will see him in dec.

## 2013-01-29 NOTE — Telephone Encounter (Signed)
Per hospital D/C with Dr. Sung Amabile:  Repeat CXR today  Schedule outpt polysomnogram  Will need Sleep Eval if PSG abnormal - can be scheduled by primary MD  PCCM will sign off. Please call if we can be of further assistance --  I spoke with spouse. She is wanting to know if the sleep study can be done prior to sleep consult with KC on 02/28/13. She was told it was very important pt get the study done ASAP. No sooner appts with any sleep doc. Please advise KC thanks

## 2013-01-30 ENCOUNTER — Encounter: Payer: Self-pay | Admitting: Family Medicine

## 2013-01-31 DIAGNOSIS — M19019 Primary osteoarthritis, unspecified shoulder: Secondary | ICD-10-CM | POA: Diagnosis not present

## 2013-01-31 DIAGNOSIS — Z96619 Presence of unspecified artificial shoulder joint: Secondary | ICD-10-CM | POA: Diagnosis not present

## 2013-01-31 DIAGNOSIS — M25669 Stiffness of unspecified knee, not elsewhere classified: Secondary | ICD-10-CM | POA: Diagnosis not present

## 2013-01-31 DIAGNOSIS — M25519 Pain in unspecified shoulder: Secondary | ICD-10-CM | POA: Diagnosis not present

## 2013-02-21 DIAGNOSIS — Z96619 Presence of unspecified artificial shoulder joint: Secondary | ICD-10-CM | POA: Diagnosis not present

## 2013-02-21 DIAGNOSIS — M25669 Stiffness of unspecified knee, not elsewhere classified: Secondary | ICD-10-CM | POA: Diagnosis not present

## 2013-02-21 DIAGNOSIS — M19019 Primary osteoarthritis, unspecified shoulder: Secondary | ICD-10-CM | POA: Diagnosis not present

## 2013-02-21 DIAGNOSIS — M25519 Pain in unspecified shoulder: Secondary | ICD-10-CM | POA: Diagnosis not present

## 2013-02-25 ENCOUNTER — Institutional Professional Consult (permissible substitution): Payer: Medicare Other | Admitting: Pulmonary Disease

## 2013-02-28 ENCOUNTER — Encounter: Payer: Self-pay | Admitting: Pulmonary Disease

## 2013-02-28 ENCOUNTER — Ambulatory Visit (INDEPENDENT_AMBULATORY_CARE_PROVIDER_SITE_OTHER): Payer: Medicare Other | Admitting: Pulmonary Disease

## 2013-02-28 VITALS — BP 160/100 | HR 72 | Temp 98.3°F | Ht 73.0 in | Wt 311.6 lb

## 2013-02-28 DIAGNOSIS — G4733 Obstructive sleep apnea (adult) (pediatric): Secondary | ICD-10-CM

## 2013-02-28 DIAGNOSIS — Z96619 Presence of unspecified artificial shoulder joint: Secondary | ICD-10-CM | POA: Diagnosis not present

## 2013-02-28 DIAGNOSIS — M19019 Primary osteoarthritis, unspecified shoulder: Secondary | ICD-10-CM | POA: Diagnosis not present

## 2013-02-28 DIAGNOSIS — M25519 Pain in unspecified shoulder: Secondary | ICD-10-CM | POA: Diagnosis not present

## 2013-02-28 DIAGNOSIS — M25669 Stiffness of unspecified knee, not elsewhere classified: Secondary | ICD-10-CM | POA: Diagnosis not present

## 2013-02-28 NOTE — Patient Instructions (Signed)
Will schedule for sleep study, and arrange followup once the results are available.   

## 2013-02-28 NOTE — Assessment & Plan Note (Addendum)
The patient's history is very suggestive of clinically significant sleep apnea. I have had a long discussion with him and his wife about sleep apnea, including its impact to his quality of life and cardiovascular health. I think he needs a sleep study for diagnosis, and the patient is agreeable to this approach.

## 2013-02-28 NOTE — Progress Notes (Signed)
Subjective:    [name], male    DOB: 1945/04/22, 67 y.o.   MRN: [id number]  HPI The patient is a 67 year old male who I've been asked to see for possible obstructive sleep apnea. His wife is very concerned, and states that she hears loud snoring as well as gasping episodes during the night. The patient does not feel this is a significant issue, but this was also noted during a recent hospitalization. The patient states that he may awaken 1-3 times a night to go to the bathroom, and denies nonrestorative sleep. However, his wife states that he will fall asleep anytime he sits down during the day, and the patient blames this on being "bored". He will also fall asleep easily watching television or movies in the evenings. He admits to having sleep pressure with driving longer distances. The patient states that his weight is up 20 pounds over the last 2 years, and his Epworth score today is 15.   Sleep Questionnaire What time do you typically go to bed?( Between what hours) 11pm - 1 am 11pm - 1 am at 1105 on 02/28/13 by Raford Pitcher, RN How long does it take you to fall asleep? 15-20 minutes 15-20 minutes at 1105 on 02/28/13 by Raford Pitcher, RN How many times during the night do you wake up? 11 0-2 at 1105 on 02/28/13 by Raford Pitcher, RN What time do you get out of bed to start your day? 0700 0700 at 1105 on 02/28/13 by Raford Pitcher, RN Do you drive or operate heavy machinery in your occupation? No No at 1105 on 02/28/13 by Raford Pitcher, RN How much has your weight changed (up or down) over the past two years? (In pounds) 15 lb (6.804 kg)15 lb (6.804 kg) up and down 15-20 lbs at 1105 on 02/28/13 by Raford Pitcher, RN Have you ever had a sleep study before? No No at 1105 on 02/28/13 by Raford Pitcher, RN Do you currently use CPAP? No No at 1105 on 02/28/13 by Raford Pitcher, RN Do you wear oxygen at any time? No No at 1105  on 02/28/13 by Raford Pitcher, RN   Review of Systems  Constitutional: Negative for fever and unexpected weight change.  HENT: Positive for congestion. Negative for dental problem, ear pain, nosebleeds, postnasal drip, rhinorrhea, sinus pressure, sneezing, sore throat and trouble swallowing.   Eyes: Negative for redness and itching.  Respiratory: Positive for shortness of breath. Negative for cough, chest tightness and wheezing.   Cardiovascular: Positive for leg swelling. Negative for palpitations.  Gastrointestinal: Negative for nausea and vomiting.  Genitourinary: Negative for dysuria.  Musculoskeletal: Negative for joint swelling.  Skin: Negative for rash.  Neurological: Negative for headaches.  Hematological: Does not bruise/bleed easily.  Psychiatric/Behavioral: Negative for dysphoric mood. The patient is not nervous/anxious.        Objective:   Physical Exam Constitutional:  Obese male, no acute distress  HENT:  Nares patent without discharge, deviated septum to left with narrowing.  Oropharynx without exudate, palate and uvula are thick and elongated.   Eyes:  Perrla, eomi, no scleral icterus  Neck:  No JVD, no TMG  Cardiovascular:  Normal rate, regular rhythm, no rubs or gallops.  No murmurs        Intact distal pulses  Pulmonary :  Normal breath sounds, no stridor or respiratory distress   No rales, rhonchi, or wheezing  Abdominal:  Soft, nondistended,  bowel sounds present.  No tenderness noted.   Musculoskeletal:  2+ lower extremity edema noted, compression hose in place  Lymph Nodes:  No cervical lymphadenopathy noted  Skin:  No cyanosis noted  Neurologic:  Alert, appropriate, moves all 4 extremities without obvious deficit.         Assessment & Plan:

## 2013-03-03 DIAGNOSIS — Z96619 Presence of unspecified artificial shoulder joint: Secondary | ICD-10-CM | POA: Diagnosis not present

## 2013-03-03 DIAGNOSIS — M25519 Pain in unspecified shoulder: Secondary | ICD-10-CM | POA: Diagnosis not present

## 2013-03-03 DIAGNOSIS — M19019 Primary osteoarthritis, unspecified shoulder: Secondary | ICD-10-CM | POA: Diagnosis not present

## 2013-03-03 DIAGNOSIS — M25669 Stiffness of unspecified knee, not elsewhere classified: Secondary | ICD-10-CM | POA: Diagnosis not present

## 2013-03-12 DIAGNOSIS — M25519 Pain in unspecified shoulder: Secondary | ICD-10-CM | POA: Diagnosis not present

## 2013-03-12 DIAGNOSIS — M25669 Stiffness of unspecified knee, not elsewhere classified: Secondary | ICD-10-CM | POA: Diagnosis not present

## 2013-03-12 DIAGNOSIS — Z96619 Presence of unspecified artificial shoulder joint: Secondary | ICD-10-CM | POA: Diagnosis not present

## 2013-03-12 DIAGNOSIS — M19019 Primary osteoarthritis, unspecified shoulder: Secondary | ICD-10-CM | POA: Diagnosis not present

## 2013-03-18 DIAGNOSIS — M25519 Pain in unspecified shoulder: Secondary | ICD-10-CM | POA: Diagnosis not present

## 2013-03-18 DIAGNOSIS — M25669 Stiffness of unspecified knee, not elsewhere classified: Secondary | ICD-10-CM | POA: Diagnosis not present

## 2013-03-18 DIAGNOSIS — Z96619 Presence of unspecified artificial shoulder joint: Secondary | ICD-10-CM | POA: Diagnosis not present

## 2013-03-18 DIAGNOSIS — M19019 Primary osteoarthritis, unspecified shoulder: Secondary | ICD-10-CM | POA: Diagnosis not present

## 2013-03-20 DIAGNOSIS — M25669 Stiffness of unspecified knee, not elsewhere classified: Secondary | ICD-10-CM | POA: Diagnosis not present

## 2013-03-20 DIAGNOSIS — M25519 Pain in unspecified shoulder: Secondary | ICD-10-CM | POA: Diagnosis not present

## 2013-03-20 DIAGNOSIS — M19019 Primary osteoarthritis, unspecified shoulder: Secondary | ICD-10-CM | POA: Diagnosis not present

## 2013-03-20 DIAGNOSIS — Z96619 Presence of unspecified artificial shoulder joint: Secondary | ICD-10-CM | POA: Diagnosis not present

## 2013-03-24 DIAGNOSIS — Z96619 Presence of unspecified artificial shoulder joint: Secondary | ICD-10-CM | POA: Diagnosis not present

## 2013-03-24 DIAGNOSIS — M19019 Primary osteoarthritis, unspecified shoulder: Secondary | ICD-10-CM | POA: Diagnosis not present

## 2013-03-24 DIAGNOSIS — M25669 Stiffness of unspecified knee, not elsewhere classified: Secondary | ICD-10-CM | POA: Diagnosis not present

## 2013-03-24 DIAGNOSIS — M25519 Pain in unspecified shoulder: Secondary | ICD-10-CM | POA: Diagnosis not present

## 2013-03-25 ENCOUNTER — Telehealth: Payer: Self-pay | Admitting: *Deleted

## 2013-03-25 ENCOUNTER — Ambulatory Visit (INDEPENDENT_AMBULATORY_CARE_PROVIDER_SITE_OTHER): Payer: Medicare Other | Admitting: *Deleted

## 2013-03-25 DIAGNOSIS — Z23 Encounter for immunization: Secondary | ICD-10-CM | POA: Diagnosis not present

## 2013-03-25 MED ORDER — TIOTROPIUM BROMIDE MONOHYDRATE 18 MCG IN CAPS: 18.0000 ug | ORAL_CAPSULE | Freq: Every day | RESPIRATORY_TRACT | Status: DC

## 2013-03-25 NOTE — Telephone Encounter (Signed)
03/25/2013  Pt came by office.  He needs a refill on tiotropium (SPIRIVA) 18 MCG inhalation capsule.  Please send to [facility]  Thank you.  bw

## 2013-03-25 NOTE — Telephone Encounter (Signed)
Rx faxed.    KP 

## 2013-03-26 DIAGNOSIS — M25669 Stiffness of unspecified knee, not elsewhere classified: Secondary | ICD-10-CM | POA: Diagnosis not present

## 2013-03-26 DIAGNOSIS — M25519 Pain in unspecified shoulder: Secondary | ICD-10-CM | POA: Diagnosis not present

## 2013-03-26 DIAGNOSIS — M19019 Primary osteoarthritis, unspecified shoulder: Secondary | ICD-10-CM | POA: Diagnosis not present

## 2013-03-26 DIAGNOSIS — Z96619 Presence of unspecified artificial shoulder joint: Secondary | ICD-10-CM | POA: Diagnosis not present

## 2013-03-28 DIAGNOSIS — M25669 Stiffness of unspecified knee, not elsewhere classified: Secondary | ICD-10-CM | POA: Diagnosis not present

## 2013-03-28 DIAGNOSIS — Z96619 Presence of unspecified artificial shoulder joint: Secondary | ICD-10-CM | POA: Diagnosis not present

## 2013-03-28 DIAGNOSIS — M19019 Primary osteoarthritis, unspecified shoulder: Secondary | ICD-10-CM | POA: Diagnosis not present

## 2013-03-28 DIAGNOSIS — M25519 Pain in unspecified shoulder: Secondary | ICD-10-CM | POA: Diagnosis not present

## 2013-03-30 ENCOUNTER — Other Ambulatory Visit: Payer: Self-pay | Admitting: Family Medicine

## 2013-03-31 ENCOUNTER — Ambulatory Visit (HOSPITAL_BASED_OUTPATIENT_CLINIC_OR_DEPARTMENT_OTHER): Payer: Medicare Other | Attending: Pulmonary Disease

## 2013-03-31 VITALS — Ht 73.0 in | Wt 311.0 lb

## 2013-03-31 DIAGNOSIS — G4733 Obstructive sleep apnea (adult) (pediatric): Secondary | ICD-10-CM | POA: Insufficient documentation

## 2013-04-01 DIAGNOSIS — Z96619 Presence of unspecified artificial shoulder joint: Secondary | ICD-10-CM | POA: Diagnosis not present

## 2013-04-01 DIAGNOSIS — M19019 Primary osteoarthritis, unspecified shoulder: Secondary | ICD-10-CM | POA: Diagnosis not present

## 2013-04-01 DIAGNOSIS — M25669 Stiffness of unspecified knee, not elsewhere classified: Secondary | ICD-10-CM | POA: Diagnosis not present

## 2013-04-01 DIAGNOSIS — M25519 Pain in unspecified shoulder: Secondary | ICD-10-CM | POA: Diagnosis not present

## 2013-04-03 DIAGNOSIS — M25669 Stiffness of unspecified knee, not elsewhere classified: Secondary | ICD-10-CM | POA: Diagnosis not present

## 2013-04-03 DIAGNOSIS — M25519 Pain in unspecified shoulder: Secondary | ICD-10-CM | POA: Diagnosis not present

## 2013-04-03 DIAGNOSIS — M19019 Primary osteoarthritis, unspecified shoulder: Secondary | ICD-10-CM | POA: Diagnosis not present

## 2013-04-03 DIAGNOSIS — Z96619 Presence of unspecified artificial shoulder joint: Secondary | ICD-10-CM | POA: Diagnosis not present

## 2013-04-07 DIAGNOSIS — M25669 Stiffness of unspecified knee, not elsewhere classified: Secondary | ICD-10-CM | POA: Diagnosis not present

## 2013-04-07 DIAGNOSIS — M19019 Primary osteoarthritis, unspecified shoulder: Secondary | ICD-10-CM | POA: Diagnosis not present

## 2013-04-07 DIAGNOSIS — M25519 Pain in unspecified shoulder: Secondary | ICD-10-CM | POA: Diagnosis not present

## 2013-04-07 DIAGNOSIS — Z96619 Presence of unspecified artificial shoulder joint: Secondary | ICD-10-CM | POA: Diagnosis not present

## 2013-04-09 ENCOUNTER — Telehealth: Payer: Self-pay | Admitting: Pulmonary Disease

## 2013-04-09 DIAGNOSIS — M25519 Pain in unspecified shoulder: Secondary | ICD-10-CM | POA: Diagnosis not present

## 2013-04-09 DIAGNOSIS — Z96619 Presence of unspecified artificial shoulder joint: Secondary | ICD-10-CM | POA: Diagnosis not present

## 2013-04-09 DIAGNOSIS — G4733 Obstructive sleep apnea (adult) (pediatric): Secondary | ICD-10-CM

## 2013-04-09 DIAGNOSIS — M25669 Stiffness of unspecified knee, not elsewhere classified: Secondary | ICD-10-CM | POA: Diagnosis not present

## 2013-04-09 DIAGNOSIS — M19019 Primary osteoarthritis, unspecified shoulder: Secondary | ICD-10-CM | POA: Diagnosis not present

## 2013-04-09 NOTE — Sleep Study (Signed)
   NAME: Travis Daniels DATE OF BIRTH:  13-Jun-1945 MEDICAL RECORD NUMBER [id number]  LOCATION: [facility]  PHYSICIAN: Kathee Delton  DATE OF STUDY: 03/31/2013  SLEEP STUDY TYPE: Nocturnal Polysomnogram               REFERRING PHYSICIAN: Haik Mahoney, Armando Reichert, MD  INDICATION FOR STUDY: Hypersomnia with sleep apnea  EPWORTH SLEEPINESS SCORE:  3 HEIGHT: 6\' 1"  (185.4 cm)  WEIGHT: 311 lb (141.069 kg)    Body mass index is 41.04 kg/(m^2).  NECK SIZE: 17.5 in.  SLEEP ARCHITECTURE: The patient had a total sleep time of only 192 minutes, with no slow-wave sleep and only 43 minutes of REM. Sleep onset latency was mildly prolonged at 47 minutes, and REM onset was normal at 58 minutes. Sleep efficiency was very poor at 51%.  RESPIRATORY DATA: The patient was found to have 15 apneas and 81 obstructive hypopneas, giving him an AHI of 30 events per hour. The events occurred in all body positions, but were accentuated during REM. There was moderate snoring noted throughout.  OXYGEN DATA: There was oxygen desaturation as low as 74% with the patient's obstructive events  CARDIAC DATA: The patient remained in normal sinus rhythm throughout the night, however he did have one 8 beat run of supraventricular tachycardia, with a rate of approximately 160 beats per minute.  MOVEMENT/PARASOMNIA: No significant limb movements or abnormal behaviors were noted.  IMPRESSION/ RECOMMENDATION:    1) moderate obstructive sleep apnea, with an AHI of 30 events per hour and oxygen desaturation as low as 74%. Treatment for this degree of sleep apnea can include a trial of weight loss alone, upper airway surgery, dental appliance, and also CPAP. Clinical correlation is suggested.  2) one 8 beat run of supraventricular tachycardia was noted during the night, with a rate of approximately 160 beats per minute.    [place], American Board of Sleep Medicine  ELECTRONICALLY SIGNED ON:   04/09/2013, 1:51 PM [place] PH: [phone]   FX: [phone] [place]

## 2013-04-09 NOTE — Telephone Encounter (Signed)
Pt needs ov to review sleep study results.  

## 2013-04-10 NOTE — Telephone Encounter (Signed)
Pt scheduled to see Dr Gwenette Greet Friday 04/18/13 at 430 to review sleep study

## 2013-04-11 DIAGNOSIS — M25519 Pain in unspecified shoulder: Secondary | ICD-10-CM | POA: Diagnosis not present

## 2013-04-11 DIAGNOSIS — M25669 Stiffness of unspecified knee, not elsewhere classified: Secondary | ICD-10-CM | POA: Diagnosis not present

## 2013-04-11 DIAGNOSIS — M19019 Primary osteoarthritis, unspecified shoulder: Secondary | ICD-10-CM | POA: Diagnosis not present

## 2013-04-11 DIAGNOSIS — Z96619 Presence of unspecified artificial shoulder joint: Secondary | ICD-10-CM | POA: Diagnosis not present

## 2013-04-14 ENCOUNTER — Other Ambulatory Visit: Payer: Self-pay | Admitting: *Deleted

## 2013-04-14 DIAGNOSIS — J441 Chronic obstructive pulmonary disease with (acute) exacerbation: Secondary | ICD-10-CM

## 2013-04-14 DIAGNOSIS — J449 Chronic obstructive pulmonary disease, unspecified: Secondary | ICD-10-CM

## 2013-04-14 MED ORDER — ACLIDINIUM BROMIDE 400 MCG/ACT IN AEPB: 1.0000 | INHALATION_SPRAY | Freq: Two times a day (BID) | RESPIRATORY_TRACT | Status: DC

## 2013-04-14 NOTE — Telephone Encounter (Signed)
COPD medication changed from spiriva to [place] with providers written order due to pt having formulary change, Spiriva no longer a covered medication.

## 2013-04-15 DIAGNOSIS — M25669 Stiffness of unspecified knee, not elsewhere classified: Secondary | ICD-10-CM | POA: Diagnosis not present

## 2013-04-15 DIAGNOSIS — M19019 Primary osteoarthritis, unspecified shoulder: Secondary | ICD-10-CM | POA: Diagnosis not present

## 2013-04-15 DIAGNOSIS — M25519 Pain in unspecified shoulder: Secondary | ICD-10-CM | POA: Diagnosis not present

## 2013-04-15 DIAGNOSIS — Z96619 Presence of unspecified artificial shoulder joint: Secondary | ICD-10-CM | POA: Diagnosis not present

## 2013-04-17 DIAGNOSIS — M19019 Primary osteoarthritis, unspecified shoulder: Secondary | ICD-10-CM | POA: Diagnosis not present

## 2013-04-17 DIAGNOSIS — M25519 Pain in unspecified shoulder: Secondary | ICD-10-CM | POA: Diagnosis not present

## 2013-04-17 DIAGNOSIS — M25669 Stiffness of unspecified knee, not elsewhere classified: Secondary | ICD-10-CM | POA: Diagnosis not present

## 2013-04-17 DIAGNOSIS — Z96619 Presence of unspecified artificial shoulder joint: Secondary | ICD-10-CM | POA: Diagnosis not present

## 2013-04-18 ENCOUNTER — Ambulatory Visit (INDEPENDENT_AMBULATORY_CARE_PROVIDER_SITE_OTHER): Payer: Medicare Other | Admitting: Pulmonary Disease

## 2013-04-18 ENCOUNTER — Encounter: Payer: Self-pay | Admitting: Pulmonary Disease

## 2013-04-18 VITALS — BP 142/88 | HR 71 | Temp 97.9°F | Ht 73.0 in | Wt 316.8 lb

## 2013-04-18 DIAGNOSIS — G4733 Obstructive sleep apnea (adult) (pediatric): Secondary | ICD-10-CM

## 2013-04-18 NOTE — Progress Notes (Signed)
   Subjective:    [name], male    DOB: 10/31/1945, 68 y.o.   MRN: [id number]  HPI The patient comes in today for followup of his recent sleep study. He was found to have moderate OSA, with an AHI of 30 events per hour and oxygen desaturation as low as 74%. I have reviewed the study with him in detail, and answered all of his questions.   Review of Systems  Constitutional: Negative for fever and unexpected weight change.  HENT: Negative for congestion, dental problem, ear pain, nosebleeds, postnasal drip, rhinorrhea, sinus pressure, sneezing, sore throat and trouble swallowing.   Eyes: Negative for redness and itching.  Respiratory: Negative for cough, chest tightness, shortness of breath and wheezing.   Cardiovascular: Negative for palpitations and leg swelling.  Gastrointestinal: Negative for nausea and vomiting.  Genitourinary: Negative for dysuria.  Musculoskeletal: Negative for joint swelling.  Skin: Negative for rash.  Neurological: Negative for headaches.  Hematological: Does not bruise/bleed easily.  Psychiatric/Behavioral: Negative for dysphoric mood. The patient is not nervous/anxious.        Objective:   Physical Exam Morbidly obese male in no acute distress Nose without purulence or discharge noted Neck without lymphadenopathy or thyromegaly Lower extremities with mild edema, no cyanosis Alert, oriented, moves all 4 extremities.       Assessment & Plan:

## 2013-04-18 NOTE — Assessment & Plan Note (Signed)
The patient has been diagnosed with moderate obstructive sleep apnea by his recent sleep study, along with significant oxygen desaturation. I have recommended that he consider a trial of CPAP while working aggressively on weight loss. The patient is agreeable to this approach. I will set the patient up on cpap at a moderate pressure level to allow for desensitization, and will troubleshoot the device over the next 4-6weeks if needed.  The pt is to call me if having issues with tolerance.  Will then optimize the pressure once patient is able to wear cpap on a consistent basis.

## 2013-04-18 NOTE — Patient Instructions (Signed)
Will setup for cpap at a moderate pressure.  Please call if having issues with tolerance. Work on weight loss.  followup with me again in 8 weeks.

## 2013-04-21 DIAGNOSIS — M25519 Pain in unspecified shoulder: Secondary | ICD-10-CM | POA: Diagnosis not present

## 2013-04-21 DIAGNOSIS — M19019 Primary osteoarthritis, unspecified shoulder: Secondary | ICD-10-CM | POA: Diagnosis not present

## 2013-04-21 DIAGNOSIS — M25669 Stiffness of unspecified knee, not elsewhere classified: Secondary | ICD-10-CM | POA: Diagnosis not present

## 2013-04-21 DIAGNOSIS — Z96619 Presence of unspecified artificial shoulder joint: Secondary | ICD-10-CM | POA: Diagnosis not present

## 2013-04-23 DIAGNOSIS — M19019 Primary osteoarthritis, unspecified shoulder: Secondary | ICD-10-CM | POA: Diagnosis not present

## 2013-04-23 DIAGNOSIS — Z96619 Presence of unspecified artificial shoulder joint: Secondary | ICD-10-CM | POA: Diagnosis not present

## 2013-04-23 DIAGNOSIS — M25669 Stiffness of unspecified knee, not elsewhere classified: Secondary | ICD-10-CM | POA: Diagnosis not present

## 2013-04-23 DIAGNOSIS — M25519 Pain in unspecified shoulder: Secondary | ICD-10-CM | POA: Diagnosis not present

## 2013-04-25 DIAGNOSIS — M25669 Stiffness of unspecified knee, not elsewhere classified: Secondary | ICD-10-CM | POA: Diagnosis not present

## 2013-04-25 DIAGNOSIS — M19019 Primary osteoarthritis, unspecified shoulder: Secondary | ICD-10-CM | POA: Diagnosis not present

## 2013-04-25 DIAGNOSIS — M25519 Pain in unspecified shoulder: Secondary | ICD-10-CM | POA: Diagnosis not present

## 2013-04-25 DIAGNOSIS — Z96619 Presence of unspecified artificial shoulder joint: Secondary | ICD-10-CM | POA: Diagnosis not present

## 2013-04-28 DIAGNOSIS — M25519 Pain in unspecified shoulder: Secondary | ICD-10-CM | POA: Diagnosis not present

## 2013-04-28 DIAGNOSIS — Z96619 Presence of unspecified artificial shoulder joint: Secondary | ICD-10-CM | POA: Diagnosis not present

## 2013-04-28 DIAGNOSIS — M19019 Primary osteoarthritis, unspecified shoulder: Secondary | ICD-10-CM | POA: Diagnosis not present

## 2013-04-28 DIAGNOSIS — M25669 Stiffness of unspecified knee, not elsewhere classified: Secondary | ICD-10-CM | POA: Diagnosis not present

## 2013-05-02 DIAGNOSIS — M25519 Pain in unspecified shoulder: Secondary | ICD-10-CM | POA: Diagnosis not present

## 2013-05-02 DIAGNOSIS — M19019 Primary osteoarthritis, unspecified shoulder: Secondary | ICD-10-CM | POA: Diagnosis not present

## 2013-05-02 DIAGNOSIS — M25669 Stiffness of unspecified knee, not elsewhere classified: Secondary | ICD-10-CM | POA: Diagnosis not present

## 2013-05-02 DIAGNOSIS — Z96619 Presence of unspecified artificial shoulder joint: Secondary | ICD-10-CM | POA: Diagnosis not present

## 2013-05-05 DIAGNOSIS — Z96619 Presence of unspecified artificial shoulder joint: Secondary | ICD-10-CM | POA: Diagnosis not present

## 2013-05-05 DIAGNOSIS — M25669 Stiffness of unspecified knee, not elsewhere classified: Secondary | ICD-10-CM | POA: Diagnosis not present

## 2013-05-05 DIAGNOSIS — M25519 Pain in unspecified shoulder: Secondary | ICD-10-CM | POA: Diagnosis not present

## 2013-05-05 DIAGNOSIS — M19019 Primary osteoarthritis, unspecified shoulder: Secondary | ICD-10-CM | POA: Diagnosis not present

## 2013-05-05 DIAGNOSIS — Z96659 Presence of unspecified artificial knee joint: Secondary | ICD-10-CM | POA: Diagnosis not present

## 2013-05-07 DIAGNOSIS — M19019 Primary osteoarthritis, unspecified shoulder: Secondary | ICD-10-CM | POA: Diagnosis not present

## 2013-05-07 DIAGNOSIS — M25519 Pain in unspecified shoulder: Secondary | ICD-10-CM | POA: Diagnosis not present

## 2013-05-07 DIAGNOSIS — Z96659 Presence of unspecified artificial knee joint: Secondary | ICD-10-CM | POA: Diagnosis not present

## 2013-05-07 DIAGNOSIS — M25669 Stiffness of unspecified knee, not elsewhere classified: Secondary | ICD-10-CM | POA: Diagnosis not present

## 2013-05-07 DIAGNOSIS — Z96619 Presence of unspecified artificial shoulder joint: Secondary | ICD-10-CM | POA: Diagnosis not present

## 2013-05-09 DIAGNOSIS — Z96619 Presence of unspecified artificial shoulder joint: Secondary | ICD-10-CM | POA: Diagnosis not present

## 2013-05-09 DIAGNOSIS — M25519 Pain in unspecified shoulder: Secondary | ICD-10-CM | POA: Diagnosis not present

## 2013-05-09 DIAGNOSIS — M25669 Stiffness of unspecified knee, not elsewhere classified: Secondary | ICD-10-CM | POA: Diagnosis not present

## 2013-05-09 DIAGNOSIS — M19019 Primary osteoarthritis, unspecified shoulder: Secondary | ICD-10-CM | POA: Diagnosis not present

## 2013-05-28 ENCOUNTER — Other Ambulatory Visit: Payer: Self-pay | Admitting: Family Medicine

## 2013-05-30 ENCOUNTER — Ambulatory Visit (INDEPENDENT_AMBULATORY_CARE_PROVIDER_SITE_OTHER): Payer: Medicare Other | Admitting: Pulmonary Disease

## 2013-05-30 ENCOUNTER — Encounter: Payer: Self-pay | Admitting: Pulmonary Disease

## 2013-05-30 VITALS — BP 168/100 | HR 71 | Temp 98.0°F | Ht 73.0 in | Wt 323.0 lb

## 2013-05-30 DIAGNOSIS — G4733 Obstructive sleep apnea (adult) (pediatric): Secondary | ICD-10-CM

## 2013-05-30 NOTE — Assessment & Plan Note (Signed)
The patient is doing very well on CPAP, and his wife states that his sleep is much improved, and he is no longer sleepy with driving. His download shows excellent compliance and good control of his sleep apnea. I've asked the patient to work aggressively on weight loss, and to followup with me again in 6 months. I will leave him on his current setting since he is doing well and has adequate control of his OSA.

## 2013-05-30 NOTE — Progress Notes (Signed)
   Subjective:    [name], male    DOB: [birth year], 68 y.o.   MRN: [id number]  HPI The patient comes in today for followup of his obstructive sleep apnea. He is wearing CPAP compliantly, and is having no issues with his mask fit or pressure. He feels that he is sleeping much better, and his wife has not heard breakthrough snoring. She notes that he is not falling asleep all the time, and especially with driving.   Review of Systems  Constitutional: Negative for fever and unexpected weight change.  HENT: Negative for congestion, dental problem, ear pain, nosebleeds, postnasal drip, rhinorrhea, sinus pressure, sneezing, sore throat and trouble swallowing.   Eyes: Negative for redness and itching.  Respiratory: Negative for cough, chest tightness, shortness of breath and wheezing.   Cardiovascular: Negative for palpitations and leg swelling.  Gastrointestinal: Negative for nausea and vomiting.  Genitourinary: Negative for dysuria.  Musculoskeletal: Negative for joint swelling.  Skin: Negative for rash.  Neurological: Negative for headaches.  Hematological: Does not bruise/bleed easily.  Psychiatric/Behavioral: Negative for dysphoric mood. The patient is not nervous/anxious.        Objective:   Physical Exam Obese male in no acute distress Nose without purulence or discharge noted No skin breakdown or pressure necrosis from the CPAP Neck without lymphadenopathy or thyromegaly Lower extremities with edema noted, no cyanosis Alert and oriented, moves all 4 extremities. Does not appear to be sleepy.       Assessment & Plan:

## 2013-05-30 NOTE — Patient Instructions (Signed)
Continue on CPAP, and keep up with mask cushion changes and supplies Work on weight loss Followup with me in 6 months, but call if having issues.

## 2013-06-02 DIAGNOSIS — H2510 Age-related nuclear cataract, unspecified eye: Secondary | ICD-10-CM | POA: Diagnosis not present

## 2013-07-02 ENCOUNTER — Telehealth: Payer: Self-pay

## 2013-07-02 NOTE — Telephone Encounter (Signed)
Patient walked into clinic today around 4:25 pm  with complaints of elevated blood pressure.  States that he has been asympatomatic.   Last one taken at [facility] was 190/103.  States that he has been taking blood pressure medications as prescribed.  Currently needs refill on Lasix, but has approximately 6-7 tablets left.    07/02/2013 BP: 150/82 P: 82 O2 Sat: 94%    Patient was encouraged to come in for an acute visit with Dr. Etter Sjogren.  Appointment scheduled 07/03/13 @ 2:30pm.

## 2013-07-03 ENCOUNTER — Encounter: Payer: Self-pay | Admitting: Family Medicine

## 2013-07-03 ENCOUNTER — Ambulatory Visit (INDEPENDENT_AMBULATORY_CARE_PROVIDER_SITE_OTHER): Payer: Medicare Other | Admitting: Family Medicine

## 2013-07-03 VITALS — BP 170/90 | HR 84 | Temp 98.3°F | Ht 72.75 in | Wt 325.0 lb

## 2013-07-03 DIAGNOSIS — R7309 Other abnormal glucose: Secondary | ICD-10-CM

## 2013-07-03 DIAGNOSIS — J441 Chronic obstructive pulmonary disease with (acute) exacerbation: Secondary | ICD-10-CM

## 2013-07-03 DIAGNOSIS — R739 Hyperglycemia, unspecified: Secondary | ICD-10-CM

## 2013-07-03 DIAGNOSIS — I10 Essential (primary) hypertension: Secondary | ICD-10-CM

## 2013-07-03 MED ORDER — ONETOUCH DELICA LANCETS FINE MISC: Status: AC

## 2013-07-03 MED ORDER — DILTIAZEM HCL ER COATED BEADS 240 MG PO CP24: 240.0000 mg | ORAL_CAPSULE | Freq: Every day | ORAL | Status: DC

## 2013-07-03 MED ORDER — TIOTROPIUM BROMIDE MONOHYDRATE 18 MCG IN CAPS: ORAL_CAPSULE | RESPIRATORY_TRACT | Status: DC

## 2013-07-03 MED ORDER — GLUCOSE BLOOD VI STRP: ORAL_STRIP | Status: DC

## 2013-07-03 NOTE — Progress Notes (Signed)
Subjective:    Patient here for follow-up of elevated blood pressure.  [facility]   He is not exercising and is not adherent to a low-salt diet.  Blood pressure is not well controlled at home. Cardiac symptoms: none. Patient denies: chest pain, chest pressure/discomfort, claudication, dyspnea, exertional chest pressure/discomfort, fatigue, irregular heart beat, lower extremity edema, near-syncope, orthopnea, palpitations, paroxysmal nocturnal dyspnea, syncope and tachypnea. Cardiovascular risk factors: advanced age (older than 37 for men, 24 for women), hypertension, male gender, obesity (BMI >= 30 kg/m2) and sedentary lifestyle. Use of agents associated with hypertension: none. History of target organ damage: none. Pt also found to have BS [facility].   The following portions of the patient's history were reviewed and updated as appropriate:  He  has a past medical history of Hyperlipidemia; Hypertension; Cough; B12 deficiency; Lower extremity edema; COPD (chronic obstructive pulmonary disease) (01/2011); Arthritis; and Secondary pulmonary hypertension (01/2011). He  does not have any pertinent problems on file. He  has past surgical history that includes Finger surgery (Right, 11); Vasectomy; Total shoulder arthroplasty (Right, 01/21/2013); Colonoscopy w/ polypectomy (12/19/07); and transthoracic echocardiogram (01/2011). His family history includes CAD in his mother; Cancer in his father; Epilepsy in his son. He  reports that he has never smoked. He has never used smokeless tobacco. He reports that he drinks alcohol. He reports that he does not use illicit drugs. He has a current medication list which includes the following prescription(s): albuterol, aspirin, vitamin d-3, furosemide, losartan, naproxen sodium, vitamin b-12, diltiazem, glucose blood, onetouch delica lancets fine, and tiotropium. Current Outpatient Prescriptions on File Prior to Visit  Medication Sig Dispense  Refill  . albuterol (PROVENTIL HFA;VENTOLIN HFA) 108 (90 BASE) MCG/ACT inhaler Inhale 2 puffs into the lungs every 6 (six) hours as needed for wheezing or shortness of breath.      Marland Kitchen aspirin 81 MG tablet Take 81 mg by mouth daily.      . Cholecalciferol (VITAMIN D-3) 1000 UNITS CAPS Take 1,000 Units by mouth.      . furosemide (LASIX) 20 MG tablet 1 tab by mouth daily--repeat labs are due now  30 tablet  0  . losartan (COZAAR) 100 MG tablet Take 1 tablet (100 mg total) by mouth daily.  90 tablet  3  . naproxen sodium (ANAPROX) 220 MG tablet Take 220 mg by mouth 2 (two) times daily with a meal.      . vitamin B-12 (CYANOCOBALAMIN) 1000 MCG tablet Take 1,000 mcg by mouth daily.       No current facility-administered medications on file prior to visit.   He has No Known Allergies..  Review of Systems Pertinent items are noted in HPI.     Objective:    BP 170/90  Pulse 84  Temp(Src) 98.3 F (36.8 C) (Oral)  Ht 6' 0.75" (1.848 m)  Wt 325 lb (147.419 kg)  BMI 43.17 kg/m2  SpO2 94% General appearance: alert, cooperative, appears stated age and no distress Neck: no adenopathy, supple, symmetrical, trachea midline and thyroid not enlarged, symmetric, no tenderness/mass/nodules Lungs: clear to auscultation bilaterally Heart: S1, S2 normal Extremities: extremities normal, atraumatic, no cyanosis or edema    Assessment:    Hypertension, stage 2 . Evidence of target organ damage: none.    Plan:    Medication: increase to cardizem 240. Dietary sodium restriction. Regular aerobic exercise. Follow up: 3 weeks and as needed.   1. COPD    - tiotropium (SPIRIVA HANDIHALER) 18 MCG inhalation  capsule; 1 cap qd  Dispense: 30 capsule; Refill: 12  2. Hyperglycemia Check labs-- most likely has dm--- RN spoke to pt about glucometer and diet - Basic metabolic panel; Future - CBC with Differential; Future - Hemoglobin A1c; Future - Hepatic function panel; Future - Lipid panel; Future -  POCT urinalysis dipstick; Future  3. HTN (hypertension) \ - Basic metabolic panel; Future - CBC with Differential; Future - Hemoglobin A1c; Future - Hepatic function panel; Future - Lipid panel; Future - POCT urinalysis dipstick; Future

## 2013-07-03 NOTE — Patient Instructions (Addendum)
Diabetes and Standards of Medical Care  Diabetes is complicated. You may find that your diabetes team includes a dietitian, nurse, diabetes educator, eye doctor, and more. To help everyone know what is going on and to help you get the care you deserve, the following schedule of care was developed to help keep you on track. Below are the tests, exams, vaccines, medicines, education, and plans you will need. HbA1c test This test shows how well you have controlled your glucose over the past 2 3 months. It is used to see if your diabetes management plan needs to be adjusted.   It is performed at least 2 times a year if you are meeting treatment goals.  It is performed 4 times a year if therapy has changed or if you are not meeting treatment goals. Blood pressure test  This test is performed at every routine medical visit. The goal is less than 140/90 mmHg for most people, but 130/80 mmHg in some cases. Ask your health care provider about your goal. Dental exam  Follow up with the dentist regularly. Eye exam  If you are diagnosed with type 1 diabetes as a child, get an exam upon reaching the age of 42 years or older and have had diabetes for 3 5 years. Yearly eye exams are recommended after that initial eye exam.  If you are diagnosed with type 1 diabetes as an adult, get an exam within 5 years of diagnosis and then yearly.  If you are diagnosed with type 2 diabetes, get an exam as soon as possible after the diagnosis and then yearly. Foot care exam  Visual foot exams are performed at every routine medical visit. The exams check for cuts, injuries, or other problems with the feet.  A comprehensive foot exam should be done yearly. This includes visual inspection as well as assessing foot pulses and testing for loss of sensation.  Check your feet nightly for cuts, injuries, or other problems with your feet. Tell your health care provider if anything is not healing. Kidney function test (urine  microalbumin)  This test is performed once a year.  Type 1 diabetes: The first test is performed 5 years after diagnosis.  Type 2 diabetes: The first test is performed at the time of diagnosis.  A serum creatinine and estimated glomerular filtration rate (eGFR) test is done once a year to assess the level of chronic kidney disease (CKD), if present. Lipid profile (cholesterol, HDL, LDL, triglycerides)  Performed every 5 years for most people.  The goal for LDL is less than 100 mg/dL. If you are at high risk, the goal is less than 70 mg/dL.  The goal for HDL is 40 mg/dL 50 mg/dL for men and 50 mg/dL 60 mg/dL for women. An HDL cholesterol of 60 mg/dL or higher gives some protection against heart disease.  The goal for triglycerides is less than 150 mg/dL. Influenza vaccine, pneumococcal vaccine, and hepatitis B vaccine  The influenza vaccine is recommended yearly.  The pneumococcal vaccine is generally given once in a lifetime. However, there are some instances when another vaccination is recommended. Check with your health care provider.  The hepatitis B vaccine is also recommended for adults with diabetes. Diabetes self-management education  Education is recommended at diagnosis and ongoing as needed. Treatment plan  Your treatment plan is reviewed at every medical visit. Document Released: 12/25/2008 Document Revised: 10/30/2012 Document Reviewed: 07/30/2012 [facility] Patient Information 2014 [place].  Diabetes Meal Planning Guide The diabetes meal planning  guide is a tool to help you plan your meals and snacks. It is important for people with diabetes to manage their blood glucose (sugar) levels. Choosing the right foods and the right amounts throughout your day will help control your blood glucose. Eating right can even help you improve your blood pressure and reach or maintain a healthy weight. CARBOHYDRATE COUNTING MADE EASY When you eat carbohydrates, they turn to  sugar. This raises your blood glucose level. Counting carbohydrates can help you control this level so you feel better. When you plan your meals by counting carbohydrates, you can have more flexibility in what you eat and balance your medicine with your food intake. Carbohydrate counting simply means adding up the total amount of carbohydrate grams in your meals and snacks. Try to eat about the same amount at each meal. Foods with carbohydrates are listed below. Each portion below is 1 carbohydrate serving or 15 grams of carbohydrates. Ask your dietician how many grams of carbohydrates you should eat at each meal or snack. Grains and Starches  1 slice bread.   English muffin or hotdog/hamburger bun.   cup cold cereal (unsweetened).   cup cooked pasta or rice.   cup starchy vegetables (corn, potatoes, peas, beans, winter squash).  1 tortilla (6 inches).   bagel.  1 waffle or pancake (size of a CD).   cup cooked cereal.  4 to 6 small crackers. *Whole grain is recommended. Fruit  1 cup fresh unsweetened berries, melon, papaya, pineapple.  1 small fresh fruit.   banana or mango.   cup fruit juice (4 oz unsweetened).   cup canned fruit in natural juice or water.  2 tbs dried fruit.  12 to 15 grapes or cherries. Milk and Yogurt  1 cup fat-free or 1% milk.  1 cup soy milk.  6 oz light yogurt with sugar-free sweetener.  6 oz low-fat soy yogurt.  6 oz plain yogurt. Vegetables  1 cup raw or  cup cooked is counted as 0 carbohydrates or a "free" food.  If you eat 3 or more servings at 1 meal, count them as 1 carbohydrate serving. Other Carbohydrates   oz chips or pretzels.   cup ice cream or frozen yogurt.   cup sherbet or sorbet.  2 inch square cake, no frosting.  1 tbs honey, sugar, jam, jelly, or syrup.  2 small cookies.  3 squares of graham crackers.  3 cups popcorn.  6 crackers.  1 cup broth-based soup.  Count 1 cup casserole or other  mixed foods as 2 carbohydrate servings.  Foods with less than 20 calories in a serving may be counted as 0 carbohydrates or a "free" food. You may want to purchase a book or computer software that lists the carbohydrate gram counts of different foods. In addition, the nutrition facts panel on the labels of the foods you eat are a good source of this information. The label will tell you how big the serving size is and the total number of carbohydrate grams you will be eating per serving. Divide this number by 15 to obtain the number of carbohydrate servings in a portion. Remember, 1 carbohydrate serving equals 15 grams of carbohydrate. SERVING SIZES Measuring foods and serving sizes helps you make sure you are getting the right amount of food. The list below tells how big or small some common serving sizes are.  1 oz.........4 stacked dice.  3 oz.........Deck of cards.  1 tsp........Tip of little finger.  1 tbs........Thumb.    2 tbs........Golf ball.   cup.......Half of a fist.  1 cup........A fist. SAMPLE DIABETES MEAL PLAN Below is a sample meal plan that includes foods from the grain and starches, dairy, vegetable, fruit, and meat groups. A dietician can individualize a meal plan to fit your calorie needs and tell you the number of servings needed from each food group. However, controlling the total amount of carbohydrates in your meal or snack is more important than making sure you include all of the food groups at every meal. You may interchange carbohydrate containing foods (dairy, starches, and fruits). The meal plan below is an example of a 2000 calorie diet using carbohydrate counting. This meal plan has 17 carbohydrate servings. Breakfast  1 cup oatmeal (2 carb servings).   cup light yogurt (1 carb serving).  1 cup blueberries (1 carb serving).   cup almonds. Snack  1 large apple (2 carb servings).  1 low-fat string cheese stick. Lunch  Chicken breast salad.  1 cup  spinach.   cup chopped tomatoes.  2 oz chicken breast, sliced.  2 tbs low-fat Italian dressing.  12 whole-wheat crackers (2 carb servings).  12 to 15 grapes (1 carb serving).  1 cup low-fat milk (1 carb serving). Snack  1 cup carrots.   cup hummus (1 carb serving). Dinner  3 oz broiled salmon.  1 cup brown rice (3 carb servings). Snack  1  cups steamed broccoli (1 carb serving) drizzled with 1 tsp olive oil and lemon juice.  1 cup light pudding (2 carb servings). DIABETES MEAL PLANNING WORKSHEET Your dietician can use this worksheet to help you decide how many servings of foods and what types of foods are right for you.  BREAKFAST Food Group and Servings / Carb Servings Grain/Starches __________________________________ Dairy __________________________________________ Vegetable ______________________________________ Fruit ___________________________________________ Meat __________________________________________ Fat ____________________________________________ LUNCH Food Group and Servings / Carb Servings Grain/Starches ___________________________________ Dairy ___________________________________________ Fruit ____________________________________________ Meat ___________________________________________ Fat _____________________________________________ DINNER Food Group and Servings / Carb Servings Grain/Starches ___________________________________ Dairy ___________________________________________ Fruit ____________________________________________ Meat ___________________________________________ Fat _____________________________________________ SNACKS Food Group and Servings / Carb Servings Grain/Starches ___________________________________ Dairy ___________________________________________ Vegetable _______________________________________ Fruit ____________________________________________ Meat ___________________________________________ Fat  _____________________________________________ DAILY TOTALS Starches _________________________ Vegetable ________________________ Fruit ____________________________ Dairy ____________________________ Meat ____________________________ Fat ______________________________ Document Released: 11/24/2004 Document Revised: 05/22/2011 Document Reviewed: 10/05/2008 [employer] Patient Information 2014 [employer].  

## 2013-07-03 NOTE — Progress Notes (Signed)
Pre visit review using our clinic review tool, if applicable. No additional management support is needed unless otherwise documented below in the visit note. 

## 2013-07-04 ENCOUNTER — Other Ambulatory Visit: Payer: Self-pay | Admitting: Family Medicine

## 2013-07-04 ENCOUNTER — Telehealth: Payer: Self-pay

## 2013-07-04 ENCOUNTER — Other Ambulatory Visit (INDEPENDENT_AMBULATORY_CARE_PROVIDER_SITE_OTHER): Payer: Medicare Other

## 2013-07-04 ENCOUNTER — Telehealth: Payer: Self-pay | Admitting: Family Medicine

## 2013-07-04 DIAGNOSIS — I10 Essential (primary) hypertension: Secondary | ICD-10-CM | POA: Diagnosis not present

## 2013-07-04 DIAGNOSIS — E119 Type 2 diabetes mellitus without complications: Secondary | ICD-10-CM | POA: Insufficient documentation

## 2013-07-04 DIAGNOSIS — IMO0002 Reserved for concepts with insufficient information to code with codable children: Secondary | ICD-10-CM

## 2013-07-04 DIAGNOSIS — R739 Hyperglycemia, unspecified: Secondary | ICD-10-CM

## 2013-07-04 DIAGNOSIS — R7309 Other abnormal glucose: Secondary | ICD-10-CM

## 2013-07-04 DIAGNOSIS — E1165 Type 2 diabetes mellitus with hyperglycemia: Secondary | ICD-10-CM

## 2013-07-04 DIAGNOSIS — E1122 Type 2 diabetes mellitus with diabetic chronic kidney disease: Secondary | ICD-10-CM | POA: Insufficient documentation

## 2013-07-04 LAB — CBC WITH DIFFERENTIAL/PLATELET
Basophils Absolute: 0 10*3/uL (ref 0.0–0.1)
Basophils Relative: 0.6 % (ref 0.0–3.0)
Eosinophils Absolute: 0.2 10*3/uL (ref 0.0–0.7)
Eosinophils Relative: 3.3 % (ref 0.0–5.0)
HCT: 44.7 % (ref 39.0–52.0)
HEMOGLOBIN: 14.9 g/dL (ref 13.0–17.0)
LYMPHS ABS: 1.6 10*3/uL (ref 0.7–4.0)
Lymphocytes Relative: 24.6 % (ref 12.0–46.0)
MCHC: 33.3 g/dL (ref 30.0–36.0)
MCV: 86.8 fl (ref 78.0–100.0)
Monocytes Absolute: 0.6 10*3/uL (ref 0.1–1.0)
Monocytes Relative: 9 % (ref 3.0–12.0)
NEUTROS ABS: 4.2 10*3/uL (ref 1.4–7.7)
Neutrophils Relative %: 62.5 % (ref 43.0–77.0)
Platelets: 189 10*3/uL (ref 150.0–400.0)
RBC: 5.15 Mil/uL (ref 4.22–5.81)
RDW: 13.9 % (ref 11.5–14.6)
WBC: 6.6 10*3/uL (ref 4.5–10.5)

## 2013-07-04 LAB — HEPATIC FUNCTION PANEL
ALBUMIN: 3.7 g/dL (ref 3.5–5.2)
ALT: 16 U/L (ref 0–53)
AST: 16 U/L (ref 0–37)
Alkaline Phosphatase: 63 U/L (ref 39–117)
Bilirubin, Direct: 0.1 mg/dL (ref 0.0–0.3)
Total Bilirubin: 1.2 mg/dL (ref 0.3–1.2)
Total Protein: 6.6 g/dL (ref 6.0–8.3)

## 2013-07-04 LAB — BASIC METABOLIC PANEL
BUN: 18 mg/dL (ref 6–23)
CO2: 32 mEq/L (ref 19–32)
Calcium: 9.3 mg/dL (ref 8.4–10.5)
Chloride: 103 mEq/L (ref 96–112)
Creatinine, Ser: 1.3 mg/dL (ref 0.4–1.5)
GFR: 59.9 mL/min — AB (ref >60.00)
GLUCOSE: 80 mg/dL (ref 70–99)
Potassium: 3.7 mEq/L (ref 3.5–5.1)
Sodium: 141 mEq/L (ref 135–145)

## 2013-07-04 LAB — POCT URINALYSIS DIPSTICK
Bilirubin, UA: NEGATIVE
Blood, UA: NEGATIVE
Glucose, UA: NEGATIVE
KETONES UA: NEGATIVE
LEUKOCYTES UA: NEGATIVE
Nitrite, UA: NEGATIVE
Protein, UA: NEGATIVE
Spec Grav, UA: 1.01
Urobilinogen, UA: 0.2
pH, UA: 7.5

## 2013-07-04 LAB — LIPID PANEL
Cholesterol: 155 mg/dL (ref 0–200)
HDL: 45.2 mg/dL (ref >39.00)
LDL Cholesterol: 89 mg/dL (ref 0–99)
Total CHOL/HDL Ratio: 3
Triglycerides: 102 mg/dL (ref 0.0–149.0)
VLDL: 20.4 mg/dL (ref 0.0–40.0)

## 2013-07-04 LAB — HEMOGLOBIN A1C: Hgb A1c MFr Bld: 5.3 % (ref 4.6–6.5)

## 2013-07-04 MED ORDER — FUROSEMIDE 20 MG PO TABS: ORAL_TABLET | ORAL | Status: DC

## 2013-07-04 NOTE — Telephone Encounter (Signed)
[facility] [address]  Patient made aware.  No further questions or concerns voiced.

## 2013-07-04 NOTE — Telephone Encounter (Signed)
Caller name: Travis Daniels Relation to pt: self PCP: Call back number:[phone] [id number] [place] [place]   Reason for call:  [facility] original request for the test strips via fax to be re-coded (?) so medicare will pay for them.

## 2013-07-04 NOTE — Telephone Encounter (Signed)
Dx code faxed on the Rx.      KP

## 2013-07-04 NOTE — Telephone Encounter (Signed)
Relevant patient education assigned to patient using Emmi. ° °

## 2013-07-04 NOTE — Telephone Encounter (Signed)
Original message routed to wrong person.

## 2013-07-07 ENCOUNTER — Telehealth: Payer: Self-pay

## 2013-07-07 MED ORDER — UMECLIDINIUM-VILANTEROL 62.5-25 MCG/INH IN AEPB: 1.0000 | INHALATION_SPRAY | Freq: Every day | RESPIRATORY_TRACT | Status: DC

## 2013-07-07 NOTE — Telephone Encounter (Signed)
anoro ellipta 1 puff qd  #1  11 refills

## 2013-07-07 NOTE — Telephone Encounter (Signed)
Rx sent, Medication list updated, and wife has bene made aware that the Rx has been sent.     KP

## 2013-07-07 NOTE — Telephone Encounter (Signed)
Tier exception for the Spiriva had been denied, the patient would have had to had failed two formulary alternatives: alternatives are Anoro Ellipta 62.5-25 MCG/INH; Incruse Ellipta 62.5 MCG/ACT. The patient is currently on [place] and he said it is not helping him, he said it does not make any difference when he takes this medication at all. Wife wants to know if he could be prescribed something different. The insurance company will not approve the Spiriva. Please advise      KP

## 2013-07-14 DIAGNOSIS — Z96619 Presence of unspecified artificial shoulder joint: Secondary | ICD-10-CM | POA: Diagnosis not present

## 2013-07-17 ENCOUNTER — Encounter: Payer: Self-pay | Admitting: Family Medicine

## 2013-07-17 ENCOUNTER — Ambulatory Visit (INDEPENDENT_AMBULATORY_CARE_PROVIDER_SITE_OTHER): Payer: Medicare Other | Admitting: Family Medicine

## 2013-07-17 VITALS — BP 150/92 | HR 71 | Temp 98.8°F | Wt 316.0 lb

## 2013-07-17 DIAGNOSIS — E1159 Type 2 diabetes mellitus with other circulatory complications: Secondary | ICD-10-CM | POA: Diagnosis not present

## 2013-07-17 DIAGNOSIS — B351 Tinea unguium: Secondary | ICD-10-CM | POA: Diagnosis not present

## 2013-07-17 DIAGNOSIS — B353 Tinea pedis: Secondary | ICD-10-CM

## 2013-07-17 DIAGNOSIS — I10 Essential (primary) hypertension: Secondary | ICD-10-CM | POA: Diagnosis not present

## 2013-07-17 MED ORDER — EFINACONAZOLE 10 % EX SOLN: CUTANEOUS | Status: DC

## 2013-07-17 MED ORDER — NYSTATIN 100000 UNIT/GM EX POWD: Freq: Four times a day (QID) | CUTANEOUS | Status: DC

## 2013-07-17 MED ORDER — DILTIAZEM HCL ER COATED BEADS 300 MG PO CP24: 300.0000 mg | ORAL_CAPSULE | Freq: Every day | ORAL | Status: DC

## 2013-07-17 NOTE — Progress Notes (Signed)
Pre visit review using our clinic review tool, if applicable. No additional management support is needed unless otherwise documented below in the visit note. 

## 2013-07-17 NOTE — Progress Notes (Signed)
   Subjective:    [name], male    DOB: [birth year], 68 y.o.   MRN: [id number]  Hypertension Pertinent negatives include no chest pain, headaches, palpitations or shortness of breath.  Diabetes Pertinent negatives for hypoglycemia include no dizziness or headaches. Pertinent negatives for diabetes include no chest pain, no fatigue, no polydipsia, no polyphagia and no polyuria.    HPI HYPERTENSION  Blood pressure range-not checking  Chest pain- no      Dyspnea- no Lightheadedness- no   Edema- no Other side effects - no   Medication compliance: good Low salt diet- no  DIABETES  Blood Sugar ranges-78-150  Polyuria- no New Visual problems- no Hypoglycemic symptoms- no Other side effects-no Medication compliance - good Last eye exam- due   HYPERLIPIDEMIA  Medication compliance- good RUQ pain-no  Muscle aches- no Other side effects-no    Review of Systems  Constitutional: Negative for diaphoresis, appetite change, fatigue and unexpected weight change.  Eyes: Negative for pain, redness and visual disturbance.  Respiratory: Negative for cough, chest tightness, shortness of breath and wheezing.   Cardiovascular: Negative for chest pain, palpitations and leg swelling.  Endocrine: Negative for cold intolerance, heat intolerance, polydipsia, polyphagia and polyuria.  Genitourinary: Negative for dysuria, frequency and difficulty urinating.  Neurological: Negative for dizziness, light-headedness, numbness and headaches.       Objective:   Physical Exam  Constitutional: He is oriented to person, place, and time. Vital signs are normal. He appears well-developed and well-nourished. He is sleeping.  HENT:  Head: Normocephalic and atraumatic.  Mouth/Throat: Oropharynx is clear and moist.  Eyes: EOM are normal. Pupils are equal, round, and reactive to light.  Neck: Normal range of motion. Neck supple. No thyromegaly present.  Cardiovascular: Normal rate and  regular rhythm.   No murmur heard. Pulmonary/Chest: Effort normal and breath sounds normal. No respiratory distress. He has no wheezes. He has no rales. He exhibits no tenderness.  Musculoskeletal: He exhibits no edema and no tenderness.  Neurological: He is alert and oriented to person, place, and time.  Skin: Skin is warm and dry.  Psychiatric: He has a normal mood and affect. His behavior is normal. Judgment and thought content normal.          Assessment & Plan:  1. Onychomycosis  - Efinaconazole (JUBLIA) 10 % SOLN; Apply qd x 48 weeks  Dispense: 4 mL; Refill: 11  2. Tinea pedis  - nystatin (MYCOSTATIN) powder; Apply topically 4 (four) times daily.  Dispense: 15 g; Refill: 0  3. HTN (hypertension) elevated - diltiazem (CARDIZEM CD) 300 MG 24 hr capsule; Take 1 capsule (300 mg total) by mouth daily.  Dispense: 30 capsule; Refill: 5  4. Type II or unspecified type diabetes mellitus with peripheral circulatory disorders, uncontrolled(250.72) See home bld sugars con't diet and exercise

## 2013-07-17 NOTE — Patient Instructions (Signed)
Ringworm, Nail A fungal infection of the nail (tinea unguium/onychomycosis) is common. It is common as the visible part of the nail is composed of dead cells which have no blood supply to help prevent infection. It occurs because fungi are everywhere and will pick any opportunity to grow on any dead material. Because nails are very slow growing they require up to 2 years of treatment with anti-fungal medications. The entire nail back to the base is infected. This includes approximately  of the nail which you cannot see. If your caregiver has prescribed a medication by mouth, take it every day and as directed. No progress will be seen for at least 6 to 9 months. Do not be disappointed! Because fungi live on dead cells with little or no exposure to blood supply, medication delivery to the infection is slow; thus the cure is slow. It is also why you can observe no progress in the first 6 months. The nail becoming cured is the base of the nail, as it has the blood supply. Topical medication such as creams and ointments are usually not effective. Important in successful treatment of nail fungus is closely following the medication regimen that your doctor prescribes. Sometimes you and your caregiver may elect to speed up this process by surgical removal of all the nails. Even this may still require 6 to 9 months of additional oral medications. See your caregiver as directed. Remember there will be no visible improvement for at least 6 months. See your caregiver sooner if other signs of infection (redness and swelling) develop. Document Released: 02/25/2000 Document Revised: 05/22/2011 Document Reviewed: 05/05/2008 [employer] Patient Information 2014 [employer].  

## 2013-07-18 ENCOUNTER — Telehealth: Payer: Self-pay | Admitting: *Deleted

## 2013-07-18 NOTE — Telephone Encounter (Signed)
Spoke with patient's wife and made her aware Dr.Lowne was out of the office and she is willing to wait until Monday for follow up. Will route to provider for new RX.      KP

## 2013-07-18 NOTE — Telephone Encounter (Signed)
Caller name:  Mylen Relation to pt:  self Call back number:  [phone] [facility] [address]  Reason for call:   Pt came by office.  He went to fill Efinaconazole (JUBLIA) 10 % SOLN last night and it is not covered by his insurance.  Cost is $530.  Pt requesting to see if we have coupons for this, or if there is something else you recommend.  Wife uses Formula 3 Professional Antifungal by [employer]. Not sure if this is covered by his insurance.

## 2013-07-21 ENCOUNTER — Ambulatory Visit: Payer: Medicare Other

## 2013-07-21 VITALS — BP 146/62 | HR 68

## 2013-07-21 DIAGNOSIS — Z0130 Encounter for examination of blood pressure without abnormal findings: Secondary | ICD-10-CM

## 2013-07-21 MED ORDER — TOLNAFTATE 1 % EX SOLN: 1.0000 "application " | Freq: Every day | CUTANEOUS | Status: DC

## 2013-07-21 NOTE — Telephone Encounter (Signed)
I did not see formula 3 in our system but we can call it in --- ife said instructions are the same

## 2013-07-21 NOTE — Progress Notes (Signed)
Patient brought in wife's wrist cuff from home for comparison during his nurse visit.  His blood pressure with wrist cuff was 176/129.  Blood pressure with office manual cuff was 146/62.  Patient was advised not to use wrist cuff to monitor his blood pressure as it provides inaccurate readings for patient.  It appears that the cuff is too small for patient's wrist, therefore, he was advised to invest in another machine that will fit his wrist or arm appropriately.   Patient stated understanding and agreed.

## 2013-07-21 NOTE — Telephone Encounter (Signed)
Spoke with wife and the Rx is called Tolnaftate external. She said it works for her. Rx has been sent. She also stated the patient's BP has still been running really high in the 200's over 100's. He is not having any symptoms. Dr. Etter Sjogren wanted his BP checked, scheduled apt for today at 2pm    KP

## 2013-07-30 ENCOUNTER — Telehealth: Payer: Self-pay

## 2013-07-30 NOTE — Telephone Encounter (Signed)
Per Dr Etter Sjogren, Kandice Hams, RN with [facility] will come to [place] to work with patient on DM Education. RN is allowed to meet in one of the unused exam rooms or the conference room. Ok per [facility]

## 2013-08-05 ENCOUNTER — Telehealth: Payer: Self-pay

## 2013-08-05 NOTE — Telephone Encounter (Signed)
Relevant patient education assigned to patient using Emmi. ° °

## 2013-08-14 ENCOUNTER — Encounter: Payer: Self-pay | Admitting: Family Medicine

## 2013-08-14 ENCOUNTER — Ambulatory Visit (INDEPENDENT_AMBULATORY_CARE_PROVIDER_SITE_OTHER): Payer: Medicare Other | Admitting: Family Medicine

## 2013-08-14 VITALS — BP 140/80 | HR 69 | Temp 98.4°F | Ht 79.5 in | Wt 315.6 lb

## 2013-08-14 DIAGNOSIS — E1149 Type 2 diabetes mellitus with other diabetic neurological complication: Secondary | ICD-10-CM | POA: Diagnosis not present

## 2013-08-14 DIAGNOSIS — I10 Essential (primary) hypertension: Secondary | ICD-10-CM | POA: Diagnosis not present

## 2013-08-14 NOTE — Progress Notes (Signed)
Subjective:    [name], male    DOB: June 07, 1945, 68 y.o.   MRN: [id number]  HPI Pt here f/u bp.  No complaints.  No sob, no cp.  No fatigue. Transferred from Dr Linda Hedges.    Review of Systems    as above  Past Medical History  Diagnosis Date  . Hyperlipidemia   . Hypertension   . 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 B12 deficiency   . Lower extremity edema   . COPD (chronic obstructive pulmonary disease) 01/2011    FVC 47%, FEV1 49%   . Arthritis   . Secondary pulmonary hypertension 01/2011    RV syst pressure 30-40 mm Hg on ECHO    History  Substance Use Topics  . Smoking status: Never Smoker   . Smokeless tobacco: Never Used     Comment: occ alcohol  . Alcohol Use: Yes     Comment: 2-3 drinks occasionally   Current Outpatient Prescriptions  Medication Sig Dispense Refill  . albuterol (PROVENTIL HFA;VENTOLIN HFA) 108 (90 BASE) MCG/ACT inhaler Inhale 2 puffs into the lungs every 6 (six) hours as needed for wheezing or shortness of breath.      Marland Kitchen aspirin 81 MG tablet Take 81 mg by mouth daily.      . Cholecalciferol (VITAMIN D-3) 1000 UNITS CAPS Take 1,000 Units by mouth.      . diltiazem (CARDIZEM CD) 300 MG 24 hr capsule Take 1 capsule (300 mg total) by mouth daily.  30 capsule  5  . Efinaconazole (JUBLIA) 10 % SOLN Apply qd x 48 weeks  4 mL  11  . furosemide (LASIX) 20 MG tablet 1 tablet (20 mg) by mouth daily.  30 tablet  5  . glucose blood (ONE TOUCH ULTRA TEST) test strip Check blood sugar once daily  100 each  12  . losartan (COZAAR) 100 MG tablet Take 1 tablet (100 mg total) by mouth daily.  90 tablet  3  . naproxen sodium (ANAPROX) 220 MG tablet Take 220 mg by mouth 2 (two) times daily with a meal.      . nystatin (MYCOSTATIN) powder Apply topically 4 (four) times daily.  15 g  0  . ONETOUCH DELICA LANCETS FINE MISC Check blood sugar daily  100 each  12  . tolnaftate (TINACTIN) 1 % external solution Apply 1 application  topically at bedtime.  10 mL  0  . Umeclidinium-Vilanterol 62.5-25 MCG/INH AEPB Inhale 1 puff into the lungs daily.  1 each  11  . vitamin B-12 (CYANOCOBALAMIN) 1000 MCG tablet Take 1,000 mcg by mouth daily.       No current facility-administered medications for this visit.   No Known Allergies  Objective:   Physical Exam BP 140/80  Pulse 69  Temp(Src) 98.4 F (36.9 C) (Oral)  Ht 6' 7.5" (2.019 m)  Wt 315 lb 9.6 oz (143.155 kg)  BMI 35.12 kg/m2  SpO2 96% General appearance: alert, cooperative, appears stated age and no distress Throat: lips, mucosa, and tongue normal; teeth and gums normal Neck: no adenopathy, no carotid bruit, no JVD, supple, symmetrical, trachea midline and thyroid not enlarged, symmetric, no tenderness/mass/nodules Lungs: clear to auscultation bilaterally Heart: S1, S2 normal  + murmur Extremities: extremities normal, atraumatic, no cyanosis or edema       Assessment & Plan:  1. Diabetes mellitus type 2 with neurological manifestations con't meds Check labs in 3 months  2. HTN (  hypertension) Stable con't meds

## 2013-08-14 NOTE — Progress Notes (Signed)
Pre visit review using our clinic review tool, if applicable. No additional management support is needed unless otherwise documented below in the visit note. 

## 2013-08-14 NOTE — Patient Instructions (Signed)

## 2013-08-18 ENCOUNTER — Telehealth: Payer: Self-pay | Admitting: Family Medicine

## 2013-08-18 MED ORDER — BETAMETHASONE DIPROPIONATE 0.05 % EX CREA: TOPICAL_CREAM | Freq: Two times a day (BID) | CUTANEOUS | Status: DC

## 2013-08-18 NOTE — Telephone Encounter (Signed)
Please advise if refill appropriate.      KP 

## 2013-08-18 NOTE — Telephone Encounter (Signed)
Ok to refill x1  With 3 refills

## 2013-08-18 NOTE — Telephone Encounter (Signed)
Rx sent      KP 

## 2013-08-18 NOTE — Telephone Encounter (Signed)
Pt came by requesting a refill on Betamethasone Dipropionate Cream USP, 0.05%.  Pt states last refill was authorized by MD he had in [place] on [address] for refill please.  Please advise.

## 2013-09-18 ENCOUNTER — Telehealth: Payer: Self-pay | Admitting: Family Medicine

## 2013-09-18 MED ORDER — BETAMETHASONE DIPROPIONATE 0.05 % EX CREA: TOPICAL_CREAM | Freq: Two times a day (BID) | CUTANEOUS | Status: DC

## 2013-09-18 NOTE — Telephone Encounter (Signed)
Med filled.  

## 2013-09-18 NOTE — Telephone Encounter (Signed)
Pt came by requesting a refill on Betamethasone DP AUG 0.05% CRM.  Please advise.

## 2013-11-08 ENCOUNTER — Other Ambulatory Visit: Payer: Self-pay | Admitting: Family Medicine

## 2013-11-18 ENCOUNTER — Ambulatory Visit: Payer: Medicare Other | Admitting: Family Medicine

## 2013-11-21 ENCOUNTER — Encounter: Payer: Self-pay | Admitting: Family Medicine

## 2013-11-21 ENCOUNTER — Ambulatory Visit (INDEPENDENT_AMBULATORY_CARE_PROVIDER_SITE_OTHER): Payer: Medicare Other | Admitting: Family Medicine

## 2013-11-21 VITALS — BP 166/93 | HR 61 | Temp 98.9°F | Wt 312.6 lb

## 2013-11-21 DIAGNOSIS — E785 Hyperlipidemia, unspecified: Secondary | ICD-10-CM | POA: Diagnosis not present

## 2013-11-21 DIAGNOSIS — I10 Essential (primary) hypertension: Secondary | ICD-10-CM | POA: Diagnosis not present

## 2013-11-21 DIAGNOSIS — B351 Tinea unguium: Secondary | ICD-10-CM | POA: Diagnosis not present

## 2013-11-21 DIAGNOSIS — Z23 Encounter for immunization: Secondary | ICD-10-CM | POA: Diagnosis not present

## 2013-11-21 DIAGNOSIS — E1159 Type 2 diabetes mellitus with other circulatory complications: Secondary | ICD-10-CM | POA: Diagnosis not present

## 2013-11-21 LAB — POCT URINALYSIS DIPSTICK
Bilirubin, UA: NEGATIVE
Blood, UA: NEGATIVE
Glucose, UA: NEGATIVE
KETONES UA: NEGATIVE
Leukocytes, UA: NEGATIVE
Nitrite, UA: NEGATIVE
PH UA: 6
PROTEIN UA: NEGATIVE
Spec Grav, UA: 1.025
Urobilinogen, UA: 0.2

## 2013-11-21 LAB — HEPATIC FUNCTION PANEL
ALBUMIN: 4.1 g/dL (ref 3.5–5.2)
ALK PHOS: 91 U/L (ref 39–117)
ALT: 18 U/L (ref 0–53)
AST: 19 U/L (ref 0–37)
Bilirubin, Direct: 0.2 mg/dL (ref 0.0–0.3)
Indirect Bilirubin: 0.6 mg/dL (ref 0.2–1.2)
TOTAL PROTEIN: 6.9 g/dL (ref 6.0–8.3)
Total Bilirubin: 0.8 mg/dL (ref 0.2–1.2)

## 2013-11-21 LAB — BASIC METABOLIC PANEL
BUN: 18 mg/dL (ref 6–23)
CALCIUM: 9.2 mg/dL (ref 8.4–10.5)
CO2: 34 mEq/L — ABNORMAL HIGH (ref 19–32)
CREATININE: 1.29 mg/dL (ref 0.50–1.35)
Chloride: 99 mEq/L (ref 96–112)
Glucose, Bld: 73 mg/dL (ref 70–99)
Potassium: 4 mEq/L (ref 3.5–5.3)
Sodium: 140 mEq/L (ref 135–145)

## 2013-11-21 LAB — HEMOGLOBIN A1C
HEMOGLOBIN A1C: 5.7 % — AB (ref <5.7)
MEAN PLASMA GLUCOSE: 117 mg/dL — AB (ref <117)

## 2013-11-21 LAB — LIPID PANEL
CHOLESTEROL: 132 mg/dL (ref 0–200)
HDL: 45 mg/dL (ref >39)
LDL Cholesterol: 67 mg/dL (ref 0–99)
Total CHOL/HDL Ratio: 2.9 Ratio
Triglycerides: 102 mg/dL (ref <150)
VLDL: 20 mg/dL (ref 0–40)

## 2013-11-21 MED ORDER — VALSARTAN 160 MG PO TABS: 160.0000 mg | ORAL_TABLET | Freq: Every day | ORAL | Status: DC

## 2013-11-21 NOTE — Patient Instructions (Signed)
Diabetes and Standards of Medical Care Diabetes is complicated. You may find that your diabetes team includes a dietitian, nurse, diabetes educator, eye doctor, and more. To help everyone know what is going on and to help you get the care you deserve, the following schedule of care was developed to help keep you on track. Below are the tests, exams, vaccines, medicines, education, and plans you will need. HbA1c test This test shows how well you have controlled your glucose over the past 2-3 months. It is used to see if your diabetes management plan needs to be adjusted.   It is performed at least 2 times a year if you are meeting treatment goals.  It is performed 4 times a year if therapy has changed or if you are not meeting treatment goals. Blood pressure test  This test is performed at every routine medical visit. The goal is less than 140/90 mm Hg for most people, but 130/80 mm Hg in some cases. Ask your health care provider about your goal. Dental exam  Follow up with the dentist regularly. Eye exam  If you are diagnosed with type 1 diabetes as a child, get an exam upon reaching the age of 37 years or older and have had diabetes for 3-5 years. Yearly eye exams are recommended after that initial eye exam.  If you are diagnosed with type 1 diabetes as an adult, get an exam within 5 years of diagnosis and then yearly.  If you are diagnosed with type 2 diabetes, get an exam as soon as possible after the diagnosis and then yearly. Foot care exam  Visual foot exams are performed at every routine medical visit. The exams check for cuts, injuries, or other problems with the feet.  A comprehensive foot exam should be done yearly. This includes visual inspection as well as assessing foot pulses and testing for loss of sensation.  Check your feet nightly for cuts, injuries, or other problems with your feet. Tell your health care provider if anything is not healing. Kidney function test (urine  microalbumin)  This test is performed once a year.  Type 1 diabetes: The first test is performed 5 years after diagnosis.  Type 2 diabetes: The first test is performed at the time of diagnosis.  A serum creatinine and estimated glomerular filtration rate (eGFR) test is done once a year to assess the level of chronic kidney disease (CKD), if present. Lipid profile (cholesterol, HDL, LDL, triglycerides)  Performed every 5 years for most people.  The goal for LDL is less than 100 mg/dL. If you are at high risk, the goal is less than 70 mg/dL.  The goal for HDL is 40 mg/dL-50 mg/dL for men and 50 mg/dL-60 mg/dL for women. An HDL cholesterol of 60 mg/dL or higher gives some protection against heart disease.  The goal for triglycerides is less than 150 mg/dL. Influenza vaccine, pneumococcal vaccine, and hepatitis B vaccine  The influenza vaccine is recommended yearly.  It is recommended that people with diabetes who are over 24 years old get the pneumonia vaccine. In some cases, two separate shots may be given. Ask your health care provider if your pneumonia vaccination is up to date.  The hepatitis B vaccine is also recommended for adults with diabetes. Diabetes self-management education  Education is recommended at diagnosis and ongoing as needed. Treatment plan  Your treatment plan is reviewed at every medical visit. Document Released: 12/25/2008 Document Revised: 07/14/2013 Document Reviewed: 07/30/2012 [facility] Patient Information 2015 [place],  [employer]. This information is not intended to replace advice given to you by your health care provider. Make sure you discuss any questions you have with your health care provider.  

## 2013-11-21 NOTE — Progress Notes (Signed)
Pre visit review using our clinic review tool, if applicable. No additional management support is needed unless otherwise documented below in the visit note. 

## 2013-11-21 NOTE — Progress Notes (Signed)
   Subjective:    [name], male    DOB: March 21, 1945, 68 y.o.   MRN: [id number]  HPI  HPI HYPERTENSION  Blood pressure range-high see home readings Chest pain- no      Dyspnea- no Lightheadedness- no   Edema- better Other side effects - no   Medication compliance: good Low salt diet- yes  DIABETES  Blood Sugar ranges-good  Polyuria- no New Visual problems- no Hypoglycemic symptoms- no Other side effects-no Medication compliance - good Last eye exam- due Foot exam- today  HYPERLIPIDEMIA  Medication compliance- good RUQ pain- no  Muscle aches- no Other side effects-no       Review of Systems As directed    Objective:   Physical Exam BP 166/93  Pulse 61  Temp(Src) 98.9 F (37.2 C) (Oral)  Wt 312 lb 9.6 oz (141.794 kg)  SpO2 92% General appearance: alert, cooperative, appears stated age and no distress Ears: normal TM's and external ear canals both ears Lungs: clear to auscultation bilaterally Heart: S1, S2 normal Extremities: extremities normal, atraumatic, no cyanosis or edema Sensory exam of the foot is normal, tested with the monofilament. Good pulses, no lesions or ulcers, good peripheral pulses.      Assessment & Plan:  1. Need for prophylactic vaccination and inoculation against influenza  - Flu Vaccine QUAD 36+ mos PF IM (Fluarix Quad PF) - Hepatic function panel - Basic Metabolic Panel (BMET) - Hemoglobin A1c - Lipid panel - Urine Microalbumin w/creat. ratio  2. Essential hypertension D/c cozaar - valsartan (DIOVAN) 160 MG tablet; Take 1 tablet (160 mg total) by mouth daily.  Dispense: 30 tablet; Refill: 2 - Hepatic function panel - Basic Metabolic Panel (BMET) - Hemoglobin A1c - Lipid panel - Urine Microalbumin w/creat. ratio  3. Type II or unspecified type diabetes mellitus with peripheral circulatory disorders, uncontrolled(250.72) Check labs - POCT urinalysis dipstick - Hepatic function panel - Basic Metabolic  Panel (BMET) - Hemoglobin A1c - Lipid panel - Urine Microalbumin w/creat. ratio  4. Other and unspecified hyperlipidemia Check labs - Hepatic function panel - Basic Metabolic Panel (BMET) - Hemoglobin A1c - Lipid panel - Urine Microalbumin w/creat. ratio  5. Onychomycosis  - Hepatic function panel - Basic Metabolic Panel (BMET) - Hemoglobin A1c - Lipid panel - Urine Microalbumin w/creat. ratio

## 2013-11-22 LAB — MICROALBUMIN / CREATININE URINE RATIO
CREATININE, URINE: 57.2 mg/dL
Microalb Creat Ratio: 51.7 mg/g — ABNORMAL HIGH (ref 0.0–30.0)
Microalb, Ur: 2.96 mg/dL — ABNORMAL HIGH (ref 0.00–1.89)

## 2013-11-28 NOTE — Addendum Note (Signed)
Addended by: Modena Morrow D on: 11/28/2013 05:17 PM   Modules accepted: Orders

## 2013-11-29 LAB — PROTEIN, URINE, 24 HOUR
PROTEIN 24H UR: 234 mg/d — AB (ref <150)
Protein, Urine: 13 mg/dL (ref 5–25)

## 2013-11-30 ENCOUNTER — Other Ambulatory Visit: Payer: Self-pay | Admitting: Family Medicine

## 2013-11-30 DIAGNOSIS — R809 Proteinuria, unspecified: Secondary | ICD-10-CM

## 2013-11-30 DIAGNOSIS — E1121 Type 2 diabetes mellitus with diabetic nephropathy: Secondary | ICD-10-CM

## 2013-12-01 ENCOUNTER — Ambulatory Visit: Payer: Medicare Other | Admitting: Pulmonary Disease

## 2013-12-09 ENCOUNTER — Ambulatory Visit (INDEPENDENT_AMBULATORY_CARE_PROVIDER_SITE_OTHER): Payer: Medicare Other | Admitting: Pulmonary Disease

## 2013-12-09 ENCOUNTER — Encounter: Payer: Self-pay | Admitting: Pulmonary Disease

## 2013-12-09 VITALS — BP 138/76 | HR 55 | Temp 98.5°F | Ht 73.0 in | Wt 309.6 lb

## 2013-12-09 DIAGNOSIS — G4733 Obstructive sleep apnea (adult) (pediatric): Secondary | ICD-10-CM

## 2013-12-09 NOTE — Progress Notes (Signed)
   Subjective:    [name], male    DOB: August 21, 1945, 68 y.o.   MRN: [id number]  HPI The patient comes in today for followup of his obstructive sleep apnea. He is wearing CPAP compliantly by his download, but is having some breakthrough events on his fixed pressure of 10 cm. He is also having some issues with nasal congestion, despite making adjustments to his heated humidifier. This is causing him to sleep in a recliner, where he does much better. He is having no issues with his mask fit or pressure.   Review of Systems  Constitutional: Negative for fever and unexpected weight change.  HENT: Negative for congestion, dental problem, ear pain, nosebleeds, postnasal drip, rhinorrhea, sinus pressure, sneezing, sore throat and trouble swallowing.   Eyes: Negative for redness and itching.  Respiratory: Negative for cough, chest tightness, shortness of breath and wheezing.   Cardiovascular: Negative for palpitations and leg swelling.  Gastrointestinal: Negative for nausea and vomiting.  Genitourinary: Negative for dysuria.  Musculoskeletal: Negative for joint swelling.  Skin: Negative for rash.  Neurological: Negative for headaches.  Hematological: Does not bruise/bleed easily.  Psychiatric/Behavioral: Negative for dysphoric mood. The patient is not nervous/anxious.        Objective:   Physical Exam Overweight male in no acute distress Nose without purulence or discharge noted No skin breakdown or pressure necrosis from the CPAP Neck without lymphadenopathy or thyromegaly Lower extremities with edema noted, no cyanosis Alert and oriented, moves all 4 extremities       Assessment & Plan:

## 2013-12-09 NOTE — Assessment & Plan Note (Signed)
The patient is wearing CPAP compliantly, but has been having some breakthrough apneas over the last few weeks. He does not have significant mask leak, and it is unclear why he has such variability in his degree of sleep apnea from night to night. I would like to change his pressure from fixed to an automatic mode, and I've also encouraged him to work aggressively on weight loss. Finally, because he is having nasal congestion at night, will try him on Dymista for a few weeks to see if he sees a difference.

## 2013-12-09 NOTE — Patient Instructions (Signed)
Continue on cpap, and will put your machine on the auto setting so that it can adapt your pressure night to night. Try dymista 2 sprays each nostril at bedtime to see if helps with your nasal congestion.  Work on weight loss followup with me again in one year, but let me know if you are having issues with your cpap.

## 2013-12-17 ENCOUNTER — Telehealth: Payer: Self-pay | Admitting: Family Medicine

## 2013-12-17 NOTE — Telephone Encounter (Signed)
PT WENT TO [facility] AND HE WAS TOLD THAT HE CANNOT GET REFILL FOR THE  TEST STRIPS FOR HIS glucose blood (ONE TOUCH ULTRA TEST) UNTIL 10.23.15 . INSTRUCTIONS FOR THE USE OF THE STRIPS IS TWICE A DAY SO PT RAN OUT.   PT IS REQUESTING FOR REFILLS ON HIS TEST STRIPS FOR ENOUGH TO HAVE 2 A DAY.

## 2013-12-18 MED ORDER — GLUCOSE BLOOD VI STRP: ORAL_STRIP | Status: DC

## 2014-01-12 ENCOUNTER — Telehealth: Payer: Self-pay

## 2014-01-12 MED ORDER — FUROSEMIDE 20 MG PO TABS: ORAL_TABLET | ORAL | Status: DC

## 2014-01-12 NOTE — Telephone Encounter (Signed)
Rx faxed.    KP 

## 2014-01-13 ENCOUNTER — Other Ambulatory Visit: Payer: Self-pay

## 2014-01-13 DIAGNOSIS — I10 Essential (primary) hypertension: Secondary | ICD-10-CM

## 2014-01-13 MED ORDER — DILTIAZEM HCL ER COATED BEADS 300 MG PO CP24: 300.0000 mg | ORAL_CAPSULE | Freq: Every day | ORAL | Status: DC

## 2014-01-18 ENCOUNTER — Emergency Department (INDEPENDENT_AMBULATORY_CARE_PROVIDER_SITE_OTHER)
Admission: EM | Admit: 2014-01-18 | Discharge: 2014-01-18 | Disposition: A | Discharge status: Nursing Home (Includes ICF) | Payer: Medicare Other | Source: Home / Self Care | Attending: Family Medicine | Admitting: Family Medicine

## 2014-01-18 ENCOUNTER — Encounter (HOSPITAL_COMMUNITY): Payer: Self-pay | Admitting: *Deleted

## 2014-01-18 ENCOUNTER — Emergency Department (HOSPITAL_COMMUNITY)
Admission: EM | Admit: 2014-01-18 | Discharge: 2014-01-18 | Disposition: A | Discharge status: Home / Self Care | Payer: Medicare Other | Attending: Emergency Medicine | Admitting: Emergency Medicine

## 2014-01-18 ENCOUNTER — Encounter (HOSPITAL_COMMUNITY): Payer: Self-pay

## 2014-01-18 DIAGNOSIS — E538 Deficiency of other specified B group vitamins: Secondary | ICD-10-CM | POA: Diagnosis not present

## 2014-01-18 DIAGNOSIS — Z79899 Other long term (current) drug therapy: Secondary | ICD-10-CM | POA: Diagnosis not present

## 2014-01-18 DIAGNOSIS — R21 Rash and other nonspecific skin eruption: Secondary | ICD-10-CM | POA: Insufficient documentation

## 2014-01-18 DIAGNOSIS — J449 Chronic obstructive pulmonary disease, unspecified: Secondary | ICD-10-CM | POA: Diagnosis not present

## 2014-01-18 DIAGNOSIS — R600 Localized edema: Secondary | ICD-10-CM

## 2014-01-18 DIAGNOSIS — M7989 Other specified soft tissue disorders: Secondary | ICD-10-CM | POA: Diagnosis present

## 2014-01-18 DIAGNOSIS — L03116 Cellulitis of left lower limb: Secondary | ICD-10-CM | POA: Insufficient documentation

## 2014-01-18 DIAGNOSIS — M1990 Unspecified osteoarthritis, unspecified site: Secondary | ICD-10-CM | POA: Diagnosis not present

## 2014-01-18 DIAGNOSIS — Z7952 Long term (current) use of systemic steroids: Secondary | ICD-10-CM | POA: Insufficient documentation

## 2014-01-18 DIAGNOSIS — M79609 Pain in unspecified limb: Secondary | ICD-10-CM | POA: Diagnosis not present

## 2014-01-18 DIAGNOSIS — I10 Essential (primary) hypertension: Secondary | ICD-10-CM | POA: Diagnosis not present

## 2014-01-18 DIAGNOSIS — Z7982 Long term (current) use of aspirin: Secondary | ICD-10-CM | POA: Insufficient documentation

## 2014-01-18 DIAGNOSIS — M79605 Pain in left leg: Secondary | ICD-10-CM | POA: Diagnosis not present

## 2014-01-18 LAB — POCT I-STAT, CHEM 8
BUN: 18 mg/dL (ref 6–23)
CREATININE: 1.4 mg/dL — AB (ref 0.50–1.35)
Calcium, Ion: 1.12 mmol/L — ABNORMAL LOW (ref 1.13–1.30)
Chloride: 101 mEq/L (ref 96–112)
GLUCOSE: 98 mg/dL (ref 70–99)
HCT: 46 % (ref 39.0–52.0)
HEMOGLOBIN: 15.6 g/dL (ref 13.0–17.0)
Potassium: 3.7 mEq/L (ref 3.7–5.3)
Sodium: 141 mEq/L (ref 137–147)
TCO2: 32 mmol/L (ref 0–100)

## 2014-01-18 LAB — CBC WITH DIFFERENTIAL/PLATELET
BASOS PCT: 1 % (ref 0–1)
Basophils Absolute: 0.1 10*3/uL (ref 0.0–0.1)
EOS ABS: 0.2 10*3/uL (ref 0.0–0.7)
Eosinophils Relative: 1 % (ref 0–5)
HEMATOCRIT: 43.1 % (ref 39.0–52.0)
HEMOGLOBIN: 14.3 g/dL (ref 13.0–17.0)
Lymphocytes Relative: 13 % (ref 12–46)
Lymphs Abs: 1.6 10*3/uL (ref 0.7–4.0)
MCH: 28.4 pg (ref 26.0–34.0)
MCHC: 33.2 g/dL (ref 30.0–36.0)
MCV: 85.7 fL (ref 78.0–100.0)
MONO ABS: 1.3 10*3/uL — AB (ref 0.1–1.0)
Monocytes Relative: 11 % (ref 3–12)
Neutro Abs: 9.3 10*3/uL — ABNORMAL HIGH (ref 1.7–7.7)
Neutrophils Relative %: 74 % (ref 43–77)
Platelets: 243 10*3/uL (ref 150–400)
RBC: 5.03 MIL/uL (ref 4.22–5.81)
RDW: 13.4 % (ref 11.5–15.5)
WBC: 12.4 10*3/uL — ABNORMAL HIGH (ref 4.0–10.5)

## 2014-01-18 LAB — D-DIMER, QUANTITATIVE: D-Dimer, Quant: 1.7 ug/mL-FEU — ABNORMAL HIGH (ref 0.00–0.48)

## 2014-01-18 MED ORDER — CLINDAMYCIN HCL 150 MG PO CAPS: 300.0000 mg | ORAL_CAPSULE | Freq: Four times a day (QID) | ORAL | Status: DC

## 2014-01-18 MED ORDER — CLINDAMYCIN PHOSPHATE 600 MG/50ML IV SOLN
600.0000 mg | Freq: Once | INTRAVENOUS | Status: AC
  Administered 2014-01-18: 600 mg via INTRAVENOUS
  Filled 2014-01-18: qty 50

## 2014-01-18 MED ORDER — CLINDAMYCIN HCL 150 MG PO CAPS: 450.0000 mg | ORAL_CAPSULE | Freq: Three times a day (TID) | ORAL | Status: DC

## 2014-01-18 NOTE — ED Provider Notes (Signed)
CSN: [id number]     Arrival date & time 01/18/14  1254 History   First MD Initiated Contact with Patient 01/18/14 1311     Chief Complaint  Patient presents with  . Leg Swelling   Travis Daniels is a 68 y.o. male history prediabetes, hypertension, COPD who presents from urgent care with left lower leg swelling the past 4 days. He rates his pain at 4 out of 10 and describes it as throbbing. At urgent care he had an elevated d-dimer 1.70. Slightly elevated white blood cell count of 12.4. Patient was sent here for Ultrasound. He denies history of DVT or PE. He denies history of factor V Leiden, protein C or S deficiency. He denies fevers, chills, chest pain, shortness of breath, palpitations, numbness, tingling, weakness. He denies abdominal pain, nausea, vomiting. Denies recent long travel other than a 2 hour trip to the mountains last week. He denies recent Long-haul air travel. He denies recent skin infections or history of MRSA.  (Consider location/radiation/quality/duration/timing/severity/associated sxs/prior Treatment) The history is provided by the patient.    Past Medical History  Diagnosis Date  . Hyperlipidemia   . Hypertension   . 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 B12 deficiency   . Lower extremity edema   . COPD (chronic obstructive pulmonary disease) 01/2011    FVC 47%, FEV1 49%   . Arthritis   . Secondary pulmonary hypertension 01/2011    RV syst pressure 30-40 mm Hg on ECHO    Past Surgical History  Procedure Laterality Date  . Finger surgery Right 11    rt middle finger  . Vasectomy    . Total shoulder arthroplasty Right 01/21/2013    Procedure: SHOULDER HEMI ARTHROPLASTY;  Surgeon: Garald Balding, MD;  Location: [facility];  Service: Orthopedics;  Laterality: Right;  . 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 Ronelle Nigh at [facility] in [place], [place])   . Transthoracic echocardiogram  01/2011    EF 55-60%, mild asymmetric LVH, elevated RV systolic pressure   Family History  Problem Relation Age of Onset  . Cancer Father     colon and pancreas  . Epilepsy Son   . CAD Mother     MI in her 65s   History  Substance Use Topics  . Smoking status: Never Smoker   . Smokeless tobacco: Never Used     Comment: occ alcohol  . Alcohol Use: Yes     Comment: 2-3 drinks occasionally    Review of Systems  Constitutional: Negative for fever, chills and fatigue.  HENT: Negative for congestion, ear pain, rhinorrhea, sneezing and sore throat.   Eyes: Negative for pain and visual disturbance.  Respiratory: Negative for cough, shortness of breath and wheezing.   Cardiovascular: Positive for leg swelling. Negative for chest pain and palpitations.  Gastrointestinal: Negative for nausea, vomiting, abdominal pain and diarrhea.  Genitourinary: Negative for dysuria, frequency, hematuria and difficulty urinating.  Musculoskeletal: Negative for back pain and neck pain.  Skin: Positive for color change and rash. Negative for wound.  Neurological: Negative for dizziness, weakness, light-headedness, numbness and headaches.  All other systems reviewed and are negative.     Allergies  Review of patient's allergies indicates no known allergies.  Home Medications   Prior to Admission medications   Medication Sig Start Date End Date Taking? Authorizing Provider  albuterol (PROVENTIL HFA;VENTOLIN HFA)  108 (90 BASE) MCG/ACT inhaler Inhale 2 puffs into the lungs every 6 (six) hours as needed for wheezing or shortness of breath.    Historical Provider, MD  aspirin 81 MG tablet Take 81 mg by mouth daily.    Historical Provider, MD  betamethasone dipropionate (DIPROLENE) 0.05 % cream Apply topically 2 (two) times daily. 09/18/13   Rosalita Chessman, DO  Cholecalciferol (VITAMIN D-3) 1000 UNITS CAPS Take 1,000 Units by mouth.    Historical Provider, MD  clindamycin  (CLEOCIN) 150 MG capsule Take 3 capsules (450 mg total) by mouth 3 (three) times daily. May dispense as 150mg  capsules 01/18/14   Verda Cumins Abdulhadi Stopa, PA  diltiazem (CARDIZEM CD) 300 MG 24 hr capsule Take 1 capsule (300 mg total) by mouth daily. 01/13/14   Rosalita Chessman, DO  Efinaconazole (JUBLIA) 10 % SOLN Apply qd x 48 weeks 07/17/13   Rosalita Chessman, DO  furosemide (LASIX) 20 MG tablet 1 tablet (20 mg) by mouth daily. 01/12/14   Rosalita Chessman, DO  glucose blood (ONE TOUCH ULTRA TEST) test strip Check blood sugar twice daily 12/18/13   Rosalita Chessman, DO  naproxen sodium (ANAPROX) 220 MG tablet Take 220 mg by mouth 2 (two) times daily with a meal.    Historical Provider, MD  nystatin (MYCOSTATIN) powder Apply topically 4 (four) times daily. 07/17/13   Rosalita Chessman, DO  ONETOUCH DELICA LANCETS FINE MISC Check blood sugar daily 07/03/13   Rosalita Chessman, DO  tolnaftate (TINACTIN) 1 % external solution Apply 1 application topically at bedtime. 07/21/13   Rosalita Chessman, DO  Umeclidinium-Vilanterol 62.5-25 MCG/INH AEPB Inhale 1 puff into the lungs daily. 07/07/13   [name] Lowne, DO  valsartan (DIOVAN) 160 MG tablet Take 1 tablet (160 mg total) by mouth daily. 11/21/13   Rosalita Chessman, DO  vitamin B-12 (CYANOCOBALAMIN) 1000 MCG tablet Take 1,000 mcg by mouth daily.    Historical Provider, MD   BP 157/90 mmHg  Pulse 72  Temp(Src) 98.1 F (36.7 C) (Oral)  Resp 16  SpO2 96% Physical Exam  Constitutional: He appears well-developed and well-nourished. No distress.  HENT:  Head: Normocephalic and atraumatic.  Mouth/Throat: Oropharynx is clear and moist. No oropharyngeal exudate.  Eyes: Conjunctivae are normal. Pupils are equal, round, and reactive to light. Right eye exhibits no discharge. Left eye exhibits no discharge.  Neck: Neck supple.  Cardiovascular: Normal rate, regular rhythm, normal heart sounds and intact distal pulses.  Exam reveals no gallop and no friction rub.   No murmur  heard. Bilateral posterior tibialis and dorsalis pedis pulses 2+  Pulmonary/Chest: Effort normal and breath sounds normal. No respiratory distress. He has no wheezes. He has no rales.  Abdominal: Soft. There is no tenderness.  Musculoskeletal: Normal range of motion. He exhibits edema.  Full ROM of his bilateral lower extremities without pain. Edema and erythema to his left lower leg from his mid shin to his ankle.  Lymphadenopathy:    He has no cervical adenopathy.  Neurological: He is alert. Coordination normal.  Skin: Skin is warm and dry. Rash noted. He is not diaphoretic. There is erythema. No pallor.  Erythema and edema consistent with cellulitis to his left leg from his mid shin to his ankle. No discharge or weeping noted. Left leg is warm to touch.   Psychiatric: He has a normal mood and affect. His behavior is normal.  Nursing note and vitals reviewed.   ED Course  Procedures (  including critical care time) Labs Review Labs Reviewed - No data to display  Imaging Review No results found.   EKG Interpretation None      Filed Vitals:   01/18/14 1315 01/18/14 1330 01/18/14 1345 01/18/14 1540  BP: 146/80 138/69 149/73 157/90  Pulse: 66 70 67 72  Temp:      TempSrc:      Resp: 22 20 22 16   SpO2: 95% 97% 94% 96%     MDM   Meds given in ED:  Medications  clindamycin (CLEOCIN) IVPB 600 mg (600 mg Intravenous New Bag/Given 01/18/14 1538)    New Prescriptions   CLINDAMYCIN (CLEOCIN) 150 MG CAPSULE    Take 3 capsules (450 mg total) by mouth 3 (three) times daily. May dispense as 150mg  capsules    Final diagnoses:  Cellulitis of left lower extremity   Travis Daniels presents to the ED from urgent care complaining of swelling and redness to his left lower leg for the past 4 days. He was sent from urgent care for Doppler ultrasound due to his elevated d-dimer. The patient's ultrasound was negative for DVT. The patient's first dose of clindamycin is given in the ED. The  patient's area of cellulitis was marked with a skin marker. Will discharge patient with clindamycin 450 mg 3 times a day. Patient states he has very mild pain does not desire any pain medication at this time.  Advised the patient he must return to his PCP or the emergency department within 24 hours for recheck. Advised to return sooner if he develops fevers, worsening pain, red streaking or the area of cellulitis is spreading. Advised the patient to return to the ED with new or worsening symptoms or new concerns. Patient verbalized understanding and agreement with plan. The patient was discussed with and evaluated by Dr. Ashok Cordia who agrees with assessment and plan.     Hanley Hays, PA 01/18/14 [place], MD 01/23/14 [phone]

## 2014-01-18 NOTE — Progress Notes (Signed)
VASCULAR LAB PRELIMINARY  PRELIMINARY  PRELIMINARY  PRELIMINARY  Left lower extremity venous Doppler completed.    Preliminary report:  There is no DVT or SVT noted in the left lower extremity.   Jesika Men, RVT 01/18/2014, 4:25 PM

## 2014-01-18 NOTE — Discharge Instructions (Signed)
Please follow-up with your primary care provider within 24 hours for recheck. If you develop a fever, or if the pain worsens or if the infection is spreading please return to the Emergency Department immediately.   Cellulitis Cellulitis is an infection of the skin and the tissue beneath it. The infected area is usually red and tender. Cellulitis occurs most often in the arms and lower legs.  CAUSES  Cellulitis is caused by bacteria that enter the skin through cracks or cuts in the skin. The most common types of bacteria that cause cellulitis are staphylococci and streptococci. SIGNS AND SYMPTOMS   Redness and warmth.  Swelling.  Tenderness or pain.  Fever. DIAGNOSIS  Your health care provider can usually determine what is wrong based on a physical exam. Blood tests may also be done. TREATMENT  Treatment usually involves taking an antibiotic medicine. HOME CARE INSTRUCTIONS   Take your antibiotic medicine as directed by your health care provider. Finish the antibiotic even if you start to feel better.  Keep the infected arm or leg elevated to reduce swelling.  Apply a warm cloth to the affected area up to 4 times per day to relieve pain.  Take medicines only as directed by your health care provider.  Keep all follow-up visits as directed by your health care provider. SEEK MEDICAL CARE IF:   You notice red streaks coming from the infected area.  Your red area gets larger or turns dark in color.  Your bone or joint underneath the infected area becomes painful after the skin has healed.  Your infection returns in the same area or another area.  You notice a swollen bump in the infected area.  You develop new symptoms.  You have a fever. SEEK IMMEDIATE MEDICAL CARE IF:   You feel very sleepy.  You develop vomiting or diarrhea.  You have a general ill feeling (malaise) with muscle aches and pains. MAKE SURE YOU:   Understand these instructions.  Will watch your  condition.  Will get help right away if you are not doing well or get worse. Document Released: 12/07/2004 Document Revised: 07/14/2013 Document Reviewed: 05/15/2011 [facility] Patient Information 2015 [place], [place]. This information is not intended to replace advice given to you by your health care provider. Make sure you discuss any questions you have with your health care provider.

## 2014-01-18 NOTE — ED Notes (Signed)
Pt reports moderate swelling and redness to left lower leg x 4 days. Sent here from [facility] for further eval, leg is warm to touch.

## 2014-01-18 NOTE — ED Notes (Signed)
Patient complains of swelling to his left lower leg that started about four Days ago.  Denies any injury.  Leg is red and hot to the touch,

## 2014-01-18 NOTE — ED Provider Notes (Signed)
CSN: [id number]     Arrival date & time 01/18/14  1105 History   First MD Initiated Contact with Patient 01/18/14 1122     Chief Complaint  Patient presents with  . Leg Swelling    left lower leg   (Consider location/radiation/quality/duration/timing/severity/associated sxs/prior Treatment) HPI       68 year old male with history of hypertension, prediabetes, venous stasis dermatitis, COPD, with a history of cellulitis years ago, presents complaining of left lower extremity redness and swelling.this started about 4 days ago and has gotten progressively worse. Leg is uncomfortable, not severely painful. He denies any systemic symptoms including no fever, chills, chest pain, shortness of breath, or pleuritic chest pain. He denies any recent travel apart from a 2 Hour Dr. One week ago. No recent airplane travel. No history of DVT or PE. His medications included daily aspirin. No numbness. No difficulty with ambulation.   Past Medical History  Diagnosis Date  . Hyperlipidemia   . Hypertension   . 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 B12 deficiency   . Lower extremity edema   . COPD (chronic obstructive pulmonary disease) 01/2011    FVC 47%, FEV1 49%   . Arthritis   . Secondary pulmonary hypertension 01/2011    RV syst pressure 30-40 mm Hg on ECHO    Past Surgical History  Procedure Laterality Date  . Finger surgery Right 11    rt middle finger  . Vasectomy    . Total shoulder arthroplasty Right 01/21/2013    Procedure: SHOULDER HEMI ARTHROPLASTY;  Surgeon: Garald Balding, MD;  Location: [place];  Service: Orthopedics;  Laterality: Right;  . 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 Ronelle Nigh at [facility] in [place], [place])  . Transthoracic echocardiogram  01/2011    EF 55-60%, mild asymmetric LVH, elevated RV systolic pressure   Family History  Problem Relation Age of  Onset  . Cancer Father     colon and pancreas  . Epilepsy Son   . CAD Mother     MI in her 28s   History  Substance Use Topics  . Smoking status: Never Smoker   . Smokeless tobacco: Never Used     Comment: occ alcohol  . Alcohol Use: Yes     Comment: 2-3 drinks occasionally    Review of Systems  Constitutional: Negative for fever and chills.  Respiratory: Negative for shortness of breath.   Cardiovascular: Positive for leg swelling. Negative for chest pain.  Skin: Positive for color change.  All other systems reviewed and are negative.   Allergies  Review of patient's allergies indicates no known allergies.  Home Medications   Prior to Admission medications   Medication Sig Start Date End Date Taking? Authorizing Provider  albuterol (PROVENTIL HFA;VENTOLIN HFA) 108 (90 BASE) MCG/ACT inhaler Inhale 2 puffs into the lungs every 6 (six) hours as needed for wheezing or shortness of breath.    Historical Provider, MD  aspirin 81 MG tablet Take 81 mg by mouth daily.    Historical Provider, MD  betamethasone dipropionate (DIPROLENE) 0.05 % cream Apply topically 2 (two) times daily. 09/18/13   Rosalita Chessman, DO  Cholecalciferol (VITAMIN D-3) 1000 UNITS CAPS Take 1,000 Units by mouth.    Historical Provider, MD  diltiazem (CARDIZEM CD) 300 MG 24 hr capsule Take 1 capsule (300 mg total) by mouth daily. 01/13/14  Rosalita Chessman, DO  Efinaconazole (JUBLIA) 10 % SOLN Apply qd x 48 weeks 07/17/13   Rosalita Chessman, DO  furosemide (LASIX) 20 MG tablet 1 tablet (20 mg) by mouth daily. 01/12/14   Rosalita Chessman, DO  glucose blood (ONE TOUCH ULTRA TEST) test strip Check blood sugar twice daily 12/18/13   Rosalita Chessman, DO  naproxen sodium (ANAPROX) 220 MG tablet Take 220 mg by mouth 2 (two) times daily with a meal.    Historical Provider, MD  nystatin (MYCOSTATIN) powder Apply topically 4 (four) times daily. 07/17/13   Rosalita Chessman, DO  ONETOUCH DELICA LANCETS FINE MISC Check blood sugar daily  07/03/13   Rosalita Chessman, DO  tolnaftate (TINACTIN) 1 % external solution Apply 1 application topically at bedtime. 07/21/13   Rosalita Chessman, DO  Umeclidinium-Vilanterol 62.5-25 MCG/INH AEPB Inhale 1 puff into the lungs daily. 07/07/13   [name] Lowne, DO  valsartan (DIOVAN) 160 MG tablet Take 1 tablet (160 mg total) by mouth daily. 11/21/13   Rosalita Chessman, DO  vitamin B-12 (CYANOCOBALAMIN) 1000 MCG tablet Take 1,000 mcg by mouth daily.    Historical Provider, MD   BP 168/93 mmHg  Pulse 66  Temp(Src) 98.2 F (36.8 C) (Oral)  Resp 20  SpO2 95% Physical Exam  Constitutional: He is oriented to person, place, and time. He appears well-developed and well-nourished. No distress.  HENT:  Head: Normocephalic.  Cardiovascular:  Pulses:      Dorsalis pedis pulses are 2+ on the left side.  Pulmonary/Chest: Effort normal. No respiratory distress.  Musculoskeletal:  The left lower extremity is erythematous, with 3+ pitting edema up to the area 5 cm below the knee. There is minimal tenderness in the back of the leg or calf. Homans sign is negative. Sensation is intact. Capillary refill is less than 3 seconds. Dorsalis pedis pulses 2+.  Neurological: He is alert and oriented to person, place, and time. Coordination normal.  Skin: Skin is warm and dry. No rash noted. He is not diaphoretic.  Psychiatric: He has a normal mood and affect. Judgment normal.  Nursing note and vitals reviewed.   ED Course  Procedures (including critical care time) Labs Review Labs Reviewed  CBC WITH DIFFERENTIAL - Abnormal; Notable for the following:    WBC 12.4 (*)    Neutro Abs 9.3 (*)    Monocytes Absolute 1.3 (*)    All other components within normal limits  D-DIMER, QUANTITATIVE - Abnormal; Notable for the following:    D-Dimer, Quant 1.70 (*)    All other components within normal limits  POCT I-STAT, CHEM 8 - Abnormal; Notable for the following:    Creatinine, Ser 1.40 (*)    Calcium, Ion 1.12 (*)    All  other components within normal limits    Imaging Review No results found.   MDM   1. Edema of left lower extremity   2. Lower extremity pain, left    Leukocytosis, elevated d-dimer. Suspect cellulitis but Needs to have ultrasound to rule out DVT. Transferred to the emergency department via shuttle.      Liam Graham, PA-C 01/18/14 [place] Karrina Lye, PA-C 01/18/14 1242

## 2014-01-19 DIAGNOSIS — Z96611 Presence of right artificial shoulder joint: Secondary | ICD-10-CM | POA: Diagnosis not present

## 2014-01-19 DIAGNOSIS — M19011 Primary osteoarthritis, right shoulder: Secondary | ICD-10-CM | POA: Diagnosis not present

## 2014-01-20 ENCOUNTER — Encounter: Payer: Self-pay | Admitting: Family Medicine

## 2014-01-20 ENCOUNTER — Ambulatory Visit (INDEPENDENT_AMBULATORY_CARE_PROVIDER_SITE_OTHER): Payer: Medicare Other | Admitting: Family Medicine

## 2014-01-20 VITALS — BP 140/86 | HR 91 | Temp 99.2°F | Wt 310.0 lb

## 2014-01-20 DIAGNOSIS — L03116 Cellulitis of left lower limb: Secondary | ICD-10-CM | POA: Diagnosis not present

## 2014-01-20 NOTE — Progress Notes (Signed)
   Subjective:    [name], male    DOB: June 28, 1945, 68 y.o.   MRN: [id number]  HPI Pt here to f/u from ER-- cellulitis L leg leg.  Pt was seen Sunday---- dvt ruled out.  Pt put on antibiotics.  Redness is much better per pt.  See ER note, labs, imaging, etc.   Review of Systems As above    Objective:   Physical Exam  BP 140/86 mmHg  Pulse 91  Temp(Src) 99.2 F (37.3 C) (Oral)  Wt 310 lb (140.615 kg)  SpO2 95% General appearance: alert, cooperative, appears stated age and no distress Lungs: clear to auscultation bilaterally Heart: S1, S2 normal Extremities: + errythema and swelling L low leg, no calf pain      Assessment & Plan:  1. Cellulitis of left lower leg Finish clindamycin rto 1 week or sooner prn

## 2014-01-20 NOTE — Patient Instructions (Signed)

## 2014-01-20 NOTE — Progress Notes (Signed)
Pre visit review using our clinic review tool, if applicable. No additional management support is needed unless otherwise documented below in the visit note. 

## 2014-01-27 ENCOUNTER — Encounter: Payer: Self-pay | Admitting: Family Medicine

## 2014-01-27 ENCOUNTER — Ambulatory Visit (INDEPENDENT_AMBULATORY_CARE_PROVIDER_SITE_OTHER): Payer: Medicare Other | Admitting: Family Medicine

## 2014-01-27 VITALS — BP 136/82 | HR 72 | Temp 98.4°F | Wt 305.6 lb

## 2014-01-27 DIAGNOSIS — L02419 Cutaneous abscess of limb, unspecified: Secondary | ICD-10-CM

## 2014-01-27 DIAGNOSIS — L03119 Cellulitis of unspecified part of limb: Secondary | ICD-10-CM | POA: Diagnosis not present

## 2014-01-27 DIAGNOSIS — R609 Edema, unspecified: Secondary | ICD-10-CM

## 2014-01-27 NOTE — Patient Instructions (Signed)
fiinish abx Increase the lasix to 2 tab (40 mg) daily for 3-4 days and then decrease back down to 1 a day

## 2014-01-27 NOTE — Progress Notes (Signed)
   Subjective:    [name], male    DOB: 1945/11/09, 68 y.o.   MRN: [id number]  HPI Pt here f/u cellulitis and c/o edema L low ext.  No sob, cp etc. Pt has 3 doses of abx left.  Review of Systems As above    Objective:   Physical Exam BP 136/82 mmHg  Pulse 72  Temp(Src) 98.4 F (36.9 C) (Oral)  Wt 305 lb 9.6 oz (138.619 kg)  SpO2 95% General appearance: alert, cooperative, appears stated age and no distress Lungs: clear to auscultation bilaterally Extremities: edema +1 pitting edema L Low ext,  cellulitis resolved       Assessment & Plan:  1. Edema [employer] lasix 40 mg daily for 3-4 days then dec back down to one rto 3 months or sooner prn   2. Cellulitis and abscess of lower extremity Resolved--finished abx

## 2014-01-27 NOTE — Progress Notes (Signed)
Pre visit review using our clinic review tool, if applicable. No additional management support is needed unless otherwise documented below in the visit note. 

## 2014-02-18 ENCOUNTER — Other Ambulatory Visit: Payer: Self-pay | Admitting: Nephrology

## 2014-02-18 DIAGNOSIS — R3129 Other microscopic hematuria: Secondary | ICD-10-CM

## 2014-02-18 DIAGNOSIS — I10 Essential (primary) hypertension: Secondary | ICD-10-CM | POA: Diagnosis not present

## 2014-02-18 DIAGNOSIS — R312 Other microscopic hematuria: Secondary | ICD-10-CM | POA: Diagnosis not present

## 2014-02-18 DIAGNOSIS — N183 Chronic kidney disease, stage 3 unspecified: Secondary | ICD-10-CM

## 2014-02-18 DIAGNOSIS — R809 Proteinuria, unspecified: Secondary | ICD-10-CM | POA: Diagnosis not present

## 2014-02-20 ENCOUNTER — Other Ambulatory Visit: Payer: Self-pay | Admitting: Family Medicine

## 2014-02-20 ENCOUNTER — Ambulatory Visit
Admission: RE | Admit: 2014-02-20 | Discharge: 2014-02-20 | Disposition: A | Discharge status: Home / Self Care | Payer: Medicare Other | Source: Ambulatory Visit | Attending: Nephrology | Admitting: Nephrology

## 2014-02-20 DIAGNOSIS — N183 Chronic kidney disease, stage 3 unspecified: Secondary | ICD-10-CM

## 2014-02-20 DIAGNOSIS — R312 Other microscopic hematuria: Secondary | ICD-10-CM | POA: Diagnosis not present

## 2014-02-20 DIAGNOSIS — R3129 Other microscopic hematuria: Secondary | ICD-10-CM

## 2014-02-23 ENCOUNTER — Ambulatory Visit: Payer: Medicare Other | Admitting: Family Medicine

## 2014-03-03 DIAGNOSIS — I10 Essential (primary) hypertension: Secondary | ICD-10-CM | POA: Diagnosis not present

## 2014-03-23 ENCOUNTER — Ambulatory Visit (INDEPENDENT_AMBULATORY_CARE_PROVIDER_SITE_OTHER): Payer: Medicare Other | Admitting: Medical

## 2014-03-23 ENCOUNTER — Encounter: Payer: Self-pay | Admitting: Medical

## 2014-03-23 VITALS — BP 153/82 | HR 72 | Temp 98.5°F | Ht 73.0 in | Wt 302.6 lb

## 2014-03-23 DIAGNOSIS — M25579 Pain in unspecified ankle and joints of unspecified foot: Secondary | ICD-10-CM | POA: Insufficient documentation

## 2014-03-23 DIAGNOSIS — M25571 Pain in right ankle and joints of right foot: Secondary | ICD-10-CM

## 2014-03-23 MED ORDER — DOXYCYCLINE HYCLATE 100 MG PO TABS: 100.0000 mg | ORAL_TABLET | Freq: Two times a day (BID) | ORAL | Status: DC

## 2014-03-23 MED ORDER — DICLOFENAC SODIUM 75 MG PO TBEC: 75.0000 mg | DELAYED_RELEASE_TABLET | Freq: Two times a day (BID) | ORAL | Status: DC

## 2014-03-23 NOTE — Assessment & Plan Note (Signed)
For your foot pain, I think you may have some plantar fascia pain but the degree of swelling on top of distal aspet of the foot causes concern for early cellulitis.   I want to take low dose ibuprofen 200 mg  for 2 days   and start doxycycline antibiotic. Follow up on wed/2 days afternoon. If  you have no improvement then would get cbc and possible other studies such as uric acid or xray.  In the event your were to experience any pain behind then knee then ED evaluation  Note lab was closed at time I saw pt so getting cbc or uric acid not possible.

## 2014-03-23 NOTE — Progress Notes (Signed)
Pre visit review using our clinic review tool, if applicable. No additional management support is needed unless otherwise documented below in the visit note. 

## 2014-03-23 NOTE — Progress Notes (Signed)
Subjective:    [name], male    DOB: 02/20/1946, 69 y.o.   MRN: [id number]  HPI   Pt in with some pain at bottom aspect foot. For 3-4 days. No trauma and no fall. Before thanks giving he had cellulitis of left calf. Also another event years ago on the rt side. No fever, no chills, and no sweats. No rt popliteal pain.No acute abnormal sob.     Past Medical History  Diagnosis Date  . Hyperlipidemia   . Hypertension   . 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 B12 deficiency   . Lower extremity edema   . COPD (chronic obstructive pulmonary disease) 01/2011    FVC 47%, FEV1 49%   . Arthritis   . Secondary pulmonary hypertension 01/2011    RV syst pressure 30-40 mm Hg on ECHO     History   Social History  . Marital Status: Married    Spouse Name: N/A    Number of Children: 4  . Years of Education: N/A   Occupational History  . retired    Social History Main Topics  . Smoking status: Never Smoker   . Smokeless tobacco: Never Used     Comment: occ alcohol  . Alcohol Use: Yes     Comment: 2-3 drinks occasionally  . Drug Use: No  . Sexual Activity: Yes   Other Topics Concern  . Not on file   Social History Narrative   Travis Daniels lives with his wife in [place]. He is recently retired and relocated to [facility] from [place]. Historically, his medical provider has been [facility] in [place].    Past Surgical History  Procedure Laterality Date  . Finger surgery Right 11    rt middle finger  . Vasectomy    . Total shoulder arthroplasty Right 01/21/2013    Procedure: SHOULDER HEMI ARTHROPLASTY;  Surgeon: Garald Balding, MD;  Location: [place];  Service: Orthopedics;  Laterality: Right;  . 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 Ronelle Nigh at [facility] in [place], [place])  . Transthoracic echocardiogram  01/2011    EF 55-60%, mild asymmetric LVH, elevated  RV systolic pressure    Family History  Problem Relation Age of Onset  . Cancer Father     colon and pancreas  . Epilepsy Son   . CAD Mother     MI in her 21s    No Known Allergies  Current Outpatient Prescriptions on File Prior to Visit  Medication Sig Dispense Refill  . albuterol (PROVENTIL HFA;VENTOLIN HFA) 108 (90 BASE) MCG/ACT inhaler Inhale 2 puffs into the lungs every 6 (six) hours as needed for wheezing or shortness of breath.    Marland Kitchen aspirin 81 MG tablet Take 81 mg by mouth daily.    . betamethasone dipropionate (DIPROLENE) 0.05 % cream Apply topically 2 (two) times daily. 30 g 0  . Cholecalciferol (VITAMIN D-3) 1000 UNITS CAPS Take 1,000 Units by mouth.    . clindamycin (CLEOCIN) 150 MG capsule Take 3 capsules (450 mg total) by mouth 3 (three) times daily. May dispense as 150mg  capsules 90 capsule 0  . diltiazem (CARDIZEM CD) 300 MG 24 hr capsule Take 1 capsule (300 mg total) by mouth daily. 30 capsule 5  . Efinaconazole (JUBLIA) 10 % SOLN Apply qd x 48 weeks 4 mL 11  . furosemide (LASIX)  20 MG tablet 1 tablet (20 mg) by mouth daily. 30 tablet 5  . glucose blood (ONE TOUCH ULTRA TEST) test strip Check blood sugar twice daily 100 each 12  . naproxen sodium (ANAPROX) 220 MG tablet Take 220 mg by mouth 2 (two) times daily with a meal.    . nystatin (MYCOSTATIN) powder Apply topically 4 (four) times daily. 15 g 0  . ONETOUCH DELICA LANCETS FINE MISC Check blood sugar daily 100 each 12  . tolnaftate (TINACTIN) 1 % external solution Apply 1 application topically at bedtime. 10 mL 0  . Umeclidinium-Vilanterol 62.5-25 MCG/INH AEPB Inhale 1 puff into the lungs daily. 1 each 11  . valsartan (DIOVAN) 160 MG tablet TAKE 1 TABLET (160 MG TOTAL) BY MOUTH DAILY. 30 tablet 5  . vitamin B-12 (CYANOCOBALAMIN) 1000 MCG tablet Take 1,000 mcg by mouth daily.     No current facility-administered medications on file prior to visit.    BP 153/82 mmHg  Pulse 72  Temp(Src) 98.5 F (36.9 C) (Oral)   Ht 6\' 1"  (1.854 m)  Wt 302 lb 9.6 oz (137.258 kg)  BMI 39.93 kg/m2  SpO2 96%      Review of Systems  Constitutional: Negative for fever, chills and fatigue.  Respiratory: Negative for cough, chest tightness, shortness of breath and wheezing.   Cardiovascular: Negative for chest pain and palpitations.  Gastrointestinal: Negative for abdominal pain.  Musculoskeletal: Negative for back pain.       Rt foot pain bottom aspect and on top of foot over distal foot.  Neurological: Negative for dizziness, syncope, facial asymmetry, speech difficulty, numbness and headaches.  Hematological: Negative for adenopathy. Does not bruise/bleed easily.       Objective:   Physical Exam   General- No acute distress. Pleasant patient. Neck- Full range of motion, no jvd Lungs- Clear, even and unlabored. Heart- regular rate and rhythm. Rt lower ext- negative homans sign. Calve symmetric to left side. The right foot is swollen compared to lt side. No Tender to palpation at the arch. And tender over distal aspect of the foot.          Assessment & Plan:  Our lab is closed at this hour. Pt lives 20 minutes away.

## 2014-03-23 NOTE — Patient Instructions (Addendum)
For your foot pain, I think you may have some plantar fascia pain but the degree of swelling on top of distal aspet of the foot causes concern for early cellulitis.   I want to take low dose ibuprofen 200 mg  for 2 days   and start doxycycline antibiotic. Follow up on wed/2 days afternoon. If  you have no improvement then would get cbc and possible other studies such as uric acid or xray.  In the event your were to experience any pain behind then knee then ED evaluation

## 2014-03-25 ENCOUNTER — Ambulatory Visit (INDEPENDENT_AMBULATORY_CARE_PROVIDER_SITE_OTHER): Payer: Medicare Other | Admitting: Medical

## 2014-03-25 ENCOUNTER — Ambulatory Visit (HOSPITAL_BASED_OUTPATIENT_CLINIC_OR_DEPARTMENT_OTHER)
Admission: RE | Admit: 2014-03-25 | Discharge: 2014-03-25 | Disposition: A | Discharge status: Home / Self Care | Payer: Medicare Other | Source: Ambulatory Visit | Attending: Medical | Admitting: Medical

## 2014-03-25 ENCOUNTER — Encounter: Payer: Self-pay | Admitting: Medical

## 2014-03-25 VITALS — BP 145/80 | HR 74 | Temp 98.3°F | Ht 73.0 in | Wt 301.2 lb

## 2014-03-25 DIAGNOSIS — M79671 Pain in right foot: Secondary | ICD-10-CM | POA: Insufficient documentation

## 2014-03-25 DIAGNOSIS — M25571 Pain in right ankle and joints of right foot: Secondary | ICD-10-CM

## 2014-03-25 DIAGNOSIS — M1990 Unspecified osteoarthritis, unspecified site: Secondary | ICD-10-CM | POA: Diagnosis not present

## 2014-03-25 DIAGNOSIS — M7731 Calcaneal spur, right foot: Secondary | ICD-10-CM | POA: Insufficient documentation

## 2014-03-25 DIAGNOSIS — M7661 Achilles tendinitis, right leg: Secondary | ICD-10-CM | POA: Diagnosis not present

## 2014-03-25 NOTE — Patient Instructions (Addendum)
Your distal foot and arch pain is a lot better. I think you had likely early cellulitis though may have been gout.  Continue both ibuprofen low dose and doxycycline.  Unfortunately, I think you know have some achilles tendonitis related to gait change from the above.  I do want you to get rt foot xray today. I will go ahead and refer you to sports medicine. I will try to get you in on Friday or this coming Monday  Follow up here prn.

## 2014-03-25 NOTE — Assessment & Plan Note (Signed)
Your distal foot and arch pain is a lot better. I think you had likely early cellulitis though may have been gout.  Continue both ibuprofen low dose and doxycycline.

## 2014-03-25 NOTE — Progress Notes (Signed)
Pre visit review using our clinic review tool, if applicable. No additional management support is needed unless otherwise documented below in the visit note. 

## 2014-03-25 NOTE — Assessment & Plan Note (Signed)
Unfortunately, I think you know have some achilles tendonitis related to gait change.  I do want you to get rt foot xray today. I will go ahead and refer you to sports medicine. I will try to get you in on Friday or this coming Monday

## 2014-04-01 ENCOUNTER — Ambulatory Visit (INDEPENDENT_AMBULATORY_CARE_PROVIDER_SITE_OTHER): Payer: Medicare Other | Admitting: Family Medicine

## 2014-04-01 ENCOUNTER — Encounter: Payer: Self-pay | Admitting: Family Medicine

## 2014-04-01 ENCOUNTER — Telehealth: Payer: Self-pay | Admitting: Family Medicine

## 2014-04-01 VITALS — BP 191/105 | HR 55 | Ht 73.0 in | Wt 301.0 lb

## 2014-04-01 DIAGNOSIS — M25571 Pain in right ankle and joints of right foot: Secondary | ICD-10-CM | POA: Diagnosis not present

## 2014-04-01 NOTE — Telephone Encounter (Signed)
Pt sees pulmonary 

## 2014-04-01 NOTE — Telephone Encounter (Signed)
We need his formulary---but all inhalers are expensive--- maybe he qualifies to get it from company?

## 2014-04-01 NOTE — Patient Instructions (Signed)
You have Achilles Tendinopathy but your symptoms have resolved at this point. Do the stretches - hold for 20-30 seconds, repeat 3 times for at least the next week. Consider the calf raise exercise - 3 sets of 10 once a day with both feet - would advance to one foot then finally on a step. I would start the calf raise exercise if you start to feel this come back. Ice bucket 10-15 minutes at end of day - can ice 3-4 times a day. Avoid uneven ground, hills as much as possible. Heel lifts for the next week at least. If not improving would consider physical therapy, nitro patches, custom orthotics. Follow up with me as needed.

## 2014-04-01 NOTE — Telephone Encounter (Signed)
ANORO ELIPTA   HIS COPAY IS $322.25 FOR 1 MONTH.  hE NEEDS A DIFFERENT MED

## 2014-04-01 NOTE — Telephone Encounter (Signed)
To MD to review and advise      KP 

## 2014-04-01 NOTE — Telephone Encounter (Signed)
He only see pulm for sleep apnea, you are the prescriber of his inhalers. Please advise     KP

## 2014-04-02 ENCOUNTER — Telehealth: Payer: Self-pay

## 2014-04-02 NOTE — Progress Notes (Signed)
PCP: Garnet Koyanagi, DO Referred by Mackie Pai PA  Subjective:   HPI: Patient is a 69 y.o. male here for right heel pain.  Patient reports about 1 1/2 weeks ago he started getting pain on top of his right foot that then went to plantar foot and finally posterior heel. States his pain has since mostly resolved though he has a little pain now at base of great toe when walking. Doesn't recall an acute injury or increase in activity level prior to this pain. He is finishing antibiotics for possible early cellulitis of this leg.  Past Medical History  Diagnosis Date  . Hyperlipidemia   . Hypertension   . 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 B12 deficiency   . Lower extremity edema   . COPD (chronic obstructive pulmonary disease) 01/2011    FVC 47%, FEV1 49%   . Arthritis   . Secondary pulmonary hypertension 01/2011    RV syst pressure 30-40 mm Hg on ECHO     Current Outpatient Prescriptions on File Prior to Visit  Medication Sig Dispense Refill  . albuterol (PROVENTIL HFA;VENTOLIN HFA) 108 (90 BASE) MCG/ACT inhaler Inhale 2 puffs into the lungs every 6 (six) hours as needed for wheezing or shortness of breath.    Marland Kitchen aspirin 81 MG tablet Take 81 mg by mouth daily.    . betamethasone dipropionate (DIPROLENE) 0.05 % cream Apply topically 2 (two) times daily. 30 g 0  . Cholecalciferol (VITAMIN D-3) 1000 UNITS CAPS Take 1,000 Units by mouth.    . clindamycin (CLEOCIN) 150 MG capsule Take 3 capsules (450 mg total) by mouth 3 (three) times daily. May dispense as 150mg  capsules 90 capsule 0  . diltiazem (CARDIZEM CD) 300 MG 24 hr capsule Take 1 capsule (300 mg total) by mouth daily. 30 capsule 5  . doxycycline (VIBRA-TABS) 100 MG tablet Take 1 tablet (100 mg total) by mouth 2 (two) times daily. 20 tablet 0  . Efinaconazole (JUBLIA) 10 % SOLN Apply qd x 48 weeks 4 mL 11  . furosemide (LASIX) 20 MG tablet 1 tablet (20 mg) by mouth daily. 30 tablet 5  . glucose blood (ONE  TOUCH ULTRA TEST) test strip Check blood sugar twice daily 100 each 12  . naproxen sodium (ANAPROX) 220 MG tablet Take 220 mg by mouth 2 (two) times daily with a meal.    . nystatin (MYCOSTATIN) powder Apply topically 4 (four) times daily. 15 g 0  . ONETOUCH DELICA LANCETS FINE MISC Check blood sugar daily 100 each 12  . tolnaftate (TINACTIN) 1 % external solution Apply 1 application topically at bedtime. 10 mL 0  . Umeclidinium-Vilanterol 62.5-25 MCG/INH AEPB Inhale 1 puff into the lungs daily. 1 each 11  . valsartan (DIOVAN) 160 MG tablet TAKE 1 TABLET (160 MG TOTAL) BY MOUTH DAILY. 30 tablet 5  . vitamin B-12 (CYANOCOBALAMIN) 1000 MCG tablet Take 1,000 mcg by mouth daily.     No current facility-administered medications on file prior to visit.    Past Surgical History  Procedure Laterality Date  . Finger surgery Right 11    rt middle finger  . Vasectomy    . Total shoulder arthroplasty Right 01/21/2013    Procedure: SHOULDER HEMI ARTHROPLASTY;  Surgeon: Garald Balding, MD;  Location: [place];  Service: Orthopedics;  Laterality: Right;  . 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 Ronelle Nigh  at [facility] in [place], [place])  . Transthoracic echocardiogram  01/2011    EF 55-60%, mild asymmetric LVH, elevated RV systolic pressure    No Known Allergies  History   Social History  . Marital Status: Married    Spouse Name: N/A    Number of Children: 4  . Years of Education: N/A   Occupational History  . retired    Social History Main Topics  . Smoking status: Never Smoker   . Smokeless tobacco: Never Used     Comment: occ alcohol  . Alcohol Use: 0.0 oz/week    0 Not specified per week     Comment: 2-3 drinks occasionally  . Drug Use: No  . Sexual Activity: Yes   Other Topics Concern  . Not on file   Social History Narrative   Mr. Pettigrew lives with his wife in [place]. He is  recently retired and relocated to [facility] from [place]. Historically, his medical provider has been [facility] in [place].    Family History  Problem Relation Age of Onset  . Cancer Father     colon and pancreas  . Epilepsy Son   . CAD Mother     MI in her 10s    BP 191/105 mmHg  Pulse 55  Ht 6\' 1"  (1.854 m)  Wt 301 lb (136.533 kg)  BMI 39.72 kg/m2  Review of Systems: See HPI above.    Objective:  Physical Exam:  Gen: NAD  Right foot/ankle: Diffuse soft tissue swelling of foot and ankle.  No increased warmth this leg.  Minimal redness though similar to left leg. FROM ankle without pain. Minimal TTP 1st MTP joint.  No achilles, plantar fascia, other lower leg tenderness. Negative ant drawer and talar tilt.   Negative syndesmotic compression. Negative thompsons. Thompsons test negative. NV intact distally.    Assessment & Plan:  1. Right foot pain - largely resolved at this point.  He did have elements of plantar fasciitis then achilles tendinitis based on his descriptions and office visits at [facility].  We reviewed treatment for these conditions - home exercise program, heel lifts, stretching.  Icing if needed.  Avoid inclines especially.  Follow up with me as needed.  Consider physical therapy, nitro patches, custom orthotics if pain recurs.

## 2014-04-02 NOTE — Telephone Encounter (Signed)
Patient has been made aware and verbalized understanding and he will drop off the formulary.       KP

## 2014-04-02 NOTE — Telephone Encounter (Signed)
-----   Message from Rosalita Chessman, DO sent at 04/02/2014  4:57 PM EST ----- We can try duoneb via neb qid ---its a little cheaper

## 2014-04-02 NOTE — Telephone Encounter (Signed)
Rosalita Chessman, DO  Ewing Schlein, CMA           Call gsk--- [phone]  Or go to [email or web address]  To see if he can get it from company

## 2014-04-02 NOTE — Assessment & Plan Note (Signed)
largely resolved at this point.  He did have elements of plantar fasciitis then achilles tendinitis based on his descriptions and office visits at [facility].  We reviewed treatment for these conditions - home exercise program, heel lifts, stretching.  Icing if needed.  Avoid inclines especially.  Follow up with me as needed.  Consider physical therapy, nitro patches, custom orthotics if pain recurs.

## 2014-04-06 MED ORDER — COMPRESSOR NEBULIZER MISC: 1.0000 | Status: DC | PRN

## 2014-04-06 MED ORDER — IPRATROPIUM-ALBUTEROL 0.5-2.5 (3) MG/3ML IN SOLN: 3.0000 mL | Freq: Four times a day (QID) | RESPIRATORY_TRACT | Status: DC | PRN

## 2014-04-06 NOTE — Telephone Encounter (Signed)
Can he take the duoneb daily in place of the [facility]. Directions stated qid.  Please advise     KP

## 2014-04-06 NOTE — Telephone Encounter (Signed)
It is qid prn

## 2014-04-06 NOTE — Telephone Encounter (Signed)
Patient returned phone call. Best # [phone]

## 2014-04-06 NOTE — Telephone Encounter (Signed)
Msg left to call the office     KP 

## 2014-04-06 NOTE — Telephone Encounter (Signed)
Discussed with patient and he has agreed to use the duoneb. Rx has been faxed along with a nebulizer. Per Dr.Lowne she thought the patient had one and that is why she ordered Neb solution.   KP

## 2014-04-15 NOTE — Progress Notes (Signed)
Subjective:    [name], male    DOB: 1945-06-14, 69 y.o.   MRN: [id number]  HPI  Pt states that his rt foot arch feels better and distal foot is better. But yesterday he reports pain on the very back of the heal. Just below the achilles tendon. Now hobbling  wallking with pain in this area.   Pt has no popliteal pain. No fevers. No chills. On diclofenac and antibiotic.    Past Medical History   Diagnosis  Date   .  Hyperlipidemia     .  Hypertension     .  Cough         associated with exposure to lumber yard   .  B12 deficiency     .  Lower extremity edema     .  COPD (chronic obstructive pulmonary disease)  01/2011       FVC 47%, FEV1 49%    .  Arthritis     .  Secondary pulmonary hypertension  01/2011       RV syst pressure 30-40 mm Hg on ECHO         Social History   .  Marital Status:  Married       Spouse Name:  N/A       Number of Children:  4   .  Years of Education:  N/A       Occupational History   .  retired         Social History Main Topics   .  Smoking status:  Never Smoker    .  Smokeless tobacco:  Never Used         Comment: occ alcohol   .  Alcohol Use:  Yes         Comment: 2-3 drinks occasionally   .  Drug Use:  No   .  Sexual Activity:  Yes       Other Topics  Concern   .  Not on file       Social History Narrative     Mr. Stith lives with his wife in [place]. He is recently retired and relocated to [place] from [place]. Historically, his medical provider has been [facility] in [place].       Past Surgical History   Procedure  Laterality  Date   .  Finger surgery  Right  11       rt middle finger   .  Vasectomy       .  Total shoulder arthroplasty  Right  01/21/2013       Procedure: SHOULDER HEMI ARTHROPLASTY;  Surgeon: Garald Balding, MD;  Location: [place];  Service: Orthopedics;  Laterality: Right;   .  Colonoscopy w/ polypectomy    12/19/07       one small polyp, sigmoid diverticulosis, lipoma rectum, agioectasia/AVM  in descending colon (Dr. Ronelle Nigh at [facility] in [place], [place])   .  Transthoracic echocardiogram    01/2011       EF 55-60%, mild asymmetric LVH, elevated RV systolic pressure       Family History   Problem  Relation  Age of Onset   .  Cancer  Father         colon and pancreas   .  Epilepsy  Son     .  CAD  Mother         MI in her 25s  No Known Allergies    Current Outpatient Prescriptions on File Prior to Visit   Medication  Sig  Dispense  Refill   .  albuterol (PROVENTIL HFA;VENTOLIN HFA) 108 (90 BASE) MCG/ACT inhaler  Inhale 2 puffs into the lungs every 6 (six) hours as needed for wheezing or shortness of breath.       Marland Kitchen  aspirin 81 MG tablet  Take 81 mg by mouth daily.       .  betamethasone dipropionate (DIPROLENE) 0.05 % cream  Apply topically 2 (two) times daily.  30 g  0   .  Cholecalciferol (VITAMIN D-3) 1000 UNITS CAPS  Take 1,000 Units by mouth.       .  clindamycin (CLEOCIN) 150 MG capsule  Take 3 capsules (450 mg total) by mouth 3 (three) times daily. May dispense as 150mg  capsules  90 capsule  0   .  diltiazem (CARDIZEM CD) 300 MG 24 hr capsule  Take 1 capsule (300 mg total) by mouth daily.  30 capsule  5   .  doxycycline (VIBRA-TABS) 100 MG tablet  Take 1 tablet (100 mg total) by mouth 2 (two) times daily.  20 tablet  0   .  Efinaconazole (JUBLIA) 10 % SOLN  Apply qd x 48 weeks  4 mL  11   .  furosemide (LASIX) 20 MG tablet  1 tablet (20 mg) by mouth daily.  30 tablet  5   .  glucose blood (ONE TOUCH ULTRA TEST) test strip  Check blood sugar twice daily  100 each  12   .  naproxen sodium (ANAPROX) 220 MG tablet  Take 220 mg by mouth 2 (two) times daily with a meal.       .  nystatin (MYCOSTATIN) powder  Apply topically 4 (four) times daily.  15 g  0   .  ONETOUCH DELICA LANCETS FINE MISC  Check blood sugar daily  100 each  12   .  tolnaftate (TINACTIN) 1 % external solution  Apply 1 application topically at bedtime.  10 mL  0   .  Umeclidinium-Vilanterol 62.5-25  MCG/INH AEPB  Inhale 1 puff into the lungs daily.  1 each  11   .  valsartan (DIOVAN) 160 MG tablet  TAKE 1 TABLET (160 MG TOTAL) BY MOUTH DAILY.  30 tablet  5   .  vitamin B-12 (CYANOCOBALAMIN) 1000 MCG tablet  Take 1,000 mcg by mouth daily.           No current facility-administered medications on file prior to visit.      BP 145/80 mmHg  Pulse 74  Temp(Src) 98.3 F (36.8 C) (Oral)  Ht 6\' 1"  (1.854 m)  Wt 301 lb 3.2 oz (136.623 kg)  BMI 39.75 kg/m2  SpO2 95%    Review of Systems  Constitutional: Negative for fever, chills and fatigue.  Respiratory: Negative for cough, chest tightness and wheezing.   Cardiovascular: Negative for chest pain and palpitations.  Musculoskeletal:        Rt foot pain- now heel/below achilles. Other area feel a lot better.  Neurological: Negative for dizziness.  Hematological: Negative for adenopathy. Does not bruise/bleed easily.   Objective:    Physical Exam  General- No acute distress. Pleasant patient. Neck- Full range of motion, no jvd Lungs- Clear, even and unlabored. Heart- regular rate and rhythm. Neurologic- CNII- XII grossly intact. Rt lower ext- neg homans signs. Calves look very near symmetric.   Rt foot- much less  warmth, no pain on palpation distal foot and arch is not tender. Posterior heel below achilles mild swollen and tender. Plantar and doriflexes hurts.    Assessment & Plan:                               Review of Systems     Objective:   Physical Exam        Assessment & Plan:

## 2014-04-23 NOTE — Telephone Encounter (Signed)
Call [facility]--- [phone]  Or go to [email or web address]  To see if he can get it from [employer] [employer] given to the patient and he agreed to give them a call.      KP

## 2014-04-23 NOTE — Telephone Encounter (Signed)
Go back to anoro ---but he will need to call company to see if he can get it from them for free.  I think we gave him info already--- didn't we

## 2014-04-23 NOTE — Telephone Encounter (Signed)
Spoke with pharmacy and the patient does not want a nebulizer or the duoneb, he is requesting an inhaler. Please advise     KP

## 2014-05-04 ENCOUNTER — Encounter: Payer: Self-pay | Admitting: Family Medicine

## 2014-05-04 ENCOUNTER — Ambulatory Visit (INDEPENDENT_AMBULATORY_CARE_PROVIDER_SITE_OTHER): Payer: Medicare Other | Admitting: Family Medicine

## 2014-05-04 VITALS — BP 132/76 | HR 65 | Temp 98.1°F | Wt 306.4 lb

## 2014-05-04 DIAGNOSIS — R05 Cough: Secondary | ICD-10-CM | POA: Diagnosis not present

## 2014-05-04 DIAGNOSIS — I10 Essential (primary) hypertension: Secondary | ICD-10-CM | POA: Diagnosis not present

## 2014-05-04 DIAGNOSIS — H918X3 Other specified hearing loss, bilateral: Secondary | ICD-10-CM

## 2014-05-04 DIAGNOSIS — E1165 Type 2 diabetes mellitus with hyperglycemia: Secondary | ICD-10-CM

## 2014-05-04 DIAGNOSIS — R059 Cough, unspecified: Secondary | ICD-10-CM

## 2014-05-04 DIAGNOSIS — IMO0002 Reserved for concepts with insufficient information to code with codable children: Secondary | ICD-10-CM

## 2014-05-04 DIAGNOSIS — E119 Type 2 diabetes mellitus without complications: Secondary | ICD-10-CM

## 2014-05-04 DIAGNOSIS — H9193 Unspecified hearing loss, bilateral: Secondary | ICD-10-CM

## 2014-05-04 MED ORDER — GUAIFENESIN-CODEINE 100-10 MG/5ML PO SYRP: 5.0000 mL | ORAL_SOLUTION | Freq: Three times a day (TID) | ORAL | Status: DC | PRN

## 2014-05-04 NOTE — Assessment & Plan Note (Signed)
Check labs  Diet controlled 

## 2014-05-04 NOTE — Progress Notes (Signed)
Subjective:    [name], male    DOB: 1946-01-19, 69 y.o.   MRN: [id number]  HPI  Patient here for f/u dm and bp.   Pt also c/o hearing loss.  His wife is c/o him not hearing---turning tv up load, etc  HPI HYPERTENSION  Blood pressure range-see home reading  Chest pain- no      Dyspnea- no Lightheadedness- no   Edema- no Other side effects - no   Medication compliance: yes  Low salt diet- yes   DIABETES  Blood Sugar ranges-see home readings   Polyuria- no New Visual problems- no Hypoglycemic symptoms- no Other side effects-no Medication compliance - good  Last eye exam- 2015  Foot exam- today   Past Medical History  Diagnosis Date  . Hyperlipidemia   . Hypertension   . 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 B12 deficiency   . Lower extremity edema   . COPD (chronic obstructive pulmonary disease) 01/2011    FVC 47%, FEV1 49%   . Arthritis   . Secondary pulmonary hypertension 01/2011    RV syst pressure 30-40 mm Hg on ECHO    Current Outpatient Prescriptions  Medication Sig Dispense Refill  . albuterol (PROVENTIL HFA;VENTOLIN HFA) 108 (90 BASE) MCG/ACT inhaler Inhale 2 puffs into the lungs every 6 (six) hours as needed for wheezing or shortness of breath.    Marland Kitchen aspirin 81 MG tablet Take 81 mg by mouth daily.    . betamethasone dipropionate (DIPROLENE) 0.05 % cream Apply topically 2 (two) times daily. 30 g 0  . Cholecalciferol (VITAMIN D-3) 1000 UNITS CAPS Take 1,000 Units by mouth.    . diltiazem (CARDIZEM CD) 300 MG 24 hr capsule Take 1 capsule (300 mg total) by mouth daily. 30 capsule 5  . Efinaconazole (JUBLIA) 10 % SOLN Apply qd x 48 weeks 4 mL 11  . furosemide (LASIX) 20 MG tablet 1 tablet (20 mg) by mouth daily. 30 tablet 5  . glucose blood (ONE TOUCH ULTRA TEST) test strip Check blood sugar twice daily 100 each 12  . naproxen sodium (ANAPROX) 220 MG tablet Take 220 mg by mouth 2 (two) times daily with a meal.    . nystatin  (MYCOSTATIN) powder Apply topically 4 (four) times daily. 15 g 0  . ONETOUCH DELICA LANCETS FINE MISC Check blood sugar daily 100 each 12  . tolnaftate (TINACTIN) 1 % external solution Apply 1 application topically at bedtime. 10 mL 0  . Umeclidinium-Vilanterol 62.5-25 MCG/INH AEPB Inhale 1 puff into the lungs daily. 1 each 11  . valsartan (DIOVAN) 160 MG tablet TAKE 1 TABLET (160 MG TOTAL) BY MOUTH DAILY. 30 tablet 5  . vitamin B-12 (CYANOCOBALAMIN) 1000 MCG tablet Take 1,000 mcg by mouth daily.    Marland Kitchen guaiFENesin-codeine (ROBITUSSIN AC) 100-10 MG/5ML syrup Take 5 mLs by mouth 3 (three) times daily as needed for cough. 120 mL 0   No current facility-administered medications for this visit.    Review of Systems  Constitutional: Negative for fatigue and unexpected weight change.  Respiratory: Negative for cough and shortness of breath.   Cardiovascular: Negative for chest pain and palpitations.  Endocrine: Negative for cold intolerance, heat intolerance, polydipsia, polyphagia and polyuria.  Psychiatric/Behavioral: Negative for dysphoric mood, decreased concentration and agitation. The patient is not nervous/anxious.        Objective:    Physical Exam  Constitutional: He is oriented to person, place, and time.  Vital signs are normal. He appears well-developed and well-nourished. He is sleeping.  HENT:  Head: Normocephalic and atraumatic.  Mouth/Throat: Oropharynx is clear and moist.  Eyes: EOM are normal. Pupils are equal, round, and reactive to light.  Neck: Normal range of motion. Neck supple. No thyromegaly present.  Cardiovascular: Normal rate and regular rhythm.   No murmur heard. Pulmonary/Chest: Effort normal and breath sounds normal. No respiratory distress. He has no wheezes. He has no rales. He exhibits no tenderness.  Musculoskeletal: He exhibits no edema or tenderness.  Neurological: He is alert and oriented to person, place, and time.  Skin: Skin is warm and dry.    Psychiatric: He has a normal mood and affect. His behavior is normal. Judgment and thought content normal.  Sensory exam of the foot is normal, tested with the monofilament. Good pulses, no lesions or ulcers, good peripheral pulses.   BP 132/76 mmHg  Pulse 65  Temp(Src) 98.1 F (36.7 C) (Oral)  Wt 306 lb 6.4 oz (138.982 kg)  SpO2 97% Wt Readings from Last 3 Encounters:  05/04/14 306 lb 6.4 oz (138.982 kg)  04/01/14 301 lb (136.533 kg)  03/25/14 301 lb 3.2 oz (136.623 kg)     Lab Results  Component Value Date   WBC 12.4* 01/18/2014   HGB 15.6 01/18/2014   HCT 46.0 01/18/2014   PLT 243 01/18/2014   GLUCOSE 98 01/18/2014   CHOL 132 11/21/2013   TRIG 102 11/21/2013   HDL 45 11/21/2013   LDLCALC 67 11/21/2013   ALT 18 11/21/2013   AST 19 11/21/2013   NA 141 01/18/2014   K 3.7 01/18/2014   CL 101 01/18/2014   CREATININE 1.40* 01/18/2014   BUN 18 01/18/2014   CO2 34* 11/21/2013   TSH 0.746 09/09/2012   INR 0.95 01/14/2013   HGBA1C 5.7* 11/21/2013   MICROALBUR 2.96* 11/21/2013    Dg Foot Complete Right  03/25/2014   CLINICAL DATA:  Foot pain for 4 days.  EXAM: RIGHT FOOT COMPLETE - 3+ VIEW  COMPARISON:  None.  FINDINGS: There are moderate degenerative changes involving the first tarsal metatarsal joint, metatarsal phalangeal joint and interphalangeal joint. Degenerative changes also noted at the fifth metatarsal phalangeal joint. No acute bony findings or bone lesions. There are calcaneal spurring changes. The Achilles tendon appears thickened and there may be chronic Achilles tendinopathy.  IMPRESSION: Degenerative changes but no acute bony findings.  Calcaneal spurring changes with a thickened appearing Achilles tendon which may suggest chronic tendinopathy.   Electronically Signed   By: Kalman Jewels M.D.   On: 03/25/2014 14:35       Assessment & Plan:   Problem List Items Addressed This Visit    Hypertension   Relevant Orders   Basic metabolic panel   Microalbumin  / creatinine urine ratio   POCT urinalysis dipstick    Other Visit Diagnoses    Diabetes mellitus type II, controlled    -  Primary    Relevant Orders    Hemoglobin A1c    Hepatic function panel    Lipid panel    Microalbumin / creatinine urine ratio    POCT urinalysis dipstick    Hearing loss associated with syndrome of both ears        Relevant Orders    Ambulatory referral to Audiology    Cough        Relevant Medications    guaiFENesin-codeine (ROBITUSSIN AC) 100-10 MG/5ML syrup        Kendrick Fries  Lowne, DO

## 2014-05-04 NOTE — Patient Instructions (Signed)
Diabetes and Standards of Medical Care Diabetes is complicated. You may find that your diabetes team includes a dietitian, nurse, diabetes educator, eye doctor, and more. To help everyone know what is going on and to help you get the care you deserve, the following schedule of care was developed to help keep you on track. Below are the tests, exams, vaccines, medicines, education, and plans you will need. HbA1c test This test shows how well you have controlled your glucose over the past 2-3 months. It is used to see if your diabetes management plan needs to be adjusted.   It is performed at least 2 times a year if you are meeting treatment goals.  It is performed 4 times a year if therapy has changed or if you are not meeting treatment goals. Blood pressure test  This test is performed at every routine medical visit. The goal is less than 140/90 mm Hg for most people, but 130/80 mm Hg in some cases. Ask your health care provider about your goal. Dental exam  Follow up with the dentist regularly. Eye exam  If you are diagnosed with type 1 diabetes as a child, get an exam upon reaching the age of 57 years or older and have had diabetes for 3-5 years. Yearly eye exams are recommended after that initial eye exam.  If you are diagnosed with type 1 diabetes as an adult, get an exam within 5 years of diagnosis and then yearly.  If you are diagnosed with type 2 diabetes, get an exam as soon as possible after the diagnosis and then yearly. Foot care exam  Visual foot exams are performed at every routine medical visit. The exams check for cuts, injuries, or other problems with the feet.  A comprehensive foot exam should be done yearly. This includes visual inspection as well as assessing foot pulses and testing for loss of sensation.  Check your feet nightly for cuts, injuries, or other problems with your feet. Tell your health care provider if anything is not healing. Kidney function test (urine  microalbumin)  This test is performed once a year.  Type 1 diabetes: The first test is performed 5 years after diagnosis.  Type 2 diabetes: The first test is performed at the time of diagnosis.  A serum creatinine and estimated glomerular filtration rate (eGFR) test is done once a year to assess the level of chronic kidney disease (CKD), if present. Lipid profile (cholesterol, HDL, LDL, triglycerides)  Performed every 5 years for most people.  The goal for LDL is less than 100 mg/dL. If you are at high risk, the goal is less than 70 mg/dL.  The goal for HDL is 40 mg/dL-50 mg/dL for men and 50 mg/dL-60 mg/dL for women. An HDL cholesterol of 60 mg/dL or higher gives some protection against heart disease.  The goal for triglycerides is less than 150 mg/dL. Influenza vaccine, pneumococcal vaccine, and hepatitis B vaccine  The influenza vaccine is recommended yearly.  It is recommended that people with diabetes who are over 36 years old get the pneumonia vaccine. In some cases, two separate shots may be given. Ask your health care provider if your pneumonia vaccination is up to date.  The hepatitis B vaccine is also recommended for adults with diabetes. Diabetes self-management education  Education is recommended at diagnosis and ongoing as needed. Treatment plan  Your treatment plan is reviewed at every medical visit. Document Released: 12/25/2008 Document Revised: 07/14/2013 Document Reviewed: 07/30/2012 [facility] Patient Information 2015 [place]. This information is not intended to replace advice given to you by your health care provider. Make sure you discuss any questions you have with your health care provider.  

## 2014-05-04 NOTE — Assessment & Plan Note (Addendum)
Stable Cont diovan and cardizem

## 2014-05-04 NOTE — Assessment & Plan Note (Signed)
Stable con't anoro and albuterol

## 2014-05-04 NOTE — Progress Notes (Signed)
Pre visit review using our clinic review tool, if applicable. No additional management support is needed unless otherwise documented below in the visit note. 

## 2014-05-07 ENCOUNTER — Other Ambulatory Visit (INDEPENDENT_AMBULATORY_CARE_PROVIDER_SITE_OTHER): Payer: Medicare Other

## 2014-05-07 DIAGNOSIS — I10 Essential (primary) hypertension: Secondary | ICD-10-CM

## 2014-05-07 DIAGNOSIS — E119 Type 2 diabetes mellitus without complications: Secondary | ICD-10-CM

## 2014-05-07 LAB — LIPID PANEL
CHOLESTEROL: 151 mg/dL (ref 0–200)
HDL: 49.3 mg/dL (ref >39.00)
LDL Cholesterol: 77 mg/dL (ref 0–99)
NONHDL: 101.7
Total CHOL/HDL Ratio: 3
Triglycerides: 126 mg/dL (ref 0.0–149.0)
VLDL: 25.2 mg/dL (ref 0.0–40.0)

## 2014-05-07 LAB — HEPATIC FUNCTION PANEL
ALK PHOS: 84 U/L (ref 39–117)
ALT: 19 U/L (ref 0–53)
AST: 20 U/L (ref 0–37)
Albumin: 3.9 g/dL (ref 3.5–5.2)
BILIRUBIN DIRECT: 0.2 mg/dL (ref 0.0–0.3)
BILIRUBIN TOTAL: 0.7 mg/dL (ref 0.2–1.2)
TOTAL PROTEIN: 7.1 g/dL (ref 6.0–8.3)

## 2014-05-07 LAB — MICROALBUMIN / CREATININE URINE RATIO
Creatinine,U: 18.6 mg/dL
MICROALB UR: 4.9 mg/dL — AB (ref 0.0–1.9)
Microalb Creat Ratio: 26.4 mg/g (ref 0.0–30.0)

## 2014-05-07 LAB — BASIC METABOLIC PANEL
BUN: 17 mg/dL (ref 6–23)
CALCIUM: 9.7 mg/dL (ref 8.4–10.5)
CO2: 35 mEq/L — ABNORMAL HIGH (ref 19–32)
CREATININE: 1.42 mg/dL (ref 0.40–1.50)
Chloride: 102 mEq/L (ref 96–112)
GFR: 52.53 mL/min — AB (ref >60.00)
Glucose, Bld: 86 mg/dL (ref 70–99)
POTASSIUM: 3.9 meq/L (ref 3.5–5.1)
Sodium: 140 mEq/L (ref 135–145)

## 2014-05-07 LAB — HEMOGLOBIN A1C: HEMOGLOBIN A1C: 5.5 % (ref 4.6–6.5)

## 2014-05-29 DIAGNOSIS — N183 Chronic kidney disease, stage 3 (moderate): Secondary | ICD-10-CM | POA: Diagnosis not present

## 2014-05-29 DIAGNOSIS — I10 Essential (primary) hypertension: Secondary | ICD-10-CM | POA: Diagnosis not present

## 2014-05-29 DIAGNOSIS — R809 Proteinuria, unspecified: Secondary | ICD-10-CM | POA: Diagnosis not present

## 2014-05-29 DIAGNOSIS — R312 Other microscopic hematuria: Secondary | ICD-10-CM | POA: Diagnosis not present

## 2014-06-03 ENCOUNTER — Ambulatory Visit: Payer: Medicare Other | Attending: Audiology | Admitting: Audiology

## 2014-06-03 DIAGNOSIS — H9041 Sensorineural hearing loss, unilateral, right ear, with unrestricted hearing on the contralateral side: Secondary | ICD-10-CM | POA: Diagnosis not present

## 2014-06-03 DIAGNOSIS — H93291 Other abnormal auditory perceptions, right ear: Secondary | ICD-10-CM

## 2014-06-03 DIAGNOSIS — M19019 Primary osteoarthritis, unspecified shoulder: Secondary | ICD-10-CM | POA: Diagnosis not present

## 2014-06-03 DIAGNOSIS — H905 Unspecified sensorineural hearing loss: Secondary | ICD-10-CM

## 2014-06-03 DIAGNOSIS — H9072 Mixed conductive and sensorineural hearing loss, unilateral, left ear, with unrestricted hearing on the contralateral side: Secondary | ICD-10-CM | POA: Diagnosis not present

## 2014-06-03 DIAGNOSIS — I10 Essential (primary) hypertension: Secondary | ICD-10-CM | POA: Insufficient documentation

## 2014-06-03 DIAGNOSIS — H9319 Tinnitus, unspecified ear: Secondary | ICD-10-CM | POA: Insufficient documentation
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
## 2014-06-04 DIAGNOSIS — H2513 Age-related nuclear cataract, bilateral: Secondary | ICD-10-CM | POA: Diagnosis not present

## 2014-06-30 DIAGNOSIS — I10 Essential (primary) hypertension: Secondary | ICD-10-CM | POA: Diagnosis not present

## 2014-07-10 DIAGNOSIS — I10 Essential (primary) hypertension: Secondary | ICD-10-CM | POA: Diagnosis not present

## 2014-07-13 ENCOUNTER — Other Ambulatory Visit: Payer: Self-pay

## 2014-07-13 DIAGNOSIS — I10 Essential (primary) hypertension: Secondary | ICD-10-CM

## 2014-07-13 MED ORDER — VALSARTAN 160 MG PO TABS: ORAL_TABLET | ORAL | Status: DC

## 2014-07-13 MED ORDER — FUROSEMIDE 20 MG PO TABS: ORAL_TABLET | ORAL | Status: DC

## 2014-07-13 MED ORDER — DILTIAZEM HCL ER COATED BEADS 300 MG PO CP24: 300.0000 mg | ORAL_CAPSULE | Freq: Every day | ORAL | Status: AC

## 2014-07-28 ENCOUNTER — Telehealth: Payer: Self-pay | Admitting: Family Medicine

## 2014-07-28 DIAGNOSIS — B353 Tinea pedis: Secondary | ICD-10-CM

## 2014-07-28 MED ORDER — BETAMETHASONE DIPROPIONATE 0.05 % EX CREA: TOPICAL_CREAM | Freq: Two times a day (BID) | CUTANEOUS | Status: DC

## 2014-07-28 MED ORDER — NYSTATIN 100000 UNIT/GM EX POWD: Freq: Four times a day (QID) | CUTANEOUS | Status: DC

## 2014-07-28 NOTE — Telephone Encounter (Signed)
[facility] [address] [place] betamethasone diproplonate cream 0.05% nystop 5 gram

## 2014-07-28 NOTE — Telephone Encounter (Signed)
Rx.    KP

## 2014-07-29 ENCOUNTER — Telehealth: Payer: Self-pay | Admitting: Family Medicine

## 2014-07-29 NOTE — Telephone Encounter (Signed)
Pre Visit letter sent  °

## 2014-08-20 ENCOUNTER — Telehealth: Payer: Self-pay | Admitting: *Deleted

## 2014-08-20 ENCOUNTER — Encounter: Payer: Self-pay | Admitting: *Deleted

## 2014-08-20 NOTE — Telephone Encounter (Signed)
Pre-Visit Call completed with patient and chart updated.   Pre-Visit Info documented in Specialty Comments under [employer].    

## 2014-08-21 ENCOUNTER — Encounter: Payer: Self-pay | Admitting: Family Medicine

## 2014-08-21 ENCOUNTER — Ambulatory Visit (INDEPENDENT_AMBULATORY_CARE_PROVIDER_SITE_OTHER): Payer: Medicare Other | Admitting: Family Medicine

## 2014-08-21 VITALS — BP 148/72 | HR 58 | Temp 97.9°F | Ht 73.0 in | Wt 309.6 lb

## 2014-08-21 DIAGNOSIS — R319 Hematuria, unspecified: Secondary | ICD-10-CM

## 2014-08-21 DIAGNOSIS — R8299 Other abnormal findings in urine: Secondary | ICD-10-CM | POA: Diagnosis not present

## 2014-08-21 DIAGNOSIS — E785 Hyperlipidemia, unspecified: Secondary | ICD-10-CM | POA: Diagnosis not present

## 2014-08-21 DIAGNOSIS — R8290 Unspecified abnormal findings in urine: Secondary | ICD-10-CM | POA: Diagnosis not present

## 2014-08-21 DIAGNOSIS — I10 Essential (primary) hypertension: Secondary | ICD-10-CM

## 2014-08-21 DIAGNOSIS — Z0000 Encounter for general adult medical examination without abnormal findings: Secondary | ICD-10-CM | POA: Diagnosis not present

## 2014-08-21 DIAGNOSIS — E1121 Type 2 diabetes mellitus with diabetic nephropathy: Secondary | ICD-10-CM

## 2014-08-21 LAB — LIPID PANEL
Cholesterol: 155 mg/dL (ref 0–200)
HDL: 52.5 mg/dL (ref >39.00)
LDL CALC: 88 mg/dL (ref 0–99)
NONHDL: 102.5
TRIGLYCERIDES: 72 mg/dL (ref 0.0–149.0)
Total CHOL/HDL Ratio: 3
VLDL: 14.4 mg/dL (ref 0.0–40.0)

## 2014-08-21 LAB — POCT URINALYSIS DIPSTICK
Bilirubin, UA: NEGATIVE
Glucose, UA: NEGATIVE
KETONES UA: NEGATIVE
LEUKOCYTES UA: NEGATIVE
NITRITE UA: NEGATIVE
PH UA: 6
Spec Grav, UA: 1.02
UROBILINOGEN UA: 0.2

## 2014-08-21 LAB — PSA: PSA: 5.18 ng/mL — ABNORMAL HIGH (ref 0.10–4.00)

## 2014-08-21 LAB — BASIC METABOLIC PANEL
BUN: 21 mg/dL (ref 6–23)
CHLORIDE: 103 meq/L (ref 96–112)
CO2: 32 mEq/L (ref 19–32)
Calcium: 9.6 mg/dL (ref 8.4–10.5)
Creatinine, Ser: 1.39 mg/dL (ref 0.40–1.50)
GFR: 53.8 mL/min — ABNORMAL LOW (ref >60.00)
Glucose, Bld: 93 mg/dL (ref 70–99)
POTASSIUM: 4 meq/L (ref 3.5–5.1)
Sodium: 141 mEq/L (ref 135–145)

## 2014-08-21 LAB — HEPATIC FUNCTION PANEL
ALT: 15 U/L (ref 0–53)
AST: 15 U/L (ref 0–37)
Albumin: 4 g/dL (ref 3.5–5.2)
Alkaline Phosphatase: 86 U/L (ref 39–117)
Bilirubin, Direct: 0.2 mg/dL (ref 0.0–0.3)
TOTAL PROTEIN: 6.5 g/dL (ref 6.0–8.3)
Total Bilirubin: 0.8 mg/dL (ref 0.2–1.2)

## 2014-08-21 LAB — CBC WITH DIFFERENTIAL/PLATELET
Basophils Absolute: 0.1 10*3/uL (ref 0.0–0.1)
Basophils Relative: 1 % (ref 0.0–3.0)
Eosinophils Absolute: 0.2 10*3/uL (ref 0.0–0.7)
Eosinophils Relative: 3.1 % (ref 0.0–5.0)
HCT: 43.6 % (ref 39.0–52.0)
HEMOGLOBIN: 14.6 g/dL (ref 13.0–17.0)
LYMPHS ABS: 1.7 10*3/uL (ref 0.7–4.0)
Lymphocytes Relative: 26.4 % (ref 12.0–46.0)
MCHC: 33.4 g/dL (ref 30.0–36.0)
MCV: 85.8 fl (ref 78.0–100.0)
MONOS PCT: 9 % (ref 3.0–12.0)
Monocytes Absolute: 0.6 10*3/uL (ref 0.1–1.0)
Neutro Abs: 3.9 10*3/uL (ref 1.4–7.7)
Neutrophils Relative %: 60.5 % (ref 43.0–77.0)
PLATELETS: 217 10*3/uL (ref 150.0–400.0)
RBC: 5.09 Mil/uL (ref 4.22–5.81)
RDW: 14.4 % (ref 11.5–15.5)
WBC: 6.5 10*3/uL (ref 4.0–10.5)

## 2014-08-21 LAB — MICROALBUMIN / CREATININE URINE RATIO
CREATININE, U: 72 mg/dL
MICROALB UR: 36.9 mg/dL — AB (ref 0.0–1.9)
MICROALB/CREAT RATIO: 51.3 mg/g — AB (ref 0.0–30.0)

## 2014-08-21 LAB — HEMOGLOBIN A1C: HEMOGLOBIN A1C: 5 % (ref 4.6–6.5)

## 2014-08-21 MED ORDER — VALSARTAN 320 MG PO TABS: 320.0000 mg | ORAL_TABLET | Freq: Every day | ORAL | Status: DC

## 2014-08-21 NOTE — Patient Instructions (Signed)
Preventive Care for Adults A healthy lifestyle and preventive care can promote health and wellness. Preventive health guidelines for men include the following key practices:  A routine yearly physical is a good way to check with your health care provider about your health and preventative screening. It is a chance to share any concerns and updates on your health and to receive a thorough exam.  Visit your dentist for a routine exam and preventative care every 6 months. Brush your teeth twice a day and floss once a day. Good oral hygiene prevents tooth decay and gum disease.  The frequency of eye exams is based on your age, health, family medical history, use of contact lenses, and other factors. Follow your health care provider's recommendations for frequency of eye exams.  Eat a healthy diet. Foods such as vegetables, fruits, whole grains, low-fat dairy products, and lean protein foods contain the nutrients you need without too many calories. Decrease your intake of foods high in solid fats, added sugars, and salt. Eat the right amount of calories for you.Get information about a proper diet from your health care provider, if necessary.  Regular physical exercise is one of the most important things you can do for your health. Most adults should get at least 150 minutes of moderate-intensity exercise (any activity that increases your heart rate and causes you to sweat) each week. In addition, most adults need muscle-strengthening exercises on 2 or more days a week.  Maintain a healthy weight. The body mass index (BMI) is a screening tool to identify possible weight problems. It provides an estimate of body fat based on height and weight. Your health care provider can find your BMI and can help you achieve or maintain a healthy weight.For adults 20 years and older:  A BMI below 18.5 is considered underweight.  A BMI of 18.5 to 24.9 is normal.  A BMI of 25 to 29.9 is considered overweight.  A BMI  of 30 and above is considered obese.  Maintain normal blood lipids and cholesterol levels by exercising and minimizing your intake of saturated fat. Eat a balanced diet with plenty of fruit and vegetables. Blood tests for lipids and cholesterol should begin at age 50 and be repeated every 5 years. If your lipid or cholesterol levels are high, you are over 50, or you are at high risk for heart disease, you may need your cholesterol levels checked more frequently.Ongoing high lipid and cholesterol levels should be treated with medicines if diet and exercise are not working.  If you smoke, find out from your health care provider how to quit. If you do not use tobacco, do not start.  Lung cancer screening is recommended for adults aged 73-80 years who are at high risk for developing lung cancer because of a history of smoking. A yearly low-dose CT scan of the lungs is recommended for people who have at least a 30-pack-year history of smoking and are a current smoker or have quit within the past 15 years. A pack year of smoking is smoking an average of 1 pack of cigarettes a day for 1 year (for example: 1 pack a day for 30 years or 2 packs a day for 15 years). Yearly screening should continue until the smoker has stopped smoking for at least 15 years. Yearly screening should be stopped for people who develop a health problem that would prevent them from having lung cancer treatment.  If you choose to drink alcohol, do not have more than  2 drinks per day. One drink is considered to be 12 ounces (355 mL) of beer, 5 ounces (148 mL) of wine, or 1.5 ounces (44 mL) of liquor.  Avoid use of street drugs. Do not share needles with anyone. Ask for help if you need support or instructions about stopping the use of drugs.  High blood pressure causes heart disease and increases the risk of stroke. Your blood pressure should be checked at least every 1-2 years. Ongoing high blood pressure should be treated with  medicines, if weight loss and exercise are not effective.  If you are 45-79 years old, ask your health care provider if you should take aspirin to prevent heart disease.  Diabetes screening involves taking a blood sample to check your fasting blood sugar level. This should be done once every 3 years, after age 45, if you are within normal weight and without risk factors for diabetes. Testing should be considered at a younger age or be carried out more frequently if you are overweight and have at least 1 risk factor for diabetes.  Colorectal cancer can be detected and often prevented. Most routine colorectal cancer screening begins at the age of 50 and continues through age 75. However, your health care provider may recommend screening at an earlier age if you have risk factors for colon cancer. On a yearly basis, your health care provider may provide home test kits to check for hidden blood in the stool. Use of a small camera at the end of a tube to directly examine the colon (sigmoidoscopy or colonoscopy) can detect the earliest forms of colorectal cancer. Talk to your health care provider about this at age 50, when routine screening begins. Direct exam of the colon should be repeated every 5-10 years through age 75, unless early forms of precancerous polyps or small growths are found.  People who are at an increased risk for hepatitis B should be screened for this virus. You are considered at high risk for hepatitis B if:  You were born in a country where hepatitis B occurs often. Talk with your health care provider about which countries are considered high risk.  Your parents were born in a high-risk country and you have not received a shot to protect against hepatitis B (hepatitis B vaccine).  You have HIV or AIDS.  You use needles to inject street drugs.  You live with, or have sex with, someone who has hepatitis B.  You are a man who has sex with other men (MSM).  You get hemodialysis  treatment.  You take certain medicines for conditions such as cancer, organ transplantation, and autoimmune conditions.  Hepatitis C blood testing is recommended for all people born from 1945 through 1965 and any individual with known risks for hepatitis C.  Practice safe sex. Use condoms and avoid high-risk sexual practices to reduce the spread of sexually transmitted infections (STIs). STIs include gonorrhea, chlamydia, syphilis, trichomonas, herpes, HPV, and human immunodeficiency virus (HIV). Herpes, HIV, and HPV are viral illnesses that have no cure. They can result in disability, cancer, and death.  If you are at risk of being infected with HIV, it is recommended that you take a prescription medicine daily to prevent HIV infection. This is called preexposure prophylaxis (PrEP). You are considered at risk if:  You are a man who has sex with other men (MSM) and have other risk factors.  You are a heterosexual man, are sexually active, and are at increased risk for HIV infection.    You take drugs by injection.  You are sexually active with a partner who has HIV.  Talk with your health care provider about whether you are at high risk of being infected with HIV. If you choose to begin PrEP, you should first be tested for HIV. You should then be tested every 3 months for as long as you are taking PrEP.  A one-time screening for abdominal aortic aneurysm (AAA) and surgical repair of large AAAs by ultrasound are recommended for men ages 32 to 67 years who are current or former smokers.  Healthy men should no longer receive prostate-specific antigen (PSA) blood tests as part of routine cancer screening. Talk with your health care provider about prostate cancer screening.  Testicular cancer screening is not recommended for adult males who have no symptoms. Screening includes self-exam, a health care provider exam, and other screening tests. Consult with your health care provider about any symptoms  you have or any concerns you have about testicular cancer.  Use sunscreen. Apply sunscreen liberally and repeatedly throughout the day. You should seek shade when your shadow is shorter than you. Protect yourself by wearing long sleeves, pants, a wide-brimmed hat, and sunglasses year round, whenever you are outdoors.  Once a month, do a whole-body skin exam, using a mirror to look at the skin on your back. Tell your health care provider about new moles, moles that have irregular borders, moles that are larger than a pencil eraser, or moles that have changed in shape or color.  Stay current with required vaccines (immunizations).  Influenza vaccine. All adults should be immunized every year.  Tetanus, diphtheria, and acellular pertussis (Td, Tdap) vaccine. An adult who has not previously received Tdap or who does not know his vaccine status should receive 1 dose of Tdap. This initial dose should be followed by tetanus and diphtheria toxoids (Td) booster doses every 10 years. Adults with an unknown or incomplete history of completing a 3-dose immunization series with Td-containing vaccines should begin or complete a primary immunization series including a Tdap dose. Adults should receive a Td booster every 10 years.  Varicella vaccine. An adult without evidence of immunity to varicella should receive 2 doses or a second dose if he has previously received 1 dose.  Human papillomavirus (HPV) vaccine. Males aged 68-21 years who have not received the vaccine previously should receive the 3-dose series. Males aged 22-26 years may be immunized. Immunization is recommended through the age of 6 years for any male who has sex with males and did not get any or all doses earlier. Immunization is recommended for any person with an immunocompromised condition through the age of 49 years if he did not get any or all doses earlier. During the 3-dose series, the second dose should be obtained 4-8 weeks after the first  dose. The third dose should be obtained 24 weeks after the first dose and 16 weeks after the second dose.  Zoster vaccine. One dose is recommended for adults aged 50 years or older unless certain conditions are present.  Measles, mumps, and rubella (MMR) vaccine. Adults born before [birth year] generally are considered immune to measles and mumps. Adults born in [birth year] or later should have 1 or more doses of MMR vaccine unless there is a contraindication to the vaccine or there is laboratory evidence of immunity to each of the three diseases. A routine second dose of MMR vaccine should be obtained at least 28 days after the first dose for students attending postsecondary  schools, health care workers, or international travelers. People who received inactivated measles vaccine or an unknown type of measles vaccine during 1963-1967 should receive 2 doses of MMR vaccine. People who received inactivated mumps vaccine or an unknown type of mumps vaccine before 1979 and are at high risk for mumps infection should consider immunization with 2 doses of MMR vaccine. Unvaccinated health care workers born before 1957 who lack laboratory evidence of measles, mumps, or rubella immunity or laboratory confirmation of disease should consider measles and mumps immunization with 2 doses of MMR vaccine or rubella immunization with 1 dose of MMR vaccine.  Pneumococcal 13-valent conjugate (PCV13) vaccine. When indicated, a person who is uncertain of his immunization history and has no record of immunization should receive the PCV13 vaccine. An adult aged 19 years or older who has certain medical conditions and has not been previously immunized should receive 1 dose of PCV13 vaccine. This PCV13 should be followed with a dose of pneumococcal polysaccharide (PPSV23) vaccine. The PPSV23 vaccine dose should be obtained at least 8 weeks after the dose of PCV13 vaccine. An adult aged 19 years or older who has certain medical conditions and  previously received 1 or more doses of PPSV23 vaccine should receive 1 dose of PCV13. The PCV13 vaccine dose should be obtained 1 or more years after the last PPSV23 vaccine dose.  Pneumococcal polysaccharide (PPSV23) vaccine. When PCV13 is also indicated, PCV13 should be obtained first. All adults aged 65 years and older should be immunized. An adult younger than age 65 years who has certain medical conditions should be immunized. Any person who resides in a nursing home or long-term care facility should be immunized. An adult smoker should be immunized. People with an immunocompromised condition and certain other conditions should receive both PCV13 and PPSV23 vaccines. People with human immunodeficiency virus (HIV) infection should be immunized as soon as possible after diagnosis. Immunization during chemotherapy or radiation therapy should be avoided. Routine use of PPSV23 vaccine is not recommended for American Indians, [place] Natives, or people younger than 65 years unless there are medical conditions that require PPSV23 vaccine. When indicated, people who have unknown immunization and have no record of immunization should receive PPSV23 vaccine. One-time revaccination 5 years after the first dose of PPSV23 is recommended for people aged 19-64 years who have chronic kidney failure, nephrotic syndrome, asplenia, or immunocompromised conditions. People who received 1-2 doses of PPSV23 before age 65 years should receive another dose of PPSV23 vaccine at age 65 years or later if at least 5 years have passed since the previous dose. Doses of PPSV23 are not needed for people immunized with PPSV23 at or after age 65 years.  Meningococcal vaccine. Adults with asplenia or persistent complement component deficiencies should receive 2 doses of quadrivalent meningococcal conjugate (MenACWY-D) vaccine. The doses should be obtained at least 2 months apart. Microbiologists working with certain meningococcal bacteria,  military recruits, people at risk during an outbreak, and people who travel to or live in countries with a high rate of meningitis should be immunized. A first-year college student up through age 21 years who is living in a residence hall should receive a dose if he did not receive a dose on or after his 16th birthday. Adults who have certain high-risk conditions should receive one or more doses of vaccine.  Hepatitis A vaccine. Adults who wish to be protected from this disease, have certain high-risk conditions, work with hepatitis A-infected animals, work in hepatitis A research labs, or   travel to or work in countries with a high rate of hepatitis A should be immunized. Adults who were previously unvaccinated and who anticipate close contact with an international adoptee during the first 60 days after arrival in the [place] from a country with a high rate of hepatitis A should be immunized.  Hepatitis B vaccine. Adults should be immunized if they wish to be protected from this disease, have certain high-risk conditions, may be exposed to blood or other infectious body fluids, are household contacts or sex partners of hepatitis B positive people, are clients or workers in certain care facilities, or travel to or work in countries with a high rate of hepatitis B.  Haemophilus influenzae type b (Hib) vaccine. A previously unvaccinated person with asplenia or sickle cell disease or having a scheduled splenectomy should receive 1 dose of Hib vaccine. Regardless of previous immunization, a recipient of a hematopoietic stem cell transplant should receive a 3-dose series 6-12 months after his successful transplant. Hib vaccine is not recommended for adults with HIV infection. Preventive Service / Frequency Ages 52 to 17  Blood pressure check.** / Every 1 to 2 years.  Lipid and cholesterol check.** / Every 5 years beginning at age 69.  Hepatitis C blood test.** / For any individual with known risks for  hepatitis C.  Skin self-exam. / Monthly.  Influenza vaccine. / Every year.  Tetanus, diphtheria, and acellular pertussis (Tdap, Td) vaccine.** / Consult your health care provider. 1 dose of Td every 10 years.  Varicella vaccine.** / Consult your health care provider.  HPV vaccine. / 3 doses over 6 months, if 72 or younger.  Measles, mumps, rubella (MMR) vaccine.** / You need at least 1 dose of MMR if you were born in 1957 or later. You may also need a second dose.  Pneumococcal 13-valent conjugate (PCV13) vaccine.** / Consult your health care provider.  Pneumococcal polysaccharide (PPSV23) vaccine.** / 1 to 2 doses if you smoke cigarettes or if you have certain conditions.  Meningococcal vaccine.** / 1 dose if you are age 35 to 60 years and a Market researcher living in a residence hall, or have one of several medical conditions. You may also need additional booster doses.  Hepatitis A vaccine.** / Consult your health care provider.  Hepatitis B vaccine.** / Consult your health care provider.  Haemophilus influenzae type b (Hib) vaccine.** / Consult your health care provider. Ages 35 to 8  Blood pressure check.** / Every 1 to 2 years.  Lipid and cholesterol check.** / Every 5 years beginning at age 57.  Lung cancer screening. / Every year if you are aged 44-80 years and have a 30-pack-year history of smoking and currently smoke or have quit within the past 15 years. Yearly screening is stopped once you have quit smoking for at least 15 years or develop a health problem that would prevent you from having lung cancer treatment.  Fecal occult blood test (FOBT) of stool. / Every year beginning at age 55 and continuing until age 73. You may not have to do this test if you get a colonoscopy every 10 years.  Flexible sigmoidoscopy** or colonoscopy.** / Every 5 years for a flexible sigmoidoscopy or every 10 years for a colonoscopy beginning at age 28 and continuing until age  1.  Hepatitis C blood test.** / For all people born from [birth year] through 1965 and any individual with known risks for hepatitis C.  Skin self-exam. / Monthly.  Influenza vaccine. / Every  year.  Tetanus, diphtheria, and acellular pertussis (Tdap/Td) vaccine.** / Consult your health care provider. 1 dose of Td every 10 years.  Varicella vaccine.** / Consult your health care provider.  Zoster vaccine.** / 1 dose for adults aged 53 years or older.  Measles, mumps, rubella (MMR) vaccine.** / You need at least 1 dose of MMR if you were born in 1957 or later. You may also need a second dose.  Pneumococcal 13-valent conjugate (PCV13) vaccine.** / Consult your health care provider.  Pneumococcal polysaccharide (PPSV23) vaccine.** / 1 to 2 doses if you smoke cigarettes or if you have certain conditions.  Meningococcal vaccine.** / Consult your health care provider.  Hepatitis A vaccine.** / Consult your health care provider.  Hepatitis B vaccine.** / Consult your health care provider.  Haemophilus influenzae type b (Hib) vaccine.** / Consult your health care provider. Ages 77 and over  Blood pressure check.** / Every 1 to 2 years.  Lipid and cholesterol check.**/ Every 5 years beginning at age 85.  Lung cancer screening. / Every year if you are aged 55-80 years and have a 30-pack-year history of smoking and currently smoke or have quit within the past 15 years. Yearly screening is stopped once you have quit smoking for at least 15 years or develop a health problem that would prevent you from having lung cancer treatment.  Fecal occult blood test (FOBT) of stool. / Every year beginning at age 33 and continuing until age 11. You may not have to do this test if you get a colonoscopy every 10 years.  Flexible sigmoidoscopy** or colonoscopy.** / Every 5 years for a flexible sigmoidoscopy or every 10 years for a colonoscopy beginning at age 28 and continuing until age 73.  Hepatitis C blood  test.** / For all people born from [birth year] through 1965 and any individual with known risks for hepatitis C.  Abdominal aortic aneurysm (AAA) screening.** / A one-time screening for ages 50 to 27 years who are current or former smokers.  Skin self-exam. / Monthly.  Influenza vaccine. / Every year.  Tetanus, diphtheria, and acellular pertussis (Tdap/Td) vaccine.** / 1 dose of Td every 10 years.  Varicella vaccine.** / Consult your health care provider.  Zoster vaccine.** / 1 dose for adults aged 34 years or older.  Pneumococcal 13-valent conjugate (PCV13) vaccine.** / Consult your health care provider.  Pneumococcal polysaccharide (PPSV23) vaccine.** / 1 dose for all adults aged 63 years and older.  Meningococcal vaccine.** / Consult your health care provider.  Hepatitis A vaccine.** / Consult your health care provider.  Hepatitis B vaccine.** / Consult your health care provider.  Haemophilus influenzae type b (Hib) vaccine.** / Consult your health care provider. **Family history and personal history of risk and conditions may change your health care provider's recommendations. Document Released: 04/25/2001 Document Revised: 03/04/2013 Document Reviewed: 07/25/2010 [facility] Patient Information 2015 [place], [place]. This information is not intended to replace advice given to you by your health care provider. Make sure you discuss any questions you have with your health care provider.

## 2014-08-21 NOTE — Progress Notes (Signed)
Subjective:   Travis Daniels is a 69 y.o. male who presents for Medicare Annual/Subsequent preventive examination.  HPI HYPERTENSION  Blood pressure range-130/60s  Chest pain- no      Dyspnea- no Lightheadedness- no   Edema- no Other side effects - no   Medication compliance: good Low salt diet- yes  DIABETES  Blood Sugar ranges-80s-188  Polyuria- no New Visual problems- no Hypoglycemic symptoms- no Other side effects-no Medication compliance - good Last eye exam- 06/2014 Foot exam- today  HYPERLIPIDEMIA  Medication compliance- good RUQ pain- no  Muscle aches- no Other side effects-no   Review of Systems:   Review of Systems  Constitutional: Negative for activity change, appetite change and fatigue.  HENT: Negative for hearing loss, congestion, tinnitus and ear discharge.   Eyes: Negative for visual disturbance (see optho q1y -- vision corrected to 20/20 with glasses).  Respiratory: Negative for cough, chest tightness and shortness of breath.   Cardiovascular: Negative for chest pain, palpitations and leg swelling.  Gastrointestinal: Negative for abdominal pain, diarrhea, constipation and abdominal distention.  Genitourinary: Negative for urgency, frequency, decreased urine volume and difficulty urinating.  Musculoskeletal: Negative for back pain, arthralgias and gait problem.  Skin: Negative for color change, pallor and rash.  Neurological: Negative for dizziness, light-headedness, numbness and headaches.  Hematological: Negative for adenopathy. Does not bruise/bleed easily.  Psychiatric/Behavioral: Negative for suicidal ideas, confusion, sleep disturbance, self-injury, dysphoric mood, decreased concentration and agitation.  Pt is able to read and write and can do all ADLs No risk for falling No abuse/ violence in home           Objective:    Vitals: BP 148/72 mmHg  Pulse 58  Temp(Src) 97.9 F (36.6 C) (Oral)  Ht 6\' 1"  (1.854 m)  Wt 309 lb 9.6 oz  (140.434 kg)  BMI 40.86 kg/m2  SpO2 96% BP 148/72 mmHg  Pulse 58  Temp(Src) 97.9 F (36.6 C) (Oral)  Ht 6\' 1"  (1.854 m)  Wt 309 lb 9.6 oz (140.434 kg)  BMI 40.86 kg/m2  SpO2 96% General appearance: alert, cooperative, appears stated age and no distress Head: Normocephalic, without obvious abnormality, atraumatic Eyes: conjunctivae/corneas clear. PERRL, EOM's intact. Fundi benign. Ears: normal TM's and external ear canals both ears Nose: Nares normal. Septum midline. Mucosa normal. No drainage or sinus tenderness. Throat: lips, mucosa, and tongue normal; teeth and gums normal Neck: no adenopathy, no carotid bruit, no JVD, supple, symmetrical, trachea midline and thyroid not enlarged, symmetric, no tenderness/mass/nodules Back: symmetric, no curvature. ROM normal. No CVA tenderness. Lungs: clear to auscultation bilaterally Chest wall: no tenderness Heart: regular rate and rhythm, S1, S2 normal, no murmur, click, rub or gallop Abdomen: abnormal findings:  umbilical hernia Male genitalia: deferred--urology Rectal: deferred- urology Extremities: extremities normal, atraumatic, no cyanosis or edema Pulses: 2+ and symmetric Skin: Skin color, texture, turgor normal. No rashes or lesions Lymph nodes: Cervical, supraclavicular, and axillary nodes normal. Neurologic: Alert and oriented X 3, normal strength and tone. Normal symmetric reflexes. Normal coordination and gait Psych--no depression, no anxiety  Tobacco History  Smoking status  . Never Smoker   Smokeless tobacco  . Never Used    Comment: occ alcohol     Counseling given: Not Answered   Past Medical History  Diagnosis Date  . Hyperlipidemia   . Hypertension   . 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 B12 deficiency   . Lower extremity edema   . COPD (  chronic obstructive pulmonary disease) 01/2011    FVC 47%, FEV1 49%   . Arthritis   . Secondary pulmonary hypertension 01/2011    RV syst pressure 30-40  mm Hg on ECHO    Past Surgical History  Procedure Laterality Date  . Finger surgery Right 11    rt middle finger  . Vasectomy    . Total shoulder arthroplasty Right 01/21/2013    Procedure: SHOULDER HEMI ARTHROPLASTY;  Surgeon: Garald Balding, MD;  Location: [facility];  Service: Orthopedics;  Laterality: Right;  . 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 Ronelle Nigh at [facility] in [place], [place])  . Transthoracic echocardiogram  01/2011    EF 55-60%, mild asymmetric LVH, elevated RV systolic pressure   Family History  Problem Relation Age of Onset  . Cancer Father     colon and pancreas  . Epilepsy Son   . CAD Mother     MI in her 78s   History  Sexual Activity  . Sexual Activity: Yes    Outpatient Encounter Prescriptions as of 08/21/2014  Medication Sig  . albuterol (PROVENTIL HFA;VENTOLIN HFA) 108 (90 BASE) MCG/ACT inhaler Inhale 2 puffs into the lungs every 6 (six) hours as needed for wheezing or shortness of breath.  . Ascorbic Acid (VITAMIN C) 1000 MG tablet Take 1,000 mg by mouth daily.  Marland Kitchen aspirin 81 MG tablet Take 81 mg by mouth daily.  . betamethasone dipropionate (DIPROLENE) 0.05 % cream Apply topically 2 (two) times daily.  . Cholecalciferol (VITAMIN D-3) 1000 UNITS CAPS Take 1,000 Units by mouth.  . diltiazem (CARDIZEM CD) 300 MG 24 hr capsule Take 1 capsule (300 mg total) by mouth daily.  . Garlic (GARLIQUE PO) Take 1 tablet by mouth daily.  . Glucosamine-Chondroit-Vit C-Mn (GLUCOSAMINE 1500 COMPLEX PO) Take 1,500 mg by mouth daily.  Marland Kitchen glucose blood (ONE TOUCH ULTRA TEST) test strip Check blood sugar twice daily  . naproxen sodium (ANAPROX) 220 MG tablet Take 220 mg by mouth 2 (two) times daily with a meal.  . nystatin (MYCOSTATIN) powder Apply topically 4 (four) times daily.  Glory Rosebush DELICA LANCETS FINE MISC Check blood sugar daily  . tiotropium (SPIRIVA) 18 MCG  inhalation capsule Place 18 mcg into inhaler and inhale daily.  . vitamin B-12 (CYANOCOBALAMIN) 1000 MCG tablet Take 1,000 mcg by mouth daily.  . [DISCONTINUED] lisinopril (PRINIVIL,ZESTRIL) 40 MG tablet Take 40 mg by mouth daily.  . [DISCONTINUED] valsartan (DIOVAN) 160 MG tablet TAKE 1 TABLET (160 MG TOTAL) BY MOUTH DAILY.  . valsartan (DIOVAN) 320 MG tablet Take 1 tablet (320 mg total) by mouth daily.  . [DISCONTINUED] Efinaconazole (JUBLIA) 10 % SOLN Apply qd x 48 weeks (Patient not taking: Reported on 08/20/2014)  . [DISCONTINUED] furosemide (LASIX) 20 MG tablet 1 tablet (20 mg) by mouth daily. (Patient not taking: Reported on 08/20/2014)  . [DISCONTINUED] tolnaftate (TINACTIN) 1 % external solution Apply 1 application topically at bedtime. (Patient not taking: Reported on 08/20/2014)  . [DISCONTINUED] Umeclidinium-Vilanterol 62.5-25 MCG/INH AEPB Inhale 1 puff into the lungs daily. (Patient not taking: Reported on 08/20/2014)   No facility-administered encounter medications on file as of 08/21/2014.    Activities of Daily Living In your present state of health, do you have any difficulty performing the following activities: 08/21/2014 05/04/2014  Hearing? N N  Vision? N N  Difficulty concentrating or making decisions? N N  Walking or climbing stairs?  N N  Dressing or bathing? N N  Doing errands, shopping? N N    Patient Care Team: Rosalita Chessman, DO as PCP - General (Family Medicine)   Assessment:    CPE Exercise Activities and Dietary recommendations    Goals    None     Fall Risk Fall Risk  08/21/2014 11/21/2013 09/30/2012  Falls in the past year? No Yes No  Number falls in past yr: - 1 -  Injury with Fall? - No -   Depression Screen PHQ 2/9 Scores 08/21/2014 11/21/2013 09/30/2012  PHQ - 2 Score 0 0 0    Cognitive Testing AAOx3 NAD, no anxiety, no depression  Immunization History  Administered Date(s) Administered  . Influenza Split 11/11/2012, 12/11/2012  .  Influenza,inj,Quad PF,36+ Mos 11/21/2013  . Pneumococcal Conjugate-13 11/12/2011  . Pneumococcal Polysaccharide-23 09/30/2012  . Tdap 03/25/2013  . Zoster 10/01/2012   Screening Tests Health Maintenance  Topic Date Due  . INFLUENZA VACCINE  10/12/2014  . HEMOGLOBIN A1C  11/05/2014  . FOOT EXAM  05/05/2015  . URINE MICROALBUMIN  05/08/2015  . OPHTHALMOLOGY EXAM  06/21/2015  . COLONOSCOPY  04/13/2022  . TETANUS/TDAP  03/26/2023  . ZOSTAVAX  Completed  . PNA vac Low Risk Adult  Completed      Plan:    During the course of the visit the patient was educated and counseled about the following appropriate screening and preventive services:   Vaccines to include Pneumoccal, Influenza, Hepatitis B, Td, Zostavax, HCV  Electrocardiogram  Cardiovascular Disease  Colorectal cancer screening  Diabetes screening  Prostate Cancer Screening  Glaucoma screening  Nutrition counseling   Smoking cessation counseling  Patient Instructions (the written plan) was given to the patient.   1. Essential hypertension stable - valsartan (DIOVAN) 320 MG tablet; Take 1 tablet (320 mg total) by mouth daily.  Dispense: 90 tablet; Refill: 3 - Basic metabolic panel - CBC with Differential/Platelet - Microalbumin / creatinine urine ratio - POCT urinalysis dipstick D/c lisinopril-- should not be on ace/ arb together  2. Medicare annual wellness visit, subsequent See AVS and above  3. Type 2 diabetes mellitus with diabetic nephropathy Check labs,  Diet controlled - Basic metabolic panel - Hemoglobin A1c - Microalbumin / creatinine urine ratio - POCT urinalysis dipstick  4. Hyperlipidemia LDL goal <70 Check labs - Hepatic function panel - Lipid panel - Microalbumin / creatinine urine ratio - POCT urinalysis dipstick  5. Hematuria  - PSA  6. Routine history and physical examination of adult   7. Abnormal urine  - Urine Culture  Garnet Koyanagi, DO  08/21/2014

## 2014-08-21 NOTE — Progress Notes (Signed)
Pre visit review using our clinic review tool, if applicable. No additional management support is needed unless otherwise documented below in the visit note. 

## 2014-08-22 LAB — URINE CULTURE: Colony Count: 2000

## 2014-08-26 ENCOUNTER — Other Ambulatory Visit: Payer: Self-pay | Admitting: Family Medicine

## 2014-08-26 DIAGNOSIS — R972 Elevated prostate specific antigen [PSA]: Secondary | ICD-10-CM

## 2014-09-01 ENCOUNTER — Encounter: Payer: Self-pay | Admitting: Family Medicine

## 2014-10-26 DIAGNOSIS — R972 Elevated prostate specific antigen [PSA]: Secondary | ICD-10-CM | POA: Diagnosis not present

## 2014-10-26 DIAGNOSIS — N281 Cyst of kidney, acquired: Secondary | ICD-10-CM | POA: Diagnosis not present

## 2014-10-26 DIAGNOSIS — R312 Other microscopic hematuria: Secondary | ICD-10-CM | POA: Diagnosis not present

## 2014-10-28 DIAGNOSIS — N183 Chronic kidney disease, stage 3 (moderate): Secondary | ICD-10-CM | POA: Diagnosis not present

## 2014-10-29 DIAGNOSIS — N183 Chronic kidney disease, stage 3 (moderate): Secondary | ICD-10-CM | POA: Diagnosis not present

## 2014-10-29 DIAGNOSIS — E1129 Type 2 diabetes mellitus with other diabetic kidney complication: Secondary | ICD-10-CM | POA: Diagnosis not present

## 2014-10-29 DIAGNOSIS — I10 Essential (primary) hypertension: Secondary | ICD-10-CM | POA: Diagnosis not present

## 2014-10-29 DIAGNOSIS — R809 Proteinuria, unspecified: Secondary | ICD-10-CM | POA: Diagnosis not present

## 2014-11-02 ENCOUNTER — Ambulatory Visit (INDEPENDENT_AMBULATORY_CARE_PROVIDER_SITE_OTHER): Payer: Medicare Other | Admitting: Family Medicine

## 2014-11-02 ENCOUNTER — Encounter: Payer: Self-pay | Admitting: Family Medicine

## 2014-11-02 VITALS — BP 150/74 | HR 52 | Temp 98.3°F | Ht 73.0 in | Wt 301.8 lb

## 2014-11-02 DIAGNOSIS — I10 Essential (primary) hypertension: Secondary | ICD-10-CM

## 2014-11-02 DIAGNOSIS — IMO0002 Reserved for concepts with insufficient information to code with codable children: Secondary | ICD-10-CM

## 2014-11-02 DIAGNOSIS — E1165 Type 2 diabetes mellitus with hyperglycemia: Secondary | ICD-10-CM | POA: Diagnosis not present

## 2014-11-02 NOTE — Progress Notes (Signed)
Pre visit review using our clinic review tool, if applicable. No additional management support is needed unless otherwise documented below in the visit note. 

## 2014-11-02 NOTE — Patient Instructions (Signed)

## 2014-11-02 NOTE — Progress Notes (Signed)
[name], male   DOB: 1945-09-15, 69 y.o.   MRN: [id number]   Subjective:    [name], male    DOB: [birth year], 69 y.o.   MRN: [id number]  Chief Complaint  Patient presents with  . Diabetes    6 mo f/u--Fasting  . Hypertension    HPI Patient is in today for f/u dm, htn and cholesterol.  No complaints.   HPI HYPERTENSION  Blood pressure range--- running high  Chest pain- no      Dyspnea- no Lightheadedness- no   Edema- yes--improved Other side effects - no   Medication compliance: no Low salt diet- yes  DIABETES  Blood Sugar ranges-80s-- 200s  Polyuria- no New Visual problems- no Hypoglycemic symptoms- no Other side effects-no Medication compliance - good Last eye exam- 2-3 months ago Foot exam- today   HYPERLIPIDEMIA  Medication compliance- good RUQ pain- no  Muscle aches- no Other side effects-no   Past Medical History  Diagnosis Date  . Hyperlipidemia   . Hypertension   . 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 B12 deficiency   . Lower extremity edema   . COPD (chronic obstructive pulmonary disease) 01/2011    FVC 47%, FEV1 49%   . Arthritis   . Secondary pulmonary hypertension 01/2011    RV syst pressure 30-40 mm Hg on ECHO     Past Surgical History  Procedure Laterality Date  . Finger surgery Right 11    rt middle finger  . Vasectomy    . Total shoulder arthroplasty Right 01/21/2013    Procedure: SHOULDER HEMI ARTHROPLASTY;  Surgeon: Garald Balding, MD;  Location: [facility];  Service: Orthopedics;  Laterality: Right;  . 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 Ronelle [facility] at [facility] in [place], [place])  . Transthoracic echocardiogram  01/2011    EF 55-60%, mild asymmetric LVH, elevated RV systolic pressure    Family History  Problem Relation Age of Onset  . Cancer Father     colon and pancreas  .  Epilepsy Son   . CAD Mother     MI in her 3s    Social History   Social History  . Marital Status: Married    Spouse Name: N/A  . Number of Children: 4  . Years of Education: N/A   Occupational History  . retired    Social History Main Topics  . Smoking status: Never Smoker   . Smokeless tobacco: Never Used     Comment: occ alcohol  . Alcohol Use: 0.0 oz/week    0 Standard drinks or equivalent per week     Comment: 2-3 drinks occasionally  . Drug Use: No  . Sexual Activity: Yes   Other Topics Concern  . Not on file   Social History Narrative   Mr. Spraggins lives with his wife in [place]. He is recently retired and relocated to [facility] from [place]. Historically, his medical provider has been [facility] in [place].    Outpatient Prescriptions Prior to Visit  Medication Sig Dispense Refill  . albuterol (PROVENTIL HFA;VENTOLIN HFA) 108 (90 BASE) MCG/ACT inhaler Inhale 2 puffs into the lungs every 6 (six) hours as needed for wheezing or shortness of breath.    Marland Kitchen aspirin 81 MG tablet Take 81 mg by mouth daily.    . betamethasone dipropionate (DIPROLENE) 0.05 %  cream Apply topically 2 (two) times daily. 30 g 1  . Cholecalciferol (VITAMIN D-3) 1000 UNITS CAPS Take 1,000 Units by mouth.    . diltiazem (CARDIZEM CD) 300 MG 24 hr capsule Take 1 capsule (300 mg total) by mouth daily. 90 capsule 3  . Garlic (GARLIQUE PO) Take 1 tablet by mouth daily.    . Glucosamine-Chondroit-Vit C-Mn (GLUCOSAMINE 1500 COMPLEX PO) Take 1,500 mg by mouth daily.    Marland Kitchen glucose blood (ONE TOUCH ULTRA TEST) test strip Check blood sugar twice daily 100 each 12  . naproxen sodium (ANAPROX) 220 MG tablet Take 220 mg by mouth 2 (two) times daily with a meal.    . nystatin (MYCOSTATIN) powder Apply topically 4 (four) times daily. 15 g 1  . ONETOUCH DELICA LANCETS FINE MISC Check blood sugar daily 100 each 12  . tiotropium (SPIRIVA) 18 MCG inhalation capsule Place 18 mcg into inhaler and inhale daily.    . Ascorbic Acid  (VITAMIN C) 1000 MG tablet Take 1,000 mg by mouth daily.    . vitamin B-12 (CYANOCOBALAMIN) 1000 MCG tablet Take 1,000 mcg by mouth daily.    . valsartan (DIOVAN) 320 MG tablet Take 1 tablet (320 mg total) by mouth daily. 90 tablet 3   No facility-administered medications prior to visit.    No Known Allergies  Review of Systems  Constitutional: Negative for fever and malaise/fatigue.  HENT: Negative for congestion.   Eyes: Negative for discharge.  Respiratory: Negative for shortness of breath.   Cardiovascular: Negative for chest pain, palpitations and leg swelling.  Gastrointestinal: Negative for nausea and abdominal pain.  Genitourinary: Negative for dysuria.  Musculoskeletal: Negative for falls.  Skin: Negative for rash.  Neurological: Negative for loss of consciousness and headaches.  Endo/Heme/Allergies: Negative for environmental allergies.  Psychiatric/Behavioral: Negative for depression. The patient is not nervous/anxious.        Objective:    Physical Exam  Constitutional: He is oriented to person, place, and time. Vital signs are normal. He appears well-developed and well-nourished. He is sleeping.  HENT:  Head: Normocephalic and atraumatic.  Mouth/Throat: Oropharynx is clear and moist.  Eyes: EOM are normal. Pupils are equal, round, and reactive to light.  Neck: Normal range of motion. Neck supple. No thyromegaly present.  Cardiovascular: Normal rate and regular rhythm.   No murmur heard. Pulmonary/Chest: Effort normal and breath sounds normal. No respiratory distress. He has no wheezes. He has no rales. He exhibits no tenderness.  Musculoskeletal: He exhibits no edema or tenderness.  Neurological: He is alert and oriented to person, place, and time.  Skin: Skin is warm and dry.  Psychiatric: He has a normal mood and affect. His behavior is normal. Judgment and thought content normal.     BP 150/74 mmHg  Pulse 52  Temp(Src) 98.3 F (36.8 C) (Oral)  Ht 6\' 1"   (1.854 m)  Wt 301 lb 12.8 oz (136.896 kg)  BMI 39.83 kg/m2  SpO2 97% Wt Readings from Last 3 Encounters:  11/02/14 301 lb 12.8 oz (136.896 kg)  08/21/14 309 lb 9.6 oz (140.434 kg)  05/04/14 306 lb 6.4 oz (138.982 kg)     Lab Results  Component Value Date   WBC 6.5 08/21/2014   HGB 14.6 08/21/2014   HCT 43.6 08/21/2014   PLT 217.0 08/21/2014   GLUCOSE 93 08/21/2014   CHOL 155 08/21/2014   TRIG 72.0 08/21/2014   HDL 52.50 08/21/2014   LDLCALC 88 08/21/2014   ALT 15 08/21/2014  AST 15 08/21/2014   NA 141 08/21/2014   K 4.0 08/21/2014   CL 103 08/21/2014   CREATININE 1.39 08/21/2014   BUN 21 08/21/2014   CO2 32 08/21/2014   TSH 0.746 09/09/2012   PSA 5.18* 08/21/2014   INR 0.95 01/14/2013   HGBA1C 5.0 08/21/2014   MICROALBUR 36.9* 08/21/2014    Lab Results  Component Value Date   TSH 0.746 09/09/2012   Lab Results  Component Value Date   WBC 6.5 08/21/2014   HGB 14.6 08/21/2014   HCT 43.6 08/21/2014   MCV 85.8 08/21/2014   PLT 217.0 08/21/2014   Lab Results  Component Value Date   NA 141 08/21/2014   K 4.0 08/21/2014   CO2 32 08/21/2014   GLUCOSE 93 08/21/2014   BUN 21 08/21/2014   CREATININE 1.39 08/21/2014   BILITOT 0.8 08/21/2014   ALKPHOS 86 08/21/2014   AST 15 08/21/2014   ALT 15 08/21/2014   PROT 6.5 08/21/2014   ALBUMIN 4.0 08/21/2014   CALCIUM 9.6 08/21/2014   GFR 53.80* 08/21/2014   Lab Results  Component Value Date   CHOL 155 08/21/2014   Lab Results  Component Value Date   HDL 52.50 08/21/2014   Lab Results  Component Value Date   LDLCALC 88 08/21/2014   Lab Results  Component Value Date   TRIG 72.0 08/21/2014   Lab Results  Component Value Date   CHOLHDL 3 08/21/2014   Lab Results  Component Value Date   HGBA1C 5.0 08/21/2014       Assessment & Plan:   Problem List Items Addressed This Visit    Obesity, Class III, BMI 40-49.9 (morbid obesity) (Chronic)   Essential hypertension, benign - Primary (Chronic)    Relevant Medications   lisinopril (PRINIVIL,ZESTRIL) 40 MG tablet   valsartan (DIOVAN) 160 MG tablet   Diabetes mellitus type II, uncontrolled   Relevant Medications   lisinopril (PRINIVIL,ZESTRIL) 40 MG tablet   valsartan (DIOVAN) 160 MG tablet      I have discontinued Mr. Hattabaugh vitamin B-12 and vitamin C. I am also having him maintain his aspirin, Vitamin D-3, naproxen sodium, albuterol, ONETOUCH DELICA LANCETS FINE, glucose blood, diltiazem, betamethasone dipropionate, nystatin, tiotropium, Garlic (GARLIQUE PO), Glucosamine-Chondroit-Vit C-Mn (GLUCOSAMINE 1500 COMPLEX PO), lisinopril, and valsartan.  Meds ordered this encounter  Medications  . lisinopril (PRINIVIL,ZESTRIL) 40 MG tablet    Sig: Take 1 tablet by mouth daily.  . valsartan (DIOVAN) 160 MG tablet    Sig: Take 160 mg by mouth 2 (two) times daily.     Garnet Koyanagi, DO

## 2014-11-23 DIAGNOSIS — R972 Elevated prostate specific antigen [PSA]: Secondary | ICD-10-CM | POA: Diagnosis not present

## 2014-11-23 DIAGNOSIS — N281 Cyst of kidney, acquired: Secondary | ICD-10-CM | POA: Diagnosis not present

## 2014-11-23 DIAGNOSIS — R312 Other microscopic hematuria: Secondary | ICD-10-CM | POA: Diagnosis not present

## 2014-12-03 ENCOUNTER — Other Ambulatory Visit (INDEPENDENT_AMBULATORY_CARE_PROVIDER_SITE_OTHER): Payer: Medicare Other

## 2014-12-03 ENCOUNTER — Ambulatory Visit (INDEPENDENT_AMBULATORY_CARE_PROVIDER_SITE_OTHER): Payer: Medicare Other

## 2014-12-03 ENCOUNTER — Other Ambulatory Visit: Payer: Self-pay

## 2014-12-03 DIAGNOSIS — Z1159 Encounter for screening for other viral diseases: Secondary | ICD-10-CM | POA: Diagnosis not present

## 2014-12-03 DIAGNOSIS — Z23 Encounter for immunization: Secondary | ICD-10-CM

## 2014-12-04 LAB — HEPATITIS C ANTIBODY: HCV AB: NEGATIVE

## 2014-12-10 ENCOUNTER — Ambulatory Visit: Payer: Medicare Other | Admitting: Pulmonary Disease

## 2014-12-10 ENCOUNTER — Encounter: Payer: Self-pay | Admitting: Pulmonary Disease

## 2014-12-10 ENCOUNTER — Ambulatory Visit (INDEPENDENT_AMBULATORY_CARE_PROVIDER_SITE_OTHER): Payer: Medicare Other | Admitting: Pulmonary Disease

## 2014-12-10 VITALS — BP 160/72 | HR 58 | Temp 98.4°F | Ht 73.0 in | Wt 296.0 lb

## 2014-12-10 DIAGNOSIS — G4733 Obstructive sleep apnea (adult) (pediatric): Secondary | ICD-10-CM

## 2014-12-10 NOTE — Patient Instructions (Signed)
Follow up in 1 year.

## 2014-12-10 NOTE — Progress Notes (Signed)
Chief Complaint  Patient presents with  . Follow-up    Former [name] pt. pt states he is doing well. pt using CPAP every night  for about 5 - 6 hours. pressure and mask good for pt. no concerns at this time. DME:  [employer]. no recent download available     History of Present Illness: Travis Daniels is a 69 y.o. male with OSA.  He was previously seen by Dr. Gwenette Daniels.  He is a English as a second language teacher.  He has full face mask.  He gets occasional mask leak, and dryness in his mouth.  He replace CPAP mask liner about every month.  He gets about 6 to 7 hours sleep per night.   TESTS: PSG 04/10/13 >> AHI 30, SaO2 low 74% Auto CPAP 05/22/14 to 06/20/14 >> used on 30 of 30 nights with average 6 hrs and 36 min.  Average AHI is 2.2 with median CPAP 16 cm H2O and 95 th percentile CPAP 19 cm H20.   PMhx >> HTN, HLD, COPD, secondary pulmonary HTN  Past surgical hx, Medications, Allergies, Family hx, Social hx all reviewed.   Physical Exam: BP 160/72 mmHg  Pulse 58  Temp(Src) 98.4 F (36.9 C) (Oral)  Ht 6\' 1"  (1.854 m)  Wt 296 lb (134.265 kg)  BMI 39.06 kg/m2  SpO2 95%  General - No distress ENT - No sinus tenderness, no oral exudate, no LAN, MP 3 Cardiac - s1s2 regular, no murmur Chest - No wheeze/rales/dullness Back - No focal tenderness Abd - Soft, non-tender Ext - No edema Neuro - Normal strength Skin - No rashes Psych - normal mood, and behavior   Assessment/Plan:  Obstructive sleep apnea. He is compliant with therapy and reports benefit from CPAP. Plan: - continue auto CPAP - advised him to check if he is eligible for CPAP supplies through the [place] - advised he could try biotene or adjust humidifier to help with mouth dryness  Obesity. Plan: - discussed importance of weight loss  HTN. Plan: - he is to f/u with his PCP   Chesley Mires, MD [facility] Pager:  [phone]

## 2015-01-17 ENCOUNTER — Other Ambulatory Visit: Payer: Self-pay | Admitting: Family Medicine

## 2015-02-25 ENCOUNTER — Telehealth: Payer: Self-pay

## 2015-02-26 ENCOUNTER — Ambulatory Visit: Payer: Medicare Other | Admitting: Family Medicine

## 2015-02-26 ENCOUNTER — Encounter: Payer: Self-pay | Admitting: Family Medicine

## 2015-02-26 ENCOUNTER — Ambulatory Visit (INDEPENDENT_AMBULATORY_CARE_PROVIDER_SITE_OTHER): Payer: Medicare Other | Admitting: Family Medicine

## 2015-02-26 VITALS — BP 165/97 | HR 54 | Temp 98.0°F | Ht 73.0 in | Wt 302.4 lb

## 2015-02-26 DIAGNOSIS — R319 Hematuria, unspecified: Secondary | ICD-10-CM

## 2015-02-26 DIAGNOSIS — I10 Essential (primary) hypertension: Secondary | ICD-10-CM | POA: Diagnosis not present

## 2015-02-26 DIAGNOSIS — R972 Elevated prostate specific antigen [PSA]: Secondary | ICD-10-CM | POA: Diagnosis not present

## 2015-02-26 DIAGNOSIS — Z0000 Encounter for general adult medical examination without abnormal findings: Secondary | ICD-10-CM | POA: Diagnosis not present

## 2015-02-26 LAB — COMPREHENSIVE METABOLIC PANEL
ALBUMIN: 4.2 g/dL (ref 3.6–5.1)
ALK PHOS: 96 U/L (ref 40–115)
ALT: 20 U/L (ref 9–46)
AST: 19 U/L (ref 10–35)
BILIRUBIN TOTAL: 0.7 mg/dL (ref 0.2–1.2)
BUN: 16 mg/dL (ref 7–25)
CALCIUM: 9.5 mg/dL (ref 8.6–10.3)
CO2: 32 mmol/L — ABNORMAL HIGH (ref 20–31)
Chloride: 99 mmol/L (ref 98–110)
Creat: 1.25 mg/dL (ref 0.70–1.25)
Glucose, Bld: 94 mg/dL (ref 65–99)
Potassium: 4.1 mmol/L (ref 3.5–5.3)
Sodium: 139 mmol/L (ref 135–146)
TOTAL PROTEIN: 7 g/dL (ref 6.1–8.1)

## 2015-02-26 LAB — LIPID PANEL
CHOL/HDL RATIO: 2.3 ratio (ref <=5.0)
CHOLESTEROL: 146 mg/dL (ref 125–200)
HDL: 63 mg/dL (ref >=40)
LDL Cholesterol: 67 mg/dL (ref <130)
Triglycerides: 82 mg/dL (ref <150)
VLDL: 16 mg/dL (ref <30)

## 2015-02-26 LAB — CBC WITH DIFFERENTIAL/PLATELET
BASOS ABS: 0.1 10*3/uL (ref 0.0–0.1)
BASOS PCT: 1 % (ref 0–1)
Eosinophils Absolute: 0.2 10*3/uL (ref 0.0–0.7)
Eosinophils Relative: 2 % (ref 0–5)
HCT: 47.1 % (ref 39.0–52.0)
HEMOGLOBIN: 15.8 g/dL (ref 13.0–17.0)
LYMPHS ABS: 2.1 10*3/uL (ref 0.7–4.0)
Lymphocytes Relative: 23 % (ref 12–46)
MCH: 28.3 pg (ref 26.0–34.0)
MCHC: 33.5 g/dL (ref 30.0–36.0)
MCV: 84.3 fL (ref 78.0–100.0)
MONOS PCT: 9 % (ref 3–12)
MPV: 10.2 fL (ref 8.6–12.4)
Monocytes Absolute: 0.8 10*3/uL (ref 0.1–1.0)
NEUTROS ABS: 6 10*3/uL (ref 1.7–7.7)
NEUTROS PCT: 65 % (ref 43–77)
PLATELETS: 244 10*3/uL (ref 150–400)
RBC: 5.59 MIL/uL (ref 4.22–5.81)
RDW: 13.8 % (ref 11.5–15.5)
WBC: 9.2 10*3/uL (ref 4.0–10.5)

## 2015-02-26 LAB — POCT URINALYSIS DIPSTICK
BILIRUBIN UA: NEGATIVE
Glucose, UA: NEGATIVE
KETONES UA: NEGATIVE
LEUKOCYTES UA: NEGATIVE
NITRITE UA: NEGATIVE
Spec Grav, UA: 1.025
Urobilinogen, UA: 0.2
pH, UA: 6

## 2015-02-26 MED ORDER — CHLORTHALIDONE 25 MG PO TABS: 25.0000 mg | ORAL_TABLET | Freq: Every day | ORAL | Status: DC

## 2015-02-26 NOTE — Progress Notes (Signed)
Subjective:   Travis Daniels is a 69 y.o. male who presents for Medicare Annual/Subsequent preventive examination.  Review of Systems:   Review of Systems  Constitutional: Negative for activity change, appetite change and fatigue.  HENT: Negative for hearing loss, congestion, tinnitus and ear discharge.   Eyes: Negative for visual disturbance (see optho q1y -- vision corrected to 20/20 with glasses).  Respiratory: Negative for cough, chest tightness and shortness of breath.   Cardiovascular: Negative for chest pain, palpitations and leg swelling.  Gastrointestinal: Negative for abdominal pain, diarrhea, constipation and abdominal distention.  Genitourinary: Negative for urgency, frequency, decreased urine volume and difficulty urinating.  Musculoskeletal: Negative for back pain, arthralgias and gait problem.  Skin: Negative for color change, pallor and rash.  Neurological: Negative for dizziness, light-headedness, numbness and headaches.  Hematological: Negative for adenopathy. Does not bruise/bleed easily.  Psychiatric/Behavioral: Negative for suicidal ideas, confusion, sleep disturbance, self-injury, dysphoric mood, decreased concentration and agitation.  Pt is able to read and write and can do all ADLs No risk for falling No abuse/ violence in home           Objective:    Vitals: BP 165/97 mmHg  Pulse 54  Temp(Src) 98 F (36.7 C) (Oral)  Ht 6' 1"  (1.854 m)  Wt 302 lb 6.4 oz (137.168 kg)  BMI 39.91 kg/m2  SpO2 96% BP 165/97 mmHg  Pulse 54  Temp(Src) 98 F (36.7 C) (Oral)  Ht 6' 1"  (1.854 m)  Wt 302 lb 6.4 oz (137.168 kg)  BMI 39.91 kg/m2  SpO2 96% General appearance: alert, cooperative, appears stated age and no distress Head: Normocephalic, without obvious abnormality, atraumatic Eyes: conjunctivae/corneas clear. PERRL, EOM's intact. Fundi benign. Ears: normal TM's and external ear canals both ears Nose: Nares normal. Septum midline. Mucosa normal. No  drainage or sinus tenderness. Throat: lips, mucosa, and tongue normal; teeth and gums normal Neck: no adenopathy, no carotid bruit, no JVD, supple, symmetrical, trachea midline and thyroid not enlarged, symmetric, no tenderness/mass/nodules Back: symmetric, no curvature. ROM normal. No CVA tenderness. Lungs: clear to auscultation bilaterally Chest wall: no tenderness Heart: regular rate and rhythm, S1, S2 normal, no murmur, click, rub or gallop Abdomen: soft, non-tender; bowel sounds normal; no masses,  no organomegaly Male genitalia: deferred---urology Rectal: deferred Extremities: extremities normal, atraumatic, no cyanosis or edema Pulses: 2+ and symmetric Skin: Skin color, texture, turgor normal. No rashes or lesions Lymph nodes: Cervical, supraclavicular, and axillary nodes normal. Neurologic: Alert and oriented X 3, normal strength and tone. Normal symmetric reflexes. Normal coordination and gait Psych-- no depression, no anxiety Tobacco History  Smoking status  . Never Smoker   Smokeless tobacco  . Never Used    Comment: occ alcohol     Counseling given: Not Answered   Past Medical History  Diagnosis Date  . Hyperlipidemia   . Hypertension   . 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 B12 deficiency   . Lower extremity edema   . COPD (chronic obstructive pulmonary disease) ([place]) 01/2011    FVC 47%, FEV1 49%   . Arthritis   . Secondary pulmonary hypertension ([place]) 01/2011    RV syst pressure 30-40 mm Hg on ECHO    Past Surgical History  Procedure Laterality Date  . Finger surgery Right 11    rt middle finger  . Vasectomy    . Total shoulder arthroplasty Right 01/21/2013    Procedure: SHOULDER HEMI ARTHROPLASTY;  Surgeon: Vonna Kotyk  Durward Fortes, MD;  Location: [facility];  Service: Orthopedics;  Laterality: Right;  . 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 Ronelle Nigh at  [facility] in [place], [place])  . Transthoracic echocardiogram  01/2011    EF 55-60%, mild asymmetric LVH, elevated RV systolic pressure   Family History  Problem Relation Age of Onset  . Cancer Father     colon and pancreas  . Epilepsy Son   . CAD Mother     MI in her 50s   History  Sexual Activity  . Sexual Activity: Yes    Outpatient Encounter Prescriptions as of 02/26/2015  Medication Sig  . albuterol (PROVENTIL HFA;VENTOLIN HFA) 108 (90 BASE) MCG/ACT inhaler Inhale 2 puffs into the lungs every 6 (six) hours as needed for wheezing or shortness of breath.  Marland Kitchen aspirin 81 MG tablet Take 81 mg by mouth daily.  . betamethasone dipropionate (DIPROLENE) 0.05 % cream Apply topically 2 (two) times daily.  . Cholecalciferol (VITAMIN D-3) 1000 UNITS CAPS Take 1,000 Units by mouth.  . diltiazem (CARDIZEM CD) 300 MG 24 hr capsule Take 1 capsule (300 mg total) by mouth daily.  . Garlic (GARLIQUE PO) Take 1 tablet by mouth daily.  . Glucosamine-Chondroit-Vit C-Mn (GLUCOSAMINE 1500 COMPLEX PO) Take 1,500 mg by mouth daily.  Marland Kitchen lisinopril (PRINIVIL,ZESTRIL) 40 MG tablet Take 1 tablet by mouth daily.  . naproxen sodium (ANAPROX) 220 MG tablet Take 220 mg by mouth 2 (two) times daily with a meal.  . nystatin (MYCOSTATIN) powder Apply topically 4 (four) times daily.  . ONE TOUCH ULTRA TEST test strip CHECK BLOOD SUGAR TWICE DAILY  . ONETOUCH DELICA LANCETS FINE MISC Check blood sugar daily  . tiotropium (SPIRIVA) 18 MCG inhalation capsule Place 18 mcg into inhaler and inhale daily.  . valsartan (DIOVAN) 160 MG tablet Take 160 mg by mouth 2 (two) times daily.   No facility-administered encounter medications on file as of 02/26/2015.    Activities of Daily Living In your present state of health, do you have any difficulty performing the following activities: 02/26/2015 08/21/2014  Hearing? N N  Vision? N N  Difficulty concentrating or making decisions? N N  Walking or climbing stairs? N N    Dressing or bathing? N N  Doing errands, shopping? N N    Patient Care Team: Rosalita Chessman, DO as PCP - General (Family Medicine)   Assessment:    CPE Exercise Activities and Dietary recommendations--- pt with no current exercise     Goals    None     Fall Risk Fall Risk  02/26/2015 08/21/2014 11/21/2013 09/30/2012  Falls in the past year? No No Yes No  Number falls in past yr: - - 1 -  Injury with Fall? - - No -   Depression Screen PHQ 2/9 Scores 02/26/2015 08/21/2014 11/21/2013 09/30/2012  PHQ - 2 Score 0 0 0 0    Cognitive Testing mmse 30/30  Immunization History  Administered Date(s) Administered  . Influenza Split 11/11/2012, 12/11/2012  . Influenza, High Dose Seasonal PF 12/03/2014  . Influenza,inj,Quad PF,36+ Mos 11/21/2013  . Pneumococcal Conjugate-13 11/12/2011  . Pneumococcal Polysaccharide-23 09/30/2012  . Tdap 03/25/2013  . Zoster 10/01/2012   Screening Tests Health Maintenance  Topic Date Due  . HEMOGLOBIN A1C  02/20/2015  . FOOT EXAM  05/05/2015  . OPHTHALMOLOGY EXAM  06/21/2015  . URINE MICROALBUMIN  08/21/2015  . INFLUENZA VACCINE  10/12/2015  . TETANUS/TDAP  03/26/2023  . COLONOSCOPY  09/21/2024  . ZOSTAVAX  Completed  . Hepatitis C Screening  Completed  . PNA vac Low Risk Adult  Completed      Plan:    During the course of the visit the patient was educated and counseled about the following appropriate screening and preventive services:   Vaccines to include Pneumoccal, Influenza, Hepatitis B, Td, Zostavax, HCV  Electrocardiogram  Cardiovascular Disease  Colorectal cancer screening  Diabetes screening  Prostate Cancer Screening  Glaucoma screening  Nutrition counseling   Smoking cessation counseling  Patient Instructions (the written plan) was given to the patient.   1. Essential hypertension Con' t diltiazam, lisinopril [facility] diovan [facility] - POCT urinalysis dipstick - chlorthalidone (HYGROTON) 25 MG tablet; Take 1  tablet (25 mg total) by mouth daily.  Dispense: 30 tablet; Refill: 5 - CBC w/Diff - Lipid panel - PSA - TSH - Comp Met (CMET) - Microalbumin / creatinine urine ratio  2. Elevated PSA Per [facility] - POCT urinalysis dipstick - CBC w/Diff - Lipid panel - PSA - TSH - Comp Met (CMET) - Microalbumin / creatinine urine ratio  3. Medicare annual wellness visit, subsequent   4. Routine history and physical examination of adult    5. Blood in urine   - Urine culture   Garnet Koyanagi, DO  02/26/2015

## 2015-02-26 NOTE — Patient Instructions (Signed)

## 2015-02-27 LAB — MICROALBUMIN / CREATININE URINE RATIO
CREATININE, URINE: 80 mg/dL (ref 20–370)
MICROALB UR: 69.1 mg/dL
MICROALB/CREAT RATIO: 864 ug/mg{creat} — AB (ref <30)

## 2015-02-27 LAB — URINE CULTURE
COLONY COUNT: NO GROWTH
ORGANISM ID, BACTERIA: NO GROWTH

## 2015-02-27 LAB — PSA: PSA: 4.96 ng/mL — ABNORMAL HIGH (ref <=4.00)

## 2015-02-27 LAB — TSH: TSH: 0.9 u[IU]/mL (ref 0.350–4.500)

## 2015-03-02 NOTE — Telephone Encounter (Signed)
Pre Visit call completed. 

## 2015-03-04 ENCOUNTER — Other Ambulatory Visit: Payer: Self-pay | Admitting: Family Medicine

## 2015-04-13 DIAGNOSIS — R3129 Other microscopic hematuria: Secondary | ICD-10-CM | POA: Diagnosis not present

## 2015-04-13 DIAGNOSIS — E1129 Type 2 diabetes mellitus with other diabetic kidney complication: Secondary | ICD-10-CM | POA: Diagnosis not present

## 2015-04-13 DIAGNOSIS — I10 Essential (primary) hypertension: Secondary | ICD-10-CM | POA: Diagnosis not present

## 2015-04-13 DIAGNOSIS — E785 Hyperlipidemia, unspecified: Secondary | ICD-10-CM | POA: Diagnosis not present

## 2015-04-13 DIAGNOSIS — R809 Proteinuria, unspecified: Secondary | ICD-10-CM | POA: Diagnosis not present

## 2015-04-13 DIAGNOSIS — N183 Chronic kidney disease, stage 3 (moderate): Secondary | ICD-10-CM | POA: Diagnosis not present

## 2015-05-18 DIAGNOSIS — R972 Elevated prostate specific antigen [PSA]: Secondary | ICD-10-CM | POA: Diagnosis not present

## 2015-05-25 DIAGNOSIS — N281 Cyst of kidney, acquired: Secondary | ICD-10-CM | POA: Diagnosis not present

## 2015-05-25 DIAGNOSIS — Z0000 Encounter for general adult medical examination without abnormal findings: Secondary | ICD-10-CM | POA: Diagnosis not present

## 2015-05-25 DIAGNOSIS — R3129 Other microscopic hematuria: Secondary | ICD-10-CM | POA: Diagnosis not present

## 2015-05-25 DIAGNOSIS — R972 Elevated prostate specific antigen [PSA]: Secondary | ICD-10-CM | POA: Diagnosis not present

## 2015-06-07 DIAGNOSIS — H5203 Hypermetropia, bilateral: Secondary | ICD-10-CM | POA: Diagnosis not present

## 2015-06-07 DIAGNOSIS — H35033 Hypertensive retinopathy, bilateral: Secondary | ICD-10-CM | POA: Diagnosis not present

## 2015-06-07 DIAGNOSIS — H524 Presbyopia: Secondary | ICD-10-CM | POA: Diagnosis not present

## 2015-06-07 DIAGNOSIS — H2513 Age-related nuclear cataract, bilateral: Secondary | ICD-10-CM | POA: Diagnosis not present

## 2015-06-07 DIAGNOSIS — E119 Type 2 diabetes mellitus without complications: Secondary | ICD-10-CM | POA: Diagnosis not present

## 2015-07-21 DIAGNOSIS — E785 Hyperlipidemia, unspecified: Secondary | ICD-10-CM | POA: Diagnosis not present

## 2015-07-21 DIAGNOSIS — I10 Essential (primary) hypertension: Secondary | ICD-10-CM | POA: Diagnosis not present

## 2015-07-21 DIAGNOSIS — E1129 Type 2 diabetes mellitus with other diabetic kidney complication: Secondary | ICD-10-CM | POA: Diagnosis not present

## 2015-07-21 DIAGNOSIS — N183 Chronic kidney disease, stage 3 (moderate): Secondary | ICD-10-CM | POA: Diagnosis not present

## 2015-07-21 DIAGNOSIS — R809 Proteinuria, unspecified: Secondary | ICD-10-CM | POA: Diagnosis not present

## 2015-07-21 DIAGNOSIS — R3129 Other microscopic hematuria: Secondary | ICD-10-CM | POA: Diagnosis not present

## 2015-08-27 ENCOUNTER — Ambulatory Visit (INDEPENDENT_AMBULATORY_CARE_PROVIDER_SITE_OTHER): Payer: Medicare Other | Admitting: Family Medicine

## 2015-08-27 ENCOUNTER — Encounter: Payer: Self-pay | Admitting: Family Medicine

## 2015-08-27 VITALS — BP 136/90 | HR 73 | Temp 99.1°F | Ht 73.0 in | Wt 320.2 lb

## 2015-08-27 DIAGNOSIS — R319 Hematuria, unspecified: Secondary | ICD-10-CM | POA: Diagnosis not present

## 2015-08-27 DIAGNOSIS — E785 Hyperlipidemia, unspecified: Secondary | ICD-10-CM | POA: Diagnosis not present

## 2015-08-27 DIAGNOSIS — I10 Essential (primary) hypertension: Secondary | ICD-10-CM

## 2015-08-27 DIAGNOSIS — E1151 Type 2 diabetes mellitus with diabetic peripheral angiopathy without gangrene: Secondary | ICD-10-CM

## 2015-08-27 LAB — CBC WITH DIFFERENTIAL/PLATELET
BASOS ABS: 0.1 10*3/uL (ref 0.0–0.1)
BASOS PCT: 1 % (ref 0.0–3.0)
EOS ABS: 0.2 10*3/uL (ref 0.0–0.7)
Eosinophils Relative: 2.6 % (ref 0.0–5.0)
HEMATOCRIT: 46.2 % (ref 39.0–52.0)
HEMOGLOBIN: 15.3 g/dL (ref 13.0–17.0)
LYMPHS ABS: 2.2 10*3/uL (ref 0.7–4.0)
Lymphocytes Relative: 22.8 % (ref 12.0–46.0)
MCHC: 33.1 g/dL (ref 30.0–36.0)
MCV: 86.7 fl (ref 78.0–100.0)
Monocytes Absolute: 0.9 10*3/uL (ref 0.1–1.0)
Monocytes Relative: 10 % (ref 3.0–12.0)
NEUTROS ABS: 6 10*3/uL (ref 1.4–7.7)
NEUTROS PCT: 63.6 % (ref 43.0–77.0)
Platelets: 255 10*3/uL (ref 150.0–400.0)
RBC: 5.33 Mil/uL (ref 4.22–5.81)
RDW: 14.3 % (ref 11.5–15.5)
WBC: 9.5 10*3/uL (ref 4.0–10.5)

## 2015-08-27 LAB — POCT URINALYSIS DIPSTICK
BILIRUBIN UA: NEGATIVE
GLUCOSE UA: NEGATIVE
KETONES UA: NEGATIVE
Leukocytes, UA: NEGATIVE
Nitrite, UA: NEGATIVE
Spec Grav, UA: 1.015
Urobilinogen, UA: 0.2
pH, UA: 6.5

## 2015-08-27 LAB — COMPREHENSIVE METABOLIC PANEL
ALT: 16 U/L (ref 0–53)
AST: 15 U/L (ref 0–37)
Albumin: 4 g/dL (ref 3.5–5.2)
Alkaline Phosphatase: 82 U/L (ref 39–117)
BILIRUBIN TOTAL: 0.7 mg/dL (ref 0.2–1.2)
BUN: 20 mg/dL (ref 6–23)
CHLORIDE: 98 meq/L (ref 96–112)
CO2: 37 meq/L — AB (ref 19–32)
CREATININE: 1.52 mg/dL — AB (ref 0.40–1.50)
Calcium: 9.6 mg/dL (ref 8.4–10.5)
GFR: 48.38 mL/min — ABNORMAL LOW (ref >60.00)
GLUCOSE: 95 mg/dL (ref 70–99)
Potassium: 3.7 mEq/L (ref 3.5–5.1)
SODIUM: 139 meq/L (ref 135–145)
Total Protein: 7.2 g/dL (ref 6.0–8.3)

## 2015-08-27 LAB — LIPID PANEL
CHOL/HDL RATIO: 3
Cholesterol: 163 mg/dL (ref 0–200)
HDL: 49.6 mg/dL (ref >39.00)
LDL Cholesterol: 75 mg/dL (ref 0–99)
NONHDL: 112.96
TRIGLYCERIDES: 190 mg/dL — AB (ref 0.0–149.0)
VLDL: 38 mg/dL (ref 0.0–40.0)

## 2015-08-27 LAB — HEMOGLOBIN A1C: Hgb A1c MFr Bld: 5.3 % (ref 4.6–6.5)

## 2015-08-27 MED ORDER — CHLORTHALIDONE 25 MG PO TABS: 25.0000 mg | ORAL_TABLET | Freq: Every day | ORAL | Status: DC

## 2015-08-27 MED ORDER — VALSARTAN 160 MG PO TABS: 160.0000 mg | ORAL_TABLET | Freq: Two times a day (BID) | ORAL | Status: DC

## 2015-08-27 MED ORDER — GLUCOSE BLOOD VI STRP: ORAL_STRIP | Status: DC

## 2015-08-27 NOTE — Patient Instructions (Signed)

## 2015-08-27 NOTE — Progress Notes (Signed)
[name], male    DOB: 28-Mar-1945  Age: 70 y.o. MRN: [id number]    Subjective:  Subjective HPI Travis Daniels presents for f/u dm , cholesterol and bp.     HPI HYPERTENSION  Blood pressure range-not checking  Chest pain- no      Dyspnea- no Lightheadedness- non   Edema- no Other side effects - no   Medication compliance: good Low salt diet- yes  DIABETES  Blood Sugar ranges-see home reading--scanned in Polyuria- no New Visual problems- no Hypoglycemic symptoms- no Other side effects-no Medication compliance - good Last eye exam- due   HYPERLIPIDEMIA  Medication compliance- good RUQ pain- no  Muscle aches- no Other side effects    Review of Systems  Constitutional: Negative for diaphoresis, appetite change, fatigue and unexpected weight change.  Eyes: Negative for pain, redness and visual disturbance.  Respiratory: Negative for cough, chest tightness, shortness of breath and wheezing.   Cardiovascular: Negative for chest pain, palpitations and leg swelling.  Endocrine: Negative for cold intolerance, heat intolerance, polydipsia, polyphagia and polyuria.  Genitourinary: Negative for dysuria, frequency and difficulty urinating.  Neurological: Negative for dizziness, light-headedness, numbness and headaches.    History Past Medical History  Diagnosis Date  . Hyperlipidemia   . Hypertension   . 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 B12 deficiency   . Lower extremity edema   . COPD (chronic obstructive pulmonary disease) ([place]) 01/2011    FVC 47%, FEV1 49%   . Arthritis   . Secondary pulmonary hypertension ([place]) 01/2011    RV syst pressure 30-40 mm Hg on ECHO     He has past surgical history that includes Finger surgery (Right, 11); Vasectomy; Total shoulder arthroplasty (Right, 01/21/2013); Colonoscopy w/ polypectomy (12/19/07); and transthoracic echocardiogram (01/2011).   His family history includes CAD in his mother;  Cancer in his father; Epilepsy in his son.He reports that he has never smoked. He has never used smokeless tobacco. He reports that he drinks alcohol. He reports that he does not use illicit drugs.  Current Outpatient Prescriptions on File Prior to Visit  Medication Sig Dispense Refill  . albuterol (PROVENTIL HFA;VENTOLIN HFA) 108 (90 BASE) MCG/ACT inhaler Inhale 2 puffs into the lungs every 6 (six) hours as needed for wheezing or shortness of breath.    Marland Kitchen aspirin 81 MG tablet Take 81 mg by mouth daily.    . betamethasone dipropionate (DIPROLENE) 0.05 % cream Apply topically 2 (two) times daily. 30 g 1  . Cholecalciferol (VITAMIN D-3) 1000 UNITS CAPS Take 1,000 Units by mouth.    . diltiazem (CARDIZEM CD) 300 MG 24 hr capsule Take 1 capsule (300 mg total) by mouth daily. 90 capsule 3  . Garlic (GARLIQUE PO) Take 1 tablet by mouth daily.    . Glucosamine-Chondroit-Vit C-Mn (GLUCOSAMINE 1500 COMPLEX PO) Take 1,500 mg by mouth daily.    . naproxen sodium (ANAPROX) 220 MG tablet Take 220 mg by mouth 2 (two) times daily with a meal.    . nystatin (MYCOSTATIN/NYSTOP) 100000 UNIT/GM POWD APPLY TOPICALLY 4 (FOUR) TIMES DAILY. 15 g 1  . ONETOUCH DELICA LANCETS FINE MISC Check blood sugar daily 100 each 12  . tiotropium (SPIRIVA) 18 MCG inhalation capsule Place 18 mcg into inhaler and inhale daily.     No current facility-administered medications on file prior to visit.     Objective:  Objective Physical Exam  Constitutional: He is oriented to person, place, and  time. Vital signs are normal. He appears well-developed and well-nourished. He is sleeping.  HENT:  Head: Normocephalic and atraumatic.  Mouth/Throat: Oropharynx is clear and moist.  Eyes: EOM are normal. Pupils are equal, round, and reactive to light.  Neck: Normal range of motion. Neck supple. No thyromegaly present.  Cardiovascular: Normal rate and regular rhythm.   No murmur heard. Pulmonary/Chest: Effort normal and breath sounds  normal. No respiratory distress. He has no wheezes. He has no rales. He exhibits no tenderness.  Musculoskeletal: He exhibits no edema or tenderness.  Neurological: He is alert and oriented to person, place, and time.  Skin: Skin is warm and dry.  Psychiatric: He has a normal mood and affect. His behavior is normal. Judgment and thought content normal.  Nursing note and vitals reviewed. Sensory exam of the foot is normal, tested with the monofilament. Good pulses, no lesions or ulcers, good peripheral pulses. BP 136/90 mmHg  Pulse 73  Temp(Src) 99.1 F (37.3 C) (Oral)  Ht 6\' 1"  (1.854 m)  Wt 320 lb 3.2 oz (145.242 kg)  BMI 42.25 kg/m2  SpO2 93% Wt Readings from Last 3 Encounters:  08/27/15 320 lb 3.2 oz (145.242 kg)  02/26/15 302 lb 6.4 oz (137.168 kg)  12/10/14 296 lb (134.265 kg)     Lab Results  Component Value Date   WBC 9.5 08/27/2015   HGB 15.3 08/27/2015   HCT 46.2 08/27/2015   PLT 255.0 08/27/2015   GLUCOSE 95 08/27/2015   CHOL 163 08/27/2015   TRIG 190.0* 08/27/2015   HDL 49.60 08/27/2015   LDLCALC 75 08/27/2015   ALT 16 08/27/2015   AST 15 08/27/2015   NA 139 08/27/2015   K 3.7 08/27/2015   CL 98 08/27/2015   CREATININE 1.52* 08/27/2015   BUN 20 08/27/2015   CO2 37* 08/27/2015   TSH 0.900 02/26/2015   PSA 4.96* 02/26/2015   INR 0.95 01/14/2013   HGBA1C 5.3 08/27/2015   MICROALBUR 69.1 02/26/2015    Dg Foot Complete Right  03/25/2014  CLINICAL DATA:  Foot pain for 4 days. EXAM: RIGHT FOOT COMPLETE - 3+ VIEW COMPARISON:  None. FINDINGS: There are moderate degenerative changes involving the first tarsal metatarsal joint, metatarsal phalangeal joint and interphalangeal joint. Degenerative changes also noted at the fifth metatarsal phalangeal joint. No acute bony findings or bone lesions. There are calcaneal spurring changes. The Achilles tendon appears thickened and there may be chronic Achilles tendinopathy. IMPRESSION: Degenerative changes but no acute bony  findings. Calcaneal spurring changes with a thickened appearing Achilles tendon which may suggest chronic tendinopathy. Electronically Signed   By: Kalman Jewels M.D.   On: 03/25/2014 14:35     Assessment & Plan:  Plan I have discontinued Travis Daniels lisinopril and ONE TOUCH ULTRA TEST. I am also having him maintain his aspirin, Vitamin D-3, naproxen sodium, albuterol, ONETOUCH DELICA LANCETS FINE, diltiazem, betamethasone dipropionate, tiotropium, Garlic (GARLIQUE PO), Glucosamine-Chondroit-Vit C-Mn (GLUCOSAMINE 1500 COMPLEX PO), nystatin, chlorthalidone, valsartan, and glucose blood.  Meds ordered this encounter  Medications  . DISCONTD: chlorthalidone (HYGROTON) 25 MG tablet    Sig: Take 1 tablet (25 mg total) by mouth daily.    Dispense:  90 tablet    Refill:  3  . DISCONTD: glucose blood (ONE TOUCH ULTRA TEST) test strip    Sig: CHECK BLOOD SUGAR TWICE DAILY    Dispense:  100 each    Refill:  5    DX  E11.65  . DISCONTD: valsartan (DIOVAN) 160 MG tablet  Sig: Take 1 tablet (160 mg total) by mouth 2 (two) times daily.    Dispense:  180 tablet    Refill:  3  . chlorthalidone (HYGROTON) 25 MG tablet    Sig: Take 1 tablet (25 mg total) by mouth daily.    Dispense:  90 tablet    Refill:  3  . valsartan (DIOVAN) 160 MG tablet    Sig: Take 1 tablet (160 mg total) by mouth 2 (two) times daily.    Dispense:  180 tablet    Refill:  3  . glucose blood (ONE TOUCH ULTRA TEST) test strip    Sig: CHECK BLOOD SUGAR TWICE DAILY    Dispense:  100 each    Refill:  5    DX  E11.65    Problem List Items Addressed This Visit    None    Visit Diagnoses    Hyperlipidemia LDL goal <70    -  Primary    Relevant Medications    chlorthalidone (HYGROTON) 25 MG tablet    valsartan (DIOVAN) 160 MG tablet    Other Relevant Orders    CBC with Differential/Platelet (Completed)    Hemoglobin A1c (Completed)    Lipid panel (Completed)    POCT urinalysis dipstick (Completed)    DM (diabetes  mellitus) type II controlled peripheral vascular disorder (HCC)        Relevant Medications    chlorthalidone (HYGROTON) 25 MG tablet    valsartan (DIOVAN) 160 MG tablet    glucose blood (ONE TOUCH ULTRA TEST) test strip    Other Relevant Orders    Comprehensive metabolic panel (Completed)    CBC with Differential/Platelet (Completed)    Hemoglobin A1c (Completed)    POCT urinalysis dipstick (Completed)    Essential hypertension        Relevant Medications    chlorthalidone (HYGROTON) 25 MG tablet    valsartan (DIOVAN) 160 MG tablet    Other Relevant Orders    CBC with Differential/Platelet (Completed)    Hemoglobin A1c (Completed)    POCT urinalysis dipstick (Completed)    Hematuria        Relevant Orders    Urine culture       Follow-up: Return in about 6 months (around 02/26/2016), or if symptoms worsen or fail to improve.  Ann Held, DO

## 2015-08-27 NOTE — Progress Notes (Signed)
Pre visit review using our clinic review tool, if applicable. No additional management support is needed unless otherwise documented below in the visit note. 

## 2015-08-29 LAB — URINE CULTURE
COLONY COUNT: NO GROWTH
Organism ID, Bacteria: NO GROWTH

## 2015-11-29 DIAGNOSIS — R972 Elevated prostate specific antigen [PSA]: Secondary | ICD-10-CM | POA: Diagnosis not present

## 2015-12-10 DIAGNOSIS — R809 Proteinuria, unspecified: Secondary | ICD-10-CM | POA: Diagnosis not present

## 2015-12-10 DIAGNOSIS — Z23 Encounter for immunization: Secondary | ICD-10-CM | POA: Diagnosis not present

## 2015-12-10 DIAGNOSIS — E1129 Type 2 diabetes mellitus with other diabetic kidney complication: Secondary | ICD-10-CM | POA: Diagnosis not present

## 2015-12-10 DIAGNOSIS — I10 Essential (primary) hypertension: Secondary | ICD-10-CM | POA: Diagnosis not present

## 2015-12-10 DIAGNOSIS — E785 Hyperlipidemia, unspecified: Secondary | ICD-10-CM | POA: Diagnosis not present

## 2015-12-10 DIAGNOSIS — R3129 Other microscopic hematuria: Secondary | ICD-10-CM | POA: Diagnosis not present

## 2015-12-10 DIAGNOSIS — N183 Chronic kidney disease, stage 3 (moderate): Secondary | ICD-10-CM | POA: Diagnosis not present

## 2016-01-25 ENCOUNTER — Ambulatory Visit: Payer: Medicare Other | Admitting: Pulmonary Disease

## 2016-01-27 ENCOUNTER — Telehealth: Payer: Self-pay | Admitting: Family Medicine

## 2016-01-27 NOTE — Telephone Encounter (Signed)
Called Travis Daniels to schedule awv. Pt did not answer. Will attempt to call pt again to schedule appt.

## 2016-02-14 ENCOUNTER — Ambulatory Visit (INDEPENDENT_AMBULATORY_CARE_PROVIDER_SITE_OTHER): Payer: Medicare Other

## 2016-02-14 ENCOUNTER — Ambulatory Visit (INDEPENDENT_AMBULATORY_CARE_PROVIDER_SITE_OTHER): Payer: Medicare Other | Admitting: Orthopaedic Surgery

## 2016-02-14 ENCOUNTER — Other Ambulatory Visit (INDEPENDENT_AMBULATORY_CARE_PROVIDER_SITE_OTHER): Payer: Self-pay

## 2016-02-14 ENCOUNTER — Ambulatory Visit (INDEPENDENT_AMBULATORY_CARE_PROVIDER_SITE_OTHER): Payer: Self-pay

## 2016-02-14 ENCOUNTER — Encounter (INDEPENDENT_AMBULATORY_CARE_PROVIDER_SITE_OTHER): Payer: Self-pay | Admitting: Orthopaedic Surgery

## 2016-02-14 VITALS — BP 166/98 | HR 98 | Resp 14 | Ht 73.0 in | Wt 320.0 lb

## 2016-02-14 DIAGNOSIS — M25562 Pain in left knee: Secondary | ICD-10-CM | POA: Diagnosis not present

## 2016-02-14 DIAGNOSIS — G8929 Other chronic pain: Secondary | ICD-10-CM

## 2016-02-14 DIAGNOSIS — M25561 Pain in right knee: Secondary | ICD-10-CM | POA: Diagnosis not present

## 2016-02-14 NOTE — Progress Notes (Signed)
Office Visit Note   Patient: Travis Daniels           Date of Birth: [birth year]           MRN: [id number] Visit Date: 02/14/2016              Requested by: Ann Held, DO [address] [place], [place] [address] PCP: Ann Held, DO   Assessment & Plan: Visit Diagnoses: No diagnosis found.  Plan: I will send Mr. Travis Daniels to the [facility] vascular surgeons for evaluation of bilateral venous stasis changes. We have discussed different treatment options in detail for the end-stage osteoarthritis of both of his knees. Continue that discussion after he's been evaluated for the above.  Mr. Travis Daniels BMI is approximately 44. We had a long discussion about his weight and how it may impact surgery for knee replacement. He presently weighs 313 pounds and at 275 pounds as BMI would be less than 40   Orders:  No orders of the defined types were placed in this encounter.  No orders of the defined types were placed in this encounter.     Procedures: No procedures performed   Clinical Data: No additional findings.   Subjective: No chief complaint on file.   Pt complaining of BIL knee pain    Mr. Travis Daniels has complained of chronic bilateral knee pain for "a long time. He has tried Advil but no other type of intervention. He has been [facility] at [facility] and it was suggested he try Visco supplementation. He's been very hazard to have any injections thinking that it probably "would not work". He's had difficulty bending scooping squatting, getting in and out of bed and maneuvering up and down stairs. He denies injury or trauma. He also has had chronic vascular changes of both lower extremities with chronic edema. He does have support stockings but does not "like to wear them".  Review of Systems   Objective: Vital Signs: There were no vitals taken for this visit.  Physical Exam  Ortho Exam exam of both knees reveals increased varus. He had  very minimal effusions bilaterally. Mostly medial joint pain with some patellar crepitation. There was full extension and approximately 95-100 of flexion bilaterally. There were large varicose veins about the anterior aspect of the left knee that were asymptomatic.  Chronic venous stasis changes from the mid tibia distally with chronic edema about the ankle and both feet. He has chronic fungal infections of his toes but good pulses. Sensation was intact  Specialty Comments:  No specialty comments available.  Imaging: No results found.   PMFS History: Patient Active Problem List   Diagnosis Date Noted  . Tendonitis, Achilles, right 03/25/2014  . Pain in joint, ankle and foot 03/23/2014  . Cellulitis of left lower leg 01/20/2014  . Diabetes mellitus type II, uncontrolled ([place]) 07/04/2013  . Severe obesity (BMI >= 40) ([place]) 07/03/2013  . OSA (obstructive sleep apnea) 01/23/2013  . Hypersomnolence 01/23/2013  . Osteoarthritis of shoulder 01/23/2013  . Obesity, Class III, BMI 40-49.9 (morbid obesity) ([place]) 01/23/2013  . Acute respiratory failure with hypoxia ([place]) 01/21/2013  . COPD (chronic obstructive pulmonary disease) ([place]) 01/21/2013  . Hypertension 01/21/2013  . Chest pain 10/30/2012  . Dyspnea 10/30/2012  . Preop cardiovascular exam 10/30/2012  . Venous stasis dermatitis 09/17/2012  . Essential hypertension, benign 09/10/2012  . Peripheral vascular disease ([place]) 09/10/2012  . Pain in joint, shoulder region 09/10/2012  Past Medical History:  Diagnosis Date  . Arthritis   . B12 deficiency   . COPD (chronic obstructive pulmonary disease) ([place]) 01/2011   FVC 47%, FEV1 49%   . 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 Hyperlipidemia   . Hypertension   . Lower extremity edema   . Secondary pulmonary hypertension ([place]) 01/2011   RV syst pressure 30-40 mm Hg on ECHO     Family History  Problem Relation Age of Onset  . Cancer Father     colon and pancreas  .  Epilepsy Son   . CAD Mother     MI in her 30s    Past Surgical History:  Procedure Laterality Date  . 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 Ronelle Nigh at [facility] in [place], [place])  . FINGER SURGERY Right 11   rt middle finger  . TOTAL SHOULDER ARTHROPLASTY Right 01/21/2013   Procedure: SHOULDER HEMI ARTHROPLASTY;  Surgeon: Garald Balding, MD;  Location: [facility];  Service: Orthopedics;  Laterality: Right;  . TRANSTHORACIC ECHOCARDIOGRAM  01/2011   EF 55-60%, mild asymmetric LVH, elevated RV systolic pressure  . VASECTOMY     Social History   Occupational History  . retired    Social History Main Topics  . Smoking status: Never Smoker  . Smokeless tobacco: Never Used     Comment: occ alcohol  . Alcohol use 0.0 oz/week     Comment: 2-3 drinks occasionally  . Drug use: No  . Sexual activity: Yes

## 2016-02-14 NOTE — Addendum Note (Signed)
Addended by: Mervyn Skeeters on: 02/14/2016 01:09 PM   Modules accepted: Orders

## 2016-02-17 ENCOUNTER — Other Ambulatory Visit: Payer: Self-pay

## 2016-02-24 ENCOUNTER — Encounter: Payer: Self-pay | Admitting: Medical

## 2016-02-24 ENCOUNTER — Ambulatory Visit (INDEPENDENT_AMBULATORY_CARE_PROVIDER_SITE_OTHER): Payer: Medicare Other | Admitting: Medical

## 2016-02-24 ENCOUNTER — Ambulatory Visit (HOSPITAL_BASED_OUTPATIENT_CLINIC_OR_DEPARTMENT_OTHER)
Admission: RE | Admit: 2016-02-24 | Discharge: 2016-02-24 | Disposition: A | Discharge status: Home / Self Care | Payer: Medicare Other | Source: Ambulatory Visit | Attending: Medical | Admitting: Medical

## 2016-02-24 VITALS — BP 138/88 | HR 90 | Temp 98.7°F | Ht 73.0 in | Wt 316.2 lb

## 2016-02-24 DIAGNOSIS — M79605 Pain in left leg: Secondary | ICD-10-CM | POA: Diagnosis not present

## 2016-02-24 DIAGNOSIS — L03116 Cellulitis of left lower limb: Secondary | ICD-10-CM

## 2016-02-24 DIAGNOSIS — M79662 Pain in left lower leg: Secondary | ICD-10-CM | POA: Insufficient documentation

## 2016-02-24 LAB — CBC WITH DIFFERENTIAL/PLATELET
BASOS ABS: 106 {cells}/uL (ref 0–200)
Basophils Relative: 1 %
EOS ABS: 212 {cells}/uL (ref 15–500)
Eosinophils Relative: 2 %
HCT: 41.3 % (ref 38.5–50.0)
Hemoglobin: 13.8 g/dL (ref 13.2–17.1)
LYMPHS PCT: 16 %
Lymphs Abs: 1696 cells/uL (ref 850–3900)
MCH: 28.8 pg (ref 27.0–33.0)
MCHC: 33.4 g/dL (ref 32.0–36.0)
MCV: 86.2 fL (ref 80.0–100.0)
MONOS PCT: 10 %
MPV: 9 fL (ref 7.5–12.5)
Monocytes Absolute: 1060 cells/uL — ABNORMAL HIGH (ref 200–950)
NEUTROS PCT: 71 %
Neutro Abs: 7526 cells/uL (ref 1500–7800)
PLATELETS: 262 10*3/uL (ref 140–400)
RBC: 4.79 MIL/uL (ref 4.20–5.80)
RDW: 13.9 % (ref 11.0–15.0)
WBC: 10.6 10*3/uL (ref 3.8–10.8)

## 2016-02-24 MED ORDER — CLINDAMYCIN HCL 300 MG PO CAPS: 300.0000 mg | ORAL_CAPSULE | Freq: Three times a day (TID) | ORAL | 0 refills | Status: DC

## 2016-02-24 MED ORDER — CEFTRIAXONE SODIUM 1 G IJ SOLR
1.0000 g | Freq: Once | INTRAMUSCULAR | Status: AC
  Administered 2016-02-24: 1 g via INTRAMUSCULAR

## 2016-02-24 NOTE — Progress Notes (Signed)
Pre visit review using our clinic review tool, if applicable. No additional management support is needed unless otherwise documented below in the visit note. 

## 2016-02-24 NOTE — Progress Notes (Signed)
Subjective:    [name], male    DOB: 1945/06/11, 70 y.o.   MRN: [id number]  HPI  Pt in for lt calf swelling since Monday afternoon. Last week felt fevers and chills that was before leg swelled. But no current fevers or chills. Some decreased appetite this week which is unusual for him. No abdomen pain. No increased sob, wheezing compared to his usual copd.  Pt states 3 years ago had cellulitis and treated in ED first and then given oral antibiotics. Was not admitted.   Another event had rt calf cellulitis. Got better with oral antibiotics only.  Swollen calf is hyperpigmented and tender to palpation.  Pt is prediabetic. Summer a1-c 5.3.       Review of Systems  Constitutional: Positive for chills and fever.       Last week but not now.  Respiratory: Negative for cough, chest tightness, shortness of breath and wheezing.   Cardiovascular: Negative for chest pain and palpitations.  Musculoskeletal: Negative for back pain, myalgias and neck pain.       Lt calf pain.  Skin: Positive for rash.       See hpi and exam.  Neurological: Negative for dizziness and headaches.  Psychiatric/Behavioral: Negative for behavioral problems and confusion.   Past Medical History:  Diagnosis Date  . Arthritis   . B12 deficiency   . COPD (chronic obstructive pulmonary disease) ([place]) 01/2011   FVC 47%, FEV1 49%   . 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 Hyperlipidemia   . Hypertension   . Lower extremity edema   . Secondary pulmonary hypertension 01/2011   RV syst pressure 30-40 mm Hg on ECHO      Social History   Social History  . Marital status: Married    Spouse name: N/A  . Number of children: 4  . Years of education: N/A   Occupational History  . retired    Social History Main Topics  . Smoking status: Never Smoker  . Smokeless tobacco: Never Used     Comment: occ alcohol  . Alcohol use 0.0 oz/week     Comment: 2-3 drinks occasionally    . Drug use: No  . Sexual activity: Yes   Other Topics Concern  . Not on file   Social History Narrative   Mr. Tai lives with his wife in [place]. He is recently retired and relocated to [facility] from [place]. Historically, his medical provider has been [facility] in [place].    Past Surgical History:  Procedure Laterality Date  . 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 Ronelle Nigh at [facility] in [place], [place])  . FINGER SURGERY Right 11   rt middle finger  . TOTAL SHOULDER ARTHROPLASTY Right 01/21/2013   Procedure: SHOULDER HEMI ARTHROPLASTY;  Surgeon: Garald Balding, MD;  Location: [place];  Service: Orthopedics;  Laterality: Right;  . TRANSTHORACIC ECHOCARDIOGRAM  01/2011   EF 55-60%, mild asymmetric LVH, elevated RV systolic pressure  . VASECTOMY      Family History  Problem Relation Age of Onset  . Cancer Father     colon and pancreas  . Epilepsy Son   . CAD Mother     MI in her 84s    No Known Allergies  Current Outpatient Prescriptions on File Prior to Visit  Medication Sig Dispense Refill  . albuterol (PROVENTIL HFA;VENTOLIN  HFA) 108 (90 BASE) MCG/ACT inhaler Inhale 2 puffs into the lungs every 6 (six) hours as needed for wheezing or shortness of breath.    Marland Kitchen aspirin 81 MG tablet Take 81 mg by mouth daily.    . betamethasone dipropionate (DIPROLENE) 0.05 % cream Apply topically 2 (two) times daily. 30 g 1  . chlorthalidone (HYGROTON) 25 MG tablet Take 1 tablet (25 mg total) by mouth daily. 90 tablet 3  . Cholecalciferol (VITAMIN D-3) 1000 UNITS CAPS Take 1,000 Units by mouth.    . diltiazem (CARDIZEM CD) 300 MG 24 hr capsule Take 1 capsule (300 mg total) by mouth daily. 90 capsule 3  . Garlic (GARLIQUE PO) Take 1 tablet by mouth daily.    . Glucosamine-Chondroit-Vit C-Mn (GLUCOSAMINE 1500 COMPLEX PO) Take 1,500 mg by mouth daily.    Marland Kitchen glucose blood (ONE TOUCH ULTRA TEST) test  strip CHECK BLOOD SUGAR TWICE DAILY 100 each 5  . naproxen sodium (ANAPROX) 220 MG tablet Take 220 mg by mouth 2 (two) times daily with a meal.    . nystatin (MYCOSTATIN/NYSTOP) 100000 UNIT/GM POWD APPLY TOPICALLY 4 (FOUR) TIMES DAILY. 15 g 1  . ONETOUCH DELICA LANCETS FINE MISC Check blood sugar daily 100 each 12  . tiotropium (SPIRIVA) 18 MCG inhalation capsule Place 18 mcg into inhaler and inhale daily.    . valsartan (DIOVAN) 160 MG tablet Take 1 tablet (160 mg total) by mouth 2 (two) times daily. 180 tablet 3   No current facility-administered medications on file prior to visit.     BP 138/88 (BP Location: Left Arm, Patient Position: Sitting, Cuff Size: Normal)   Pulse 90   Temp 98.7 F (37.1 C) (Oral)   Ht 6\' 1"  (1.854 m)   Wt (!) 316 lb 3.2 oz (143.4 kg)   SpO2 98%   BMI 41.72 kg/m       Objective:   Physical Exam  General- No acute distress. Pleasant patient. Neck- Full range of motion, no jvd Lungs- Clear, even and unlabored.(no sob or dyspnea lying supine) Heart- regular rate and rhythm. Neurologic- CNII- XII grossly intact.  Left lower ext- moderate to severe swelling compared to rt side., 1-2+ edema. Very warm and tender. Hyperpigmented band around calf about 12 cm wide mid calf. Negative homans signs.  Lower ext-pulses intact and good capillary refill       Assessment & Plan:  You do appear to have cellulitis. We gave you rocephin 1 gram im in office today. Also rx clindamycin antibiotic. Use probiotic while on the antibiotic.  Will get stat cbc today.  Stat US of lower ext today to assess if dvt may be present.  Follow up tomorrow afternoon to see if some incremental improvement.  Tonight or over the weekend if calf swells more, if fever, chills, sweating, or spreading discoloration then ED evaluation. In that event you would need iv antibiotics and maybe admission.   Giavonna Pflum, Percell Miller, PA-C

## 2016-02-24 NOTE — Patient Instructions (Addendum)
You do appear to have cellulitis. We gave you rocephin 1 gram im in office today. Also rx clindamycin antibiotic. Use probiotic while on the antibiotic.  Will get stat cbc today.  Stat US of lower ext today to assess if dvt may be present.  Follow up tomorrow afternoon to see if some incremental improvement.  Tonight or over the weekend if calf swells more, if fever, chills, sweating, or spreading discoloration then ED evaluation. In that event you would need iv antibiotics and maybe admission.

## 2016-02-25 ENCOUNTER — Ambulatory Visit (INDEPENDENT_AMBULATORY_CARE_PROVIDER_SITE_OTHER): Payer: Medicare Other | Admitting: Medical

## 2016-02-25 ENCOUNTER — Encounter: Payer: Self-pay | Admitting: Medical

## 2016-02-25 VITALS — BP 130/80 | HR 77 | Temp 98.0°F | Ht 73.0 in | Wt 315.6 lb

## 2016-02-25 DIAGNOSIS — L03116 Cellulitis of left lower limb: Secondary | ICD-10-CM | POA: Diagnosis not present

## 2016-02-25 LAB — PATHOLOGIST SMEAR REVIEW

## 2016-02-25 NOTE — Patient Instructions (Addendum)
Your leg looks better after only less than one day of clindamycin. Continue the antibiotic. If area worsens over weekend then ED evaluation(Pt expresses understanding).   Follow up on Monday 1 pm or as needed

## 2016-02-25 NOTE — Progress Notes (Signed)
Subjective:    [name], male    DOB: 1945-05-23, 70 y.o.   MRN: [id number]  HPI  Pt in for follow up. He has no fever, no chills or sweats. The [place] of lower ext did not show dvt. The area is less tender. He has been on clindamycin since yesterday. I gave him rocephin 1 gram IM yesterday. His wbc was not elevated. Pt took first clindamycin last night and second tablet this am.    Review of Systems  Constitutional: Negative for chills, fatigue and fever.  Respiratory: Negative for cough, shortness of breath and wheezing.   Cardiovascular: Negative for chest pain and palpitations.  Gastrointestinal: Negative for abdominal pain.  Musculoskeletal:       See hpi.  Skin: Positive for rash.       See hpi.  Neurological: Negative for dizziness and headaches.  Hematological: Negative for adenopathy. Does not bruise/bleed easily.    Past Medical History:  Diagnosis Date  . Arthritis   . B12 deficiency   . COPD (chronic obstructive pulmonary disease) ([place]) 01/2011   FVC 47%, FEV1 49%   . 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 Hyperlipidemia   . Hypertension   . Lower extremity edema   . Secondary pulmonary hypertension 01/2011   RV syst pressure 30-40 mm Hg on ECHO      Social History   Social History  . Marital status: Married    Spouse name: N/A  . Number of children: 4  . Years of education: N/A   Occupational History  . retired    Social History Main Topics  . Smoking status: Never Smoker  . Smokeless tobacco: Never Used     Comment: occ alcohol  . Alcohol use 0.0 oz/week     Comment: 2-3 drinks occasionally  . Drug use: No  . Sexual activity: Yes   Other Topics Concern  . Not on file   Social History Narrative   Mr. Lukose lives with his wife in [place]. He is recently retired and relocated to [facility] from [place]. Historically, his medical provider has been [facility] in [place].    Past Surgical History:  Procedure Laterality Date    . 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 Ronelle Nigh at [facility] in [place], [place])  . FINGER SURGERY Right 11   rt middle finger  . TOTAL SHOULDER ARTHROPLASTY Right 01/21/2013   Procedure: SHOULDER HEMI ARTHROPLASTY;  Surgeon: Garald Balding, MD;  Location: [facility];  Service: Orthopedics;  Laterality: Right;  . TRANSTHORACIC ECHOCARDIOGRAM  01/2011   EF 55-60%, mild asymmetric LVH, elevated RV systolic pressure  . VASECTOMY      Family History  Problem Relation Age of Onset  . Cancer Father     colon and pancreas  . Epilepsy Son   . CAD Mother     MI in her 16s    No Known Allergies  Current Outpatient Prescriptions on File Prior to Visit  Medication Sig Dispense Refill  . albuterol (PROVENTIL HFA;VENTOLIN HFA) 108 (90 BASE) MCG/ACT inhaler Inhale 2 puffs into the lungs every 6 (six) hours as needed for wheezing or shortness of breath.    Marland Kitchen aspirin 81 MG tablet Take 81 mg by mouth daily.    . betamethasone dipropionate (DIPROLENE) 0.05 % cream Apply topically 2 (two) times daily. 30 g 1  .  chlorthalidone (HYGROTON) 25 MG tablet Take 1 tablet (25 mg total) by mouth daily. 90 tablet 3  . Cholecalciferol (VITAMIN D-3) 1000 UNITS CAPS Take 1,000 Units by mouth.    . clindamycin (CLEOCIN) 300 MG capsule Take 1 capsule (300 mg total) by mouth 3 (three) times daily. 30 capsule 0  . diltiazem (CARDIZEM CD) 300 MG 24 hr capsule Take 1 capsule (300 mg total) by mouth daily. 90 capsule 3  . Garlic (GARLIQUE PO) Take 1 tablet by mouth daily.    . Glucosamine-Chondroit-Vit C-Mn (GLUCOSAMINE 1500 COMPLEX PO) Take 1,500 mg by mouth daily.    Marland Kitchen glucose blood (ONE TOUCH ULTRA TEST) test strip CHECK BLOOD SUGAR TWICE DAILY 100 each 5  . naproxen sodium (ANAPROX) 220 MG tablet Take 220 mg by mouth 2 (two) times daily with a meal.    . nystatin (MYCOSTATIN/NYSTOP) 100000 UNIT/GM POWD APPLY TOPICALLY 4  (FOUR) TIMES DAILY. 15 g 1  . ONETOUCH DELICA LANCETS FINE MISC Check blood sugar daily 100 each 12  . tiotropium (SPIRIVA) 18 MCG inhalation capsule Place 18 mcg into inhaler and inhale daily.    . valsartan (DIOVAN) 160 MG tablet Take 1 tablet (160 mg total) by mouth 2 (two) times daily. 180 tablet 3   No current facility-administered medications on file prior to visit.     BP 130/80 (BP Location: Left Arm, Patient Position: Sitting, Cuff Size: Normal)   Pulse 77   Temp 98 F (36.7 C) (Oral)   Ht 6\' 1"  (1.854 m)   Wt (!) 315 lb 9.6 oz (143.2 kg)   SpO2 96%   BMI 41.64 kg/m      Objective:   Physical Exam  .General- No acute distress. Pleasant patient. Neck- Full range of motion, no jvd Lungs- Clear, even and unlabored.(no sob or dyspnea lying supine) Heart- regular rate and rhythm. Neurologic- CNII- XII grossly intact.  Left lower ext- moderate to severe swelling compared to rt side., 1 + edema. Less warm and tender today. Hyperpigmented band around calf about 12 cm wide mid calf. Swelling looks less than yesterday.  Negative homans signs.       Assessment & Plan:  Your leg looks better after only less than one day of clindamycin. Continue the antibiotic. If area worsens over weekend then ED evaluation(Pt expresses understanding).   Follow up on Monday 1 pm or as needed

## 2016-02-26 ENCOUNTER — Telehealth: Payer: Self-pay | Admitting: Medical

## 2016-02-26 NOTE — Progress Notes (Signed)
Subjective:    [name], male    DOB: 04-01-1945, 70 y.o.   MRN: [id number]  HPI  Pt in for follow. See last note. He had some incremental improvement on last visit follow up for his left lower ext cellulitis. The initial appeareance on 02-24-2016 appeared moderate to severe. LLE Korea was negative for dvt and his wbc was not elevated.  Wanted him to come back today to make sure having continued improvement in light of history of prior cellulitis that almost required admission to hospital.  Since last visit her reports his leg swelling is less than before. No fever, no chills, no sweats, and leg is less painful. No sob.   Review of Systems  Constitutional: Negative for chills, fatigue and fever.  Respiratory: Negative for cough, chest tightness, shortness of breath and wheezing.   Cardiovascular: Negative for chest pain and palpitations.  Musculoskeletal:       See hpi.  Skin: Positive for rash.       Pt states leg is less swollen left side.  Neurological: Negative for dizziness and headaches.  Hematological: Negative for adenopathy. Does not bruise/bleed easily.  Psychiatric/Behavioral: Negative for behavioral problems and confusion.   Past Medical History:  Diagnosis Date  . Arthritis   . B12 deficiency   . COPD (chronic obstructive pulmonary disease) ([place]) 01/2011   FVC 47%, FEV1 49%   . 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 Hyperlipidemia   . Hypertension   . Lower extremity edema   . Secondary pulmonary hypertension 01/2011   RV syst pressure 30-40 mm Hg on ECHO      Social History   Social History  . Marital status: Married    Spouse name: N/A  . Number of children: 4  . Years of education: N/A   Occupational History  . retired    Social History Main Topics  . Smoking status: Never Smoker  . Smokeless tobacco: Never Used     Comment: occ alcohol  . Alcohol use 0.0 oz/week     Comment: 2-3 drinks occasionally  . Drug use:  No  . Sexual activity: Yes   Other Topics Concern  . Not on file   Social History Narrative   Travis Daniels lives with his wife in [place]. He is recently retired and relocated to [facility] from [place]. Historically, his medical provider has been [facility] in [place].    Past Surgical History:  Procedure Laterality Date  . 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 Ronelle Nigh at [facility] in [place], [place])  . FINGER SURGERY Right 11   rt middle finger  . TOTAL SHOULDER ARTHROPLASTY Right 01/21/2013   Procedure: SHOULDER HEMI ARTHROPLASTY;  Surgeon: Garald Balding, MD;  Location: [place];  Service: Orthopedics;  Laterality: Right;  . TRANSTHORACIC ECHOCARDIOGRAM  01/2011   EF 55-60%, mild asymmetric LVH, elevated RV systolic pressure  . VASECTOMY      Family History  Problem Relation Age of Onset  . Cancer Father     colon and pancreas  . Epilepsy Son   . CAD Mother     MI in her 72s    No Known Allergies  Current Outpatient Prescriptions on File Prior to Visit  Medication Sig Dispense Refill  . albuterol (PROVENTIL HFA;VENTOLIN HFA) 108 (90 BASE) MCG/ACT inhaler Inhale 2 puffs into the lungs every 6 (  six) hours as needed for wheezing or shortness of breath.    Marland Kitchen aspirin 81 MG tablet Take 81 mg by mouth daily.    . betamethasone dipropionate (DIPROLENE) 0.05 % cream Apply topically 2 (two) times daily. 30 g 1  . chlorthalidone (HYGROTON) 25 MG tablet Take 1 tablet (25 mg total) by mouth daily. 90 tablet 3  . Cholecalciferol (VITAMIN D-3) 1000 UNITS CAPS Take 1,000 Units by mouth.    . clindamycin (CLEOCIN) 300 MG capsule Take 1 capsule (300 mg total) by mouth 3 (three) times daily. 30 capsule 0  . diltiazem (CARDIZEM CD) 300 MG 24 hr capsule Take 1 capsule (300 mg total) by mouth daily. 90 capsule 3  . Garlic (GARLIQUE PO) Take 1 tablet by mouth daily.    . Glucosamine-Chondroit-Vit C-Mn  (GLUCOSAMINE 1500 COMPLEX PO) Take 1,500 mg by mouth daily.    Marland Kitchen glucose blood (ONE TOUCH ULTRA TEST) test strip CHECK BLOOD SUGAR TWICE DAILY 100 each 5  . naproxen sodium (ANAPROX) 220 MG tablet Take 220 mg by mouth 2 (two) times daily with a meal.    . nystatin (MYCOSTATIN/NYSTOP) 100000 UNIT/GM POWD APPLY TOPICALLY 4 (FOUR) TIMES DAILY. 15 g 1  . ONETOUCH DELICA LANCETS FINE MISC Check blood sugar daily 100 each 12  . tiotropium (SPIRIVA) 18 MCG inhalation capsule Place 18 mcg into inhaler and inhale daily.    . valsartan (DIOVAN) 160 MG tablet Take 1 tablet (160 mg total) by mouth 2 (two) times daily. 180 tablet 3   No current facility-administered medications on file prior to visit.     There were no vitals taken for this visit.      Objective:   Physical Exam  General- No acute distress. Pleasant patient. Neck- Full range of motion, no jvd Lungs- Clear, even and unlabored. Heart- regular rate and rhythm. Neurologic- CNII- XII grossly intact.  Left lower ext- mild  Swelling now  compared to rt side., faint 1+ edema at best.. Less warm and tender today. Hyperpigmented band around calf about 12 cm wide mid calf but less prominent.. Swelling looks less than yesterday.  Negative homans signs.      Assessment & Plan:  Your leg looks much improved now. I do think you are making significant improvement now. You could follow up around December 26 if you are not back to baseline. Sooner if you worsen. Continue the clindamycin. Remember to use probiotics while on antibiotic.  Keyleigh Manninen, Percell Miller, PA-C

## 2016-02-26 NOTE — Telephone Encounter (Signed)
Did you see my message regarding interpretation of lab. Number I can call?

## 2016-02-28 ENCOUNTER — Ambulatory Visit: Payer: Medicare Other | Admitting: Family Medicine

## 2016-02-28 ENCOUNTER — Encounter: Payer: Self-pay | Admitting: Medical

## 2016-02-28 ENCOUNTER — Ambulatory Visit (INDEPENDENT_AMBULATORY_CARE_PROVIDER_SITE_OTHER): Payer: Medicare Other | Admitting: Medical

## 2016-02-28 VITALS — BP 118/76 | HR 78 | Temp 98.1°F | Ht 73.0 in | Wt 316.0 lb

## 2016-02-28 DIAGNOSIS — L03116 Cellulitis of left lower limb: Secondary | ICD-10-CM

## 2016-02-28 NOTE — Patient Instructions (Signed)
Your leg looks much improved now. I do think you are making significant improvement now. You could follow up around December 26 if you are not back to baseline. Sooner if you worsen. Continue the clindamycin. Remember to use probiotics while on antibiotic.

## 2016-03-07 NOTE — Progress Notes (Signed)
Pre visit review using our clinic review tool, if applicable. No additional management support is needed unless otherwise documented below in the visit note. 

## 2016-03-07 NOTE — Progress Notes (Signed)
Subjective:   Travis Daniels is a 70 y.o. male who presents for Medicare Annual (Subsequent) preventive examination.  Review of Systems:  No ROS.  Medicare Wellness Visit.  Cardiac Risk Factors include: advanced age (>101men, >42 women);diabetes mellitus;hypertension;obesity (BMI >30kg/m2);sedentary lifestyle;male gender   Sleep patterns: Sleeps about 6-8 hrs per night. Feels very rested. Wears cpap. Home Safety/Smoke Alarms: Smoke alarms and carbon monoxide detectors in place.    Living environment; residence and Firearm Safety: Lives with wife and 2 cats. 2 story home. Feels safe. Firearms properly stored. Seat Belt Safety/Bike Helmet: Wears seatbelt.   Counseling:   Eye Exam- Wears glasses. Follows with [facility] annually.  Dental- No dentist. Dental resources provided.  Male:   CCS- Last 09/22/14: diverticulosis. F/u in 3 yrs per pt.  PSA-  Lab Results  Component Value Date   PSA 4.96 (H) 02/26/2015   PSA 5.18 (H) 08/21/2014       Objective:     Vitals: BP 138/72 (BP Location: Left Arm, Patient Position: Sitting, Cuff Size: Large)   Pulse 78   Temp 97.9 F (36.6 C) (Oral)   Resp 16   Ht 6\' 3"  (1.905 m)   Wt (!) 321 lb 12.8 oz (146 kg)   SpO2 96%   BMI 40.22 kg/m   Body mass index is 40.22 kg/m.   Tobacco History  Smoking Status  . Never Smoker  Smokeless Tobacco  . Never Used    Comment: occ alcohol     Counseling given: Not Answered   Past Medical History:  Diagnosis Date  . Arthritis   . B12 deficiency   . COPD (chronic obstructive pulmonary disease) ([facility]) 01/2011   FVC 47%, FEV1 49%   . 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 Hyperlipidemia   . Hypertension   . Lower extremity edema   . Secondary pulmonary hypertension 01/2011   RV syst pressure 30-40 mm Hg on ECHO    Past Surgical History:  Procedure Laterality Date  . COLONOSCOPY W/ POLYPECTOMY  12/19/07   one small polyp, sigmoid diverticulosis, lipoma rectum,  agioectasia/AVM in descending colon (Dr. Ronelle Nigh at [facility] in [place], [place])  . FINGER SURGERY Right 11   rt middle finger  . TOTAL SHOULDER ARTHROPLASTY Right 01/21/2013   Procedure: SHOULDER HEMI ARTHROPLASTY;  Surgeon: Garald Balding, MD;  Location: [facility];  Service: Orthopedics;  Laterality: Right;  . TRANSTHORACIC ECHOCARDIOGRAM  01/2011   EF 55-60%, mild asymmetric LVH, elevated RV systolic pressure  . VASECTOMY     Family History  Problem Relation Age of Onset  . CAD Mother     MI in her 42s  . Cancer Father     colon and pancreas  . Epilepsy Son    History  Sexual Activity  . Sexual activity: Yes    Outpatient Encounter Prescriptions as of 03/09/2016  Medication Sig  . albuterol (PROVENTIL HFA;VENTOLIN HFA) 108 (90 BASE) MCG/ACT inhaler Inhale 2 puffs into the lungs every 6 (six) hours as needed for wheezing or shortness of breath.  Marland Kitchen aspirin 81 MG tablet Take 81 mg by mouth daily.  . betamethasone dipropionate (DIPROLENE) 0.05 % cream Apply topically 2 (two) times daily.  . chlorthalidone (HYGROTON) 25 MG tablet Take 1 tablet (25 mg total) by mouth daily.  . Cholecalciferol (VITAMIN D-3) 1000 UNITS CAPS Take 1,000 Units by mouth.  . diltiazem (CARDIZEM CD) 300 MG 24 hr capsule Take 1 capsule (300 mg total)  by mouth daily.  . Garlic (GARLIQUE PO) Take 1 tablet by mouth daily.  Marland Kitchen glucose blood (ONE TOUCH ULTRA TEST) test strip CHECK BLOOD SUGAR TWICE DAILY  . naproxen sodium (ANAPROX) 220 MG tablet Take 220 mg by mouth 2 (two) times daily with a meal.  . nystatin (MYCOSTATIN/NYSTOP) 100000 UNIT/GM POWD APPLY TOPICALLY 4 (FOUR) TIMES DAILY.  Marland Kitchen ONETOUCH DELICA LANCETS FINE MISC Check blood sugar daily  . tiotropium (SPIRIVA) 18 MCG inhalation capsule Place 18 mcg into inhaler and inhale daily.  . valsartan (DIOVAN) 160 MG tablet Take 1 tablet (160 mg total) by mouth 2 (two) times daily.   No facility-administered encounter medications on file as of 03/09/2016.      Activities of Daily Living In your present state of health, do you have any difficulty performing the following activities: 03/09/2016  Hearing? (No Data)  Vision? N  Difficulty concentrating or making decisions? N  Walking or climbing stairs? Y  Dressing or bathing? N  Doing errands, shopping? N  Preparing Food and eating ? N  Using the Toilet? N  In the past six months, have you accidently leaked urine? N  Do you have problems with loss of bowel control? N  Managing your Medications? N  Managing your Finances? N  Housekeeping or managing your Housekeeping? N  Some recent data might be hidden    Patient Care Team: Ann Held, DO as PCP - General (Family Medicine) Donato Heinz, MD as Consulting Physician (Nephrology)    Assessment:    Physical assessment deferred to PCP.  Exercise Activities and Dietary recommendations Current Exercise Habits: The patient does not participate in regular exercise at present, Exercise limited by: None identified Diet (meal preparation, eat out, water intake, caffeinated beverages, dairy products, fruits and vegetables): in general, a "healthy" diet  , well balanced, low fat/ cholesterol   Goals    . Have bilateral knee replacements.      Fall Risk Fall Risk  03/09/2016 02/28/2016 02/17/2016 02/26/2015 08/21/2014  Falls in the past year? No No No No No  Number falls in past yr: - - - - -  Injury with Fall? - - - - -   Depression Screen PHQ 2/9 Scores 03/09/2016 02/28/2016 02/26/2015 08/21/2014  PHQ - 2 Score 0 0 0 0     Cognitive Function MMSE - Mini Mental State Exam 03/09/2016  Orientation to time 5  Orientation to Place 5  Registration 3  Attention/ Calculation 5  Recall 3  Language- name 2 objects 2  Language- repeat 1  Language- follow 3 step command 3  Language- read & follow direction 1  Write a sentence 1  Copy design 1  Total score 30        Immunization History  Administered Date(s) Administered   . Influenza Split 11/11/2012, 12/11/2012  . Influenza, High Dose Seasonal PF 12/03/2014  . Influenza,inj,Quad PF,36+ Mos 11/21/2013  . Influenza-Unspecified 01/05/2016  . Pneumococcal Conjugate-13 11/12/2011  . Pneumococcal Polysaccharide-23 09/30/2012  . Tdap 03/25/2013  . Zoster 10/01/2012   Screening Tests Health Maintenance  Topic Date Due  . FOOT EXAM  05/05/2015  . HEMOGLOBIN A1C  02/26/2016  . INFLUENZA VACCINE  06/10/2016 (Originally 10/12/2015)  . OPHTHALMOLOGY EXAM  08/10/2016  . TETANUS/TDAP  03/26/2023  . COLONOSCOPY  11/24/2024  . ZOSTAVAX  Completed  . Hepatitis C Screening  Completed  . PNA vac Low Risk Adult  Completed      Plan:     Continue  to eat heart healthy diet (full of fruits, vegetables, whole grains, lean protein, water--limit salt, fat, and sugar intake) and increase physical activity as tolerated. Bring a copy of your advance directives to your next office visit.  During the course of the visit the patient was educated and counseled about the following appropriate screening and preventive services:   Vaccines to include Pneumoccal, Influenza, Hepatitis B, Td, Zostavax, HCV  Cardiovascular Disease  Colorectal cancer screening  Bone density screening  Diabetes screening  Glaucoma screening  Mammography/PAP  Nutrition counseling   Patient Instructions (the written plan) was given to the patient.   Shela Nevin, [place]  03/09/2016

## 2016-03-09 ENCOUNTER — Encounter: Payer: Self-pay | Admitting: Family Medicine

## 2016-03-09 ENCOUNTER — Ambulatory Visit (INDEPENDENT_AMBULATORY_CARE_PROVIDER_SITE_OTHER): Payer: Medicare Other | Admitting: Family Medicine

## 2016-03-09 VITALS — BP 138/72 | HR 78 | Temp 97.9°F | Resp 16 | Ht 75.0 in | Wt 321.8 lb

## 2016-03-09 DIAGNOSIS — I10 Essential (primary) hypertension: Secondary | ICD-10-CM

## 2016-03-09 DIAGNOSIS — Z0000 Encounter for general adult medical examination without abnormal findings: Secondary | ICD-10-CM | POA: Diagnosis not present

## 2016-03-09 DIAGNOSIS — L03116 Cellulitis of left lower limb: Secondary | ICD-10-CM | POA: Diagnosis not present

## 2016-03-09 NOTE — Patient Instructions (Addendum)
Cellulitis, Adult Cellulitis is a skin infection. The infected area is usually red and tender. This condition occurs most often in the arms and lower legs. The infection can travel to the muscles, blood, and underlying tissue and become serious. It is very important to get treated for this condition. What are the causes? Cellulitis is caused by bacteria. The bacteria enter through a break in the skin, such as a cut, burn, insect bite, open sore, or crack. What increases the risk? This condition is more likely to occur in people who:  Have a weak defense system (immune system).  Have open wounds on the skin such as cuts, burns, bites, and scrapes. Bacteria can enter the body through these open wounds.  Are older.  Have diabetes.  Have a type of long-lasting (chronic) liver disease (cirrhosis) or kidney disease.  Use IV drugs. What are the signs or symptoms? Symptoms of this condition include:  Redness, streaking, or spotting on the skin.  Swollen area of the skin.  Tenderness or pain when an area of the skin is touched.  Warm skin.  Fever.  Chills.  Blisters. How is this diagnosed? This condition is diagnosed based on a medical history and physical exam. You may also have tests, including:  Blood tests.  Lab tests.  Imaging tests. How is this treated? Treatment for this condition may include:  Medicines, such as antibiotic medicines or antihistamines.  Supportive care, such as rest and application of cold or warm cloths (cold or warm compresses) to the skin.  Hospital care, if the condition is severe. The infection usually gets better within 1-2 days of treatment. Follow these instructions at home:  Take over-the-counter and prescription medicines only as told by your health care provider.  If you were prescribed an antibiotic medicine, take it as told by your health care provider. Do not stop taking the antibiotic even if you start to feel better.  Drink  enough fluid to keep your urine clear or pale yellow.  Do not touch or rub the infected area.  Raise (elevate) the infected area above the level of your heart while you are sitting or lying down.  Apply warm or cold compresses to the affected area as told by your health care provider.  Keep all follow-up visits as told by your health care provider. This is important. These visits let your health care provider make sure a more serious infection is not developing. Contact a health care provider if:  You have a fever.  Your symptoms do not improve within 1-2 days of starting treatment.  Your bone or joint underneath the infected area becomes painful after the skin has healed.  Your infection returns in the same area or another area.  You notice a swollen bump in the infected area.  You develop new symptoms.  You have a general ill feeling (malaise) with muscle aches and pains. Get help right away if:  Your symptoms get worse.  You feel very sleepy.  You develop vomiting or diarrhea that persists.  You notice red streaks coming from the infected area.  Your red area gets larger or turns dark in color. This information is not intended to replace advice given to you by your health care provider. Make sure you discuss any questions you have with your health care provider. Document Released: 12/07/2004 Document Revised: 07/08/2015 Document Reviewed: 01/06/2015 Elsevier Interactive Patient Education  2017 [place] to eat heart healthy diet (full of fruits, vegetables, whole grains,  lean protein, water--limit salt, fat, and sugar intake) and increase physical activity as tolerated. Bring a copy of your advance directives to your next office visit.

## 2016-03-09 NOTE — Assessment & Plan Note (Signed)
Resolved abx finished con't elevation/ compression socks Drink plenty of fluids rto prn

## 2016-03-09 NOTE — Progress Notes (Signed)
[name], male    DOB: 11-17-1945  Age: 70 y.o. MRN: [id number]    Subjective:  Subjective  HPI CAMMERON BLICKENSTAFF presents for f/u cellulitis---  abx are finished -- leg has improved and is 100% per pt.    Review of Systems  Constitutional: Negative for appetite change, diaphoresis, fatigue and unexpected weight change.  Eyes: Negative for pain, redness and visual disturbance.  Respiratory: Negative for cough, chest tightness, shortness of breath and wheezing.   Cardiovascular: Negative for chest pain, palpitations and leg swelling.  Endocrine: Negative for cold intolerance, heat intolerance, polydipsia, polyphagia and polyuria.  Genitourinary: Negative for difficulty urinating, dysuria and frequency.  Skin: Negative for color change, pallor, rash and wound.  Neurological: Negative for dizziness, light-headedness, numbness and headaches.    History Past Medical History:  Diagnosis Date  . Arthritis   . B12 deficiency   . COPD (chronic obstructive pulmonary disease) ([facility]) 01/2011   FVC 47%, FEV1 49%   . 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 Hyperlipidemia   . Hypertension   . Lower extremity edema   . Secondary pulmonary hypertension 01/2011   RV syst pressure 30-40 mm Hg on ECHO     He has a past surgical history that includes Finger surgery (Right, 11); Vasectomy; Total shoulder arthroplasty (Right, 01/21/2013); Colonoscopy w/ polypectomy (12/19/07); and transthoracic echocardiogram (01/2011).   His family history includes CAD in his mother; Cancer in his father; Epilepsy in his son.He reports that he has never smoked. He has never used smokeless tobacco. He reports that he drinks alcohol. He reports that he does not use drugs.  Current Outpatient Prescriptions on File Prior to Visit  Medication Sig Dispense Refill  . albuterol (PROVENTIL HFA;VENTOLIN HFA) 108 (90 BASE) MCG/ACT inhaler Inhale 2 puffs into the lungs every 6 (six) hours as  needed for wheezing or shortness of breath.    Marland Kitchen aspirin 81 MG tablet Take 81 mg by mouth daily.    . betamethasone dipropionate (DIPROLENE) 0.05 % cream Apply topically 2 (two) times daily. 30 g 1  . chlorthalidone (HYGROTON) 25 MG tablet Take 1 tablet (25 mg total) by mouth daily. 90 tablet 3  . Cholecalciferol (VITAMIN D-3) 1000 UNITS CAPS Take 1,000 Units by mouth.    . diltiazem (CARDIZEM CD) 300 MG 24 hr capsule Take 1 capsule (300 mg total) by mouth daily. 90 capsule 3  . Garlic (GARLIQUE PO) Take 1 tablet by mouth daily.    Marland Kitchen glucose blood (ONE TOUCH ULTRA TEST) test strip CHECK BLOOD SUGAR TWICE DAILY 100 each 5  . naproxen sodium (ANAPROX) 220 MG tablet Take 220 mg by mouth 2 (two) times daily with a meal.    . nystatin (MYCOSTATIN/NYSTOP) 100000 UNIT/GM POWD APPLY TOPICALLY 4 (FOUR) TIMES DAILY. 15 g 1  . ONETOUCH DELICA LANCETS FINE MISC Check blood sugar daily 100 each 12  . tiotropium (SPIRIVA) 18 MCG inhalation capsule Place 18 mcg into inhaler and inhale daily.    . valsartan (DIOVAN) 160 MG tablet Take 1 tablet (160 mg total) by mouth 2 (two) times daily. 180 tablet 3   No current facility-administered medications on file prior to visit.      Objective:  Objective  Physical Exam  Constitutional: He is oriented to person, place, and time. Vital signs are normal. He appears well-developed and well-nourished. He is sleeping.  HENT:  Head: Normocephalic and atraumatic.  Mouth/Throat: Oropharynx is clear and moist.  Eyes: EOM are normal. Pupils are equal, round, and reactive to light.  Neck: Normal range of motion. Neck supple. No thyromegaly present.  Cardiovascular: Normal rate and regular rhythm.   No murmur heard. Pulmonary/Chest: Effort normal and breath sounds normal. No respiratory distress. He has no wheezes. He has no rales. He exhibits no tenderness.  Musculoskeletal: He exhibits edema. He exhibits no tenderness.  Neurological: He is alert and oriented to person,  place, and time.  Skin: Skin is warm and dry.  Psychiatric: He has a normal mood and affect. His behavior is normal. Judgment and thought content normal.  Nursing note and vitals reviewed.  BP 138/72 (BP Location: Left Arm, Patient Position: Sitting, Cuff Size: Large)   Pulse 78   Temp 97.9 F (36.6 C) (Oral)   Resp 16   Ht 6\' 3"  (1.905 m)   Wt (!) 321 lb 12.8 oz (146 kg)   SpO2 96%   BMI 40.22 kg/m  Wt Readings from Last 3 Encounters:  03/09/16 (!) 321 lb 12.8 oz (146 kg)  02/28/16 (!) 316 lb (143.3 kg)  02/25/16 (!) 315 lb 9.6 oz (143.2 kg)     Lab Results  Component Value Date   WBC 10.6 02/24/2016   HGB 13.8 02/24/2016   HCT 41.3 02/24/2016   PLT 262 02/24/2016   GLUCOSE 95 08/27/2015   CHOL 163 08/27/2015   TRIG 190.0 (H) 08/27/2015   HDL 49.60 08/27/2015   LDLCALC 75 08/27/2015   ALT 16 08/27/2015   AST 15 08/27/2015   NA 139 08/27/2015   K 3.7 08/27/2015   CL 98 08/27/2015   CREATININE 1.52 (H) 08/27/2015   BUN 20 08/27/2015   CO2 37 (H) 08/27/2015   TSH 0.900 02/26/2015   PSA 4.96 (H) 02/26/2015   INR 0.95 01/14/2013   HGBA1C 5.3 08/27/2015   MICROALBUR 69.1 02/26/2015    US Venous Img Lower Unilateral Left  Result Date: 02/24/2016 CLINICAL DATA:  Left lower extremity pain and swelling for 2 days EXAM: LEFT LOWER EXTREMITY VENOUS DUPLEX ULTRASOUND TECHNIQUE: Doppler venous assessment of the left lower extremity deep venous system was performed, including characterization of spectral flow, compressibility, and phasicity. COMPARISON:  None. FINDINGS: There is complete compressibility of the left common femoral, femoral, and popliteal veins. Doppler analysis demonstrates respiratory phasicity and augmentation of flow with calf compression. No obvious superficial vein or calf vein thrombosis. IMPRESSION: No evidence of left lower extremity DVT. Electronically Signed   By: Marybelle Killings M.D.   On: 02/24/2016 13:51     Assessment & Plan:  Plan  I am having Mr.  Goettl maintain his aspirin, Vitamin D-3, naproxen sodium, albuterol, ONETOUCH DELICA LANCETS FINE, diltiazem, betamethasone dipropionate, tiotropium, Garlic (GARLIQUE PO), nystatin, chlorthalidone, valsartan, and glucose blood.  No orders of the defined types were placed in this encounter.   Problem List Items Addressed This Visit      Unprioritized   Cellulitis of left lower leg - Primary    Resolved abx finished con't elevation/ compression socks Drink plenty of fluids rto prn       Other Visit Diagnoses    HTN (hypertension), benign       Relevant Orders   Comprehensive metabolic panel   Lipid panel     Pt to have medicare wellness today as well  Follow-up: Return in about 3 months (around 06/07/2016) for hypertension, hyperlipidemia.  Ann Held, DO

## 2016-03-10 LAB — COMPREHENSIVE METABOLIC PANEL
ALK PHOS: 71 U/L (ref 39–117)
ALT: 18 U/L (ref 0–53)
AST: 19 U/L (ref 0–37)
Albumin: 3.8 g/dL (ref 3.5–5.2)
BILIRUBIN TOTAL: 0.5 mg/dL (ref 0.2–1.2)
BUN: 23 mg/dL (ref 6–23)
CO2: 36 meq/L — AB (ref 19–32)
CREATININE: 1.76 mg/dL — AB (ref 0.40–1.50)
Calcium: 9.8 mg/dL (ref 8.4–10.5)
Chloride: 100 mEq/L (ref 96–112)
GFR: 40.79 mL/min — ABNORMAL LOW (ref >60.00)
GLUCOSE: 79 mg/dL (ref 70–99)
Potassium: 3.9 mEq/L (ref 3.5–5.1)
Sodium: 143 mEq/L (ref 135–145)
TOTAL PROTEIN: 6.9 g/dL (ref 6.0–8.3)

## 2016-03-10 LAB — LIPID PANEL
CHOL/HDL RATIO: 3
Cholesterol: 160 mg/dL (ref 0–200)
HDL: 47.6 mg/dL (ref >39.00)
LDL Cholesterol: 81 mg/dL (ref 0–99)
NONHDL: 112.55
Triglycerides: 159 mg/dL — ABNORMAL HIGH (ref 0.0–149.0)
VLDL: 31.8 mg/dL (ref 0.0–40.0)

## 2016-03-14 ENCOUNTER — Telehealth: Payer: Self-pay

## 2016-03-14 DIAGNOSIS — E785 Hyperlipidemia, unspecified: Secondary | ICD-10-CM

## 2016-03-14 DIAGNOSIS — R7989 Other specified abnormal findings of blood chemistry: Secondary | ICD-10-CM

## 2016-03-14 NOTE — Telephone Encounter (Signed)
-----   Message from Ann Held, DO sent at 03/12/2016 11:59 AM EST ----- Overall good--- kidney function slightly worse---we will forward to [place] kidney Recheck 6 months Lipid, cmp

## 2016-03-14 NOTE — Telephone Encounter (Signed)
Spoke with pt, pt states he understand results, and follow up appointment with lab. Future labs entered. Pt had no questions or concerns. LB

## 2016-03-17 ENCOUNTER — Ambulatory Visit (INDEPENDENT_AMBULATORY_CARE_PROVIDER_SITE_OTHER): Payer: Medicare Other | Admitting: Orthopaedic Surgery

## 2016-03-17 ENCOUNTER — Encounter (INDEPENDENT_AMBULATORY_CARE_PROVIDER_SITE_OTHER): Payer: Self-pay | Admitting: Orthopaedic Surgery

## 2016-03-17 VITALS — BP 143/76 | HR 70 | Resp 14 | Ht 70.0 in | Wt 312.0 lb

## 2016-03-17 DIAGNOSIS — M170 Bilateral primary osteoarthritis of knee: Secondary | ICD-10-CM

## 2016-03-17 DIAGNOSIS — G8929 Other chronic pain: Secondary | ICD-10-CM | POA: Diagnosis not present

## 2016-03-17 DIAGNOSIS — M25562 Pain in left knee: Secondary | ICD-10-CM | POA: Diagnosis not present

## 2016-03-17 NOTE — Progress Notes (Signed)
Office Visit Note   Patient: RIVER SHOULTS           Date of Birth: 1945-07-06           MRN: [id number] Visit Date: 03/17/2016              Requested by: Ann Held, DO [address] [place], [place] [address] PCP: Ann Held, DO   Assessment & Plan: Visit Diagnoses: Bilateral end-stage osteoarthritis knees.  Plan: evaluation by Vein and vascular surgeons next month for chronic lower extremity venous stasis. Weight loss. Future consideration of knee replacement. Long discussion today lasting over 30 minutes regarding potential complications of knee replacement given his BMI of over 40, chronic lower extremity swelling,  recurrent cellulitis of both lower extremities, and prediabetic status. From my standpoint we need to address all of the above before I would consider surgery and have discussed this with him in detail.  Follow-Up Instructions: No Follow-up on file.   Orders:  No orders of the defined types were placed in this encounter.  No orders of the defined types were placed in this encounter.     Procedures: No procedures performed   Clinical Data: No additional findings.   Subjective: Chief Complaint  Patient presents with  . Left Knee - Pain  . Right Knee - Pain    Bil knee pain, difficulty walking, difficulty with steps, difficulty standing for long periods of time, popping at times, discuss TKR - bil., Advil helps x 3 at at time, pain worse with activity and at night.  Mr. Cocker was diagnosed with recurrent cellulitis both lower extremities approximately a week after he was seen in this office. He has been treated with appropriate antibiotics per his primary care physician. He does have chronic end-stage osteoarthritis of both knees and is being considered for knee replacement. He has a number of comorbidities that increase his risk for surgery including a BMI of over 40, recurrent cellulitis of both lower extremities  associated with venous stasis, pre-diabetes. He does wear compression hose. He has an appointment with the vein and vascular surgeons next month.  Review of Systems   Objective: Vital Signs: BP (!) 143/76 (BP Location: Left Arm, Patient Position: Sitting, Cuff Size: Normal)   Pulse 70   Resp 14   Ht 5\' 10"  (1.778 m)   Wt (!) 312 lb (141.5 kg)   BMI 44.77 kg/m   Physical Exam  Ortho Exam bilateral lower extremity venous stasis changes with chronic edema of both legs ankles and feet. Knee-high support stockings in place. Patient relates normal sensibility. Good capillary refill. I don't feel pulses. Full extension and approximately 100 of flexion both knees and no instability. Increased varus both knees no popliteal pain. Pain with range of motion of either hip. Straight leg raise negative bilaterally.  Specialty Comments:  No specialty comments available.  Imaging: No results found.   PMFS History: Patient Active Problem List   Diagnosis Date Noted  . Tendonitis, Achilles, right 03/25/2014  . Pain in joint, ankle and foot 03/23/2014  . Cellulitis of left lower leg 01/20/2014  . Diabetes mellitus type II, uncontrolled ([facility]) 07/04/2013  . Severe obesity (BMI >= 40) ([facility]) 07/03/2013  . OSA (obstructive sleep apnea) 01/23/2013  . Hypersomnolence 01/23/2013  . Osteoarthritis of shoulder 01/23/2013  . Obesity, Class III, BMI 40-49.9 (morbid obesity) ([facility]) 01/23/2013  . Acute respiratory failure with hypoxia ([facility]) 01/21/2013  . COPD (chronic obstructive  pulmonary disease) ([facility] [facility]) 01/21/2013  . Hypertension 01/21/2013  . Chest pain 10/30/2012  . Dyspnea 10/30/2012  . Preop cardiovascular exam 10/30/2012  . Venous stasis dermatitis 09/17/2012  . Essential hypertension, benign 09/10/2012  . Peripheral vascular disease ([facility]) 09/10/2012  . Pain in joint, shoulder region 09/10/2012   Past Medical History:  Diagnosis Date  . Arthritis   . B12 deficiency   . COPD (chronic  obstructive pulmonary disease) ([facility]) 01/2011   FVC 47%, FEV1 49%   . 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 Hyperlipidemia   . Hypertension   . Lower extremity edema   . Secondary pulmonary hypertension 01/2011   RV syst pressure 30-40 mm Hg on ECHO     Family History  Problem Relation Age of Onset  . CAD Mother     MI in her 38s  . Cancer Father     colon and pancreas  . Epilepsy Son     Past Surgical History:  Procedure Laterality Date  . 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 Ronelle [facility]igh at [facility] in [place], [place])  . FINGER SURGERY Right 11   rt middle finger  . TOTAL SHOULDER ARTHROPLASTY Right 01/21/2013   Procedure: SHOULDER HEMI ARTHROPLASTY;  Surgeon: Garald Balding, MD;  Location: [facility];  Service: Orthopedics;  Laterality: Right;  . TRANSTHORACIC ECHOCARDIOGRAM  01/2011   EF 55-60%, mild asymmetric LVH, elevated RV systolic pressure  . VASECTOMY     Social History   Occupational History  . retired    Social History Main Topics  . Smoking status: Never Smoker  . Smokeless tobacco: Never Used     Comment: occ alcohol  . Alcohol use 0.0 oz/week     Comment: 2-3 drinks occasionally  . Drug use: No  . Sexual activity: Yes

## 2016-03-31 ENCOUNTER — Other Ambulatory Visit: Payer: Self-pay | Admitting: Surgery

## 2016-03-31 DIAGNOSIS — I872 Venous insufficiency (chronic) (peripheral): Secondary | ICD-10-CM

## 2016-03-31 DIAGNOSIS — M25569 Pain in unspecified knee: Secondary | ICD-10-CM

## 2016-03-31 DIAGNOSIS — G8929 Other chronic pain: Secondary | ICD-10-CM

## 2016-04-04 ENCOUNTER — Ambulatory Visit: Payer: Medicare Other | Admitting: Pulmonary Disease

## 2016-04-06 DIAGNOSIS — R809 Proteinuria, unspecified: Secondary | ICD-10-CM | POA: Diagnosis not present

## 2016-04-06 DIAGNOSIS — N183 Chronic kidney disease, stage 3 (moderate): Secondary | ICD-10-CM | POA: Diagnosis not present

## 2016-04-06 DIAGNOSIS — E1129 Type 2 diabetes mellitus with other diabetic kidney complication: Secondary | ICD-10-CM | POA: Diagnosis not present

## 2016-04-06 DIAGNOSIS — N179 Acute kidney failure, unspecified: Secondary | ICD-10-CM | POA: Diagnosis not present

## 2016-04-06 DIAGNOSIS — E785 Hyperlipidemia, unspecified: Secondary | ICD-10-CM | POA: Diagnosis not present

## 2016-04-06 DIAGNOSIS — I10 Essential (primary) hypertension: Secondary | ICD-10-CM | POA: Diagnosis not present

## 2016-04-06 DIAGNOSIS — R3129 Other microscopic hematuria: Secondary | ICD-10-CM | POA: Diagnosis not present

## 2016-04-07 ENCOUNTER — Ambulatory Visit (INDEPENDENT_AMBULATORY_CARE_PROVIDER_SITE_OTHER): Payer: Medicare Other | Admitting: Pulmonary Disease

## 2016-04-07 ENCOUNTER — Encounter: Payer: Self-pay | Admitting: Pulmonary Disease

## 2016-04-07 VITALS — BP 132/78 | HR 53 | Ht 70.0 in | Wt 315.8 lb

## 2016-04-07 DIAGNOSIS — R0689 Other abnormalities of breathing: Secondary | ICD-10-CM

## 2016-04-07 DIAGNOSIS — G4733 Obstructive sleep apnea (adult) (pediatric): Secondary | ICD-10-CM | POA: Diagnosis not present

## 2016-04-07 DIAGNOSIS — J449 Chronic obstructive pulmonary disease, unspecified: Secondary | ICD-10-CM | POA: Diagnosis not present

## 2016-04-07 DIAGNOSIS — Z6841 Body Mass Index (BMI) 40.0 and over, adult: Secondary | ICD-10-CM

## 2016-04-07 NOTE — Progress Notes (Signed)
Current Outpatient Prescriptions on File Prior to Visit  Medication Sig  . albuterol (PROVENTIL HFA;VENTOLIN HFA) 108 (90 BASE) MCG/ACT inhaler Inhale 2 puffs into the lungs every 6 (six) hours as needed for wheezing or shortness of breath.  Marland Kitchen aspirin 81 MG tablet Take 81 mg by mouth daily.  . betamethasone dipropionate (DIPROLENE) 0.05 % cream Apply topically 2 (two) times daily.  . chlorthalidone (HYGROTON) 25 MG tablet Take 1 tablet (25 mg total) by mouth daily.  . Cholecalciferol (VITAMIN D-3) 1000 UNITS CAPS Take 1,000 Units by mouth.  . diltiazem (CARDIZEM CD) 300 MG 24 hr capsule Take 1 capsule (300 mg total) by mouth daily.  . Garlic (GARLIQUE PO) Take 1 tablet by mouth daily.  Marland Kitchen glucose blood (ONE TOUCH ULTRA TEST) test strip CHECK BLOOD SUGAR TWICE DAILY  . naproxen sodium (ANAPROX) 220 MG tablet Take 220 mg by mouth 2 (two) times daily with a meal.  . nystatin (MYCOSTATIN/NYSTOP) 100000 UNIT/GM POWD APPLY TOPICALLY 4 (FOUR) TIMES DAILY.  Marland Kitchen ONETOUCH DELICA LANCETS FINE MISC Check blood sugar daily  . tiotropium (SPIRIVA) 18 MCG inhalation capsule Place 18 mcg into inhaler and inhale daily.  . valsartan (DIOVAN) 160 MG tablet Take 1 tablet (160 mg total) by mouth 2 (two) times daily.   No current facility-administered medications on file prior to visit.     Chief Complaint  Patient presents with  . Follow-up    Wears CPAP nightly. Denies any issues with mask/pressure. DME: [employer]    Sleep tests PSG 04/10/13 >> AHI 30, SaO2 low 74% Auto CPAP 05/22/14 to 06/20/14 >> used on 30 of 30 nights with average 6 hrs and 36 min.  Average AHI is 2.2 with median CPAP 16 cm H2O and 95 th percentile CPAP 19 cm H20  Pulmonary tests Spirometry 04/07/16 >> FEV1 2.37 (73%), FEV1% 80  Cardiac tests Echo 01/22/13 >> EF 65 to 70%  Past medical history HTN, HLD, COPD, secondary pulmonary HTN  Past surgical history, Family history, Social history, Allergies reviewed  Vital signs BP 132/78 (BP  Location: Left Arm, Cuff Size: Normal)   Pulse (!) 53   Ht 5\' 10"  (1.778 m)   Wt (!) 315 lb 12.8 oz (143.2 kg)   SpO2 94%   BMI 45.31 kg/m   History of Present Illness: Travis Daniels is a 71 y.o. male with OSA.  He is doing okay with CPAP.  No issues with mask fit.  He is sleeping well.  He was dx with COPD while living in [place].  He didn't smoke, but his parents did.  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  He does okay with his breathing except in hot weather or if he walks up hills.  He gets spiriva from the [place].  He doesn't use it consistently because he doesn't feel like he needs it.  He will use albuterol intermittently, and this helps.  Physical Exam:  General - pleasant ENT - no sinus tenderness, MP 3, 2+ tonsils Cardiac - regular, no murmur Chest - no wheeze, raees Back - no tenderness Abd - soft, non tender Ext - no edema Neuro - normal strength Skin - no rashes Psych - normal mood   CMP Latest Ref Rng & Units 03/09/2016 08/27/2015 02/26/2015  Glucose 70 - 99 mg/dL 79 95 94  BUN 6 - 23 mg/dL 23 20 16   Creatinine 0.40 - 1.50 mg/dL 1.76(H) 1.52(H) 1.25  Sodium 135 - 145 mEq/L 143 139 139  Potassium 3.5 -  5.1 mEq/L 3.9 3.7 4.1  Chloride 96 - 112 mEq/L 100 98 99  CO2 19 - 32 mEq/L 36(H) 37(H) 32(H)  Calcium 8.4 - 10.5 mg/dL 9.8 9.6 9.5  Total Protein 6.0 - 8.3 g/dL 6.9 7.2 7.0  Total Bilirubin 0.2 - 1.2 mg/dL 0.5 0.7 0.7  Alkaline Phos 39 - 117 U/L 71 82 96  AST 0 - 37 U/L 19 15 19   ALT 0 - 53 U/L 18 16 20     CBC Latest Ref Rng & Units 02/24/2016 08/27/2015 02/26/2015  WBC 3.8 - 10.8 K/uL 10.6 9.5 9.2  Hemoglobin 13.2 - 17.1 g/dL 13.8 15.3 15.8  Hematocrit 38.5 - 50.0 % 41.3 46.2 47.1  Platelets 140 - 400 K/uL 262 255.0 244    ABG    Component Value Date/Time   PHART 7.229 (L) 01/21/2013 1843   PCO2ART 78.2 (HH) 01/21/2013 1843   PO2ART 175.0 (H) 01/21/2013 1843   HCO3 31.5 (H) 01/21/2013 1843   TCO2 32 01/18/2014 1144   O2SAT 99.3 01/21/2013 1843     Assessment/Plan:  Obstructive sleep apnea. - he is compliant with CPAP and reports benefit - continue auto CPAP  Reported hx of COPD. - his spirometry is more suggestive of restrictive disease - he can use prn albuterol  Elevated HCO3 on BMET. - concern for chronic hypercapnia possibly from OHS - will arrange for ONO on CPAP, and then decide if he needs repeat in lab titration study  Obesity. - discussed importance of weight loss   Patient Instructions  Will arrange for overnight oxygen test with CPAP  Albuterol two puffs every 4 to 6 hours as needed for cough, wheeze, chest congestion, or shortness of breath    Chesley Mires, MD [facility] Pager:  [phone] 04/07/2016, 11:22 AM

## 2016-04-07 NOTE — Patient Instructions (Addendum)
Will arrange for overnight oxygen test with CPAP  Albuterol two puffs every 4 to 6 hours as needed for cough, wheeze, chest congestion, or shortness of breath  Can stop spiriva  Follow up in 6 months

## 2016-04-14 ENCOUNTER — Encounter: Payer: Self-pay | Admitting: Surgery

## 2016-04-19 ENCOUNTER — Ambulatory Visit (HOSPITAL_COMMUNITY)
Admission: RE | Admit: 2016-04-19 | Discharge: 2016-04-19 | Disposition: A | Discharge status: Home / Self Care | Payer: Medicare Other | Source: Ambulatory Visit | Attending: Vascular Surgery | Admitting: Vascular Surgery

## 2016-04-19 ENCOUNTER — Encounter: Payer: Medicare Other | Admitting: Surgery

## 2016-04-19 DIAGNOSIS — M25569 Pain in unspecified knee: Secondary | ICD-10-CM | POA: Insufficient documentation

## 2016-04-19 DIAGNOSIS — I82812 Embolism and thrombosis of superficial veins of left lower extremities: Secondary | ICD-10-CM | POA: Diagnosis not present

## 2016-04-19 DIAGNOSIS — I872 Venous insufficiency (chronic) (peripheral): Secondary | ICD-10-CM | POA: Insufficient documentation

## 2016-04-19 DIAGNOSIS — G8929 Other chronic pain: Secondary | ICD-10-CM | POA: Diagnosis not present

## 2016-04-24 ENCOUNTER — Encounter: Payer: Self-pay | Admitting: Vascular Surgery

## 2016-04-28 ENCOUNTER — Encounter: Payer: Self-pay | Admitting: Medical

## 2016-04-28 ENCOUNTER — Ambulatory Visit (INDEPENDENT_AMBULATORY_CARE_PROVIDER_SITE_OTHER): Payer: Medicare Other | Admitting: Medical

## 2016-04-28 ENCOUNTER — Ambulatory Visit (HOSPITAL_BASED_OUTPATIENT_CLINIC_OR_DEPARTMENT_OTHER)
Admission: RE | Admit: 2016-04-28 | Discharge: 2016-04-28 | Disposition: A | Discharge status: Home / Self Care | Payer: Medicare Other | Source: Ambulatory Visit | Attending: Medical | Admitting: Medical

## 2016-04-28 VITALS — BP 127/58 | HR 78 | Temp 98.5°F | Ht 70.0 in | Wt 314.0 lb

## 2016-04-28 DIAGNOSIS — M19042 Primary osteoarthritis, left hand: Secondary | ICD-10-CM | POA: Diagnosis not present

## 2016-04-28 DIAGNOSIS — M79645 Pain in left finger(s): Secondary | ICD-10-CM | POA: Diagnosis not present

## 2016-04-28 DIAGNOSIS — R609 Edema, unspecified: Secondary | ICD-10-CM | POA: Insufficient documentation

## 2016-04-28 DIAGNOSIS — R936 Abnormal findings on diagnostic imaging of limbs: Secondary | ICD-10-CM | POA: Diagnosis not present

## 2016-04-28 LAB — URIC ACID: Uric Acid, Serum: 10 mg/dL — ABNORMAL HIGH (ref 4.0–8.0)

## 2016-04-28 MED ORDER — PREDNISONE 10 MG PO TABS: ORAL_TABLET | ORAL | 0 refills | Status: DC

## 2016-04-28 MED ORDER — CLINDAMYCIN HCL 150 MG PO CAPS: 150.0000 mg | ORAL_CAPSULE | Freq: Three times a day (TID) | ORAL | 0 refills | Status: DC

## 2016-04-28 NOTE — Progress Notes (Signed)
Pre visit review using our clinic review tool, if applicable. No additional management support is needed unless otherwise documented below in the visit note. 

## 2016-04-28 NOTE — Patient Instructions (Addendum)
For your left hand 3rd and 4th digit pain and swelling will get uric acid and a1c.  Also get xray of your hand.  You may have gout vs skin infection. Since Friday evening past 5 pm will rx taper dose prednisone and rx clindamycin.   Please eat low sugar diet and keep checking your blood sugars.  I will try to update you on results tonight or this weekend.  Follow up in 5-7 days or as needed

## 2016-04-28 NOTE — Progress Notes (Signed)
Subjective:    [name], male    DOB: April 03, 1945, 71 y.o.   MRN: [id number]  HPI  Pt in with on last Thursday had pip joint swelling, redness and warmth. This wed index finger pip joint swelling, redness and warmth this wed. Digits feel tight. Old hx of injury as child with glass in 3rd digit dip when 73 yo.  Pt is prediabetic but sugars are controlled.     Review of Systems  Constitutional: Negative for chills, fatigue and fever.  Respiratory: Negative for cough, chest tightness, shortness of breath and wheezing.   Cardiovascular: Negative for chest pain and palpitations.  Musculoskeletal:       Lt hand/digit pain.  Skin: Positive for rash.       Faint redness. See hpi and physical.  Hematological: Negative for adenopathy. Does not bruise/bleed easily.  Psychiatric/Behavioral: Negative for behavioral problems, confusion and sleep disturbance. The patient is not nervous/anxious.    Past Medical History:  Diagnosis Date  . Arthritis   . B12 deficiency   . COPD (chronic obstructive pulmonary disease) ([place]) 01/2011   FVC 47%, FEV1 49%   . 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 Hyperlipidemia   . Hypertension   . Lower extremity edema   . Secondary pulmonary hypertension 01/2011   RV syst pressure 30-40 mm Hg on ECHO      Social History   Social History  . Marital status: Married    Spouse name: N/A  . Number of children: 4  . Years of education: N/A   Occupational History  . retired    Social History Main Topics  . Smoking status: Never Smoker  . Smokeless tobacco: Never Used     Comment: occ alcohol  . Alcohol use 0.0 oz/week     Comment: 2-3 drinks occasionally  . Drug use: No  . Sexual activity: Yes   Other Topics Concern  . Not on file   Social History Narrative   Mr. Fergen lives with his wife in [place]. He is recently retired and relocated to [facility] from [place]. Historically, his medical provider has been [facility] in  [place].    Past Surgical History:  Procedure Laterality Date  . 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 Ronelle Nigh at [facility] in [place], [place])  . FINGER SURGERY Right 11   rt middle finger  . TOTAL SHOULDER ARTHROPLASTY Right 01/21/2013   Procedure: SHOULDER HEMI ARTHROPLASTY;  Surgeon: Garald Balding, MD;  Location: [place];  Service: Orthopedics;  Laterality: Right;  . TRANSTHORACIC ECHOCARDIOGRAM  01/2011   EF 55-60%, mild asymmetric LVH, elevated RV systolic pressure  . VASECTOMY      Family History  Problem Relation Age of Onset  . CAD Mother     MI in her 63s  . Cancer Father     colon and pancreas  . Epilepsy Son     No Known Allergies  Current Outpatient Prescriptions on File Prior to Visit  Medication Sig Dispense Refill  . albuterol (PROVENTIL HFA;VENTOLIN HFA) 108 (90 BASE) MCG/ACT inhaler Inhale 2 puffs into the lungs every 6 (six) hours as needed for wheezing or shortness of breath.    Marland Kitchen aspirin 81 MG tablet Take 81 mg by mouth daily.    . betamethasone dipropionate (DIPROLENE) 0.05 % cream Apply topically 2 (two) times daily. 30 g 1  .  chlorthalidone (HYGROTON) 25 MG tablet Take 1 tablet (25 mg total) by mouth daily. 90 tablet 3  . Cholecalciferol (VITAMIN D-3) 1000 UNITS CAPS Take 1,000 Units by mouth.    . diltiazem (CARDIZEM CD) 300 MG 24 hr capsule Take 1 capsule (300 mg total) by mouth daily. 90 capsule 3  . Garlic (GARLIQUE PO) Take 1 tablet by mouth daily.    Marland Kitchen glucose blood (ONE TOUCH ULTRA TEST) test strip CHECK BLOOD SUGAR TWICE DAILY 100 each 5  . naproxen sodium (ANAPROX) 220 MG tablet Take 220 mg by mouth 2 (two) times daily with a meal.    . nystatin (MYCOSTATIN/NYSTOP) 100000 UNIT/GM POWD APPLY TOPICALLY 4 (FOUR) TIMES DAILY. 15 g 1  . ONETOUCH DELICA LANCETS FINE MISC Check blood sugar daily 100 each 12  . valsartan (DIOVAN) 160 MG tablet Take 1 tablet  (160 mg total) by mouth 2 (two) times daily. 180 tablet 3   No current facility-administered medications on file prior to visit.     BP (!) 127/58   Pulse 78   Temp 98.5 F (36.9 C) (Oral)   Ht 5\' 10"  (1.778 m)   Wt (!) 314 lb (142.4 kg)   SpO2 95%   BMI 45.05 kg/m       Objective:   Physical Exam  General- No acute distress. Pleasant patient.  Lungs- Clear, even and unlabored. Heart- regular rate and rhythm. Neurologic- CNII- XII grossly intact.  Left hand- 2nd and 3rd digit moderate swelling. Moderate dip joint red, warm and swollen. Can only partial bend finger. No tracking up his arm. Palm left side pink and slight enlarged.      Assessment & Plan:  [id number].   For your left hand 3rd and 4th digit pain and swelling will get uric acid and a1c.  Also get xray of your hand.  You may have gout vs skin infection. Since Friday evening past 5 pm will rx taper dose prednisone and rx clindamycin.   Please eat low sugar diet and keep checking your blood sugars.  I will try to update you on results tonight or this weekend.  Follow up in 5-7 days or as needed  Janesha Brissette, Percell Miller, [employer]

## 2016-04-29 LAB — CBC WITH DIFFERENTIAL/PLATELET
BASOS ABS: 94 {cells}/uL (ref 0–200)
Basophils Relative: 1 %
EOS ABS: 188 {cells}/uL (ref 15–500)
Eosinophils Relative: 2 %
HEMATOCRIT: 42.3 % (ref 38.5–50.0)
HEMOGLOBIN: 14.2 g/dL (ref 13.2–17.1)
LYMPHS ABS: 1786 {cells}/uL (ref 850–3900)
Lymphocytes Relative: 19 %
MCH: 28.5 pg (ref 27.0–33.0)
MCHC: 33.6 g/dL (ref 32.0–36.0)
MCV: 84.8 fL (ref 80.0–100.0)
MPV: 9.7 fL (ref 7.5–12.5)
Monocytes Absolute: 752 cells/uL (ref 200–950)
Monocytes Relative: 8 %
NEUTROS PCT: 70 %
Neutro Abs: 6580 cells/uL (ref 1500–7800)
Platelets: 194 10*3/uL (ref 140–400)
RBC: 4.99 MIL/uL (ref 4.20–5.80)
RDW: 14.1 % (ref 11.0–15.0)
WBC: 9.4 10*3/uL (ref 3.8–10.8)

## 2016-05-02 ENCOUNTER — Ambulatory Visit (INDEPENDENT_AMBULATORY_CARE_PROVIDER_SITE_OTHER): Payer: Medicare Other | Admitting: Vascular Surgery

## 2016-05-02 ENCOUNTER — Encounter: Payer: Self-pay | Admitting: Vascular Surgery

## 2016-05-02 VITALS — BP 143/85 | HR 76 | Temp 97.4°F | Resp 18 | Ht 72.0 in | Wt 312.0 lb

## 2016-05-02 DIAGNOSIS — I83893 Varicose veins of bilateral lower extremities with other complications: Secondary | ICD-10-CM

## 2016-05-02 DIAGNOSIS — I868 Varicose veins of other specified sites: Secondary | ICD-10-CM

## 2016-05-02 DIAGNOSIS — I83892 Varicose veins of left lower extremities with other complications: Secondary | ICD-10-CM | POA: Insufficient documentation

## 2016-05-02 NOTE — Progress Notes (Signed)
Subjective:     [name], male   DOB: [birth year], 71 y.o.   MRN: [id number]  HPI [age] year old male was referred by Dr. Joni Fears or evaluation of bilateral chronic venous stasis disease.He has severe osteoarthritis of the left knee and needs left knee surgery. He has no history of DVT thrombophlebitis or stasis ulcers or bleeding. He has noted chronic swelling in both lower extremities for the last few years with the left worse than the right and also some severe darkening of the skin on the left leg from the mid lower leg to the foot. He does not wear elastic compression stockings on a regular basis nor elevate his legs. He does develop aching throbbing and burning discomfort in the left leg as the day worsens as well as his severe knee discomfort.  Past Medical History:  Diagnosis Date  . Arthritis   . B12 deficiency   . COPD (chronic obstructive pulmonary disease) ([place]) 01/2011   FVC 47%, FEV1 49%   . 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 Hyperlipidemia   . Hypertension   . Lower extremity edema   . Secondary pulmonary hypertension 01/2011   RV syst pressure 30-40 mm Hg on ECHO     Social History  Substance Use Topics  . Smoking status: Never Smoker  . Smokeless tobacco: Never Used     Comment: occ alcohol  . Alcohol use 0.0 oz/week     Comment: 2-3 drinks occasionally    Family History  Problem Relation Age of Onset  . CAD Mother     MI in her 28s  . Cancer Father     colon and pancreas  . Epilepsy Son     No Known Allergies   Current Outpatient Prescriptions:  .  albuterol (PROVENTIL HFA;VENTOLIN HFA) 108 (90 BASE) MCG/ACT inhaler, Inhale 2 puffs into the lungs every 6 (six) hours as needed for wheezing or shortness of breath., Disp: , Rfl:  .  aspirin 81 MG tablet, Take 81 mg by mouth daily., Disp: , Rfl:  .  betamethasone dipropionate (DIPROLENE) 0.05 % cream, Apply topically 2 (two) times daily., Disp: 30 g, Rfl: 1 .   chlorthalidone (HYGROTON) 25 MG tablet, Take 1 tablet (25 mg total) by mouth daily., Disp: 90 tablet, Rfl: 3 .  Cholecalciferol (VITAMIN D-3) 1000 UNITS CAPS, Take 1,000 Units by mouth., Disp: , Rfl:  .  clindamycin (CLEOCIN) 150 MG capsule, Take 1 capsule (150 mg total) by mouth 3 (three) times daily., Disp: 21 capsule, Rfl: 0 .  diltiazem (CARDIZEM CD) 300 MG 24 hr capsule, Take 1 capsule (300 mg total) by mouth daily., Disp: 90 capsule, Rfl: 3 .  Garlic (GARLIQUE PO), Take 1 tablet by mouth daily., Disp: , Rfl:  .  glucose blood (ONE TOUCH ULTRA TEST) test strip, CHECK BLOOD SUGAR TWICE DAILY, Disp: 100 each, Rfl: 5 .  naproxen sodium (ANAPROX) 220 MG tablet, Take 220 mg by mouth 2 (two) times daily with a meal., Disp: , Rfl:  .  nystatin (MYCOSTATIN/NYSTOP) 100000 UNIT/GM POWD, APPLY TOPICALLY 4 (FOUR) TIMES DAILY., Disp: 15 g, Rfl: 1 .  ONETOUCH DELICA LANCETS FINE MISC, Check blood sugar daily, Disp: 100 each, Rfl: 12 .  predniSONE (DELTASONE) 10 MG tablet, 5 TAB PO DAY 1 4 TAB PO DAY 2 3 TAB PO DAY 3 2 TAB PO DAY 4 1 TAB PO DAY 5, Disp: 15 tablet, Rfl: 0 .  valsartan (DIOVAN)  160 MG tablet, Take 1 tablet (160 mg total) by mouth 2 (two) times daily., Disp: 180 tablet, Rfl: 3  Vitals:   05/02/16 1501  BP: (!) 143/85  Pulse: 76  Resp: 18  Temp: 97.4 F (36.3 C)  TempSrc: Oral  SpO2: 95%  Weight: (!) 312 lb (141.5 kg)  Height: 6' (1.829 m)    Body mass index is 42.31 kg/m.         Review of Systems Brightest with severe left knee pain, COPD, denies chest pain. Has chronic obesity. Denies claudication.    Objective:   Physical Exam BP (!) 143/85 (BP Location: Left Arm, Patient Position: Sitting, Cuff Size: Normal)   Pulse 76   Temp 97.4 F (36.3 C) (Oral)   Resp 18   Ht 6' (1.829 m)   Wt (!) 312 lb (141.5 kg)   SpO2 95%   BMI 42.31 kg/m     Gen.-alert and oriented x3 in no apparent distress obese HEENT normal for age Lungs no rhonchi or wheezing Cardiovascular  regular rhythm no murmurs carotid pulses 3+ palpable no bruits audible Abdomen soft nontender no palpable masses obese, large umbilical hernia-not incarcerated Musculoskeletal free of  major deformities Skin clear -no rashes Neurologic normal Lower extremities 3+ femoral and dorsalis pedis pulses palpable bilaterally with 2+ edema bilaterally Severe hyperpigmentation left leg from midcalf distally with no active ulcer Bulging varicosities in mid thigh on the left and medial calf on the right  Today I reviewed the results of the venous reflux exam performed femoris 09/29/2016 in our office which did reveal severe reflux in the left small saphenous vein and at the right saphenofemoral junction as well as deep reflux on the left and the common femoral and popliteal veins. There was no DVT  Today I performed a bedside SonoSite ultrasound exam of the left leg which confirmed gross reflux in the left small saphenous vein. I visualized gross reflux in the left great saphenous vein in the mid to proximal thigh and a very large caliber vein certainly exceeding 0.5 cm much larger than the right leg.      Assessment:     Painful varicosities and chronic edema left leg with severe skin changes-CEAP3 due to gross reflux left small saphenous vein an   Plan:         #1 long leg elastic compression stockings 20-30 mm gradient #2 elevate legs as much as possible #3 ibuprofen daily on a regular basis for pain #4 return in 3 months-repeat reflux exam left leg upon return to confirm reflux left great saphenous vein and then make formal recommendations Patient will likely need laser ablation left great saphenous and left small saphenous veins depending on results Skin changes are quite severe and impressive on physical exam in the left lower leg

## 2016-05-05 NOTE — Addendum Note (Signed)
Addended by: Lianne Cure A on: 05/05/2016 01:19 PM   Modules accepted: Orders

## 2016-05-14 ENCOUNTER — Other Ambulatory Visit: Payer: Self-pay | Admitting: Medical

## 2016-05-15 NOTE — Telephone Encounter (Signed)
I would like pt to follow up on Wed 8-9 am or 1-2 pm. Pt has gout like symptoms and elevated uric acid on last labs. His kidney function prohibits/ limits use of allopurinol. I don't want to prescribe prednisone again but might have to. Would need to check his sugar in office if I go that route. Considered prescribing colchicine but per epic interacts with one of his current medications. Uloric may be what he needs for prevention but insurance sometimes need prior auth. And don't want him to start uloric just yet during acute phase gout. So please schedule him early 8-9 or 1-2. Minimize wait time.

## 2016-05-15 NOTE — Telephone Encounter (Signed)
Patient last seen on 04/28/16 for hand pain & swelling, had elevated Uric acid level, requesting another round of Prednisone; should I defer to his PCP for F/U and/or would you like to send in colchicine & allopurinol. Please Advise/SLS 03/05

## 2016-05-17 ENCOUNTER — Telehealth: Payer: Self-pay

## 2016-05-17 ENCOUNTER — Ambulatory Visit (INDEPENDENT_AMBULATORY_CARE_PROVIDER_SITE_OTHER): Payer: Medicare Other | Admitting: Medical

## 2016-05-17 VITALS — BP 102/59 | HR 61 | Temp 98.1°F | Ht 72.0 in | Wt 303.6 lb

## 2016-05-17 DIAGNOSIS — M109 Gout, unspecified: Secondary | ICD-10-CM | POA: Diagnosis not present

## 2016-05-17 MED ORDER — FEBUXOSTAT 40 MG PO TABS: 40.0000 mg | ORAL_TABLET | Freq: Every day | ORAL | 0 refills | Status: DC

## 2016-05-17 MED ORDER — PREDNISONE 10 MG PO TABS
ORAL_TABLET | ORAL | 0 refills | Status: DC
Start: 2016-05-17 — End: 2016-08-15

## 2016-05-17 NOTE — Progress Notes (Signed)
Pre visit review using our clinic review tool, if applicable. No additional management support is needed unless otherwise documented below in the visit note. 

## 2016-05-17 NOTE — Telephone Encounter (Signed)
PA initiated via Covermymeds; KEY: [id number]. Awaiting determination.

## 2016-05-17 NOTE — Patient Instructions (Addendum)
For your gout flare will rx prednisone again and then in one week plan to have you start uloric. Other meds are not good options in light of your slight decreased gfr and med interactions.(discussed with pharmacist)  Rx prednisone taper dose. Will send uloric to your pharmacy.(start uloric in one week)   Follow up in 3 weeks or as needed. On follow up plan to repeat uric acid. Might increase dose of uloric after lab review

## 2016-05-17 NOTE — Progress Notes (Signed)
Subjective:    [name], male    DOB: 13-Jan-1946, 71 y.o.   MRN: [id number]  HPI  Pt in for rt hand thumb area pain last 4-5 days(pain worse yesterday but today eased up some). Pt states very painful to use as this his dominant hand. Pt had gout 2 weeks ago on his left 2nd and 3rd digit.Pt had uric acid elevation on last labs. He clinically responded to 5 day course of prednisone. Pt states his sugars were controlled during course of steroids. Last a1c in 5 range.  Pt kidney function has shown mild low gfr. Pt sees a nephrologist regularly.  Review of Systems  Constitutional: Negative for chills, fatigue and fever.  Respiratory: Negative for cough, chest tightness, shortness of breath and wheezing.   Cardiovascular: Negative for chest pain and palpitations.  Genitourinary: Negative for dysuria and flank pain.  Musculoskeletal:       Rt thumb pain.   Skin: Negative for rash.  Neurological: Negative for dizziness and headaches.  Hematological: Negative for adenopathy. Does not bruise/bleed easily.  Psychiatric/Behavioral: Negative for behavioral problems and confusion.    Past Medical History:  Diagnosis Date  . Arthritis   . B12 deficiency   . COPD (chronic obstructive pulmonary disease) ([place]) 01/2011   FVC 47%, FEV1 49%   . 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 Hyperlipidemia   . Hypertension   . Lower extremity edema   . Secondary pulmonary hypertension 01/2011   RV syst pressure 30-40 mm Hg on ECHO      Social History   Social History  . Marital status: Married    Spouse name: N/A  . Number of children: 4  . Years of education: N/A   Occupational History  . retired    Social History Main Topics  . Smoking status: Never Smoker  . Smokeless tobacco: Never Used     Comment: occ alcohol  . Alcohol use 0.0 oz/week     Comment: 2-3 drinks occasionally  . Drug use: No  . Sexual activity: Yes   Other Topics Concern  . Not on  file   Social History Narrative   Mr. Elk lives with his wife in [place]. He is recently retired and relocated to [facility] from [place]. Historically, his medical provider has been [facility] in [place].    Past Surgical History:  Procedure Laterality Date  . 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 Ronelle Nigh at [facility] in [place], [place])  . FINGER SURGERY Right 11   rt middle finger  . TOTAL SHOULDER ARTHROPLASTY Right 01/21/2013   Procedure: SHOULDER HEMI ARTHROPLASTY;  Surgeon: Garald Balding, MD;  Location: [place];  Service: Orthopedics;  Laterality: Right;  . TRANSTHORACIC ECHOCARDIOGRAM  01/2011   EF 55-60%, mild asymmetric LVH, elevated RV systolic pressure  . VASECTOMY      Family History  Problem Relation Age of Onset  . CAD Mother     MI in her 50s  . Cancer Father     colon and pancreas  . Epilepsy Son     No Known Allergies  Current Outpatient Prescriptions on File Prior to Visit  Medication Sig Dispense Refill  . albuterol (PROVENTIL HFA;VENTOLIN HFA) 108 (90 BASE) MCG/ACT inhaler Inhale 2 puffs into the lungs every 6 (six) hours as needed for wheezing or shortness of breath.    Marland Kitchen  aspirin 81 MG tablet Take 81 mg by mouth daily.    . betamethasone dipropionate (DIPROLENE) 0.05 % cream Apply topically 2 (two) times daily. 30 g 1  . chlorthalidone (HYGROTON) 25 MG tablet Take 1 tablet (25 mg total) by mouth daily. 90 tablet 3  . Cholecalciferol (VITAMIN D-3) 1000 UNITS CAPS Take 1,000 Units by mouth.    . diltiazem (CARDIZEM CD) 300 MG 24 hr capsule Take 1 capsule (300 mg total) by mouth daily. 90 capsule 3  . Garlic (GARLIQUE PO) Take 1 tablet by mouth daily.    Marland Kitchen glucose blood (ONE TOUCH ULTRA TEST) test strip CHECK BLOOD SUGAR TWICE DAILY 100 each 5  . nystatin (MYCOSTATIN/NYSTOP) 100000 UNIT/GM POWD APPLY TOPICALLY 4 (FOUR) TIMES DAILY. 15 g 1  . ONETOUCH DELICA LANCETS  FINE MISC Check blood sugar daily 100 each 12  . valsartan (DIOVAN) 160 MG tablet Take 1 tablet (160 mg total) by mouth 2 (two) times daily. 180 tablet 3   No current facility-administered medications on file prior to visit.     BP (!) 102/59 (BP Location: Right Arm, Cuff Size: Large)   Pulse 61   Temp 98.1 F (36.7 C) (Oral)   Ht 6' (1.829 m)   Wt (!) 303 lb 9.6 oz (137.7 kg)   SpO2 98%   BMI 41.18 kg/m       Objective:   Physical Exam  General- No acute distress. Pleasant patient.  Lungs- Clear, even and unlabored. Heart- regular rate and rhythm. Neurologic- CNII- XII grossly intact.  Rt hand- base of his thumb mild swollen,  Faint warm and faint  tender.      Assessment & Plan:  For your gout flare will rx prednisone again and then in one week plan to have you start uloric. Other meds are not good options in light of your  decreased gfr and med interactions.(discussed with pharmacist)  Rx prednisone taper dose. Will send uloric to your pharmacy.   Follow up in 3 weeks or as needed. On follow up plan to repeat uric acid. Might increase dose of uloric after lab review  Advised watch sugar intake presently.     Laurinda Carreno, Percell Miller, PA-C

## 2016-05-22 NOTE — Telephone Encounter (Signed)
Received Approval from [employer] for PA on Uloric begin on 05/17/16, end date "Until Further Notice"/SLS 03/12

## 2016-06-08 ENCOUNTER — Ambulatory Visit: Payer: Medicare Other | Admitting: Family Medicine

## 2016-06-13 DIAGNOSIS — R972 Elevated prostate specific antigen [PSA]: Secondary | ICD-10-CM | POA: Diagnosis not present

## 2016-07-03 DIAGNOSIS — R972 Elevated prostate specific antigen [PSA]: Secondary | ICD-10-CM | POA: Diagnosis not present

## 2016-07-03 DIAGNOSIS — N281 Cyst of kidney, acquired: Secondary | ICD-10-CM | POA: Diagnosis not present

## 2016-07-03 DIAGNOSIS — R3129 Other microscopic hematuria: Secondary | ICD-10-CM | POA: Diagnosis not present

## 2016-07-31 ENCOUNTER — Ambulatory Visit (INDEPENDENT_AMBULATORY_CARE_PROVIDER_SITE_OTHER): Payer: Medicare Other | Admitting: Vascular Surgery

## 2016-07-31 ENCOUNTER — Encounter: Payer: Self-pay | Admitting: Vascular Surgery

## 2016-07-31 ENCOUNTER — Ambulatory Visit (HOSPITAL_COMMUNITY)
Admission: RE | Admit: 2016-07-31 | Discharge: 2016-07-31 | Disposition: A | Discharge status: Home / Self Care | Payer: Medicare Other | Source: Ambulatory Visit | Attending: Vascular Surgery | Admitting: Vascular Surgery

## 2016-07-31 VITALS — BP 135/79 | HR 64 | Temp 98.4°F | Resp 18 | Ht 72.0 in | Wt 314.7 lb

## 2016-07-31 DIAGNOSIS — I83892 Varicose veins of left lower extremities with other complications: Secondary | ICD-10-CM

## 2016-07-31 DIAGNOSIS — I83893 Varicose veins of bilateral lower extremities with other complications: Secondary | ICD-10-CM | POA: Diagnosis not present

## 2016-07-31 DIAGNOSIS — I872 Venous insufficiency (chronic) (peripheral): Secondary | ICD-10-CM | POA: Insufficient documentation

## 2016-07-31 NOTE — Progress Notes (Signed)
Subjective:     [name], male   DOB: 22-Jun-1945, 71 y.o.   MRN: [id number]  HPI This 71 year old male returns 3 months post-evaluation for severe skin changes and swelling in the left greater than right leg due to gross reflux in the left great and small saphenous veins. He has tried long leg elastic compression stockings 20-30 millimeter gradient as well as elevation and ibuprofen and he continues to have aching throbbing and burning discomfort as well as severe darkening and thickening of the scan particularly in the left lower leg. He has no history of DVT thrombophlebitis stasis ulcers or bleeding. He does need a left and right knee replacement which is on hold until his venous disease is treated.  Past Medical History:  Diagnosis Date  . Arthritis   . B12 deficiency   . COPD (chronic obstructive pulmonary disease) ([place]) 01/2011   FVC 47%, FEV1 49%   . 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 Hyperlipidemia   . Hypertension   . Lower extremity edema   . Secondary pulmonary hypertension 01/2011   RV syst pressure 30-40 mm Hg on ECHO     Social History  Substance Use Topics  . Smoking status: Never Smoker  . Smokeless tobacco: Never Used     Comment: occ alcohol  . Alcohol use 0.0 oz/week     Comment: 2-3 drinks occasionally    Family History  Problem Relation Age of Onset  . CAD Mother        MI in her 4s  . Cancer Father        colon and pancreas  . Epilepsy Son     No Known Allergies   Current Outpatient Prescriptions:  .  albuterol (PROVENTIL HFA;VENTOLIN HFA) 108 (90 BASE) MCG/ACT inhaler, Inhale 2 puffs into the lungs every 6 (six) hours as needed for wheezing or shortness of breath., Disp: , Rfl:  .  aspirin 81 MG tablet, Take 81 mg by mouth daily., Disp: , Rfl:  .  Cholecalciferol (VITAMIN D-3) 1000 UNITS CAPS, Take 1,000 Units by mouth., Disp: , Rfl:  .  diltiazem (CARDIZEM CD) 300 MG 24 hr capsule, Take 1 capsule (300 mg total)  by mouth daily., Disp: 90 capsule, Rfl: 3 .  Garlic (GARLIQUE PO), Take 1 tablet by mouth daily., Disp: , Rfl:  .  glucose blood (ONE TOUCH ULTRA TEST) test strip, CHECK BLOOD SUGAR TWICE DAILY, Disp: 100 each, Rfl: 5 .  ONETOUCH DELICA LANCETS FINE MISC, Check blood sugar daily, Disp: 100 each, Rfl: 12 .  valsartan (DIOVAN) 160 MG tablet, Take 1 tablet (160 mg total) by mouth 2 (two) times daily., Disp: 180 tablet, Rfl: 3 .  betamethasone dipropionate (DIPROLENE) 0.05 % cream, Apply topically 2 (two) times daily. (Patient not taking: Reported on 07/31/2016), Disp: 30 g, Rfl: 1 .  chlorthalidone (HYGROTON) 25 MG tablet, Take 1 tablet (25 mg total) by mouth daily. (Patient not taking: Reported on 07/31/2016), Disp: 90 tablet, Rfl: 3 .  febuxostat (ULORIC) 40 MG tablet, Take 1 tablet (40 mg total) by mouth daily. (Patient not taking: Reported on 07/31/2016), Disp: 28 tablet, Rfl: 0 .  nystatin (MYCOSTATIN/NYSTOP) 100000 UNIT/GM POWD, APPLY TOPICALLY 4 (FOUR) TIMES DAILY. (Patient not taking: Reported on 07/31/2016), Disp: 15 g, Rfl: 1 .  predniSONE (DELTASONE) 10 MG tablet, 6 TAB PO DAY 1 5 TAB PO DAY 2 4 TAB PO DAY 3 3 TAB PO DAY 4 2 TAB  PO DAY 5 1 TAB PO DAY 6 (Patient not taking: Reported on 07/31/2016), Disp: 21 tablet, Rfl: 0  Vitals:   07/31/16 1252  BP: 135/79  Pulse: 64  Resp: 18  Temp: 98.4 F (36.9 C)  TempSrc: Oral  SpO2: 92%  Weight: (!) 314 lb 11.2 oz (142.7 kg)  Height: 6' (1.829 m)    Body mass index is 42.68 kg/m.         Review of Systems Has history of COPD, hypertension, hyperlipidemia, osteoarthritis as mentioned above.    Objective:   Physical Exam BP 135/79 (BP Location: Left Arm, Patient Position: Sitting, Cuff Size: Large)   Pulse 64   Temp 98.4 F (36.9 C) (Oral)   Resp 18   Ht 6' (1.829 m)   Wt (!) 314 lb 11.2 oz (142.7 kg)   SpO2 92%   BMI 42.68 kg/m   Gen. obese male no apparent distress alert and oriented 3 Lungs no rhonchi or wheezing Left  leg with severe hyperpigmentation midcalf distally with 1-2+ edema. No active ulcer noted. Bulging varicosities in the medial thigh of the left leg and medial calf.  Today I ordered a venous duplex exam the left leg which I reviewed and interpreted. There is no DVT. There is gross reflux and large caliber left great saphenous vein and a left small saphenous vein from the midcalf to the saphenofemoral  junction which is enlarged.     Assessment:     Severe skin changes and swelling left leg-CEAP3 due to gross reflux and large caliber left great and left small saphenous veins as well as painful varicosities in left medial thigh. Symptoms are resistant to conservative measures including long-leg elastic compression stockings 20-30 millimeter gradient, elevation, and ibuprofen and affecting patient's daily living. Patient needs bilateral knee replacements which cannot be performed until venous disease is treated. Both veins are affecting the pain and swelling and skin changes    Plan:     Patient needs #1 laser ablation left great saphenous vein followed by #2 laser ablation left small saphenous vein. He would then have a three-month waiting period to determine if stab phlebectomy of residual varicosities in the left leg would be indicated We'll proceed with precertification to perform this in the near future and hopefully allow patient to proceed with his knee surgery in the near future as well.

## 2016-08-01 ENCOUNTER — Ambulatory Visit: Payer: Medicare Other | Admitting: Vascular Surgery

## 2016-08-01 ENCOUNTER — Encounter (HOSPITAL_COMMUNITY): Payer: Medicare Other

## 2016-08-02 ENCOUNTER — Other Ambulatory Visit: Payer: Self-pay | Admitting: *Deleted

## 2016-08-02 DIAGNOSIS — E785 Hyperlipidemia, unspecified: Secondary | ICD-10-CM | POA: Diagnosis not present

## 2016-08-02 DIAGNOSIS — N183 Chronic kidney disease, stage 3 (moderate): Secondary | ICD-10-CM | POA: Diagnosis not present

## 2016-08-02 DIAGNOSIS — R3129 Other microscopic hematuria: Secondary | ICD-10-CM | POA: Diagnosis not present

## 2016-08-02 DIAGNOSIS — I83892 Varicose veins of left lower extremities with other complications: Secondary | ICD-10-CM

## 2016-08-02 DIAGNOSIS — N179 Acute kidney failure, unspecified: Secondary | ICD-10-CM | POA: Diagnosis not present

## 2016-08-02 DIAGNOSIS — I10 Essential (primary) hypertension: Secondary | ICD-10-CM | POA: Diagnosis not present

## 2016-08-02 DIAGNOSIS — R809 Proteinuria, unspecified: Secondary | ICD-10-CM | POA: Diagnosis not present

## 2016-08-02 DIAGNOSIS — E1129 Type 2 diabetes mellitus with other diabetic kidney complication: Secondary | ICD-10-CM | POA: Diagnosis not present

## 2016-08-10 DIAGNOSIS — H2513 Age-related nuclear cataract, bilateral: Secondary | ICD-10-CM | POA: Diagnosis not present

## 2016-08-10 DIAGNOSIS — H524 Presbyopia: Secondary | ICD-10-CM | POA: Diagnosis not present

## 2016-08-10 DIAGNOSIS — E119 Type 2 diabetes mellitus without complications: Secondary | ICD-10-CM | POA: Diagnosis not present

## 2016-08-10 DIAGNOSIS — H5203 Hypermetropia, bilateral: Secondary | ICD-10-CM | POA: Diagnosis not present

## 2016-08-10 DIAGNOSIS — H04123 Dry eye syndrome of bilateral lacrimal glands: Secondary | ICD-10-CM | POA: Diagnosis not present

## 2016-08-10 DIAGNOSIS — H52223 Regular astigmatism, bilateral: Secondary | ICD-10-CM | POA: Diagnosis not present

## 2016-08-10 DIAGNOSIS — H35033 Hypertensive retinopathy, bilateral: Secondary | ICD-10-CM | POA: Diagnosis not present

## 2016-08-14 ENCOUNTER — Telehealth: Payer: Self-pay | Admitting: Family Medicine

## 2016-08-14 NOTE — Telephone Encounter (Signed)
He should be seen.

## 2016-08-14 NOTE — Telephone Encounter (Signed)
Patient notified and schedule appt with Dr. Etter Sjogren on 08/15/16 at 6:15 PM for a tick bite/fever

## 2016-08-14 NOTE — Telephone Encounter (Signed)
Caller name:Raylee Withem Relationship to patient:self Can be reached:[phone] Pharmacy:  Reason for call:Requesting to speak with nurse, patient was at blood bank this morning. Was told he had a temp of 99.5(he was turned away), pt states he was bit by a tick over the weekend. Patient states he feels fine, wants to know if he should be seen or have lab work. Please advise.

## 2016-08-15 ENCOUNTER — Encounter: Payer: Self-pay | Admitting: Family Medicine

## 2016-08-15 ENCOUNTER — Ambulatory Visit (INDEPENDENT_AMBULATORY_CARE_PROVIDER_SITE_OTHER): Payer: Medicare Other | Admitting: Family Medicine

## 2016-08-15 VITALS — BP 120/80 | HR 61 | Temp 98.3°F | Resp 16 | Ht 72.0 in | Wt 312.4 lb

## 2016-08-15 DIAGNOSIS — W57XXXA Bitten or stung by nonvenomous insect and other nonvenomous arthropods, initial encounter: Secondary | ICD-10-CM

## 2016-08-15 DIAGNOSIS — S30861A Insect bite (nonvenomous) of abdominal wall, initial encounter: Secondary | ICD-10-CM | POA: Diagnosis not present

## 2016-08-15 MED ORDER — DOXYCYCLINE HYCLATE 100 MG PO TABS: 100.0000 mg | ORAL_TABLET | Freq: Two times a day (BID) | ORAL | 0 refills | Status: DC

## 2016-08-15 NOTE — Patient Instructions (Signed)
Tick Bite Information Introduction Ticks are insects that attach themselves to the skin. There are many types of ticks. Common types include wood ticks and deer ticks. Sometimes, ticks carry diseases that can make a person very ill. The most common places for ticks to attach themselves are the scalp, neck, armpits, waist, and groin. HOW CAN YOU PREVENT TICK BITES? Take these steps to help prevent tick bites when you are outdoors:  Wear long sleeves and long pants.  Wear white clothes so you can see ticks more easily.  Tuck your pant legs into your socks.  If walking on a trail, stay in the middle of the trail to avoid brushing against bushes.  Avoid walking through areas with long grass.  Put bug spray on all skin that is showing and along boot tops, pant legs, and sleeve cuffs.  Check clothes, hair, and skin often and before going inside.  Brush off any ticks that are not attached.  Take a shower or bath as soon as possible after being outdoors.  HOW SHOULD YOU REMOVE A TICK? Ticks should be removed as soon as possible to help prevent diseases. 1. If latex gloves are available, put them on before trying to remove a tick. 2. Use tweezers to grasp the tick as close to the skin as possible. You may also use curved forceps or a tick removal tool. Grasp the tick as close to its head as possible. Avoid grasping the tick on its body. 3. Pull gently upward until the tick lets go. Do not twist the tick or jerk it suddenly. This may break off the tick's head or mouth parts. 4. Do not squeeze or crush the tick's body. This could force disease-carrying fluids from the tick into your body. 5. After the tick is removed, wash the bite area and your hands with soap and water or alcohol. 6. Apply a small amount of antiseptic cream or ointment to the bite site. 7. Wash any tools that were used.  Do not try to remove a tick by applying a hot match, petroleum jelly, or fingernail polish to the tick.  These methods do not work. They may also increase the chances of disease being spread from the tick bite. WHEN SHOULD YOU SEEK HELP? Contact your health care provider if you are unable to remove a tick or if a part of the tick breaks off in the skin. After a tick bite, you need to watch for signs and symptoms of diseases that can be spread by ticks. Contact your health care provider if you develop any of the following:  Fever.  Rash.  Redness and puffiness (swelling) in the area of the tick bite.  Tender, puffy lymph glands.  Watery poop (diarrhea).  Weight loss.  Cough.  Feeling more tired than normal (fatigue).  Muscle, joint, or bone pain.  Belly (abdominal) pain.  Headache.  Change in your level of consciousness.  Trouble walking or moving your legs.  Loss of feeling (numbness) in the legs.  Loss of movement (paralysis).  Shortness of breath.  Confusion.  Throwing up (vomiting) many times.  This information is not intended to replace advice given to you by your health care provider. Make sure you discuss any questions you have with your health care provider. Document Released: 05/24/2009 Document Revised: 08/05/2015 Document Reviewed: 08/07/2012 Elsevier Interactive Patient Education  2018 Elsevier Inc.  

## 2016-08-15 NOTE — Progress Notes (Signed)
[name], male   DOB: Oct 16, 1945, 71 y.o.   MRN: [id number]     Subjective:  I acted as a Education administrator for Dr. Carollee Daniels.  Travis Daniels, Travis Daniels   [name], male    DOB: 1946/02/27, 71 y.o.   MRN: [id number]  Chief Complaint  Patient presents with  . Tick Removal    HPI  Patient is in today for tick bite on right upper thigh that was removed Saturday.  He went to go to have blood drawn for platlets yesterday and they stated that his temperature was 99.6.  He told them that he pulled off a tick and they told him that he needed to be checked out.    Patient Care Team: Travis Daniels, Travis Apa, DO as PCP - General (Family Medicine) Travis Heinz, MD as Consulting Physician (Nephrology)   Past Medical History:  Diagnosis Date  . Arthritis   . B12 deficiency   . COPD (chronic obstructive pulmonary disease) ([place]) 01/2011   FVC 47%, FEV1 49%   . 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 Hyperlipidemia   . Hypertension   . Lower extremity edema   . Secondary pulmonary hypertension 01/2011   RV syst pressure 30-40 mm Hg on ECHO     Past Surgical History:  Procedure Laterality Date  . 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 Ronelle Daniels at [facility] in [place], [place])  . FINGER SURGERY Right 11   rt middle finger  . TOTAL SHOULDER ARTHROPLASTY Right 01/21/2013   Procedure: SHOULDER HEMI ARTHROPLASTY;  Surgeon: Travis Balding, MD;  Location: [place];  Service: Orthopedics;  Laterality: Right;  . TRANSTHORACIC ECHOCARDIOGRAM  01/2011   EF 55-60%, mild asymmetric LVH, elevated RV systolic pressure  . VASECTOMY      Family History  Problem Relation Age of Onset  . CAD Mother        MI in her 67s  . Cancer Father        colon and pancreas  . Epilepsy Son     Social History   Social History  . Marital status: Married    Spouse name: N/A  .  Number of children: 4  . Years of education: N/A   Occupational History  . retired    Social History Main Topics  . Smoking status: Never Smoker  . Smokeless tobacco: Never Used     Comment: occ alcohol  . Alcohol use 0.0 oz/week     Comment: 2-3 drinks occasionally  . Drug use: No  . Sexual activity: Yes   Other Topics Concern  . Not on file   Social History Narrative   Travis Daniels lives with his wife in [place]. He is recently retired and relocated to [facility] from [place]. Historically, his medical provider has been [facility] in [place].    Outpatient Medications Prior to Visit  Medication Sig Dispense Refill  . albuterol (PROVENTIL HFA;VENTOLIN HFA) 108 (90 BASE) MCG/ACT inhaler Inhale 2 puffs into the lungs every 6 (six) hours as needed for wheezing or shortness of breath.    Marland Kitchen aspirin 81 MG tablet Take 81 mg by mouth daily.    . betamethasone dipropionate (DIPROLENE) 0.05 % cream Apply topically 2 (two) times daily. 30 g 1  . chlorthalidone (HYGROTON) 25 MG tablet Take 1 tablet (25 mg total) by mouth daily. 90 tablet 3  .  diltiazem (CARDIZEM CD) 300 MG 24 hr capsule Take 1 capsule (300 mg total) by mouth daily. 90 capsule 3  . Garlic (GARLIQUE PO) Take 1 tablet by mouth daily.    Marland Kitchen glucose blood (ONE TOUCH ULTRA TEST) test strip CHECK BLOOD SUGAR TWICE DAILY 100 each 5  . nystatin (MYCOSTATIN/NYSTOP) 100000 UNIT/GM POWD APPLY TOPICALLY 4 (FOUR) TIMES DAILY. 15 g 1  . ONETOUCH DELICA LANCETS FINE MISC Check blood sugar daily 100 each 12  . valsartan (DIOVAN) 160 MG tablet Take 1 tablet (160 mg total) by mouth 2 (two) times daily. 180 tablet 3  . Cholecalciferol (VITAMIN D-3) 1000 UNITS CAPS Take 1,000 Units by mouth.    . febuxostat (ULORIC) 40 MG tablet Take 1 tablet (40 mg total) by mouth daily. (Patient not taking: Reported on 07/31/2016) 28 tablet 0  . predniSONE (DELTASONE) 10 MG tablet 6 TAB PO DAY 1 5 TAB PO DAY 2 4 TAB PO DAY 3 3 TAB PO DAY 4 2 TAB PO DAY 5 1 TAB PO DAY 6  (Patient not taking: Reported on 07/31/2016) 21 tablet 0   No facility-administered medications prior to visit.     No Known Allergies  Review of Systems  Constitutional: Negative for fever and malaise/fatigue.  HENT: Negative for congestion.   Eyes: Negative for blurred vision.  Respiratory: Negative for cough and shortness of breath.   Cardiovascular: Negative for chest pain, palpitations and leg swelling.  Gastrointestinal: Negative for vomiting.  Musculoskeletal: Negative for back pain.  Skin: Negative for rash.  Neurological: Negative for loss of consciousness and headaches.       Objective:    Physical Exam  Constitutional: He is oriented to person, place, and time. Vital signs are normal. He appears well-developed and well-nourished. He is sleeping.  HENT:  Head: Normocephalic and atraumatic.  Mouth/Throat: Oropharynx is clear and moist.  Eyes: EOM are normal. Pupils are equal, round, and reactive to light.  Neck: Normal range of motion. Neck supple. No thyromegaly present.  Cardiovascular: Normal rate and regular rhythm.   No murmur heard. Pulmonary/Chest: Effort normal and breath sounds normal. No respiratory distress. He has no wheezes. He has no rales. He exhibits no tenderness.  Musculoskeletal: He exhibits no edema or tenderness.  Neurological: He is alert and oriented to person, place, and time.  Skin: Skin is warm and dry.     Psychiatric: He has a normal mood and affect. His behavior is normal. Judgment and thought content normal.  Nursing note and vitals reviewed.   BP 120/80 (BP Location: Right Arm, Cuff Size: Large)   Pulse 61   Temp 98.3 F (36.8 C) (Oral)   Resp 16   Ht 6' (1.829 m)   Wt (!) 312 lb 6.4 oz (141.7 kg)   SpO2 94%   BMI 42.37 kg/m  Wt Readings from Last 3 Encounters:  08/15/16 (!) 312 lb 6.4 oz (141.7 kg)  07/31/16 (!) 314 lb 11.2 oz (142.7 kg)  05/17/16 (!) 303 lb 9.6 oz (137.7 kg)   BP Readings from Last 3 Encounters:    08/15/16 120/80  07/31/16 135/79  05/17/16 (!) 102/59     Immunization History  Administered Date(s) Administered  . Influenza Split 11/11/2012, 12/11/2012  . Influenza, High Dose Seasonal PF 12/03/2014  . Influenza,inj,Quad PF,36+ Mos 11/21/2013  . Influenza-Unspecified 01/05/2016  . Pneumococcal Conjugate-13 11/12/2011  . Pneumococcal Polysaccharide-23 09/30/2012  . Tdap 03/25/2013  . Zoster 10/01/2012    Health Maintenance  Topic Date Due  .  FOOT EXAM  05/05/2015  . HEMOGLOBIN A1C  02/26/2016  . OPHTHALMOLOGY EXAM  08/10/2016  . INFLUENZA VACCINE  10/11/2016  . TETANUS/TDAP  03/26/2023  . COLONOSCOPY  11/24/2024  . Hepatitis C Screening  Completed  . PNA vac Low Risk Adult  Completed    Lab Results  Component Value Date   WBC 9.4 04/28/2016   HGB 14.2 04/28/2016   HCT 42.3 04/28/2016   PLT 194 04/28/2016   GLUCOSE 79 03/09/2016   CHOL 160 03/09/2016   TRIG 159.0 (H) 03/09/2016   HDL 47.60 03/09/2016   LDLCALC 81 03/09/2016   ALT 18 03/09/2016   AST 19 03/09/2016   NA 143 03/09/2016   K 3.9 03/09/2016   CL 100 03/09/2016   CREATININE 1.76 (H) 03/09/2016   BUN 23 03/09/2016   CO2 36 (H) 03/09/2016   TSH 0.900 02/26/2015   PSA 4.96 (H) 02/26/2015   INR 0.95 01/14/2013   HGBA1C 5.3 08/27/2015   MICROALBUR 69.1 02/26/2015    Lab Results  Component Value Date   TSH 0.900 02/26/2015   Lab Results  Component Value Date   WBC 9.4 04/28/2016   HGB 14.2 04/28/2016   HCT 42.3 04/28/2016   MCV 84.8 04/28/2016   PLT 194 04/28/2016   Lab Results  Component Value Date   NA 143 03/09/2016   K 3.9 03/09/2016   CO2 36 (H) 03/09/2016   GLUCOSE 79 03/09/2016   BUN 23 03/09/2016   CREATININE 1.76 (H) 03/09/2016   BILITOT 0.5 03/09/2016   ALKPHOS 71 03/09/2016   AST 19 03/09/2016   ALT 18 03/09/2016   PROT 6.9 03/09/2016   ALBUMIN 3.8 03/09/2016   CALCIUM 9.8 03/09/2016   GFR 40.79 (L) 03/09/2016   Lab Results  Component Value Date   CHOL 160  03/09/2016   Lab Results  Component Value Date   HDL 47.60 03/09/2016   Lab Results  Component Value Date   LDLCALC 81 03/09/2016   Lab Results  Component Value Date   TRIG 159.0 (H) 03/09/2016   Lab Results  Component Value Date   CHOLHDL 3 03/09/2016   Lab Results  Component Value Date   HGBA1C 5.3 08/27/2015         Assessment & Plan:   Problem List Items Addressed This Visit    None    Visit Diagnoses    Tick bite of groin, initial encounter    -  Primary   Relevant Medications   doxycycline (VIBRA-TABS) 100 MG tablet    doubt lyme/ rmsf-- no fever today Doxy x 10 days Lab closed-- will check titres if symptoms develop ie fever > 100.4 ,, rash , myalgias   I have discontinued Mr. Bhardwaj Vitamin D-3, predniSONE, and febuxostat. I am also having him start on doxycycline. Additionally, I am having him maintain his aspirin, albuterol, ONETOUCH DELICA LANCETS FINE, diltiazem, betamethasone dipropionate, Garlic (GARLIQUE PO), nystatin, chlorthalidone, valsartan, and glucose blood.  Meds ordered this encounter  Medications  . doxycycline (VIBRA-TABS) 100 MG tablet    Sig: Take 1 tablet (100 mg total) by mouth 2 (two) times daily.    Dispense:  20 tablet    Refill:  0    CMA served as scribe during this visit. History, Physical and Plan performed by medical provider. Documentation and orders reviewed and attested to.  Ann Held, DO

## 2016-08-23 ENCOUNTER — Encounter: Payer: Self-pay | Admitting: Vascular Surgery

## 2016-08-28 DIAGNOSIS — R972 Elevated prostate specific antigen [PSA]: Secondary | ICD-10-CM | POA: Diagnosis not present

## 2016-08-28 DIAGNOSIS — C61 Malignant neoplasm of prostate: Secondary | ICD-10-CM | POA: Diagnosis not present

## 2016-08-31 ENCOUNTER — Encounter (HOSPITAL_COMMUNITY): Payer: Self-pay

## 2016-08-31 ENCOUNTER — Inpatient Hospital Stay (HOSPITAL_COMMUNITY)
Admission: EM | Admit: 2016-08-31 | Discharge: 2016-09-04 | DRG: 872 | Disposition: A | Discharge status: Home / Self Care | Payer: Medicare Other | Attending: Internal Medicine | Admitting: Internal Medicine

## 2016-08-31 ENCOUNTER — Ambulatory Visit (INDEPENDENT_AMBULATORY_CARE_PROVIDER_SITE_OTHER): Payer: Medicare Other | Admitting: Family Medicine

## 2016-08-31 ENCOUNTER — Emergency Department (HOSPITAL_COMMUNITY): Payer: Medicare Other

## 2016-08-31 ENCOUNTER — Encounter: Payer: Self-pay | Admitting: Vascular Surgery

## 2016-08-31 VITALS — BP 126/76 | HR 93 | Temp 102.1°F | Resp 16

## 2016-08-31 DIAGNOSIS — R195 Other fecal abnormalities: Secondary | ICD-10-CM

## 2016-08-31 DIAGNOSIS — I129 Hypertensive chronic kidney disease with stage 1 through stage 4 chronic kidney disease, or unspecified chronic kidney disease: Secondary | ICD-10-CM | POA: Diagnosis present

## 2016-08-31 DIAGNOSIS — Z96611 Presence of right artificial shoulder joint: Secondary | ICD-10-CM | POA: Diagnosis present

## 2016-08-31 DIAGNOSIS — I872 Venous insufficiency (chronic) (peripheral): Secondary | ICD-10-CM | POA: Diagnosis present

## 2016-08-31 DIAGNOSIS — Z79899 Other long term (current) drug therapy: Secondary | ICD-10-CM

## 2016-08-31 DIAGNOSIS — E1122 Type 2 diabetes mellitus with diabetic chronic kidney disease: Secondary | ICD-10-CM | POA: Diagnosis present

## 2016-08-31 DIAGNOSIS — Z6841 Body Mass Index (BMI) 40.0 and over, adult: Secondary | ICD-10-CM

## 2016-08-31 DIAGNOSIS — E1165 Type 2 diabetes mellitus with hyperglycemia: Secondary | ICD-10-CM | POA: Diagnosis present

## 2016-08-31 DIAGNOSIS — Z7982 Long term (current) use of aspirin: Secondary | ICD-10-CM

## 2016-08-31 DIAGNOSIS — Z8249 Family history of ischemic heart disease and other diseases of the circulatory system: Secondary | ICD-10-CM | POA: Diagnosis not present

## 2016-08-31 DIAGNOSIS — L03115 Cellulitis of right lower limb: Secondary | ICD-10-CM | POA: Diagnosis not present

## 2016-08-31 DIAGNOSIS — M109 Gout, unspecified: Secondary | ICD-10-CM

## 2016-08-31 DIAGNOSIS — R972 Elevated prostate specific antigen [PSA]: Secondary | ICD-10-CM

## 2016-08-31 DIAGNOSIS — W57XXXA Bitten or stung by nonvenomous insect and other nonvenomous arthropods, initial encounter: Secondary | ICD-10-CM | POA: Diagnosis present

## 2016-08-31 DIAGNOSIS — E876 Hypokalemia: Secondary | ICD-10-CM | POA: Diagnosis not present

## 2016-08-31 DIAGNOSIS — Z86010 Personal history of colonic polyps: Secondary | ICD-10-CM

## 2016-08-31 DIAGNOSIS — M7989 Other specified soft tissue disorders: Secondary | ICD-10-CM | POA: Diagnosis not present

## 2016-08-31 DIAGNOSIS — A419 Sepsis, unspecified organism: Principal | ICD-10-CM

## 2016-08-31 DIAGNOSIS — E119 Type 2 diabetes mellitus without complications: Secondary | ICD-10-CM | POA: Diagnosis not present

## 2016-08-31 DIAGNOSIS — L0390 Cellulitis, unspecified: Secondary | ICD-10-CM | POA: Diagnosis present

## 2016-08-31 DIAGNOSIS — N183 Chronic kidney disease, stage 3 (moderate): Secondary | ICD-10-CM | POA: Diagnosis present

## 2016-08-31 DIAGNOSIS — N179 Acute kidney failure, unspecified: Secondary | ICD-10-CM

## 2016-08-31 DIAGNOSIS — N189 Chronic kidney disease, unspecified: Secondary | ICD-10-CM

## 2016-08-31 DIAGNOSIS — Z9889 Other specified postprocedural states: Secondary | ICD-10-CM | POA: Insufficient documentation

## 2016-08-31 DIAGNOSIS — J449 Chronic obstructive pulmonary disease, unspecified: Secondary | ICD-10-CM | POA: Diagnosis not present

## 2016-08-31 DIAGNOSIS — I10 Essential (primary) hypertension: Secondary | ICD-10-CM | POA: Diagnosis not present

## 2016-08-31 DIAGNOSIS — R319 Hematuria, unspecified: Secondary | ICD-10-CM | POA: Diagnosis not present

## 2016-08-31 DIAGNOSIS — E785 Hyperlipidemia, unspecified: Secondary | ICD-10-CM | POA: Diagnosis not present

## 2016-08-31 LAB — COMPREHENSIVE METABOLIC PANEL
ALK PHOS: 70 U/L (ref 38–126)
ALT: 16 U/L — AB (ref 17–63)
AST: 21 U/L (ref 15–41)
Albumin: 4 g/dL (ref 3.5–5.0)
Anion gap: 11 (ref 5–15)
BILIRUBIN TOTAL: 3 mg/dL — AB (ref 0.3–1.2)
BUN: 25 mg/dL — ABNORMAL HIGH (ref 6–20)
CO2: 30 mmol/L (ref 22–32)
CREATININE: 1.89 mg/dL — AB (ref 0.61–1.24)
Calcium: 9.5 mg/dL (ref 8.9–10.3)
Chloride: 96 mmol/L — ABNORMAL LOW (ref 101–111)
GFR, EST AFRICAN AMERICAN: 40 mL/min — AB (ref >60)
GFR, EST NON AFRICAN AMERICAN: 34 mL/min — AB (ref >60)
Glucose, Bld: 118 mg/dL — ABNORMAL HIGH (ref 65–99)
Potassium: 3.7 mmol/L (ref 3.5–5.1)
Sodium: 137 mmol/L (ref 135–145)
Total Protein: 7.9 g/dL (ref 6.5–8.1)

## 2016-08-31 LAB — URINALYSIS, ROUTINE W REFLEX MICROSCOPIC
BACTERIA UA: NONE SEEN
Bilirubin Urine: NEGATIVE
Glucose, UA: NEGATIVE mg/dL
Ketones, ur: NEGATIVE mg/dL
Leukocytes, UA: NEGATIVE
NITRITE: NEGATIVE
Protein, ur: 30 mg/dL — AB
SPECIFIC GRAVITY, URINE: 1.016 (ref 1.005–1.030)
SQUAMOUS EPITHELIAL / LPF: NONE SEEN
pH: 5 (ref 5.0–8.0)

## 2016-08-31 LAB — CBC WITH DIFFERENTIAL/PLATELET
Basophils Absolute: 0 10*3/uL (ref 0.0–0.1)
Basophils Relative: 0 %
EOS ABS: 0 10*3/uL (ref 0.0–0.7)
EOS PCT: 0 %
HCT: 44.1 % (ref 39.0–52.0)
Hemoglobin: 14.8 g/dL (ref 13.0–17.0)
LYMPHS ABS: 1.7 10*3/uL (ref 0.7–4.0)
Lymphocytes Relative: 10 %
MCH: 28.4 pg (ref 26.0–34.0)
MCHC: 33.6 g/dL (ref 30.0–36.0)
MCV: 84.6 fL (ref 78.0–100.0)
Monocytes Absolute: 1.6 10*3/uL — ABNORMAL HIGH (ref 0.1–1.0)
Monocytes Relative: 10 %
Neutro Abs: 13.5 10*3/uL — ABNORMAL HIGH (ref 1.7–7.7)
Neutrophils Relative %: 80 %
PLATELETS: 217 10*3/uL (ref 150–400)
RBC: 5.21 MIL/uL (ref 4.22–5.81)
RDW: 13.4 % (ref 11.5–15.5)
WBC: 16.8 10*3/uL — AB (ref 4.0–10.5)

## 2016-08-31 LAB — PROTIME-INR
INR: 1.21
PROTHROMBIN TIME: 15.4 s — AB (ref 11.4–15.2)

## 2016-08-31 LAB — I-STAT CG4 LACTIC ACID, ED: LACTIC ACID, VENOUS: 1.81 mmol/L (ref 0.5–1.9)

## 2016-08-31 LAB — GLUCOSE, CAPILLARY: GLUCOSE-CAPILLARY: 117 mg/dL — AB (ref 65–99)

## 2016-08-31 LAB — PROCALCITONIN: PROCALCITONIN: 0.12 ng/mL

## 2016-08-31 MED ORDER — SODIUM CHLORIDE 0.9 % IV SOLN
INTRAVENOUS | Status: DC
  Administered 2016-08-31 – 2016-09-01 (×2): via INTRAVENOUS
  Administered 2016-09-02: 50 mL/h via INTRAVENOUS
  Administered 2016-09-02: 07:00:00 via INTRAVENOUS

## 2016-08-31 MED ORDER — IPRATROPIUM-ALBUTEROL 0.5-2.5 (3) MG/3ML IN SOLN: 3.0000 mL | Freq: Four times a day (QID) | RESPIRATORY_TRACT | Status: DC | PRN

## 2016-08-31 MED ORDER — ACETAMINOPHEN 500 MG PO TABS
1000.0000 mg | ORAL_TABLET | Freq: Once | ORAL | Status: AC
  Administered 2016-08-31: 1000 mg via ORAL
  Filled 2016-08-31: qty 2

## 2016-08-31 MED ORDER — VANCOMYCIN HCL 10 G IV SOLR: 1500.0000 mg | INTRAVENOUS | Status: DC

## 2016-08-31 MED ORDER — DEXTROSE 5 % IV SOLN
2.0000 g | INTRAVENOUS | Status: DC
  Administered 2016-08-31 – 2016-09-03 (×4): 2 g via INTRAVENOUS
  Filled 2016-08-31 (×5): qty 2

## 2016-08-31 MED ORDER — PREDNISONE 50 MG PO TABS
70.0000 mg | ORAL_TABLET | Freq: Every day | ORAL | Status: DC
  Administered 2016-09-01 – 2016-09-04 (×4): 70 mg via ORAL
  Filled 2016-08-31 (×4): qty 1

## 2016-08-31 MED ORDER — ACETAMINOPHEN 650 MG RE SUPP: 650.0000 mg | Freq: Four times a day (QID) | RECTAL | Status: DC | PRN

## 2016-08-31 MED ORDER — INSULIN ASPART 100 UNIT/ML ~~LOC~~ SOLN
0.0000 [IU] | Freq: Three times a day (TID) | SUBCUTANEOUS | Status: DC
  Administered 2016-09-01: 2 [IU] via SUBCUTANEOUS
  Administered 2016-09-01: 3 [IU] via SUBCUTANEOUS
  Administered 2016-09-02 – 2016-09-03 (×2): 2 [IU] via SUBCUTANEOUS

## 2016-08-31 MED ORDER — SODIUM CHLORIDE 0.9% FLUSH
3.0000 mL | Freq: Two times a day (BID) | INTRAVENOUS | Status: DC
  Administered 2016-08-31 – 2016-09-03 (×4): 3 mL via INTRAVENOUS

## 2016-08-31 MED ORDER — HEPARIN SODIUM (PORCINE) 5000 UNIT/ML IJ SOLN
5000.0000 [IU] | Freq: Three times a day (TID) | INTRAMUSCULAR | Status: DC
  Administered 2016-08-31 – 2016-09-04 (×11): 5000 [IU] via SUBCUTANEOUS
  Filled 2016-08-31 (×11): qty 1

## 2016-08-31 MED ORDER — SODIUM CHLORIDE 0.9 % IV BOLUS (SEPSIS)
1000.0000 mL | Freq: Once | INTRAVENOUS | Status: AC
  Administered 2016-08-31: 1000 mL via INTRAVENOUS

## 2016-08-31 MED ORDER — ONDANSETRON HCL 4 MG/2ML IJ SOLN: 4.0000 mg | Freq: Four times a day (QID) | INTRAMUSCULAR | Status: DC | PRN

## 2016-08-31 MED ORDER — DILTIAZEM HCL ER COATED BEADS 180 MG PO CP24
300.0000 mg | ORAL_CAPSULE | Freq: Every day | ORAL | Status: DC
  Administered 2016-08-31 – 2016-09-04 (×5): 300 mg via ORAL
  Filled 2016-08-31 (×5): qty 1

## 2016-08-31 MED ORDER — PIPERACILLIN-TAZOBACTAM 3.375 G IVPB 30 MIN
3.3750 g | Freq: Once | INTRAVENOUS | Status: AC
  Administered 2016-08-31: 3.375 g via INTRAVENOUS
  Filled 2016-08-31: qty 50

## 2016-08-31 MED ORDER — COLCHICINE 0.6 MG PO TABS
0.6000 mg | ORAL_TABLET | Freq: Every day | ORAL | Status: DC
  Administered 2016-09-01 – 2016-09-04 (×4): 0.6 mg via ORAL
  Filled 2016-08-31 (×5): qty 1

## 2016-08-31 MED ORDER — ASPIRIN EC 81 MG PO TBEC
81.0000 mg | DELAYED_RELEASE_TABLET | Freq: Every day | ORAL | Status: DC
  Administered 2016-08-31 – 2016-09-04 (×5): 81 mg via ORAL
  Filled 2016-08-31 (×5): qty 1

## 2016-08-31 MED ORDER — VANCOMYCIN HCL IN DEXTROSE 1-5 GM/200ML-% IV SOLN
1000.0000 mg | Freq: Once | INTRAVENOUS | Status: AC
  Administered 2016-08-31: 1000 mg via INTRAVENOUS
  Filled 2016-08-31: qty 200

## 2016-08-31 MED ORDER — TRAMADOL HCL 50 MG PO TABS
50.0000 mg | ORAL_TABLET | Freq: Four times a day (QID) | ORAL | Status: DC | PRN
  Administered 2016-09-01 – 2016-09-03 (×4): 50 mg via ORAL
  Filled 2016-08-31 (×4): qty 1

## 2016-08-31 MED ORDER — POLYETHYLENE GLYCOL 3350 17 G PO PACK: 17.0000 g | PACK | Freq: Every day | ORAL | Status: DC | PRN

## 2016-08-31 MED ORDER — PIPERACILLIN-TAZOBACTAM 3.375 G IVPB
3.3750 g | Freq: Three times a day (TID) | INTRAVENOUS | Status: DC
  Filled 2016-08-31 (×2): qty 50

## 2016-08-31 MED ORDER — ACETAMINOPHEN 325 MG PO TABS: 650.0000 mg | ORAL_TABLET | Freq: Four times a day (QID) | ORAL | Status: DC | PRN

## 2016-08-31 MED ORDER — ONDANSETRON HCL 4 MG PO TABS: 4.0000 mg | ORAL_TABLET | Freq: Four times a day (QID) | ORAL | Status: DC | PRN

## 2016-08-31 MED ORDER — VANCOMYCIN HCL IN DEXTROSE 1-5 GM/200ML-% IV SOLN
1000.0000 mg | INTRAVENOUS | Status: AC
  Administered 2016-08-31: 1000 mg via INTRAVENOUS
  Filled 2016-08-31: qty 200

## 2016-08-31 NOTE — Assessment & Plan Note (Signed)
May also be reason for fever Pt feels terrible Will send to ER for further evaluation since he  had biopsy prostate Monday

## 2016-08-31 NOTE — Progress Notes (Signed)
Pharmacy Antibiotic Note  Travis Daniels is a 71 y.o. male admitted on 08/31/2016 with cellulitis, UTI and possible prostatitis.  Pharmacy has been consulted for Vanc/Zosyn dosing.  Plan: Vancomycin 2g total loading dose then 1500mg  IV every 24 hours.  Goal trough 10-15 mcg/mL. Zosyn 3.375g IV q8h (4 hour infusion).  Daily SCr Discontinue combination of vanc/Zosyn when possible due to increased risk of renal dysfunction  Height: 6\' 1"  (185.4 cm) Weight: (!) 312 lb (141.5 kg) IBW/kg (Calculated) : 79.9  Temp (24hrs), Avg:102.3 F (39.1 C), Min:102.1 F (38.9 C), Max:102.5 F (39.2 C)   Recent Labs Lab 08/31/16 1503 08/31/16 1511  WBC 16.8*  --   CREATININE 1.89*  --   LATICACIDVEN  --  1.81    Estimated Creatinine Clearance: 53 mL/min (A) (by C-G formula based on SCr of 1.89 mg/dL (H)).    No Known Allergies   Thank you for allowing pharmacy to be a part of this patient's care.   Adrian Saran, PharmD, BCPS Pager [phone] 08/31/2016 4:18 PM

## 2016-08-31 NOTE — Consult Note (Addendum)
Consult: Fever Requested by: Dr. Gareth Morgan  History of Present Illness:   1 - Fever - pt underwent TRUS prostate bx 6/18. He took po Bactrim and received IM Gent prior to biopsy. The next morning his right foot hurt. The right foot got progressively more tender with development of  swelling and erythema of his right foot and up his leg. He saw Dr. Etter Sjogren who called our office and pt was directed to ED. He had a temp of 102.5 on presentation and now AFVSS. UA showed no bacteria, tntc wbc's, tntc rbc's. WBC 16.8. Cr 1.89 (near baseline). LA was normal. Foot xray noted soft tissue swelling, cannot exclude gout. He's noted dark urine since the biopsy, but no clots. No dysuria or trouble voiding.   2 - Elevated PSA - No FHX prostate cancer. PSA 5.18 by annual screening by PCP 08/2014. DRE 35gm smooth. No piror PSA's avail for review. UCX at time negative. He has opted for initial closw surveillance given comorbidity.  Recent Course:  08/2014 PSA 5.18 (PCP labs); 10/2014: DRE 35gm smooth / PSA 4.01 21% free  05/2015 PSA 5.17 / DRE 35gm smooth  06/2016 PSA 6.7 / DRE 35gm smooth ===> 08/28/2016 TRUS 64mL with hypoechoic R apex - biopsy + for higher volume low grade disease at the right apex (I did not go over path with patient).    Past Medical History:  Diagnosis Date  . Arthritis   . B12 deficiency   . COPD (chronic obstructive pulmonary disease) ([place]) 01/2011   FVC 47%, FEV1 49%   . 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 Hyperlipidemia   . Hypertension   . Lower extremity edema   . Secondary pulmonary hypertension 01/2011   RV syst pressure 30-40 mm Hg on ECHO    Past Surgical History:  Procedure Laterality Date  . 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 Ronelle Nigh at [facility] in [place], [place])  . FINGER SURGERY Right 11   rt middle finger  . TOTAL SHOULDER ARTHROPLASTY Right  01/21/2013   Procedure: SHOULDER HEMI ARTHROPLASTY;  Surgeon: Garald Balding, MD;  Location: [place];  Service: Orthopedics;  Laterality: Right;  . TRANSTHORACIC ECHOCARDIOGRAM  01/2011   EF 55-60%, mild asymmetric LVH, elevated RV systolic pressure  . VASECTOMY      Home Medications:  Prescriptions Prior to Admission  Medication Sig Dispense Refill Last Dose  . albuterol (PROVENTIL HFA;VENTOLIN HFA) 108 (90 BASE) MCG/ACT inhaler Inhale 2 puffs into the lungs every 6 (six) hours as needed for wheezing or shortness of breath.   Past Month at Unknown time  . aspirin 81 MG tablet Take 81 mg by mouth daily.   Past Month at Unknown time  . diltiazem (CARDIZEM CD) 300 MG 24 hr capsule Take 1 capsule (300 mg total) by mouth daily. 90 capsule 3 08/30/2016 at Unknown time  . Garlic (GARLIQUE PO) Take 1 tablet by mouth daily.   Past Month at Unknown time  . valsartan (DIOVAN) 160 MG tablet Take 1 tablet (160 mg total) by mouth 2 (two) times daily. 180 tablet 3 08/30/2016 at Unknown time  . betamethasone dipropionate (DIPROLENE) 0.05 % cream Apply topically 2 (two) times daily. (Patient not taking: Reported on 08/31/2016) 30 g 1 Completed Course at Unknown time  . glucose blood (ONE TOUCH ULTRA TEST) test strip CHECK BLOOD SUGAR TWICE DAILY 100 each 5  Taking  . nystatin (MYCOSTATIN/NYSTOP) 100000 UNIT/GM POWD APPLY TOPICALLY 4 (FOUR) TIMES DAILY. (Patient not taking: Reported on 08/31/2016) 15 g 1 Completed Course at Unknown time  . ONETOUCH DELICA LANCETS FINE MISC Check blood sugar daily 100 each 12 Taking   Allergies: No Known Allergies  Family History  Problem Relation Age of Onset  . CAD Mother        MI in her 71s  . Cancer Father        colon and pancreas  . Epilepsy Son    Social History:  reports that he has never smoked. He has never used smokeless tobacco. He reports that he drinks alcohol. He reports that he does not use drugs.  ROS: A complete review of systems was performed.  All  systems are negative except for pertinent findings as noted. Review of Systems  All other systems reviewed and are negative.    Physical Exam:  Vital signs in last 24 hours: Temp:  [98.9 F (37.2 C)-102.5 F (39.2 C)] 98.9 F (37.2 C) (06/21 1927) Pulse Rate:  [91-104] 92 (06/21 1927) Resp:  [15-29] 20 (06/21 1927) BP: (117-153)/(75-98) 153/98 (06/21 1927) SpO2:  [90 %-99 %] 99 % (06/21 1927) Weight:  [141.5 kg (312 lb)] 141.5 kg (312 lb) (06/21 1430) General:  Alert and oriented, No acute distress HEENT: Normocephalic, atraumatic Neck: No JVD or lymphadenopathy Cardiovascular: Regular rate and rhythm Lungs: Regular rate and effort Abdomen: Soft, nontender, nondistended, no abdominal masses Back: No CVA tenderness Extremities: No edema Neurologic: Grossly intact Ext: right foot swelling with erythema marching up his leg. I marked it with a pen.   Laboratory Data:  Results for orders placed or performed during the hospital encounter of 08/31/16 (from the past 24 hour(s))  Comprehensive metabolic panel     Status: Abnormal   Collection Time: 08/31/16  3:03 PM  Result Value Ref Range   Sodium 137 135 - 145 mmol/L   Potassium 3.7 3.5 - 5.1 mmol/L   Chloride 96 (L) 101 - 111 mmol/L   CO2 30 22 - 32 mmol/L   Glucose, Bld 118 (H) 65 - 99 mg/dL   BUN 25 (H) 6 - 20 mg/dL   Creatinine, Ser 1.89 (H) 0.61 - 1.24 mg/dL   Calcium 9.5 8.9 - 10.3 mg/dL   Total Protein 7.9 6.5 - 8.1 g/dL   Albumin 4.0 3.5 - 5.0 g/dL   AST 21 15 - 41 U/L   ALT 16 (L) 17 - 63 U/L   Alkaline Phosphatase 70 38 - 126 U/L   Total Bilirubin 3.0 (H) 0.3 - 1.2 mg/dL   GFR calc non Af Amer 34 (L) >60 mL/min   GFR calc Af Amer 40 (L) >60 mL/min   Anion gap 11 5 - 15  CBC with Differential     Status: Abnormal   Collection Time: 08/31/16  3:03 PM  Result Value Ref Range   WBC 16.8 (H) 4.0 - 10.5 K/uL   RBC 5.21 4.22 - 5.81 MIL/uL   Hemoglobin 14.8 13.0 - 17.0 g/dL   HCT 44.1 39.0 - 52.0 %   MCV 84.6 78.0  - 100.0 fL   MCH 28.4 26.0 - 34.0 pg   MCHC 33.6 30.0 - 36.0 g/dL   RDW 13.4 11.5 - 15.5 %   Platelets 217 150 - 400 K/uL   Neutrophils Relative % 80 %   Neutro Abs 13.5 (H) 1.7 - 7.7 K/uL   Lymphocytes Relative 10 %   Lymphs  Abs 1.7 0.7 - 4.0 K/uL   Monocytes Relative 10 %   Monocytes Absolute 1.6 (H) 0.1 - 1.0 K/uL   Eosinophils Relative 0 %   Eosinophils Absolute 0.0 0.0 - 0.7 K/uL   Basophils Relative 0 %   Basophils Absolute 0.0 0.0 - 0.1 K/uL  Protime-INR     Status: Abnormal   Collection Time: 08/31/16  3:03 PM  Result Value Ref Range   Prothrombin Time 15.4 (H) 11.4 - 15.2 seconds   INR 1.21   Urinalysis, Routine w reflex microscopic     Status: Abnormal   Collection Time: 08/31/16  3:03 PM  Result Value Ref Range   Color, Urine AMBER (A) YELLOW   APPearance CLEAR CLEAR   Specific Gravity, Urine 1.016 1.005 - 1.030   pH 5.0 5.0 - 8.0   Glucose, UA NEGATIVE NEGATIVE mg/dL   Hgb urine dipstick LARGE (A) NEGATIVE   Bilirubin Urine NEGATIVE NEGATIVE   Ketones, ur NEGATIVE NEGATIVE mg/dL   Protein, ur 30 (A) NEGATIVE mg/dL   Nitrite NEGATIVE NEGATIVE   Leukocytes, UA NEGATIVE NEGATIVE   RBC / HPF TOO NUMEROUS TO COUNT 0 - 5 RBC/hpf   WBC, UA TOO NUMEROUS TO COUNT 0 - 5 WBC/hpf   Bacteria, UA NONE SEEN NONE SEEN   Squamous Epithelial / LPF NONE SEEN NONE SEEN   Mucous PRESENT   I-Stat CG4 Lactic Acid, ED     Status: None   Collection Time: 08/31/16  3:11 PM  Result Value Ref Range   Lactic Acid, Venous 1.81 0.5 - 1.9 mmol/L  Procalcitonin     Status: None   Collection Time: 08/31/16  7:50 PM  Result Value Ref Range   Procalcitonin 0.12 ng/mL   No results found for this or any previous visit (from the past 240 hour(s)). Creatinine:  Recent Labs  08/31/16 1503  CREATININE 1.89*    Impression/Assessment/plan:  1) fever - very strange presentation for post-prostate biopsy infection. Not sure if foot swelling is related to or a sequelae of the biopsy or if it's  an isolated event. He received rocephin and on Vanc/Zosyn. Appears to be voiding well. Will follow.   2) elevated PSA - pathology is back, but I did not discuss with patient. I will let Dr. Tresa Moore discuss with him.   Appreciate great hospitalist care. I'll notify Dr. Tresa Moore of admission.   Salayah Meares 08/31/2016, 9:22 PM

## 2016-08-31 NOTE — Assessment & Plan Note (Signed)
Biopsy done Monday -- now with fever 102 D/w with [facility]--they have requested pt go to ER at [facility] long

## 2016-08-31 NOTE — ED Notes (Signed)
Bed: JL59 Expected date:  Expected time:  Means of arrival:  Comments: Hold for [place]

## 2016-08-31 NOTE — H&P (Signed)
History and Physical    Travis Daniels YJE:[id number] DOB: [birth year] DOA: 08/31/2016  PCP: Ann Held, DO   Patient coming from: Home  Chief Complaint: Right Leg Swelling and Pain  HPI: Travis Daniels is a 71 y.o. male with medical history significant of COPD, HTN, HLD, Suspected CKD stage 3, Diet Controlled Diabetes, Obesity and other comorbidities who presented with 3 day worsening Right Leg swelling associated with some erythema and warmth. Patient states that he recently was bit by a tick on his inner thigh 3 weeks ago and went to PCP and was treated with Doxycycline. He went to the Urologist Monday for a Prostate Biopsy and states the next day on Tuesday he noticed that his Right leg started swelling significantly and had some erythema. Patient states he thought he sprained his ankle as it was a little hard to walk. Patient states that it subsequently got worse the next day and last night the patient's wife stated she felt significant warmth. He was unable to ambulate properly and stated that his foot and leg hurt so bad that he had to shuffle and walk with crutches. Because he has a hx of cellulitis patient felt as this was similar so he presented to [facility] for evaluation. Patient states that he has some urinary discomfort after his prostate biopsy and that his urine has been dark like "tea" and had blood in it. He denies any other symptoms besides his leg. Patient was found to be febrile in the ED He was worked up and [facility] was called to admit for Sepsis and Right Leg Cellulitis.   ED Course: Had Sepsis Protocol and started on Broad Spectrum Abx. He was given NS and unfortunately did not get a U/A or Urine Cx prior to Abx Administration. Patient also had a foot X-Ray and Blood Cx's Drawn.   Review of Systems: As per HPI otherwise 10 point review of systems negative. Denied CP, SOB, Nausea, Vomiting, Lightheadedness, Dizziness, Constipation or Diarrhea.   Past Medical History:    Diagnosis Date  . Arthritis   . B12 deficiency   . COPD (chronic obstructive pulmonary disease) ([place]) 01/2011   FVC 47%, FEV1 49%   . 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 Hyperlipidemia   . Hypertension   . Lower extremity edema   . Secondary pulmonary hypertension 01/2011   RV syst pressure 30-40 mm Hg on ECHO     Past Surgical History:  Procedure Laterality Date  . 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 Ronelle Nigh at [facility] in [place], [place])  . FINGER SURGERY Right 11   rt middle finger  . TOTAL SHOULDER ARTHROPLASTY Right 01/21/2013   Procedure: SHOULDER HEMI ARTHROPLASTY;  Surgeon: Garald Balding, MD;  Location: [place];  Service: Orthopedics;  Laterality: Right;  . TRANSTHORACIC ECHOCARDIOGRAM  01/2011   EF 55-60%, mild asymmetric LVH, elevated RV systolic pressure  . VASECTOMY     SOCIAL HISTORY  reports that he has never smoked. He has never used smokeless tobacco. He reports that he drinks alcohol. He reports that he does not use drugs. Used to work in a [employer]  No Known Allergies  Family History  Problem Relation Age of Onset  . CAD Mother        MI in her 66s  . Cancer Father  colon and pancreas  . Epilepsy Son    Prior to Admission medications   Medication Sig Start Date End Date Taking? Authorizing Provider  albuterol (PROVENTIL HFA;VENTOLIN HFA) 108 (90 BASE) MCG/ACT inhaler Inhale 2 puffs into the lungs every 6 (six) hours as needed for wheezing or shortness of breath.   Yes [provider]  aspirin 81 MG tablet Take 81 mg by mouth daily.   Yes [provider]  diltiazem (CARDIZEM CD) 300 MG 24 hr capsule Take 1 capsule (300 mg total) by mouth daily. 07/13/14  Yes Roma Schanz R, DO  Garlic (GARLIQUE PO) Take 1 tablet by mouth daily.   Yes [provider]  valsartan (DIOVAN) 160 MG  tablet Take 1 tablet (160 mg total) by mouth 2 (two) times daily. 08/27/15  Yes Roma Schanz R, DO  betamethasone dipropionate (DIPROLENE) 0.05 % cream Apply topically 2 (two) times daily. Patient not taking: Reported on 08/31/2016 07/28/14   Carollee Herter, Kendrick Fries R, DO  glucose blood (ONE TOUCH ULTRA TEST) test strip CHECK BLOOD SUGAR TWICE DAILY 08/27/15   Carollee Herter, [name], DO  nystatin (MYCOSTATIN/NYSTOP) 100000 UNIT/GM POWD APPLY TOPICALLY 4 (FOUR) TIMES DAILY. Patient not taking: Reported on 08/31/2016 03/04/15   Carollee Herter, [name], DO  [facility] DELICA LANCETS FINE MISC Check blood sugar daily 07/03/13   Ann Held, [place]    Physical Exam: Vitals:   08/31/16 1726 08/31/16 1726 08/31/16 1730 08/31/16 1800  BP: 118/81 118/81 117/75 138/89  Pulse: 97 98 94 98  Resp: (!) 23 15 (!) 24 18  Temp:  99.5 F (37.5 C)    TempSrc:  Oral    SpO2: 94% 96% 92% 99%  Weight:      Height:       Constitutional: WN/WD obese Caucasian male in NAD and appears calm and comfortable Eyes: Lids and conjunctivae normal, sclerae anicteric  ENMT: External Ears, Nose appear normal. Grossly normal hearing. Mucous membranes are slightly dry. Neck: Appears normal, supple, no cervical masses, normal ROM, no appreciable thyromegaly, no JVD Respiratory: Diminished to auscultation bilaterally, no wheezing, rales, rhonchi or crackles. Normal respiratory effort and patient is not tachypenic. No accessory muscle use.  Cardiovascular: Tachycardic Rate and Rhythm, no murmurs / rubs / gallops. S1 and S2 auscultated. Significant 2+ Right Lower Extremity Edema compared to Left Abdomen: Soft, non-tender, non-distended. No masses palpated. No appreciable hepatosplenomegaly. Bowel sounds positive x4.  GU: Deferred. Musculoskeletal: No clubbing / cyanosis of digits/nails. No joint deformity upper and lower extremities.  Skin: Had some erythema and mild warmth with significant swelling in Right Leg.   Neurologic:  CN 2-12 grossly intact with no focal deficits. Sensation intact in all 4 Extremities. Romberg sign cerebellar reflexes not assessed.  Psychiatric: Normal judgment and insight. Alert and oriented x 3. Normal mood and appropriate affect.   Labs on Admission: I have personally reviewed following labs and imaging studies  CBC:  Recent Labs Lab 08/31/16 1503  WBC 16.8*  NEUTROABS 13.5*  HGB 14.8  HCT 44.1  MCV 84.6  PLT 347   Basic Metabolic Panel:  Recent Labs Lab 08/31/16 1503  NA 137  K 3.7  CL 96*  CO2 30  GLUCOSE 118*  BUN 25*  CREATININE 1.89*  CALCIUM 9.5   GFR: Estimated Creatinine Clearance: 53 mL/min (A) (by C-G formula based on SCr of 1.89 mg/dL (H)). Liver Function Tests:  Recent Labs Lab 08/31/16 1503  AST 21  ALT 16*  ALKPHOS 70  BILITOT 3.0*  PROT 7.9  ALBUMIN 4.0   No results for input(s): LIPASE, AMYLASE in the last 168 hours. No results for input(s): AMMONIA in the last 168 hours. Coagulation Profile:  Recent Labs Lab 08/31/16 1503  INR 1.21   Cardiac Enzymes: No results for input(s): CKTOTAL, CKMB, CKMBINDEX, TROPONINI in the last 168 hours. BNP (last 3 results) No results for input(s): PROBNP in the last 8760 hours. HbA1C: No results for input(s): HGBA1C in the last 72 hours. CBG: No results for input(s): GLUCAP in the last 168 hours. Lipid Profile: No results for input(s): CHOL, HDL, LDLCALC, TRIG, CHOLHDL, LDLDIRECT in the last 72 hours. Thyroid Function Tests: No results for input(s): TSH, T4TOTAL, FREET4, T3FREE, THYROIDAB in the last 72 hours. Anemia Panel: No results for input(s): VITAMINB12, FOLATE, FERRITIN, TIBC, IRON, RETICCTPCT in the last 72 hours. Urine analysis:    Component Value Date/Time   COLORURINE YELLOW 01/14/2013 [place] 01/14/2013 1032   LABSPEC 1.014 01/14/2013 1032   PHURINE 5.5 01/14/2013 1032   GLUCOSEU NEGATIVE 01/14/2013 1032   HGBUR TRACE (A) 01/14/2013 1032   BILIRUBINUR neg  08/27/2015 1458   KETONESUR NEGATIVE 01/14/2013 1032   PROTEINUR trace 08/27/2015 1458   PROTEINUR NEGATIVE 01/14/2013 1032   UROBILINOGEN 0.2 08/27/2015 1458   UROBILINOGEN 0.2 01/14/2013 1032   NITRITE neg 08/27/2015 1458   NITRITE NEGATIVE 01/14/2013 1032   LEUKOCYTESUR Negative 08/27/2015 1458   Sepsis Labs: !!!!!!!!!!!!!!!!!!!!!!!!!!!!!!!!!!!!!!!!!!!! @LABRCNTIP (procalcitonin:4,lacticidven:4) )No results found for this or any previous visit (from the past 240 hour(s)).   Radiological Exams on Admission: Dg Foot Complete Right  Result Date: 08/31/2016 CLINICAL DATA:  RIGHT foot and RIGHT lower extremity redness, swelling and increased warmth, beginning Tuesday evening, diabetes mellitus, no known injury EXAM: RIGHT FOOT COMPLETE - 3+ VIEW COMPARISON:  03/25/2014 FINDINGS: Diffuse soft tissue swelling. Osseous demineralization. Joint spaces preserved. Plantar and Achilles insertion calcaneal spur formation. Additional spur formation at dorsal margin of first metatarsal head at first MTP joint. No acute fracture, dislocation, or acute bone destruction. Old erosion at medial margin of the head of the proximal phalanx great toe with new erosions at the head of the first metatarsal medially. IMPRESSION: Soft tissue swelling and diffuse osseous demineralization. No acute bony abnormalities. Old erosion at IP joint great toe with new erosions at the head of the first metatarsal, cannot exclude gout. Electronically Signed   By: Lavonia Dana M.D.   On: 08/31/2016 16:55    EKG: No EKG done on admission but one will be ordered.   Assessment/Plan Principal Problem:   Sepsis ([place]) Active Problems:   Essential hypertension, benign   Venous stasis dermatitis   COPD (chronic obstructive pulmonary disease) (HCC)   Diabetes mellitus (HCC)   Cellulitis of leg, right   Cellulitis   Gout attack   Acute renal failure superimposed on chronic kidney disease (HCC)   Hematuria   Hyperlipidemia  Sepsis  suspected to ? Lower Extremity Cellulitis in the setting of Venous Stasis vs Other Etiology -Admit to Inpatient Telemetry -Sepsis Protocol Utilized in ED -Patient was Febrile, Tachycardic, Tachypenic, had a Leukocytosis and had suspected source of Skin infection -Has a Hx of Skin infections and states that his leg was very warm last night and that his Cellulitis starts like this -Was placed on Broad Spectrum Abx with Vanc/Zosyn but de-escalated to IV Ceftriaxone as patient's Kidney function is off -Follow Blood Cx's -Unfortunately patient received IV Abx prior to getting a U/A  and Urine Cx but still need to get U/A and Urine Cx -WBC on Admission was 16.8 -Check Procalcitonin; Lactic Acid Level was 1.81 -Repeat CBC in AM -C/w IVF with NS at 100 mL/hr -Foot Xray showed Soft tissue swelling and diffuse osseous demineralization  Right Lower Leg Swelling from possible Cellulitis r/o other Etiology such as DVT or Gout Flare -Check Venous Duplex to r/o DVT -Foot Xray showed Soft tissue swelling and diffuse osseous demineralization along with no acute bony abnormalities. There was Plantar and Achilles insertion calcaneal spur formation. Additional spur formation at dorsal margin of first metatarsal head at first MTP joint. Also showed Old erosion at IP joint great toe with new erosions at the head of the first metatarsal, cannot exclude gout -Started Prednisone 0.5 mg/kg/day x 5 days along with [place]  -C/w Abx As above -May need further leg imaging and Characterization with a CT Scan -Concerning as patient was bitten by a tick 3 weeks ago -Consider getting ECHO if not improving   Acute on Chronic Kidney Disease Stage 3 -Prior Records show no normal Cr Level and ranging from 1.2-1.5 -BUN/Cr on Admission was 25/1.89 -Avoid Nephrotoxic Medication and hold home Losartan -Continue to Monitor Renal Function and repeat CMP in AM  Essential HTN -Blood Pressure currently controlled -Hold Home  Losartan but continue Home Cardizem of 300 mg po Daily   COPD -Currently Not in Exacerbation -Hold Home Albuterol Inhaler  -Place on DuoNebs 3 mL q6hprn for Wheezing of SOB  Hyperglycemia in the setting of Type 2 Diabetes Mellitus -Place on Moderate Novolog SSI AC as BS on CMP was 118 and likely to go up with Steroid Use -Continue to Monitor CBG's -Check HbA1c in AM  Hematuria -Reported by the Patient -Likely related to Prostate Biopsy done 08/28/16 -Check U/A and Urine Cx  -Continue To Monior and if Unable to urinate get Bladder Scan and Do I/O cath -Consider Consulting discussing with Urology tomorrow if not improving as EDP discussed with Dr. Junious Silk today   HLD -Check Lipid Panel   DVT prophylaxis: Heparin 5,000 units sq q8h Code Status: FULL CODE Family Communication: Discussed with Wife at Bedside Disposition Plan: Home at D/C Consults called: Urology Called by EDP Admission status: Inpatient Telemetry   [facility], D.O. [facility] Pager [phone]  If 7PM-7AM, please contact night-coverage [email or web address] Password [facility]  08/31/2016, 6:13 PM

## 2016-08-31 NOTE — ED Provider Notes (Signed)
[facility] DEPT Provider Note   CSN: [id number] Arrival date & time: 08/31/16  1419     History   Chief Complaint Chief Complaint  Patient presents with  . Fever  . Recurrent Skin Infections    right foot/RLE    HPI Travis Daniels is a 71 y.o. male.  HPI 71 year old male presents at his primary care physician's request after he had presented to PCP with concern for pain and swelling in the right foot. Reports that the pain started spontaneously 2 days ago. Reports he did not injure it. Reports that he needed the pain is severe, worse on the top of his foot, but notes swelling of the whole foot and lower extremity, as well as redness. Patient reports he had chills the other night, but did not notice he had fever. Reports he's had hematuria since his prostate biopsy on Monday, however has not had dysuria, no flank pain, no nausea or vomiting. Denies any other infectious symptoms, including no headache, sore throat, congestion, other rash.   Past Medical History:  Diagnosis Date  . Arthritis   . B12 deficiency   . COPD (chronic obstructive pulmonary disease) ([place]) 01/2011   FVC 47%, FEV1 49%   . 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 Hyperlipidemia   . Hypertension   . Lower extremity edema   . Secondary pulmonary hypertension 01/2011   RV syst pressure 30-40 mm Hg on ECHO     Patient Active Problem List   Diagnosis Date Noted  . H/O prostate biopsy 08/31/2016  . Cellulitis of leg, right 08/31/2016  . Cellulitis 08/31/2016  . Gout attack 08/31/2016  . Acute renal failure superimposed on chronic kidney disease ([place]) 08/31/2016  . Hematuria 08/31/2016  . Hyperlipidemia 08/31/2016  . Sepsis ([place]) 08/31/2016  . Varicose veins of left lower extremity with complications 14/48/1856  . Tendonitis, Achilles, right 03/25/2014  . Pain in joint, ankle and foot 03/23/2014  . Cellulitis of left lower leg 01/20/2014  . Diabetes mellitus ([place]) 07/04/2013  .  Severe obesity (BMI >= 40) ([place]) 07/03/2013  . OSA (obstructive sleep apnea) 01/23/2013  . Hypersomnolence 01/23/2013  . Osteoarthritis of shoulder 01/23/2013  . Obesity, Class III, BMI 40-49.9 (morbid obesity) ([place]) 01/23/2013  . Acute respiratory failure with hypoxia ([place]) 01/21/2013  . COPD (chronic obstructive pulmonary disease) ([place]) 01/21/2013  . Hypertension 01/21/2013  . Chest pain 10/30/2012  . Dyspnea 10/30/2012  . Preop cardiovascular exam 10/30/2012  . Venous stasis dermatitis 09/17/2012  . Essential hypertension, benign 09/10/2012  . Peripheral vascular disease ([place]) 09/10/2012  . Pain in joint, shoulder region 09/10/2012    Past Surgical History:  Procedure Laterality Date  . 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 Ronelle Nigh at [facility] in [place], [place])  . FINGER SURGERY Right 11   rt middle finger  . TOTAL SHOULDER ARTHROPLASTY Right 01/21/2013   Procedure: SHOULDER HEMI ARTHROPLASTY;  Surgeon: Garald Balding, MD;  Location: [facility];  Service: Orthopedics;  Laterality: Right;  . TRANSTHORACIC ECHOCARDIOGRAM  01/2011   EF 55-60%, mild asymmetric LVH, elevated RV systolic pressure  . VASECTOMY         Home Medications    Prior to Admission medications   Medication Sig Start Date End Date Taking? Authorizing Provider  albuterol (PROVENTIL HFA;VENTOLIN HFA) 108 (90 BASE) MCG/ACT inhaler Inhale 2 puffs into the lungs every 6 (six) hours  as needed for wheezing or shortness of breath.   Yes [provider]  aspirin 81 MG tablet Take 81 mg by mouth daily.   Yes [provider]  diltiazem (CARDIZEM CD) 300 MG 24 hr capsule Take 1 capsule (300 mg total) by mouth daily. 07/13/14  Yes Roma Schanz R, DO  Garlic (GARLIQUE PO) Take 1 tablet by mouth daily.   Yes [provider]  valsartan (DIOVAN) 160 MG tablet Take 1 tablet (160 mg total) by  mouth 2 (two) times daily. 08/27/15  Yes Roma Schanz R, DO  betamethasone dipropionate (DIPROLENE) 0.05 % cream Apply topically 2 (two) times daily. Patient not taking: Reported on 08/31/2016 07/28/14   Carollee Herter, Kendrick Fries R, DO  glucose blood (ONE TOUCH ULTRA TEST) test strip CHECK BLOOD SUGAR TWICE DAILY 08/27/15   Carollee Herter, [name], DO  nystatin (MYCOSTATIN/NYSTOP) 100000 UNIT/GM POWD APPLY TOPICALLY 4 (FOUR) TIMES DAILY. Patient not taking: Reported on 08/31/2016 03/04/15   Carollee Herter, [name], DO  [facility] DELICA LANCETS FINE MISC Check blood sugar daily 07/03/13   Ann Held, DO    Family History Family History  Problem Relation Age of Onset  . CAD Mother        MI in her 65s  . Cancer Father        colon and pancreas  . Epilepsy Son     Social History Social History  Substance Use Topics  . Smoking status: Never Smoker  . Smokeless tobacco: Never Used     Comment: occ alcohol  . Alcohol use 0.0 oz/week     Comment: 2-3 drinks occasionally     Allergies   Patient has no known allergies.   Review of Systems Review of Systems  Constitutional: Positive for chills and fever.  HENT: Negative for sore throat.   Eyes: Negative for visual disturbance.  Respiratory: Negative for cough and shortness of breath.   Cardiovascular: Negative for chest pain.  Gastrointestinal: Negative for abdominal pain, constipation, diarrhea, nausea and vomiting.  Genitourinary: Positive for hematuria. Negative for difficulty urinating and dysuria.  Musculoskeletal: Positive for arthralgias and gait problem. Negative for back pain and neck stiffness.  Skin: Negative for rash.  Neurological: Negative for syncope and headaches.     Physical Exam Updated Vital Signs BP (!) 153/98 (BP Location: Right Arm)   Pulse 92   Temp 98.9 F (37.2 C) (Oral)   Resp 20   Ht 6\' 1"  (1.854 m)   Wt (!) 141.6 kg (312 lb 2.7 oz)   SpO2 99%   BMI 41.19 kg/m   Physical Exam    Constitutional: He is oriented to person, place, and time. He appears well-developed and well-nourished. No distress.  HENT:  Head: Normocephalic and atraumatic.  Eyes: Conjunctivae and EOM are normal.  Neck: Normal range of motion.  Cardiovascular: Normal rate, regular rhythm, normal heart sounds and intact distal pulses.  Exam reveals no gallop and no friction rub.   No murmur heard. Pulmonary/Chest: Effort normal and breath sounds normal. No respiratory distress. He has no wheezes. He has no rales.  Abdominal: Soft. He exhibits no distension. There is no tenderness. There is no guarding.  Musculoskeletal: He exhibits no edema.       Right ankle: He exhibits swelling.       Right foot: There is tenderness, bony tenderness and swelling. There is normal capillary refill, no crepitus and no laceration.  Neurological: He is alert and oriented to  person, place, and time.  Skin: Skin is warm and dry. He is not diaphoretic.  Erythema to right foot and lower leg  Nursing note and vitals reviewed.    ED Treatments / Results  Labs (all labs ordered are listed, but only abnormal results are displayed) Labs Reviewed  COMPREHENSIVE METABOLIC PANEL - Abnormal; Notable for the following:       Result Value   Chloride 96 (*)    Glucose, Bld 118 (*)    BUN 25 (*)    Creatinine, Ser 1.89 (*)    ALT 16 (*)    Total Bilirubin 3.0 (*)    GFR calc non Af Amer 34 (*)    GFR calc Af Amer 40 (*)    All other components within normal limits  CBC WITH DIFFERENTIAL/PLATELET - Abnormal; Notable for the following:    WBC 16.8 (*)    Neutro Abs 13.5 (*)    Monocytes Absolute 1.6 (*)    All other components within normal limits  PROTIME-INR - Abnormal; Notable for the following:    Prothrombin Time 15.4 (*)    All other components within normal limits  URINALYSIS, ROUTINE W REFLEX MICROSCOPIC - Abnormal; Notable for the following:    Color, Urine AMBER (*)    Hgb urine dipstick LARGE (*)    Protein,  ur 30 (*)    All other components within normal limits  GLUCOSE, CAPILLARY - Abnormal; Notable for the following:    Glucose-Capillary 117 (*)    All other components within normal limits  CULTURE, BLOOD (ROUTINE X 2)  CULTURE, BLOOD (ROUTINE X 2)  URINE CULTURE  PROCALCITONIN  TSH  PROTIME-INR  HEMOGLOBIN A1C  I-STAT CG4 LACTIC ACID, ED    EKG  EKG Interpretation None       Radiology Dg Foot Complete Right  Result Date: 08/31/2016 CLINICAL DATA:  RIGHT foot and RIGHT lower extremity redness, swelling and increased warmth, beginning Tuesday evening, diabetes mellitus, no known injury EXAM: RIGHT FOOT COMPLETE - 3+ VIEW COMPARISON:  03/25/2014 FINDINGS: Diffuse soft tissue swelling. Osseous demineralization. Joint spaces preserved. Plantar and Achilles insertion calcaneal spur formation. Additional spur formation at dorsal margin of first metatarsal head at first MTP joint. No acute fracture, dislocation, or acute bone destruction. Old erosion at medial margin of the head of the proximal phalanx great toe with new erosions at the head of the first metatarsal medially. IMPRESSION: Soft tissue swelling and diffuse osseous demineralization. No acute bony abnormalities. Old erosion at IP joint great toe with new erosions at the head of the first metatarsal, cannot exclude gout. Electronically Signed   By: Lavonia Dana M.D.   On: 08/31/2016 16:55    Procedures Procedures (including critical care time)  Medications Ordered in ED Medications  diltiazem (CARDIZEM CD) 24 hr capsule 300 mg (300 mg Oral Given 08/31/16 2033)  aspirin EC tablet 81 mg (81 mg Oral Given 08/31/16 2033)  cefTRIAXone (ROCEPHIN) 2 g in dextrose 5 % 50 mL IVPB (0 g Intravenous Stopped 08/31/16 2101)  heparin injection 5,000 Units (5,000 Units Subcutaneous Given 08/31/16 2033)  sodium chloride flush (NS) 0.9 % injection 3 mL (3 mLs Intravenous Given 08/31/16 2334)  0.9 %  sodium chloride infusion ( Intravenous New  Bag/Given 08/31/16 2002)  acetaminophen (TYLENOL) tablet 650 mg (not administered)    Or  acetaminophen (TYLENOL) suppository 650 mg (not administered)  traMADol (ULTRAM) tablet 50 mg (not administered)  polyethylene glycol (MIRALAX / GLYCOLAX) packet 17 g (not  administered)  ondansetron (ZOFRAN) tablet 4 mg (not administered)    Or  ondansetron (ZOFRAN) injection 4 mg (not administered)  predniSONE (DELTASONE) tablet 70 mg (not administered)  colchicine tablet 0.6 mg (not administered)  insulin aspart (novoLOG) injection 0-15 Units (not administered)  ipratropium-albuterol (DUONEB) 0.5-2.5 (3) MG/3ML nebulizer solution 3 mL (not administered)  sodium chloride 0.9 % bolus 1,000 mL (0 mLs Intravenous Stopped 08/31/16 1604)  sodium chloride 0.9 % bolus 1,000 mL (0 mLs Intravenous Stopped 08/31/16 1750)  piperacillin-tazobactam (ZOSYN) IVPB 3.375 g (0 g Intravenous Stopped 08/31/16 1537)  vancomycin (VANCOCIN) IVPB 1000 mg/200 mL premix (0 mg Intravenous Stopped 08/31/16 1627)  acetaminophen (TYLENOL) tablet 1,000 mg (1,000 mg Oral Given 08/31/16 1539)  vancomycin (VANCOCIN) IVPB 1000 mg/200 mL premix (0 mg Intravenous Stopped 08/31/16 1750)  sodium chloride 0.9 % bolus 1,000 mL (0 mLs Intravenous Stopped 08/31/16 1929)     Initial Impression / Assessment and Plan / ED Course  I have reviewed the triage vital signs and the nursing notes.  Pertinent labs & imaging results that were available during my care of the patient were reviewed by me and considered in my medical decision making (see chart for details).    71 year old male with a history of hypertension, hyperlipidemia, COPD, prostate biopsy on 618, presents with concern for right lower extremity redness, pain and swelling and fever.  Patient febrile to 102.5 on arrival to the emergency department, without other significant vital sign abnormalities.  Patient with mild erythema and swelling of the right lower extremity which may represent  cellulitis, and will be given IV antibiotics to cover for cellulitis and fever. Patient denies urinary symptoms, however given recent prostate biopsy, urinary tract infection or prostatitis is also on the differential. WIll discuss with urology. Plan to obtain labs, XR foot, start IV abx and 2L NS. Delay in urine as pt unable to give sample, gave fluids, and ordered in and out if unable to void.   Will admit for further care of fever, cellulitis.    Final Clinical Impressions(s) / ED Diagnoses   Final diagnoses:  Sepsis, due to unspecified organism [facility])  Cellulitis of right lower extremity    New Prescriptions Current Discharge Medication List       Gareth Morgan, MD 09/01/16 [phone]

## 2016-08-31 NOTE — Progress Notes (Signed)
[name], male   DOB: 1945-04-15, 71 y.o.   MRN: [id number]     Subjective:  I acted as a Education administrator for Dr. Carollee Herter.  Travis Daniels, [place]   [name], male    DOB: 1945-04-01, 71 y.o.   MRN: [id number]  Chief Complaint  Patient presents with  . Foot Swelling    HPI  Patient is in today for swollen right foot.  Swollen and painful for about 2 days.  No known injury.  Has not taken anything for pain. Pt had a prostate biopsy on Monday-- he was told to call the urologist if his temp > 101 Patient Care Team: Carollee Herter, [name], DO as PCP - General (Family Medicine) Donato Heinz, MD as Consulting Physician (Nephrology)   Past Medical History:  Diagnosis Date  . Arthritis   . B12 deficiency   . COPD (chronic obstructive pulmonary disease) ([place]) 01/2011   FVC 47%, FEV1 49%   . 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 Hyperlipidemia   . Hypertension   . Lower extremity edema   . Secondary pulmonary hypertension 01/2011   RV syst pressure 30-40 mm Hg on ECHO     Past Surgical History:  Procedure Laterality Date  . 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 Ronelle Nigh at [facility] in [place], [place])  . FINGER SURGERY Right 11   rt middle finger  . TOTAL SHOULDER ARTHROPLASTY Right 01/21/2013   Procedure: SHOULDER HEMI ARTHROPLASTY;  Surgeon: Garald Balding, MD;  Location: [facility];  Service: Orthopedics;  Laterality: Right;  . TRANSTHORACIC ECHOCARDIOGRAM  01/2011   EF 55-60%, mild asymmetric LVH, elevated RV systolic pressure  . VASECTOMY      Family History  Problem Relation Age of Onset  . CAD Mother        MI in her 81s  . Cancer Father        colon and pancreas  . Epilepsy Son     Social History   Social History  . Marital status: Married    Spouse name: N/A  . Number of children: 4  . Years of education: N/A    Occupational History  . retired    Social History Main Topics  . Smoking status: Never Smoker  . Smokeless tobacco: Never Used     Comment: occ alcohol  . Alcohol use 0.0 oz/week     Comment: 2-3 drinks occasionally  . Drug use: No  . Sexual activity: Yes   Other Topics Concern  . Not on file   Social History Narrative   Travis Daniels lives with his wife in [place]. He is recently retired and relocated to [place] from [place]. Historically, his medical provider has been [facility] in [place].    No facility-administered medications prior to visit.    Outpatient Medications Prior to Visit  Medication Sig Dispense Refill  . albuterol (PROVENTIL HFA;VENTOLIN HFA) 108 (90 BASE) MCG/ACT inhaler Inhale 2 puffs into the lungs every 6 (six) hours as needed for wheezing or shortness of breath.    Marland Kitchen aspirin 81 MG tablet Take 81 mg by mouth daily.    . betamethasone dipropionate (DIPROLENE) 0.05 % cream Apply topically 2 (two) times daily. (Patient not taking: Reported on 08/31/2016) 30 g 1  . diltiazem (CARDIZEM CD) 300 MG 24 hr capsule Take 1 capsule (300 mg total) by  mouth daily. 90 capsule 3  . Garlic (GARLIQUE PO) Take 1 tablet by mouth daily.    Marland Kitchen glucose blood (ONE TOUCH ULTRA TEST) test strip CHECK BLOOD SUGAR TWICE DAILY 100 each 5  . nystatin (MYCOSTATIN/NYSTOP) 100000 UNIT/GM POWD APPLY TOPICALLY 4 (FOUR) TIMES DAILY. (Patient not taking: Reported on 08/31/2016) 15 g 1  . ONETOUCH DELICA LANCETS FINE MISC Check blood sugar daily 100 each 12  . valsartan (DIOVAN) 160 MG tablet Take 1 tablet (160 mg total) by mouth 2 (two) times daily. 180 tablet 3  . chlorthalidone (HYGROTON) 25 MG tablet Take 1 tablet (25 mg total) by mouth daily. 90 tablet 3  . doxycycline (VIBRA-TABS) 100 MG tablet Take 1 tablet (100 mg total) by mouth 2 (two) times daily. 20 tablet 0    No Known Allergies  Review of Systems  Constitutional: Negative for fever and malaise/fatigue.  HENT: Negative for congestion.   Eyes:  Negative for blurred vision.  Respiratory: Negative for cough and shortness of breath.   Cardiovascular: Negative for chest pain, palpitations and leg swelling.  Gastrointestinal: Negative for vomiting.  Musculoskeletal: Negative for back pain.       Right foot swollen.  Skin: Negative for rash.  Neurological: Negative for loss of consciousness and headaches.       Objective:    Physical Exam  BP 126/76 (BP Location: Right Arm, Cuff Size: Large)   Pulse 93   Temp (!) 102.1 F (38.9 C) (Oral)   Resp 16   SpO2 93%  Wt Readings from Last 3 Encounters:  08/31/16 (!) 312 lb (141.5 kg)  08/15/16 (!) 312 lb 6.4 oz (141.7 kg)  07/31/16 (!) 314 lb 11.2 oz (142.7 kg)   BP Readings from Last 3 Encounters:  08/31/16 (!) 153/98  08/31/16 126/76  08/15/16 120/80     Immunization History  Administered Date(s) Administered  . Influenza Split 11/11/2012, 12/11/2012  . Influenza, High Dose Seasonal PF 12/03/2014  . Influenza,inj,Quad PF,36+ Mos 11/21/2013  . Influenza-Unspecified 01/05/2016  . Pneumococcal Conjugate-13 11/12/2011  . Pneumococcal Polysaccharide-23 09/30/2012  . Tdap 03/25/2013  . Zoster 10/01/2012    Health Maintenance  Topic Date Due  . FOOT EXAM  05/05/2015  . HEMOGLOBIN A1C  02/26/2016  . OPHTHALMOLOGY EXAM  08/10/2016  . INFLUENZA VACCINE  10/11/2016  . TETANUS/TDAP  03/26/2023  . COLONOSCOPY  11/24/2024  . Hepatitis C Screening  Completed  . PNA vac Low Risk Adult  Completed    Lab Results  Component Value Date   WBC 16.8 (H) 08/31/2016   HGB 14.8 08/31/2016   HCT 44.1 08/31/2016   PLT 217 08/31/2016   GLUCOSE 118 (H) 08/31/2016   CHOL 160 03/09/2016   TRIG 159.0 (H) 03/09/2016   HDL 47.60 03/09/2016   LDLCALC 81 03/09/2016   ALT 16 (L) 08/31/2016   AST 21 08/31/2016   NA 137 08/31/2016   K 3.7 08/31/2016   CL 96 (L) 08/31/2016   CREATININE 1.89 (H) 08/31/2016   BUN 25 (H) 08/31/2016   CO2 30 08/31/2016   TSH 0.900 02/26/2015   PSA 4.96  (H) 02/26/2015   INR 1.21 08/31/2016   HGBA1C 5.3 08/27/2015   MICROALBUR 69.1 02/26/2015    Lab Results  Component Value Date   TSH 0.900 02/26/2015   Lab Results  Component Value Date   WBC 16.8 (H) 08/31/2016   HGB 14.8 08/31/2016   HCT 44.1 08/31/2016   MCV 84.6 08/31/2016   PLT 217 08/31/2016  Lab Results  Component Value Date   NA 137 08/31/2016   K 3.7 08/31/2016   CO2 30 08/31/2016   GLUCOSE 118 (H) 08/31/2016   BUN 25 (H) 08/31/2016   CREATININE 1.89 (H) 08/31/2016   BILITOT 3.0 (H) 08/31/2016   ALKPHOS 70 08/31/2016   AST 21 08/31/2016   ALT 16 (L) 08/31/2016   PROT 7.9 08/31/2016   ALBUMIN 4.0 08/31/2016   CALCIUM 9.5 08/31/2016   ANIONGAP 11 08/31/2016   GFR 40.79 (L) 03/09/2016   Lab Results  Component Value Date   CHOL 160 03/09/2016   Lab Results  Component Value Date   HDL 47.60 03/09/2016   Lab Results  Component Value Date   LDLCALC 81 03/09/2016   Lab Results  Component Value Date   TRIG 159.0 (H) 03/09/2016   Lab Results  Component Value Date   CHOLHDL 3 03/09/2016   Lab Results  Component Value Date   HGBA1C 5.3 08/27/2015         Assessment & Plan:   Problem List Items Addressed This Visit      Unprioritized   Cellulitis of leg, right - Primary    May also be reason for fever Pt feels terrible Will send to ER for further evaluation since he  had biopsy prostate Monday      H/O prostate biopsy    Biopsy done Monday -- now with fever 102 D/w with [facility]--they have requested pt go [facility] ER [facility]          I have discontinued Mr. Auman chlorthalidone and doxycycline. I am also having him maintain his aspirin, albuterol, ONETOUCH DELICA LANCETS FINE, diltiazem, betamethasone dipropionate, Garlic (GARLIQUE PO), nystatin, valsartan, and glucose blood.  No orders of the defined types were placed in this encounter.   CMA served as Education administrator during this visit. History, Physical and Plan performed by  medical provider. Documentation and orders reviewed and attested to.  Ann Held, DO

## 2016-08-31 NOTE — ED Triage Notes (Addendum)
Patient presents with right foot/RLE redness, swelling, and heat, starting Tuesday evening. Patient reports originally, he thought he had sprained his ankle, but when the redness and swelling increased over the past couple days, the patient saw his PCP for the issue. Patient has history of diabetes- manages with diet. Patient PCP told him to come into the ED. +pulse noted to patient bilateral lower extremities. Patient RLE elevated on pillow in triage. Patient also recently had a biopsy of his prostate (Monday 08/28/16) and he still is noting blood in his urine. Patient denies dysuria. Patient denies abdominal pain.

## 2016-09-01 ENCOUNTER — Inpatient Hospital Stay (HOSPITAL_COMMUNITY): Payer: Medicare Other

## 2016-09-01 DIAGNOSIS — M7989 Other specified soft tissue disorders: Secondary | ICD-10-CM

## 2016-09-01 DIAGNOSIS — R972 Elevated prostate specific antigen [PSA]: Secondary | ICD-10-CM

## 2016-09-01 LAB — CBC WITH DIFFERENTIAL/PLATELET
BASOS PCT: 0 %
Basophils Absolute: 0 10*3/uL (ref 0.0–0.1)
EOS ABS: 0 10*3/uL (ref 0.0–0.7)
EOS PCT: 0 %
HCT: 42.8 % (ref 39.0–52.0)
Hemoglobin: 14.4 g/dL (ref 13.0–17.0)
LYMPHS ABS: 0.6 10*3/uL — AB (ref 0.7–4.0)
Lymphocytes Relative: 4 %
MCH: 28.5 pg (ref 26.0–34.0)
MCHC: 33.6 g/dL (ref 30.0–36.0)
MCV: 84.8 fL (ref 78.0–100.0)
MONO ABS: 0.6 10*3/uL (ref 0.1–1.0)
MONOS PCT: 4 %
Neutro Abs: 14 10*3/uL — ABNORMAL HIGH (ref 1.7–7.7)
Neutrophils Relative %: 92 %
Platelets: 199 10*3/uL (ref 150–400)
RBC: 5.05 MIL/uL (ref 4.22–5.81)
RDW: 13.3 % (ref 11.5–15.5)
WBC: 15.2 10*3/uL — ABNORMAL HIGH (ref 4.0–10.5)

## 2016-09-01 LAB — COMPREHENSIVE METABOLIC PANEL
ALK PHOS: 72 U/L (ref 38–126)
ALT: 18 U/L (ref 17–63)
AST: 23 U/L (ref 15–41)
Albumin: 3.4 g/dL — ABNORMAL LOW (ref 3.5–5.0)
Anion gap: 10 (ref 5–15)
BILIRUBIN TOTAL: 1.2 mg/dL (ref 0.3–1.2)
BUN: 20 mg/dL (ref 6–20)
CALCIUM: 9.1 mg/dL (ref 8.9–10.3)
CO2: 27 mmol/L (ref 22–32)
CREATININE: 1.53 mg/dL — AB (ref 0.61–1.24)
Chloride: 101 mmol/L (ref 101–111)
GFR calc Af Amer: 51 mL/min — ABNORMAL LOW (ref >60)
GFR, EST NON AFRICAN AMERICAN: 44 mL/min — AB (ref >60)
Glucose, Bld: 171 mg/dL — ABNORMAL HIGH (ref 65–99)
POTASSIUM: 3.6 mmol/L (ref 3.5–5.1)
Sodium: 138 mmol/L (ref 135–145)
TOTAL PROTEIN: 7.3 g/dL (ref 6.5–8.1)

## 2016-09-01 LAB — GLUCOSE, CAPILLARY
GLUCOSE-CAPILLARY: 178 mg/dL — AB (ref 65–99)
Glucose-Capillary: 83 mg/dL (ref 65–99)
Glucose-Capillary: 139 mg/dL — ABNORMAL HIGH (ref 65–99)
Glucose-Capillary: 142 mg/dL — ABNORMAL HIGH (ref 65–99)

## 2016-09-01 LAB — TSH: TSH: 0.736 u[IU]/mL (ref 0.350–4.500)

## 2016-09-01 LAB — PHOSPHORUS: Phosphorus: 1.9 mg/dL — ABNORMAL LOW (ref 2.5–4.6)

## 2016-09-01 LAB — MAGNESIUM: MAGNESIUM: 1.8 mg/dL (ref 1.7–2.4)

## 2016-09-01 LAB — PROTIME-INR
INR: 1.23
PROTHROMBIN TIME: 15.6 s — AB (ref 11.4–15.2)

## 2016-09-01 MED ORDER — POTASSIUM PHOSPHATES 15 MMOLE/5ML IV SOLN
30.0000 mmol | Freq: Once | INTRAVENOUS | Status: AC
  Administered 2016-09-01: 30 mmol via INTRAVENOUS
  Filled 2016-09-01: qty 10

## 2016-09-01 MED ORDER — MORPHINE SULFATE (PF) 10 MG/ML IV SOLN: 1.0000 mg | INTRAVENOUS | Status: DC | PRN

## 2016-09-01 NOTE — Progress Notes (Signed)
Spoke with pt and wife at bedside concerning [place] and discharge needs. Wife and pt asked for RW, states that pt will go to [facility] for PT and workout in the water.

## 2016-09-01 NOTE — Progress Notes (Signed)
**  Preliminary report by tech**  Right lower extremity venous duplex complete. There is no evidence of deep or superficial vein thrombosis involving the right lower extremity. All visualized vessels appear patent and compressible. There is no evidence of a Baker's cyst on the right. Results were given to Dr. Alfredia Ferguson.  09/01/16 9:43 AM Travis Daniels RVT

## 2016-09-01 NOTE — Progress Notes (Signed)
Subjective/Chief Complaint:  1 - Low Risk Prostate Cancer - 4 cores Gleason 6 adenocarcinoma up to 90% RLB, RLA, RMA, LMA by biopsy 08/2016 on eval PSA 5.18 that rose to 6.7. TRUS 75mL no median lobe but hypoechoic Rt apex (corresponds to tumor on biopsy).   2 -Bilateral Renal Cysts - incidental bilateral <2cm renal cysts by [place] 2015 on eval very mild renal insufficiency (Cr <1.5). CT 2016 confirms non-complex.   3 - Fevers, LE Swelling After Prostate Biopsy - fevers to 102.5 3 days after prostate biopsy. He received gent / bactrim peri-BX. UA with expected pyuria / hematuria but no bacteruria. Placed on empiric Rocephin + Vanc.   PMH sig for DM2 (A1C's <6), Morbid Obesity, CHF, Umbilical hernia, Mild COPD (not limiting). No blood thinners. His PCP is Garnet Koyanagi with [facility]. He also sees Dr. Sherral Hammers at [facility].    Today " Travis Daniels " is seen in f/u above.     Objective: Vital signs in last 24 hours: Temp:  [98.9 F (37.2 C)-102.5 F (39.2 C)] 99.6 F (37.6 C) (06/22 0528) Pulse Rate:  [81-104] 81 (06/22 0528) Resp:  [15-29] 16 (06/22 0528) BP: (113-153)/(75-98) 113/79 (06/22 0528) SpO2:  [90 %-99 %] 94 % (06/22 0528) Weight:  [141.5 kg (312 lb)-141.6 kg (312 lb 2.7 oz)] 141.6 kg (312 lb 2.7 oz) (06/21 1927)    Intake/Output from previous day: 06/21 0701 - 06/22 0700 In: 2947.5 [I.V.:447.5; IV Piggyback:2500] Out: 1875 [Urine:1875] Intake/Output this shift: No intake/output data recorded.  General appearance: alert, cooperative, appears stated age and appearsr near recent baseline.  Eyes: negative Nose: Nares normal. Septum midline. Mucosa normal. No drainage or sinus tenderness. Throat: lips, mucosa, and tongue normal; teeth and gums normal Neck: supple, symmetrical, trachea midline Back: symmetric, no curvature. ROM normal. No CVA tenderness. Resp: non-labored Cardio: Nl rate GI: stable truncal obesity, no CVAT.  Male genitalia: normal Extremities: RLE edema and  mild erythema. NO calor or severe TTP.  Pulses: 2+ and symmetric Skin: Skin color, texture, turgor normal. No rashes or lesions Lymph nodes: Cervical, supraclavicular, and axillary nodes normal. Neurologic: Grossly normal  Lab Results:   Recent Labs  08/31/16 1503  WBC 16.8*  HGB 14.8  HCT 44.1  PLT 217   BMET  Recent Labs  08/31/16 1503  NA 137  K 3.7  CL 96*  CO2 30  GLUCOSE 118*  BUN 25*  CREATININE 1.89*  CALCIUM 9.5   PT/INR  Recent Labs  08/31/16 1503 09/01/16 0517  LABPROT 15.4* 15.6*  INR 1.21 1.23   ABG No results for input(s): PHART, HCO3 in the last 72 hours.  Invalid input(s): PCO2, PO2  Studies/Results: Dg Foot Complete Right  Result Date: 08/31/2016 CLINICAL DATA:  RIGHT foot and RIGHT lower extremity redness, swelling and increased warmth, beginning Tuesday evening, diabetes mellitus, no known injury EXAM: RIGHT FOOT COMPLETE - 3+ VIEW COMPARISON:  03/25/2014 FINDINGS: Diffuse soft tissue swelling. Osseous demineralization. Joint spaces preserved. Plantar and Achilles insertion calcaneal spur formation. Additional spur formation at dorsal margin of first metatarsal head at first MTP joint. No acute fracture, dislocation, or acute bone destruction. Old erosion at medial margin of the head of the proximal phalanx great toe with new erosions at the head of the first metatarsal medially. IMPRESSION: Soft tissue swelling and diffuse osseous demineralization. No acute bony abnormalities. Old erosion at IP joint great toe with new erosions at the head of the first metatarsal, cannot exclude gout. Electronically Signed  By: Lavonia Dana M.D.   On: 08/31/2016 16:55    Anti-infectives: Anti-infectives    Start     Dose/Rate Route Frequency Ordered Stop   09/01/16 1800  vancomycin (VANCOCIN) 1,500 mg in sodium chloride 0.9 % 500 mL IVPB  Status:  Discontinued     1,500 mg 250 mL/hr over 120 Minutes Intravenous Every 24 hours 08/31/16 1620 08/31/16 1929    09/01/16 0000  piperacillin-tazobactam (ZOSYN) IVPB 3.375 g  Status:  Discontinued     3.375 g 12.5 mL/hr over 240 Minutes Intravenous Every 8 hours 08/31/16 1620 08/31/16 1929   08/31/16 2100  cefTRIAXone (ROCEPHIN) 2 g in dextrose 5 % 50 mL IVPB     2 g 100 mL/hr over 30 Minutes Intravenous Every 24 hours 08/31/16 1929     08/31/16 1630  vancomycin (VANCOCIN) IVPB 1000 mg/200 mL premix     1,000 mg 200 mL/hr over 60 Minutes Intravenous NOW 08/31/16 1620 08/31/16 1750   08/31/16 1515  piperacillin-tazobactam (ZOSYN) IVPB 3.375 g     3.375 g 100 mL/hr over 30 Minutes Intravenous  Once 08/31/16 1514 08/31/16 1537   08/31/16 1515  vancomycin (VANCOCIN) IVPB 1000 mg/200 mL premix     1,000 mg 200 mL/hr over 60 Minutes Intravenous  Once 08/31/16 1514 08/31/16 1627      Assessment/Plan:  1 - Low Risk Prostate Cancer - briefly outlined natural history and management options including surveillance (outside most criteria given tumor volume), radiation (most reasonable treatment option), or surgery (not favored given level of comorbidity).   2 -Bilateral Renal Cysts - Non-complex. No indication for further evaluation.   3 - Fevers, LE Swelling After Prostate Biopsy - DDX UTI / prostate infection v. Cellulitis. Agree with current ABX pending any UCX data. Will need approx 2 week total course.   FU as scheduled 10/03/16, call with questions.    [facility], Elton Heid 09/01/2016

## 2016-09-01 NOTE — Progress Notes (Signed)
Will continue to follow for discharge needs

## 2016-09-01 NOTE — Progress Notes (Signed)
PROGRESS NOTE    Travis Daniels  EXB:[id number] DOB: 1945/05/19 DOA: 08/31/2016 PCP: Ann Held, DO  Brief Narrative:  Travis Daniels is a 71 y.o. male with medical history significant of COPD, HTN, HLD, Suspected CKD stage 3, Diet Controlled Diabetes, Obesity and other comorbidities who presented with 3 day worsening Right Leg swelling associated with some erythema and warmth. Patient states that he recently was bit by a tick on his inner thigh 3 weeks ago and went to PCP and was treated with Doxycycline. He went to the Urologist Monday for a Prostate Biopsy and states the next day on Tuesday he noticed that his Right leg started swelling significantly and had some erythema. Patient states he thought he sprained his ankle as it was a little hard to walk. Patient states that it subsequently got worse the next day and last night the patient's wife stated she felt significant warmth. He was unable to ambulate properly and stated that his foot and leg hurt so bad that he had to shuffle and walk with crutches. Because he has a hx of cellulitis patient felt as this was similar so he presented to [facility] for evaluation. Patient states that he has some urinary discomfort after his prostate biopsy and that his urine has been dark like "tea" and had blood in it. He denies any other symptoms besides his leg. Patient was found to be febrile in the ED He was worked up and [facility] was called to admit for Sepsis and Right Leg Cellulitis. Patient's leg was still swollen and was more warm today but WBC improved and Right Leg showed no DVT.    Assessment & Plan:   Principal Problem:   Sepsis ([place]) Active Problems:   Essential hypertension, benign   Venous stasis dermatitis   COPD (chronic obstructive pulmonary disease) (HCC)   Diabetes mellitus (HCC)   Cellulitis of leg, right   Cellulitis   Gout attack   Acute renal failure superimposed on chronic kidney disease (HCC)   Hematuria   Hyperlipidemia  Elevated PSA   Hypophosphatemia  Sepsis suspected to ? Lower Extremity Cellulitis in the setting of Venous Stasis vs Other Etiology -Admit to Inpatient Telemetry -Sepsis Protocol Utilized in ED -Patient was Febrile, Tachycardic, Tachypenic, had a Leukocytosis and had suspected source of Skin infection -Sepsis Physiology is improving; Leg was more warm today but not erythematous  -Has a Hx of Skin infections and states that his leg was very warm last night and that his Cellulitis starts like this -Was placed on [employer] Spectrum Abx with Vanc/Zosyn but de-escalated to IV Ceftriaxone as patient's Kidney function was elevated -Follow Blood Cx's and showed NGTD <24 hours -Unfortunately patient received IV Abx prior to getting a U/A and Urine Cx but still need to get U/A and Urine Cx -WBC on Admission was 16.8 -Checked Procalcitonin and was 0.12; Lactic Acid Level was 1.81 -Repeat CBC in AM -C/w IVF with NS at 75 mL/hr -Foot Xray showed Soft tissue swelling and diffuse osseous demineralization  Right Lower Leg Swelling from possible Cellulitis r/o other such as Gout Flare; DVT r/o'd  -Checked Venous Duplex to r/o DVT and showed no evidence of deep or superficial vein thrombosis involving the right lower extremity. All visualized vessels appear patent and compressible. There is no evidence of a Baker's cyst on the right. -Foot Xray showed Soft tissue swelling and diffuse osseous demineralization along with no acute bony abnormalities. There was Plantar and Achilles insertion calcaneal spur formation.  Additional spur formation at dorsal margin of first metatarsal head at first MTP joint. Also showed Old erosion at IP joint great toe with new erosions at the head of the first metatarsal, cannot exclude gout -Started Prednisone 0.5 mg/kg/day x 5 days along with Colchine 0.6 mg po Daily  -Will Check ESR and CRP in AM -C/w Abx As above for Cellulitis  -May need further leg imaging and Characterization  with a CT Scan or MRI if worsening   -Pain Control with Acetaminophen 650 mg po q6hprn, Tramadol 50 mg po q6hprn, and Morphine 1 mg IV q4hprn -Concerning as patient was bitten by a tick 3 weeks ago -Consider getting ECHO if not improving   Acute on Chronic Kidney Disease Stage 3 -Prior Records show no normal Cr Level and ranging from 1.2-1.5 -BUN/Cr on Admission was 25/1.89 and improved to 20/1.53 -Avoid Nephrotoxic Medication and hold home Losartan -Continue to Monitor Renal Function and repeat CMP in AM  Essential HTN -Blood Pressure currently controlled and was 113/79 -Hold Home Losartan but continue Home Cardizem of 300 mg po Daily   COPD -Currently Not in Exacerbation -Hold Home Albuterol Inhaler  -Place on DuoNebs 3 mL q6hprn for Wheezing of SOB  Hyperglycemia in the setting of Diet Controlled Type 2 Diabetes Mellitus -Place on Moderate Novolog SSI AC as BS on CMP was 118 and likely to go up with Steroid Use -Continue to Monitor CBG's; CBG's ranging from 83-178 -Checked HbA1c and is pending  Hematuria -Reported by the Patient -Likely related to Prostate Biopsy done 08/28/16 -Checked U/A and showed Large Hgb on dipstick with TNTC RBC -Urinalysis also showed TNTC WBC but no Bacteria, Negative Nitrites, and Negative Leukocytes -Urine Culture is pending -Urology is Aware   HLD -Check Lipid Panel in AM   Elevated PSA -Per Urology Note PSA was 6.7 and DRE showed 35gm smooth -08/28/2016 TRUS 69m with hypoechoic R apex - biopsy + for higher volume low grade disease at the right apex  -Urology Following and will defer to discuss results with the Patient; Dr. MTresa Mooreto discuss the Results with the patient    Hypophosphatemia -Patient's Phos Level was 1.9 -Replete with IV KPhos 30 mmol -Continue to Monitor and Replete Phos as Necessary -Repeat Phos Level in AM  DVT prophylaxis: Heparin 5,000 units sq q8h Code Status: FULL CODE Family Communication: No family present at  bedside Disposition Plan: Remain Inpatient at this Time  Consultants:   Urology was consulted by EDP; Dr. EJunious Silkevaluated the patient   Procedures  Right Lower Extremity Venous Duplex showed There is no evidence of deep or superficial vein thrombosis involving the right lower extremity. All visualized vessels appear patent and compressible. There is no evidence of a Baker's cyst on the right.   Antimicrobials: Anti-infectives    Start     Dose/Rate Route Frequency Ordered Stop   09/01/16 1800  vancomycin (VANCOCIN) 1,500 mg in sodium chloride 0.9 % 500 mL IVPB  Status:  Discontinued     1,500 mg 250 mL/hr over 120 Minutes Intravenous Every 24 hours 08/31/16 1620 08/31/16 1929   09/01/16 0000  piperacillin-tazobactam (ZOSYN) IVPB 3.375 g  Status:  Discontinued     3.375 g 12.5 mL/hr over 240 Minutes Intravenous Every 8 hours 08/31/16 1620 08/31/16 1929   08/31/16 2100  cefTRIAXone (ROCEPHIN) 2 g in dextrose 5 % 50 mL IVPB     2 g 100 mL/hr over 30 Minutes Intravenous Every 24 hours 08/31/16 1929  08/31/16 1630  vancomycin (VANCOCIN) IVPB 1000 mg/200 mL premix     1,000 mg 200 mL/hr over 60 Minutes Intravenous NOW 08/31/16 1620 08/31/16 1750   08/31/16 1515  piperacillin-tazobactam (ZOSYN) IVPB 3.375 g     3.375 g 100 mL/hr over 30 Minutes Intravenous  Once 08/31/16 1514 08/31/16 1537   08/31/16 1515  vancomycin (VANCOCIN) IVPB 1000 mg/200 mL premix     1,000 mg 200 mL/hr over 60 Minutes Intravenous  Once 08/31/16 1514 08/31/16 1627     Subjective: Seen and examined at bedside this AM and was doing ok. States leg was still swollen and stated he had a hard time bearing weight on it as it hurt. No nausea or vomiting. Was not febrile overnight. No CP or SOB.   Objective: Vitals:   08/31/16 1800 08/31/16 1830 08/31/16 1927 09/01/16 0528  BP: 138/89 119/82 (!) 153/98 113/79  Pulse: 98 91 92 81  Resp: 18 (!) 24 20 16   Temp:   98.9 F (37.2 C) 99.6 F (37.6 C)  TempSrc:    Oral Axillary  SpO2: 99% 93% 99% 94%  Weight:   (!) 141.6 kg (312 lb 2.7 oz)   Height:   6' 1"  (1.854 m)     Intake/Output Summary (Last 24 hours) at 09/01/16 1358 Last data filed at 09/01/16 1259  Gross per 24 hour  Intake           3187.5 ml  Output             2275 ml  Net            912.5 ml   Filed Weights   08/31/16 1430 08/31/16 1927  Weight: (!) 141.5 kg (312 lb) (!) 141.6 kg (312 lb 2.7 oz)   Examination: Physical Exam:  Constitutional: WN/WD obese Caucasian Male in NAD and appears calm and comfortable Eyes: Lids and conjunctivae normal, sclerae anicteric  ENMT: External Ears, Nose appear normal. Grossly normal hearing. Mucous membranes are moist.  Neck: Appears normal, supple, no cervical masses, normal ROM, no appreciable thyromegaly, no JVD Respiratory: Slightly Diminished to auscultation bilaterally, no wheezing, rales, rhonchi or crackles. Normal respiratory effort and patient is not tachypenic. No accessory muscle use.  Cardiovascular: RRR, no murmurs / rubs / gallops. S1 and S2 auscultated. 1-2+ Right lower extremity and pedal edema compared to the Left leg .  Abdomen: Soft, non-tender, non-distended. No masses palpated. No appreciable hepatosplenomegaly. Bowel sounds positive.  GU: Deferred. Musculoskeletal: No clubbing / cyanosis of digits/nails. No joint deformity upper and lower extremities.  Skin: Patient had venous stasis changes on the left leg. Right leg was warm but not as erythematous today. No induration; Warm and dry.  Neurologic: CN 2-12 grossly intact with no focal deficits. Sensation intact in all 4 Extremities. Romberg sign cerebellar reflexes not assessed.  Psychiatric: Normal judgment and insight. Alert and oriented x 3. Normal mood and appropriate affect.   Data Reviewed: I have personally reviewed following labs and imaging studies  CBC:  Recent Labs Lab 08/31/16 1503 09/01/16 1206  WBC 16.8* 15.2*  NEUTROABS 13.5* 14.0*  HGB 14.8 14.4    HCT 44.1 42.8  MCV 84.6 84.8  PLT 217 606   Basic Metabolic Panel:  Recent Labs Lab 08/31/16 1503 09/01/16 1206  NA 137 138  K 3.7 3.6  CL 96* 101  CO2 30 27  GLUCOSE 118* 171*  BUN 25* 20  CREATININE 1.89* 1.53*  CALCIUM 9.5 9.1  MG  --  1.8  PHOS  --  1.9*   GFR: Estimated Creatinine Clearance: 65.5 mL/min (A) (by C-G formula based on SCr of 1.53 mg/dL (H)). Liver Function Tests:  Recent Labs Lab 08/31/16 1503 09/01/16 1206  AST 21 23  ALT 16* 18  ALKPHOS 70 72  BILITOT 3.0* 1.2  PROT 7.9 7.3  ALBUMIN 4.0 3.4*   No results for input(s): LIPASE, AMYLASE in the last 168 hours. No results for input(s): AMMONIA in the last 168 hours. Coagulation Profile:  Recent Labs Lab 08/31/16 1503 09/01/16 0517  INR 1.21 1.23   Cardiac Enzymes: No results for input(s): CKTOTAL, CKMB, CKMBINDEX, TROPONINI in the last 168 hours. BNP (last 3 results) No results for input(s): PROBNP in the last 8760 hours. HbA1C: No results for input(s): HGBA1C in the last 72 hours. CBG:  Recent Labs Lab 08/31/16 2307 09/01/16 0756 09/01/16 1211  GLUCAP 117* 83 178*   Lipid Profile: No results for input(s): CHOL, HDL, LDLCALC, TRIG, CHOLHDL, LDLDIRECT in the last 72 hours. Thyroid Function Tests:  Recent Labs  09/01/16 0517  TSH 0.736   Anemia Panel: No results for input(s): VITAMINB12, FOLATE, FERRITIN, TIBC, IRON, RETICCTPCT in the last 72 hours. Sepsis Labs:  Recent Labs Lab 08/31/16 1511 08/31/16 1950  PROCALCITON  --  0.12  LATICACIDVEN 1.81  --     Recent Results (from the past 240 hour(s))  Culture, blood (Routine x 2)     Status: None (Preliminary result)   Collection Time: 08/31/16  3:03 PM  Result Value Ref Range Status   Specimen Description BLOOD BLOOD RIGHT FOREARM  Final   Special Requests   Final    BOTTLES DRAWN AEROBIC AND ANAEROBIC Blood Culture adequate volume   Culture   Final    NO GROWTH < 24 HOURS Performed at [facility],  [place] [address], [place], [place] [address]    Report Status PENDING  Incomplete     Radiology Studies: Dg Foot Complete Right  Result Date: 08/31/2016 CLINICAL DATA:  RIGHT foot and RIGHT lower extremity redness, swelling and increased warmth, beginning Tuesday evening, diabetes mellitus, no known injury EXAM: RIGHT FOOT COMPLETE - 3+ VIEW COMPARISON:  03/25/2014 FINDINGS: Diffuse soft tissue swelling. Osseous demineralization. Joint spaces preserved. Plantar and Achilles insertion calcaneal spur formation. Additional spur formation at dorsal margin of first metatarsal head at first MTP joint. No acute fracture, dislocation, or acute bone destruction. Old erosion at medial margin of the head of the proximal phalanx great toe with new erosions at the head of the first metatarsal medially. IMPRESSION: Soft tissue swelling and diffuse osseous demineralization. No acute bony abnormalities. Old erosion at IP joint great toe with new erosions at the head of the first metatarsal, cannot exclude gout. Electronically Signed   By: Lavonia Dana M.D.   On: 08/31/2016 16:55   Scheduled Meds: . aspirin EC  81 mg Oral Daily  . colchicine  0.6 mg Oral Daily  . diltiazem  300 mg Oral Daily  . heparin  5,000 Units Subcutaneous Q8H  . insulin aspart  0-15 Units Subcutaneous TID WC  . predniSONE  70 mg Oral Q breakfast  . sodium chloride flush  3 mL Intravenous Q12H   Continuous Infusions: . sodium chloride 75 mL/hr at 09/01/16 0847  . cefTRIAXone (ROCEPHIN)  IV Stopped (08/31/16 2101)  . potassium phosphate IVPB (mmol)      LOS: 1 day   Kerney Elbe, DO [facility] Pager [phone]  If 7PM-7AM,  please contact night-coverage [email or web address] Password [email or web address] 09/01/2016, 1:58 PM

## 2016-09-01 NOTE — Progress Notes (Signed)
Pt. Placed on CPAP for h/s with M/FFM, humidity filled on room air, tolerating well, RT to monitor,.

## 2016-09-02 LAB — PHOSPHORUS: PHOSPHORUS: 3.6 mg/dL (ref 2.5–4.6)

## 2016-09-02 LAB — CBC WITH DIFFERENTIAL/PLATELET
BASOS PCT: 0 %
Basophils Absolute: 0 10*3/uL (ref 0.0–0.1)
EOS ABS: 0.1 10*3/uL (ref 0.0–0.7)
EOS PCT: 0 %
HCT: 39.8 % (ref 39.0–52.0)
Hemoglobin: 13.4 g/dL (ref 13.0–17.0)
LYMPHS ABS: 1.9 10*3/uL (ref 0.7–4.0)
Lymphocytes Relative: 13 %
MCH: 28.6 pg (ref 26.0–34.0)
MCHC: 33.7 g/dL (ref 30.0–36.0)
MCV: 85 fL (ref 78.0–100.0)
MONO ABS: 1.6 10*3/uL — AB (ref 0.1–1.0)
MONOS PCT: 10 %
Neutro Abs: 11.6 10*3/uL — ABNORMAL HIGH (ref 1.7–7.7)
Neutrophils Relative %: 77 %
PLATELETS: 236 10*3/uL (ref 150–400)
RBC: 4.68 MIL/uL (ref 4.22–5.81)
RDW: 13.4 % (ref 11.5–15.5)
WBC: 15.1 10*3/uL — ABNORMAL HIGH (ref 4.0–10.5)

## 2016-09-02 LAB — COMPREHENSIVE METABOLIC PANEL
ALBUMIN: 3.1 g/dL — AB (ref 3.5–5.0)
ALK PHOS: 64 U/L (ref 38–126)
ALT: 15 U/L — ABNORMAL LOW (ref 17–63)
ANION GAP: 10 (ref 5–15)
AST: 15 U/L (ref 15–41)
BUN: 22 mg/dL — ABNORMAL HIGH (ref 6–20)
CO2: 30 mmol/L (ref 22–32)
Calcium: 8.9 mg/dL (ref 8.9–10.3)
Chloride: 100 mmol/L — ABNORMAL LOW (ref 101–111)
Creatinine, Ser: 1.54 mg/dL — ABNORMAL HIGH (ref 0.61–1.24)
GFR calc non Af Amer: 44 mL/min — ABNORMAL LOW (ref >60)
GFR, EST AFRICAN AMERICAN: 51 mL/min — AB (ref >60)
Glucose, Bld: 110 mg/dL — ABNORMAL HIGH (ref 65–99)
POTASSIUM: 3.6 mmol/L (ref 3.5–5.1)
SODIUM: 140 mmol/L (ref 135–145)
Total Bilirubin: 0.5 mg/dL (ref 0.3–1.2)
Total Protein: 6.8 g/dL (ref 6.5–8.1)

## 2016-09-02 LAB — GLUCOSE, CAPILLARY
GLUCOSE-CAPILLARY: 91 mg/dL (ref 65–99)
GLUCOSE-CAPILLARY: 111 mg/dL — AB (ref 65–99)
GLUCOSE-CAPILLARY: 126 mg/dL — AB (ref 65–99)
Glucose-Capillary: 127 mg/dL — ABNORMAL HIGH (ref 65–99)

## 2016-09-02 LAB — LIPID PANEL
CHOL/HDL RATIO: 2.7 ratio
CHOLESTEROL: 123 mg/dL (ref 0–200)
HDL: 45 mg/dL (ref >40)
LDL Cholesterol: 65 mg/dL (ref 0–99)
TRIGLYCERIDES: 67 mg/dL (ref <150)
VLDL: 13 mg/dL (ref 0–40)

## 2016-09-02 LAB — SEDIMENTATION RATE: Sed Rate: 67 mm/hr — ABNORMAL HIGH (ref 0–16)

## 2016-09-02 LAB — URINE CULTURE: CULTURE: NO GROWTH

## 2016-09-02 LAB — HEMOGLOBIN A1C
Hgb A1c MFr Bld: 5.2 % (ref 4.8–5.6)
MEAN PLASMA GLUCOSE: 103 mg/dL

## 2016-09-02 LAB — MAGNESIUM: Magnesium: 2 mg/dL (ref 1.7–2.4)

## 2016-09-02 LAB — C-REACTIVE PROTEIN: CRP: 18.8 mg/dL — AB (ref <1.0)

## 2016-09-02 NOTE — Progress Notes (Signed)
PROGRESS NOTE    Travis Daniels  OXB:[id number] DOB: November 09, 1945 DOA: 08/31/2016 PCP: Ann Held, DO  Brief Narrative:  Travis Daniels is a 71 y.o. male with medical history significant of COPD, HTN, HLD, Suspected CKD stage 3, Diet Controlled Diabetes, Obesity and other comorbidities who presented with 3 day worsening Right Leg swelling associated with some erythema and warmth. Patient states that he recently was bit by a tick on his inner thigh 3 weeks ago and went to PCP and was treated with Doxycycline. He went to the Urologist Monday for a Prostate Biopsy and states the next day on Tuesday he noticed that his Right leg started swelling significantly and had some erythema. Patient states he thought he sprained his ankle as it was a little hard to walk. Patient states that it subsequently got worse the next day and last night the patient's wife stated she felt significant warmth. He was unable to ambulate properly and stated that his foot and leg hurt so bad that he had to shuffle and walk with crutches. Because he has a hx of cellulitis patient felt as this was similar so he presented to [facility] for evaluation. Patient states that he has some urinary discomfort after his prostate biopsy and that his urine has been dark like "tea" and had blood in it. He denies any other symptoms besides his leg. Patient was found to be febrile in the ED He was worked up and [facility] was called to admit for Sepsis and Right Leg Cellulitis. Patient's leg was improved today and was less erythematous and swollen today. Patient feels as if he is 20% better.   Assessment & Plan:   Principal Problem:   Sepsis ([place]) Active Problems:   Essential hypertension, benign   Venous stasis dermatitis   COPD (chronic obstructive pulmonary disease) (HCC)   Diabetes mellitus (HCC)   Cellulitis of leg, right   Cellulitis   Gout attack   Acute renal failure superimposed on chronic kidney disease (HCC)   Hematuria  Hyperlipidemia   Elevated PSA   Hypophosphatemia  Sepsis suspected to ? Lower Extremity Cellulitis in the setting of Venous Stasis vs Other Etiology -Admitted to Inpatient Telemetry -Sepsis Protocol Utilized in ED -Patient was Febrile, Tachycardic, Tachypenic, had a Leukocytosis and had suspected source of Skin infection -Sepsis Physiology is improving; Leg was more warm yesterday but not erythematous and today the swelling is improving  -Has a Hx of Skin infections and states that his leg was very warm last night and that his Cellulitis starts like this -Was placed on Broad Spectrum Abx with Vanc/Zosyn but de-escalated to IV Ceftriaxone as patient's Kidney function was elevated; May need to broaden if not improving  -Follow Blood Cx's and showed NGTD at 2 Days -Unfortunately patient received IV Abx prior to getting a U/A and Urine Cx but still need to get U/A and Urine Cx -WBC on Admission was 16.8; Improved to 15.2 -> 15.1 -Checked Procalcitonin and was 0.12; Lactic Acid Level was 1.81 -Repeat CBC in AM -C/w IVF with NS at 50 mL/hr and D/C later on this evening.  -Foot Xray showed Soft tissue swelling and diffuse osseous demineralization  Right Lower Leg Swelling from possible Cellulitis r/o other such as Gout Flare; DVT r/o'd; Less likely UTI but possibly Prostate infection  -Checked Venous Duplex to r/o DVT and showed no evidence of deep or superficial vein thrombosis involving the right lower extremity. All visualized vessels appear patent and compressible. There is  no evidence of a Baker's cyst on the right. -Foot Xray showed Soft tissue swelling and diffuse osseous demineralization along with no acute bony abnormalities. There was Plantar and Achilles insertion calcaneal spur formation. Additional spur formation at dorsal margin of first metatarsal head at first MTP joint. Also showed Old erosion at IP joint great toe with new erosions at the head of the first metatarsal, cannot exclude  gout -Started Prednisone 0.5 mg/kg/day x 5 days (Day 3/5) along with Colchine 0.6 mg po Daily  -ESR was 67 and CRP was 18.8 -C/w Abx As above for Cellulitis and ? UTI/Prostate Infection  -May need further leg imaging and Characterization with a CT Scan or MRI if worsening   -Pain Control with Acetaminophen 650 mg po q6hprn, Tramadol 50 mg po q6hprn, and Morphine 1 mg IV q4hprn -Concerning as patient was bitten by a tick 3 weeks ago -Consider getting ECHO if not improving   Acute on Chronic Kidney Disease Stage 3 -Prior Records show no normal Cr Level and ranging from 1.2-1.5 -BUN/Cr on Admission was 25/1.89 and went to 20/1.53 -> 22/1.54 -Avoid Nephrotoxic Medication and hold home Losartan -Continue to Monitor Renal Function and repeat CMP in AM  Essential HTN -Blood Pressure currently controlled and was 134/69 -Hold Home Losartan but continue Home Cardizem of 300 mg po Daily   COPD -Currently Not in Exacerbation -Hold Home Albuterol Inhaler  -Place on DuoNebs 3 mL q6hprn for Wheezing of SOB  Hyperglycemia in the setting of Diet Controlled Type 2 Diabetes Mellitus -Place on Moderate Novolog SSI AC as BS on CMP was 118 and likely to go up with Steroid Use -Continue to Monitor CBG's; CBG's ranging from 91-142 -Checked HbA1c and is pending  Hematuria -Reported by the Patient -Likely related to Prostate Biopsy done 08/28/16 -Checked U/A and showed Large Hgb on dipstick with TNTC RBC -Urinalysis also showed TNTC WBC but no Bacteria, Negative Nitrites, and Negative Leukocytes -Urine Culture is pending -Urology is Aware   HLD -Checked Lipid Panel and it showed Cholesterol of 123, HDL of 45, LDL of 65, TG of 67, and VLDL 13  Elevated PSA/Low Risk Prostate Cancer -Per Urology Note PSA was 6.7 and DRE showed 35gm smooth -08/28/2016 TRUS 38m with hypoechoic R apex - biopsy + for higher volume low grade disease at the right apex  -Urology Following and will defer to discuss  results with the Patient; Dr. MTresa Moorediscussed with the patient -Options were given including surveillance, radiatio, and surgery and Dr. MTresa Moorefeels as if Radiation is the most Reasonable option    Hypophosphatemia -Patient's Phos Level was 1.9 and improved to 3.6 -Replete with IV KPhos 30 mmol yesterday  -Continue to Monitor and Replete Phos as Necessary -Repeat Phos Level in AM  DVT prophylaxis: Heparin 5,000 units sq q8h Code Status: FULL CODE Family Communication: No family present at bedside Disposition Plan: Remain Inpatient at this Time  Consultants:   Urology was consulted by EDP; Dr. EJunious Silkevaluated the patient   Procedures  Right Lower Extremity Venous Duplex showed There is no evidence of deep or superficial vein thrombosis involving the right lower extremity. All visualized vessels appear patent and compressible. There is no evidence of a Baker's cyst on the right.   Antimicrobials: Anti-infectives    Start     Dose/Rate Route Frequency Ordered Stop   09/01/16 1800  vancomycin (VANCOCIN) 1,500 mg in sodium chloride 0.9 % 500 mL IVPB  Status:  Discontinued     1,500  mg 250 mL/hr over 120 Minutes Intravenous Every 24 hours 08/31/16 1620 08/31/16 1929   09/01/16 0000  piperacillin-tazobactam (ZOSYN) IVPB 3.375 g  Status:  Discontinued     3.375 g 12.5 mL/hr over 240 Minutes Intravenous Every 8 hours 08/31/16 1620 08/31/16 1929   08/31/16 2100  cefTRIAXone (ROCEPHIN) 2 g in dextrose 5 % 50 mL IVPB     2 g 100 mL/hr over 30 Minutes Intravenous Every 24 hours 08/31/16 1929     08/31/16 1630  vancomycin (VANCOCIN) IVPB 1000 mg/200 mL premix     1,000 mg 200 mL/hr over 60 Minutes Intravenous NOW 08/31/16 1620 08/31/16 1750   08/31/16 1515  piperacillin-tazobactam (ZOSYN) IVPB 3.375 g     3.375 g 100 mL/hr over 30 Minutes Intravenous  Once 08/31/16 1514 08/31/16 1537   08/31/16 1515  vancomycin (VANCOCIN) IVPB 1000 mg/200 mL premix     1,000 mg 200 mL/hr over 60  Minutes Intravenous  Once 08/31/16 1514 08/31/16 1627     Subjective: Seen and examined at bedside this AM and was doing better. No nausea or vomiting. No CP. Feels as if Leg is improving and states it was less swollen today.   Objective: Vitals:   09/01/16 2036 09/01/16 2323 09/02/16 0500 09/02/16 0815  BP: 126/79  121/82 134/69  Pulse: 66 67 63 77  Resp: _0 Temp: 99.5 F (37.5 C)  97.8 F (36.6 C) 98.8 F (37.1 C)  TempSrc: Oral  Axillary Oral  SpO2: 95% 93% 94% 97%  Weight:   (!) 143.3 kg (315 lb 14.7 oz) (!) 143.3 kg (315 lb 14.7 oz)  Height:        Intake/Output Summary (Last 24 hours) at 09/02/16 1349 Last data filed at 09/02/16 0830  Gross per 24 hour  Intake          2578.33 ml  Output             1500 ml  Net          1078.33 ml   Filed Weights   08/31/16 1927 09/02/16 0500 09/02/16 0815  Weight: (!) 141.6 kg (312 lb 2.7 oz) (!) 143.3 kg (315 lb 14.7 oz) (!) 143.3 kg (315 lb 14.7 oz)   Examination: Physical Exam:  Constitutional: Pleasant obese Caucasian male in NAD appears calm and comfortable laying in bed.  Eyes: Sclerae anicteric. Conjunctivae non-injected ENMT: Grossly normal hearing. External ears and nose appear normal.  Neck: Supple with no JVD Respiratory: CTAB with no wheezing, rales, rhonchi. Patient was not tachypenic or using any accessory muscles to breathe.  Cardiovascular: RRR. S1 S2. 1+ Lower Extremity edema on Right leg with some erythema Abdomen: Soft, NT, Distended due to body habitus. Bowel sounds present GU: Deferred Musculoskeletal: No contractures. No cyanosis Skin: Warm and Dry. Left leg changes from venous stasis. Right leg less erythematous but still warm. Less swollen leg Neurologic: CN 2-12 grossly intact. No focal deficits Psychiatric: Pleasant mood and affect. Intact judgement and insight  Data Reviewed: I have personally reviewed following labs and imaging studies  CBC:  Recent Labs Lab 08/31/16 1503  09/01/16 1206 09/02/16 0528  WBC 16.8* 15.2* 15.1*  NEUTROABS 13.5* 14.0* 11.6*  HGB 14.8 14.4 13.4  HCT 44.1 42.8 39.8  MCV 84.6 84.8 85.0  PLT 217 199 354   Basic Metabolic Panel:  Recent Labs Lab 08/31/16 1503 09/01/16 1206 09/02/16 0528  NA 137 138 140  K 3.7 3.6 3.6  CL 96*  101 100*  CO2 _0 GLUCOSE 118* 171* 110*  BUN 25* 20 22*  CREATININE 1.89* 1.53* 1.54*  CALCIUM 9.5 9.1 8.9  MG  --  1.8 2.0  PHOS  --  1.9* 3.6   GFR: Estimated Creatinine Clearance: 65.5 mL/min (A) (by C-G formula based on SCr of 1.54 mg/dL (H)). Liver Function Tests:  Recent Labs Lab 08/31/16 1503 09/01/16 1206 09/02/16 0528  AST _1 ALT 16* 18 15*  ALKPHOS 70 72 64  BILITOT 3.0* 1.2 0.5  PROT 7.9 7.3 6.8  ALBUMIN 4.0 3.4* 3.1*   No results for input(s): LIPASE, AMYLASE in the last 168 hours. No results for input(s): AMMONIA in the last 168 hours. Coagulation Profile:  Recent Labs Lab 08/31/16 1503 09/01/16 0517  INR 1.21 1.23   Cardiac Enzymes: No results for input(s): CKTOTAL, CKMB, CKMBINDEX, TROPONINI in the last 168 hours. BNP (last 3 results) No results for input(s): PROBNP in the last 8760 hours. HbA1C:  Recent Labs  09/01/16 0517  HGBA1C 5.2   CBG:  Recent Labs Lab 09/01/16 1211 09/01/16 1702 09/01/16 2150 09/02/16 0813 09/02/16 1204  GLUCAP 178* 139* 142* 91 111*   Lipid Profile:  Recent Labs  09/02/16 0528  CHOL 123  HDL 45  LDLCALC 65  TRIG 67  CHOLHDL 2.7   Thyroid Function Tests:  Recent Labs  09/01/16 0517  TSH 0.736   Anemia Panel: No results for input(s): VITAMINB12, FOLATE, FERRITIN, TIBC, IRON, RETICCTPCT in the last 72 hours. Sepsis Labs:  Recent Labs Lab 08/31/16 1511 08/31/16 1950  PROCALCITON  --  0.12  LATICACIDVEN 1.81  --     Recent Results (from the past 240 hour(s))  Culture, blood (Routine x 2)     Status: None (Preliminary result)   Collection Time: 08/31/16  3:03 PM  Result Value Ref Range  Status   Specimen Description BLOOD BLOOD RIGHT FOREARM  Final   Special Requests   Final    BOTTLES DRAWN AEROBIC AND ANAEROBIC Blood Culture adequate volume   Culture   Final    NO GROWTH 2 DAYS Performed at [facility], [place] [address] [place], [place] [address]    Report Status PENDING  Incomplete  Urine culture     Status: None   Collection Time: 08/31/16  3:15 PM  Result Value Ref Range Status   Specimen Description URINE, CLEAN CATCH  Final   Special Requests NONE  Final   Culture   Final    NO GROWTH Performed at [facility], [place] [address], [place], [place] [address]    Report Status 09/02/2016 FINAL  Final     Radiology Studies: Dg Foot Complete Right  Result Date: 08/31/2016 CLINICAL DATA:  RIGHT foot and RIGHT lower extremity redness, swelling and increased warmth, beginning Tuesday evening, diabetes mellitus, no known injury EXAM: RIGHT FOOT COMPLETE - 3+ VIEW COMPARISON:  03/25/2014 FINDINGS: Diffuse soft tissue swelling. Osseous demineralization. Joint spaces preserved. Plantar and Achilles insertion calcaneal spur formation. Additional spur formation at dorsal margin of first metatarsal head at first MTP joint. No acute fracture, dislocation, or acute bone destruction. Old erosion at medial margin of the head of the proximal phalanx great toe with new erosions at the head of the first metatarsal medially. IMPRESSION: Soft tissue swelling and diffuse osseous demineralization. No acute bony abnormalities. Old erosion at IP joint great toe with new erosions at the head of the first metatarsal, cannot exclude  gout. Electronically Signed   By: Lavonia Dana M.D.   On: 08/31/2016 16:55   Scheduled Meds: . aspirin EC  81 mg Oral Daily  . colchicine  0.6 mg Oral Daily  . diltiazem  300 mg Oral Daily  . heparin  5,000 Units Subcutaneous Q8H  . insulin aspart  0-15 Units Subcutaneous TID WC  . predniSONE  70 mg Oral Q breakfast  . sodium chloride flush  3 mL  Intravenous Q12H   Continuous Infusions: . sodium chloride 50 mL/hr at 09/02/16 0639  . cefTRIAXone (ROCEPHIN)  IV Stopped (09/01/16 2157)    LOS: 2 days   Kerney Elbe, DO [facility] Pager [phone]  If 7PM-7AM, please contact night-coverage [email or web address] Password [email or web address] 09/02/2016, 1:49 PM

## 2016-09-02 NOTE — Progress Notes (Signed)
Pt and wife requested home PT when pt is able to discharged, spoke with care manager on 6/22 and requested rolling walker, now in addition would like Home Pt if eligible. SRP, RN

## 2016-09-03 DIAGNOSIS — R195 Other fecal abnormalities: Secondary | ICD-10-CM

## 2016-09-03 DIAGNOSIS — E876 Hypokalemia: Secondary | ICD-10-CM

## 2016-09-03 LAB — COMPREHENSIVE METABOLIC PANEL
ALT: 29 U/L (ref 17–63)
AST: 28 U/L (ref 15–41)
Albumin: 3 g/dL — ABNORMAL LOW (ref 3.5–5.0)
Alkaline Phosphatase: 59 U/L (ref 38–126)
Anion gap: 10 (ref 5–15)
BUN: 24 mg/dL — AB (ref 6–20)
CALCIUM: 8.8 mg/dL — AB (ref 8.9–10.3)
CHLORIDE: 101 mmol/L (ref 101–111)
CO2: 29 mmol/L (ref 22–32)
CREATININE: 1.37 mg/dL — AB (ref 0.61–1.24)
GFR calc non Af Amer: 50 mL/min — ABNORMAL LOW (ref >60)
GFR, EST AFRICAN AMERICAN: 58 mL/min — AB (ref >60)
GLUCOSE: 103 mg/dL — AB (ref 65–99)
Potassium: 3.4 mmol/L — ABNORMAL LOW (ref 3.5–5.1)
SODIUM: 140 mmol/L (ref 135–145)
Total Bilirubin: 0.6 mg/dL (ref 0.3–1.2)
Total Protein: 6.6 g/dL (ref 6.5–8.1)

## 2016-09-03 LAB — CBC WITH DIFFERENTIAL/PLATELET
Basophils Absolute: 0 10*3/uL (ref 0.0–0.1)
Basophils Relative: 0 %
EOS ABS: 0.1 10*3/uL (ref 0.0–0.7)
EOS PCT: 0 %
HCT: 39 % (ref 39.0–52.0)
HEMOGLOBIN: 13.3 g/dL (ref 13.0–17.0)
LYMPHS ABS: 1.7 10*3/uL (ref 0.7–4.0)
LYMPHS PCT: 13 %
MCH: 28.6 pg (ref 26.0–34.0)
MCHC: 34.1 g/dL (ref 30.0–36.0)
MCV: 83.9 fL (ref 78.0–100.0)
Monocytes Absolute: 1.4 10*3/uL — ABNORMAL HIGH (ref 0.1–1.0)
Monocytes Relative: 10 %
NEUTROS PCT: 77 %
Neutro Abs: 10.5 10*3/uL — ABNORMAL HIGH (ref 1.7–7.7)
PLATELETS: 242 10*3/uL (ref 150–400)
RBC: 4.65 MIL/uL (ref 4.22–5.81)
RDW: 13.2 % (ref 11.5–15.5)
WBC: 13.7 10*3/uL — AB (ref 4.0–10.5)

## 2016-09-03 LAB — PHOSPHORUS: PHOSPHORUS: 3 mg/dL (ref 2.5–4.6)

## 2016-09-03 LAB — MAGNESIUM: Magnesium: 2.1 mg/dL (ref 1.7–2.4)

## 2016-09-03 LAB — GLUCOSE, CAPILLARY
GLUCOSE-CAPILLARY: 120 mg/dL — AB (ref 65–99)
Glucose-Capillary: 91 mg/dL (ref 65–99)
Glucose-Capillary: 94 mg/dL (ref 65–99)
Glucose-Capillary: 132 mg/dL — ABNORMAL HIGH (ref 65–99)

## 2016-09-03 MED ORDER — POTASSIUM CHLORIDE CRYS ER 20 MEQ PO TBCR
40.0000 meq | EXTENDED_RELEASE_TABLET | Freq: Two times a day (BID) | ORAL | Status: AC
  Administered 2016-09-03 (×2): 40 meq via ORAL
  Filled 2016-09-03 (×2): qty 2

## 2016-09-03 NOTE — Evaluation (Signed)
Physical Therapy Evaluation Patient Details Name: Travis Daniels MRN: [id number] DOB: Jun 06, 1945 Today's Date: 09/03/2016   History of Present Illness  71 yo male admitted with sepsis, R LE pain-? cellulitis vs gout. Hx of DM, COPD, pulm HTN, arthritis, LE edema, gout, venous stasis.   Clinical Impression  On eval, pt required int Min assist for mobility. He walked ~85 feet while holding on to an IV pole. Pain rated 4/10 with activity. Pt participated well. Will follow during hospital stay. Recommend HHPT f/u at discharge.     Follow Up Recommendations Home health PT    Equipment Recommendations  Rolling walker with 5" wheels (pt stated they have already ordered one???)    Recommendations for Other Services       Precautions / Restrictions Precautions Precautions: Fall Restrictions Weight Bearing Restrictions: No      Mobility  Bed Mobility               General bed mobility comments: pt sitting EOB  Transfers Overall transfer level: Needs assistance Equipment used: Rolling walker (2 wheeled) Transfers: Sit to/from Stand Sit to Stand: Min guard;From elevated surface         General transfer comment: close guard for safety. Increased time.   Ambulation/Gait Ambulation/Gait assistance: Min assist Ambulation Distance (Feet): 85 Feet Assistive device:  (IV pole) Gait Pattern/deviations: Step-through pattern;Decreased stride length     General Gait Details: slow gait speed. Intermittent assist to steady. Pt tolerated distance fairly well.  Stairs            Wheelchair Mobility    Modified Rankin (Stroke Patients Only)       Balance                                             Pertinent Vitals/Pain Pain Assessment: 0-10 Pain Score: 4  Pain Location: arches of both feet, both knees Pain Descriptors / Indicators: Aching;Sore Pain Intervention(s): Limited activity within patient's tolerance;Repositioned    Home Living  Family/patient expects to be discharged to:: Private residence Living Arrangements: Spouse/significant other Available Help at Discharge: Family Type of Home: House Home Access: Stairs to enter Entrance Stairs-Rails: Right;Left;Can reach both Entrance Stairs-Number of Steps: 3 steps Home Layout: Two level;Able to live on main level with bedroom/bathroom Home Equipment: None      Prior Function Level of Independence: Independent               Hand Dominance        Extremity/Trunk Assessment   Upper Extremity Assessment Upper Extremity Assessment: Generalized weakness    Lower Extremity Assessment Lower Extremity Assessment: Generalized weakness    Cervical / Trunk Assessment Cervical / Trunk Assessment: Normal  Communication   Communication: No difficulties  Cognition Arousal/Alertness: Awake/alert Behavior During Therapy: WFL for tasks assessed/performed Overall Cognitive Status: Within Functional Limits for tasks assessed                                        General Comments      Exercises     Assessment/Plan    PT Assessment Patient needs continued PT services  PT Problem List Decreased mobility;Decreased activity tolerance;Decreased balance;Decreased knowledge of use of DME;Pain;Obesity       PT Treatment Interventions DME instruction;Gait training;Therapeutic activities;Therapeutic  exercise;Patient/family education;Balance training;Functional mobility training    PT Goals (Current goals can be found in the Care Plan section)  Acute Rehab PT Goals Patient Stated Goal: home. less pain PT Goal Formulation: With patient Time For Goal Achievement: 09/17/16 Potential to Achieve Goals: Good    Frequency Min 3X/week   Barriers to discharge        Co-evaluation               AM-PAC PT "6 Clicks" Daily Activity  Outcome Measure Difficulty turning over in bed (including adjusting bedclothes, sheets and blankets)?: A  Little Difficulty moving from lying on back to sitting on the side of the bed? : A Little Difficulty sitting down on and standing up from a chair with arms (e.g., wheelchair, bedside commode, etc,.)?: A Little Help needed moving to and from a bed to chair (including a wheelchair)?: A Little Help needed walking in hospital room?: A Little Help needed climbing 3-5 steps with a railing? : A Little 6 Click Score: 18    End of Session   Activity Tolerance: Patient tolerated treatment well Patient left: in bed;with call bell/phone within reach   PT Visit Diagnosis: Muscle weakness (generalized) (M62.81);Difficulty in walking, not elsewhere classified (R26.2)    Time: 0923-3007 PT Time Calculation (min) (ACUTE ONLY): 23 min   Charges:   PT Evaluation $PT Eval Low Complexity: 1 Procedure     PT G Codes:         Weston Anna, MPT Pager: [phone]

## 2016-09-03 NOTE — Progress Notes (Signed)
PROGRESS NOTE    Travis Daniels  DVV:[id number] DOB: February 27, 1946 DOA: 08/31/2016 PCP: Ann Held, DO  Brief Narrative:  Travis Daniels is a 71 y.o. male with medical history significant of COPD, HTN, HLD, Suspected CKD stage 3, Diet Controlled Diabetes, Obesity and other comorbidities who presented with 3 day worsening Right Leg swelling associated with some erythema and warmth. Patient states that he recently was bit by a tick on his inner thigh 3 weeks ago and went to PCP and was treated with Doxycycline. He went to the Urologist Monday for a Prostate Biopsy and states the next day on Tuesday he noticed that his Right leg started swelling significantly and had some erythema. Patient states he thought he sprained his ankle as it was a little hard to walk. Patient states that it subsequently got worse the next day and last night the patient's wife stated she felt significant warmth. He was unable to ambulate properly and stated that his foot and leg hurt so bad that he had to shuffle and walk with crutches. Because he has a hx of cellulitis patient felt as this was similar so he presented to [facility] for evaluation. Patient states that he has some urinary discomfort after his prostate biopsy and that his urine has been dark like "tea" and had blood in it. He denies any other symptoms besides his leg. Patient was found to be febrile in the ED He was worked up and [facility] was called to admit for Sepsis and Right Leg Cellulitis. Patient feels leg is improving and is less erythematous, swollen and less painful. Patient able to bear weight on it but just feels weak.    Assessment & Plan:   Principal Problem:   Sepsis ([place]) Active Problems:   Essential hypertension, benign   Venous stasis dermatitis   COPD (chronic obstructive pulmonary disease) (HCC)   Diabetes mellitus (HCC)   Cellulitis of leg, right   Cellulitis   Gout attack   Acute renal failure superimposed on chronic kidney disease  (HCC)   Hematuria   Hyperlipidemia   Elevated PSA   Hypophosphatemia  Sepsis suspected to ? Lower Extremity Cellulitis in the setting of Venous Stasis vs Other Etiology -Admitted to Inpatient Telemetry -Sepsis Protocol Utilized in ED -Patient was Febrile, Tachycardic, Tachypenic, had a Leukocytosis and had suspected source of Skin infection -Sepsis Physiology is improving; Leg not as warm or erythematous today and swelling has decreased moderately  -Has a Hx of Skin infections and states that his leg was very warm last night and that his Cellulitis starts like this -Was placed on Broad Spectrum Abx with Vanc/Zosyn but de-escalated to IV Ceftriaxone as patient's Kidney function was elevated; May need to broaden if not improving  -Follow Blood Cx's and showed NGTD at 3 Days -Unfortunately patient received IV Abx prior to getting a U/A and Urine Cx but still need to get U/A and Urine Cx -WBC on Admission was 16.8; Improved to 15.2 -> 15.1 -> 13.7 -Checked Procalcitonin and was 0.12; Lactic Acid Level was 1.81 -Repeat CBC in AM -D/C  IVF with NS at 50 mL/hr.  -Foot Xray showed Soft tissue swelling and diffuse osseous demineralization -Likely transition to po Abx in AM -PT/OT Evaluated for Treatment and Evaluation   Right Lower Leg Swelling from possible Cellulitis r/o other such as Gout Flare; DVT r/o'd; Less likely UTI but possibly ? Prostate infection  -Checked Venous Duplex to r/o DVT and showed no evidence of deep or  superficial vein thrombosis involving the right lower extremity. All visualized vessels appear patent and compressible. There is no evidence of a Baker's cyst on the right. -Foot Xray showed Soft tissue swelling and diffuse osseous demineralization along with no acute bony abnormalities. There was Plantar and Achilles insertion calcaneal spur formation. Additional spur formation at dorsal margin of first metatarsal head at first MTP joint. Also showed Old erosion at IP joint  great toe with new erosions at the head of the first metatarsal, cannot exclude gout -Started Prednisone 0.5 mg/kg/day x 5 days (Day 4/5) along with Colchine 0.6 mg po Daily  -ESR was 67 and CRP was 18.8 -C/w Abx As above for Cellulitis and ? UTI/Prostate Infection  -May need further leg imaging and Characterization with a CT Scan or MRI if worsening however patient is currently improving  -Pain Control with Acetaminophen 650 mg po q6hprn, Tramadol 50 mg po q6hprn, and Morphine 1 mg IV q4hprn -Concerning as patient was bitten by a tick 3 weeks ago -Consider getting ECHO if not improving  -Will get PT/OT to Evaluate and treat  Acute on Chronic Kidney Disease Stage 3 -Prior Records show no normal Cr Level and ranging from 1.2-1.5 -BUN/Cr on Admission was 25/1.89 and went to 20/1.53 -> 22/1.54 -> 24/1.37 -IVF with NS at 50 mL/hr D/C'd -Avoid Nephrotoxic Medication and hold home Losartan -Continue to Monitor Renal Function and repeat CMP in AM  Essential HTN -Blood Pressure currently controlled and was 148/92 -Continue to Hold Home Losartan and likely restart in AM -Continue Home Cardizem of 300 mg po Daily  -IVF with NS discontinued  COPD -Currently Not in Exacerbation -Hold Home Albuterol Inhaler  -Place on DuoNebs 3 mL q6hprn for Wheezing of SOB  Hyperglycemia in the setting of Diet Controlled Type 2 Diabetes Mellitus -Place on Moderate Novolog SSI AC as BS on CMP was 118 and likely to go up with Steroid Use -Continue to Monitor CBG's; CBG's ranging from 91-127 -Checked HbA1c and was 5.2  Hematuria -Reported by the Patient -Likely related to Prostate Biopsy done 08/28/16 -Checked U/A and showed Large Hgb on dipstick with TNTC RBC -Urinalysis also showed TNTC WBC but no Bacteria, Negative Nitrites, and Negative Leukocytes -Urine Culture is pending -Urology is Aware and appreciated Recc's  HLD -Checked Lipid Panel and it showed Cholesterol of 123, HDL of 45, LDL of 65, TG  of 67, and VLDL 13 -Continue Diet Control  Elevated PSA/Low Risk Prostate Cancer -Per Urology Note PSA was 6.7 and DRE showed 35gm smooth -08/28/2016 TRUS 69m with hypoechoic R apex - biopsy + for higher volume low grade disease at the right apex  -Urology Following and will defer to discuss results with the Patient; Dr. MTresa Moorediscussed with the patient -Options were given including surveillance, radiatio, and surgery and Dr. MTresa Moorefeels as if Radiation is the most Reasonable option    Hypokalemia -Patient's K+ was 3.4 this AM -Replete with po KCl 40 mEQ BID x 2 doses -Continue to Monitor and Replete K+as Necessary -Repeat CMP in AM  Hypophosphatemia, improved -Patient's Phos Level was 1.9 and improved to 3.0 -Continue to Monitor and Replete Phos as Necessary -Repeat Phos Level in AM  Loose Stools -Likely from IV Abx -Patient is Afebrile and WBC is improving so do not clinically suspect C Difficile -If worsens will obtain C Difficile via PCR -D/C'd PRN Miralax  DVT prophylaxis: Heparin 5,000 units sq q8h Code Status: FULL CODE Family Communication: Wife was present at bedside Disposition  Plan: Remain Inpatient at this Time and likely D/C Home tomorrow; Get PT/OT Evaluation   Consultants:   Urology was consulted by EDP; Dr. Junious Silk evaluated the patient   Procedures  Right Lower Extremity Venous Duplex showed There is no evidence of deep or superficial vein thrombosis involving the right lower extremity. All visualized vessels appear patent and compressible. There is no evidence of a Baker's cyst on the right.   Antimicrobials: Anti-infectives    Start     Dose/Rate Route Frequency Ordered Stop   09/01/16 1800  vancomycin (VANCOCIN) 1,500 mg in sodium chloride 0.9 % 500 mL IVPB  Status:  Discontinued     1,500 mg 250 mL/hr over 120 Minutes Intravenous Every 24 hours 08/31/16 1620 08/31/16 1929   09/01/16 0000  piperacillin-tazobactam (ZOSYN) IVPB 3.375 g  Status:   Discontinued     3.375 g 12.5 mL/hr over 240 Minutes Intravenous Every 8 hours 08/31/16 1620 08/31/16 1929   08/31/16 2100  cefTRIAXone (ROCEPHIN) 2 g in dextrose 5 % 50 mL IVPB     2 g 100 mL/hr over 30 Minutes Intravenous Every 24 hours 08/31/16 1929     08/31/16 1630  vancomycin (VANCOCIN) IVPB 1000 mg/200 mL premix     1,000 mg 200 mL/hr over 60 Minutes Intravenous NOW 08/31/16 1620 08/31/16 1750   08/31/16 1515  piperacillin-tazobactam (ZOSYN) IVPB 3.375 g     3.375 g 100 mL/hr over 30 Minutes Intravenous  Once 08/31/16 1514 08/31/16 1537   08/31/16 1515  vancomycin (VANCOCIN) IVPB 1000 mg/200 mL premix     1,000 mg 200 mL/hr over 60 Minutes Intravenous  Once 08/31/16 1514 08/31/16 1627     Subjective: Seen and examined at bedside and feels like his leg is improving. Was able to bear weight on it with minimal pain. States it feels less swollen and feels as if it is improving. No nausea or vomiting. States he has some pain in his knees and anterior shins when he stands up. Has had some loose stools but feels as if it is from the Abx. No other concerns or complaints at this time.   Objective: Vitals:   09/02/16 0815 09/02/16 1419 09/02/16 2226 09/03/16 0615  BP: 134/69 137/84 (!) 155/98 (!) 148/92  Pulse: 77 72 75 71  Resp: 19  18 19   Temp: 98.8 F (37.1 C) 98.3 F (36.8 C) 98.4 F (36.9 C) 97.8 F (36.6 C)  TempSrc: Oral Oral Oral Oral  SpO2: 97% 96% 94% 95%  Weight: (!) 143.3 kg (315 lb 14.7 oz)   (!) 140.1 kg (308 lb 12.8 oz)  Height:        Intake/Output Summary (Last 24 hours) at 09/03/16 0749 Last data filed at 09/03/16 0736  Gross per 24 hour  Intake          2421.67 ml  Output             2570 ml  Net          -148.33 ml   Filed Weights   09/02/16 0500 09/02/16 0815 09/03/16 0615  Weight: (!) 143.3 kg (315 lb 14.7 oz) (!) 143.3 kg (315 lb 14.7 oz) (!) 140.1 kg (308 lb 12.8 oz)   Examination: Physical Exam:  Constitutional: Pleasant obese Caucasian male in  NAD. Appears calm and comfortable Eyes: Sclerae Anicteric, Conjunctivae Non-injected ENMT: Grossly normal Hearing. Mucous membranes appear moist.  Neck: Supple with no appreciable Respiratory: CTAB with no wheezing/rales/rhonchi. Patient was not tachypenic or using  any accessory muscles to breathe Cardiovascular: RRR S1, S2. Mild 1+ lower Extremity Right leg edema. Good Pedal Pulses Abdomen: Soft, NT, Distended due to body habitus. Bowel Sounds Positive GU: Deferred Musculoskeletal: No contractures or cyanosis. Skin: Right leg with no erythema and less swelling. Still was slightly warm to the touch. Left Leg with chronic venous stasis changes Neurologic: CN 2-12 grossly intact. No focal deficits.  Psychiatric: Pleasant mood and affect. Intact Judgement and insight.   Data Reviewed: I have personally reviewed following labs and imaging studies  CBC:  Recent Labs Lab 08/31/16 1503 09/01/16 1206 09/02/16 0528 09/03/16 0530  WBC 16.8* 15.2* 15.1* 13.7*  NEUTROABS 13.5* 14.0* 11.6* 10.5*  HGB 14.8 14.4 13.4 13.3  HCT 44.1 42.8 39.8 39.0  MCV 84.6 84.8 85.0 83.9  PLT 217 199 236 948   Basic Metabolic Panel:  Recent Labs Lab 08/31/16 1503 09/01/16 1206 09/02/16 0528 09/03/16 0530  NA 137 138 140 140  K 3.7 3.6 3.6 3.4*  CL 96* 101 100* 101  CO2 30 27 30 29   GLUCOSE 118* 171* 110* 103*  BUN 25* 20 22* 24*  CREATININE 1.89* 1.53* 1.54* 1.37*  CALCIUM 9.5 9.1 8.9 8.8*  MG  --  1.8 2.0 2.1  PHOS  --  1.9* 3.6 3.0   GFR: Estimated Creatinine Clearance: 72.7 mL/min (A) (by C-G formula based on SCr of 1.37 mg/dL (H)). Liver Function Tests:  Recent Labs Lab 08/31/16 1503 09/01/16 1206 09/02/16 0528 09/03/16 0530  AST 21 23 15 28   ALT 16* 18 15* 29  ALKPHOS 70 72 64 59  BILITOT 3.0* 1.2 0.5 0.6  PROT 7.9 7.3 6.8 6.6  ALBUMIN 4.0 3.4* 3.1* 3.0*   No results for input(s): LIPASE, AMYLASE in the last 168 hours. No results for input(s): AMMONIA in the last 168  hours. Coagulation Profile:  Recent Labs Lab 08/31/16 1503 09/01/16 0517  INR 1.21 1.23   Cardiac Enzymes: No results for input(s): CKTOTAL, CKMB, CKMBINDEX, TROPONINI in the last 168 hours. BNP (last 3 results) No results for input(s): PROBNP in the last 8760 hours. HbA1C:  Recent Labs  09/01/16 0517  HGBA1C 5.2   CBG:  Recent Labs Lab 09/01/16 2150 09/02/16 0813 09/02/16 1204 09/02/16 1822 09/02/16 2224  GLUCAP 142* 91 111* 127* 126*   Lipid Profile:  Recent Labs  09/02/16 0528  CHOL 123  HDL 45  LDLCALC 65  TRIG 67  CHOLHDL 2.7   Thyroid Function Tests:  Recent Labs  09/01/16 0517  TSH 0.736   Anemia Panel: No results for input(s): VITAMINB12, FOLATE, FERRITIN, TIBC, IRON, RETICCTPCT in the last 72 hours. Sepsis Labs:  Recent Labs Lab 08/31/16 1511 08/31/16 1950  PROCALCITON  --  0.12  LATICACIDVEN 1.81  --     Recent Results (from the past 240 hour(s))  Culture, blood (Routine x 2)     Status: None (Preliminary result)   Collection Time: 08/31/16  3:03 PM  Result Value Ref Range Status   Specimen Description BLOOD LEFT ANTECUBITAL  Final   Special Requests   Final    BOTTLES DRAWN AEROBIC AND ANAEROBIC Blood Culture results may not be optimal due to an excessive volume of blood received in culture bottles   Culture   Final    NO GROWTH 2 DAYS Performed at [facility] [address] [place], [place] [address]    Report Status PENDING  Incomplete  Culture, blood (Routine x 2)     Status:  None (Preliminary result)   Collection Time: 08/31/16  3:03 PM  Result Value Ref Range Status   Specimen Description BLOOD BLOOD RIGHT FOREARM  Final   Special Requests   Final    BOTTLES DRAWN AEROBIC AND ANAEROBIC Blood Culture adequate volume   Culture   Final    NO GROWTH 2 DAYS Performed at [facility], [address], [place], [place] [address]    Report Status PENDING  Incomplete  Urine culture     Status: None   Collection  Time: 08/31/16  3:15 PM  Result Value Ref Range Status   Specimen Description URINE, CLEAN CATCH  Final   Special Requests NONE  Final   Culture   Final    NO GROWTH Performed at [facility], [place] [address], [place], [place] [address]    Report Status 09/02/2016 FINAL  Final    Radiology Studies: No results found. Scheduled Meds: . aspirin EC  81 mg Oral Daily  . colchicine  0.6 mg Oral Daily  . diltiazem  300 mg Oral Daily  . heparin  5,000 Units Subcutaneous Q8H  . insulin aspart  0-15 Units Subcutaneous TID WC  . predniSONE  70 mg Oral Q breakfast  . sodium chloride flush  3 mL Intravenous Q12H   Continuous Infusions: . sodium chloride 50 mL/hr (09/02/16 2334)  . cefTRIAXone (ROCEPHIN)  IV 2 g (09/02/16 2325)    LOS: 3 days   Kerney Elbe, DO [facility] Pager [phone]  If 7PM-7AM, please contact night-coverage [email or web address] Password [facility] 09/03/2016, 7:49 AM

## 2016-09-04 ENCOUNTER — Other Ambulatory Visit: Payer: Medicare Other | Admitting: Vascular Surgery

## 2016-09-04 LAB — COMPREHENSIVE METABOLIC PANEL
ALT: 37 U/L (ref 17–63)
AST: 28 U/L (ref 15–41)
Albumin: 3.2 g/dL — ABNORMAL LOW (ref 3.5–5.0)
Alkaline Phosphatase: 64 U/L (ref 38–126)
Anion gap: 10 (ref 5–15)
BUN: 26 mg/dL — ABNORMAL HIGH (ref 6–20)
CHLORIDE: 101 mmol/L (ref 101–111)
CO2: 30 mmol/L (ref 22–32)
CREATININE: 1.43 mg/dL — AB (ref 0.61–1.24)
Calcium: 9.1 mg/dL (ref 8.9–10.3)
GFR calc non Af Amer: 48 mL/min — ABNORMAL LOW (ref >60)
GFR, EST AFRICAN AMERICAN: 55 mL/min — AB (ref >60)
Glucose, Bld: 94 mg/dL (ref 65–99)
POTASSIUM: 3.9 mmol/L (ref 3.5–5.1)
SODIUM: 141 mmol/L (ref 135–145)
Total Bilirubin: 0.4 mg/dL (ref 0.3–1.2)
Total Protein: 6.9 g/dL (ref 6.5–8.1)

## 2016-09-04 LAB — CBC WITH DIFFERENTIAL/PLATELET
BASOS ABS: 0 10*3/uL (ref 0.0–0.1)
Basophils Relative: 0 %
EOS ABS: 0.1 10*3/uL (ref 0.0–0.7)
Eosinophils Relative: 0 %
HCT: 41.7 % (ref 39.0–52.0)
Hemoglobin: 14 g/dL (ref 13.0–17.0)
Lymphocytes Relative: 18 %
Lymphs Abs: 2.3 10*3/uL (ref 0.7–4.0)
MCH: 27.8 pg (ref 26.0–34.0)
MCHC: 33.6 g/dL (ref 30.0–36.0)
MCV: 82.7 fL (ref 78.0–100.0)
Monocytes Absolute: 1.2 10*3/uL — ABNORMAL HIGH (ref 0.1–1.0)
Monocytes Relative: 9 %
Neutro Abs: 9.3 10*3/uL — ABNORMAL HIGH (ref 1.7–7.7)
Neutrophils Relative %: 73 %
PLATELETS: 327 10*3/uL (ref 150–400)
RBC: 5.04 MIL/uL (ref 4.22–5.81)
RDW: 13 % (ref 11.5–15.5)
WBC: 12.7 10*3/uL — AB (ref 4.0–10.5)

## 2016-09-04 LAB — GLUCOSE, CAPILLARY
GLUCOSE-CAPILLARY: 91 mg/dL (ref 65–99)
Glucose-Capillary: 93 mg/dL (ref 65–99)

## 2016-09-04 LAB — PHOSPHORUS: Phosphorus: 3 mg/dL (ref 2.5–4.6)

## 2016-09-04 LAB — MAGNESIUM: MAGNESIUM: 2 mg/dL (ref 1.7–2.4)

## 2016-09-04 MED ORDER — COLCHICINE 0.6 MG PO TABS
0.6000 mg | ORAL_TABLET | Freq: Every day | ORAL | 0 refills | Status: DC
Start: 2016-09-04 — End: 2016-10-16

## 2016-09-04 MED ORDER — TRAMADOL HCL 50 MG PO TABS: 50.0000 mg | ORAL_TABLET | Freq: Four times a day (QID) | ORAL | 0 refills | Status: DC | PRN

## 2016-09-04 MED ORDER — VALSARTAN 160 MG PO TABS: 160.0000 mg | ORAL_TABLET | Freq: Every day | ORAL | 0 refills | Status: DC

## 2016-09-04 MED ORDER — CEPHALEXIN 500 MG PO CAPS: 500.0000 mg | ORAL_CAPSULE | Freq: Four times a day (QID) | ORAL | 0 refills | Status: DC

## 2016-09-04 MED ORDER — CEPHALEXIN 500 MG PO CAPS
500.0000 mg | ORAL_CAPSULE | Freq: Four times a day (QID) | ORAL | Status: DC
  Administered 2016-09-04: 500 mg via ORAL
  Filled 2016-09-04: qty 1

## 2016-09-04 NOTE — Progress Notes (Signed)
Pt and wife selected [facility] for [facility] needs, referral given to in house rep.

## 2016-09-04 NOTE — Care Management Important Message (Signed)
Important Message  Patient Details  Name: Travis Daniels MRN: [id number] Date of Birth: 12/24/1945   Medicare Important Message Given:  Yes    Kerin Salen 09/04/2016, 11:16 AMImportant Message  Patient Details  Name: Travis Daniels MRN: [id number] Date of Birth: 03/11/1946   Medicare Important Message Given:  Yes    Kerin Salen 09/04/2016, 11:16 AM

## 2016-09-04 NOTE — Discharge Summary (Signed)
Physician Discharge Summary  Travis Daniels BWL:[id number] DOB: [birth year] DOA: 08/31/2016  PCP: Ann Held, DO  Admit date: 08/31/2016 Discharge date: 09/04/2016  Admitted From: Home Disposition:  Home with Home Health  Recommendations for Outpatient Follow-up:  1. Follow up with PCP in 1-2 weeks; Appointment is on 09/08/16 at 11:30 AM 2. Follow up with Urology as an outpatient  3. Please obtain CMP/CBC, Mag, Phos in one week  Home Health: YES Equipment/Devices: Conservation officer, nature with 5" Wheels    Discharge Condition: Stable  CODE STATUS: FULL CODE Diet recommendation: Heart Healthy Diet  Brief/Interim Summary: Travis Rivest Parsellsis a 71 y.o.malewith medical history significant of COPD, HTN, HLD, Suspected CKD stage 3, Diet Controlled Diabetes, Obesity and other comorbidities who presented with 3 day worsening Right Leg swelling associated with some erythema and warmth. Patient states that he recently was bit by a tick on his inner thigh 3 weeks ago and went to PCP and was treated with Doxycycline. He went to the Urologist Monday for a Prostate Biopsy and states the next day on Tuesday he noticed that his Right leg started swelling significantly and had some erythema. Patient states he thought he sprained his ankle as it was a little hard to walk. Patient states that it subsequently got worse the next day and last night the patient's wife stated she felt significant warmth. He was unable to ambulate properly and stated that his foot and leg hurt so bad that he had to shuffle and walk with crutches. Because he has a hx of cellulitis patient felt as this was similar so he presented to [facility] for evaluation. Patient states that he has some urinary discomfort after his prostate biopsy and that his urine has been dark like "tea" and had blood in it. He denies any other symptoms besides his leg. Patient was found to be febrile in the ED He was worked up and [facility] was called to admit for Sepsis  and Right Leg Cellulitis. Patient was placed on Abx as well as po Prednisone and Colcichine for suspected Gout Flare. He feels leg is improving and is less erythematous, swollen and less painful. Patient able to bear weight on it but just feels weak so PT was consulted who recommended Home Health PT. Patient's leg improved significantly and he was deemed medically stable to be D/C'd and follow up with PCP and with Urology as an outpatient.    Discharge Diagnoses:  Principal Problem:   Sepsis ([place]) Active Problems:   Essential hypertension, benign   Venous stasis dermatitis   COPD (chronic obstructive pulmonary disease) (HCC)   Diabetes mellitus (HCC)   Cellulitis of leg, right   Cellulitis   Gout attack   Acute renal failure superimposed on chronic kidney disease (HCC)   Hematuria   Hyperlipidemia   Elevated PSA   Hypophosphatemia   Hypokalemia   Loose stools  Sepsis suspected to ? Lower Extremity Cellulitis in the setting of Venous Stasis vs Other Etiology -Admitted to Inpatient Telemetry -Sepsis Protocol Utilized in ED -Patient was Febrile, Tachycardic, Tachypenic, had a Leukocytosis and had suspected source of Skin infection -Sepsis Physiology is improved; Leg not as warm or erythematous today and swelling has decreased significantly  -Has a Hx of Skin infections and states that his leg was very warm last night and that his Cellulitis starts like this -Was placed on Broad Spectrum Abx with Vanc/Zosyn but de-escalated to IV Ceftriaxone as patient's Kidney function was elevated; IV Ceftriaxone changed to po  Keflext for 5 more days -Follow Blood Cx's and showed NGTD at 4 Days -Unfortunately patient received IV Abx prior to getting a U/A and Urine Cx but still need to get U/A and Urine Cx -WBC on Admission was 16.8; Improved to 15.2 -> 15.1 -> 13.7 -> 12.7  -Checked Procalcitonin and was 0.12; Lactic Acid Level was 1.81 -Repeat CBC at PCP's off -D/C  IVF with NS at 50 mL/hr.  -Foot  Xray showed Soft tissue swelling and diffuse osseous demineralization -PT/OT Evaluated for Treatment and Evaluation and recommended Home Health PT  Right Lower Leg Swelling from Cellulitis in the setting of an Acute Gout Flare; DVT r/o'd; Less likely UTI/Prostate infection  -Checked Venous Duplex to r/o DVT and showed no evidence of deep or superficial vein thrombosis involving the right lower extremity. All visualized vessels appear patent and compressible. There is no evidence of a Baker's cyst on the right. -Foot Xray showed Soft tissue swelling and diffuse osseous demineralization along with no acute bony abnormalities. There was Plantar and Achilles insertion calcaneal spur formation. Additional spur formation at dorsal margin of first metatarsal head at first MTP joint. Also showed Old erosion at IP joint great toe with new erosions at the head of the first metatarsal, cannot exclude gout -Started Prednisone 0.5 mg/kg/day x 5 days (Day 5/5) along with Colchine 0.6 mg po Daily and will continue Colchicine until evaluated by PCP  -ESR was 67 and CRP was 18.8 -C/w Abx As above for Cellulitis and ? UTI/Prostate Infection however UTI/Prostate Infection is extremely less likely.  -May need further leg imaging and Characterization with a CT Scan or MRI if worsening however patient is currently improving and can follow up with PCP as an outpatient  -Pain Control with Acetaminophen 650 mg po q6hprn, Tramadol 50 mg po q6hprn, and Morphine 1 mg IV q4hprn while hospitalized  -Concerning as patient was bitten by a tick 3 weeks ago -Consider getting ECHO if not improving  -PT/OT to Evaluate and Recommended Home Health PT   Acute on Chronic Kidney Disease Stage 3 -Prior Records show no normal Cr Level and ranging from 1.2-1.5 -BUN/Cr on Admission was 25/1.89 and went to 20/1.53 -> 22/1.54 -> 24/1.37 -> 26/1.43 -IVF with NS at 50 mL/hr D/C'd -Avoid Nephrotoxic Medication and restarted home Losartan but at  half the dose -Continue to Monitor Renal Function and repeat CMP as an outpatient -Discuss with PCP about going back to regular dose of Losartan   Essential HTN -Blood Pressure currently controlled and was 148/92 -Continue to Hold Home Losartan and likely restart in AM -Continue Home Cardizem of 300 mg po Daily  -IVF with NS discontinued  COPD -Currently Not in Exacerbation -Hold Home Albuterol Inhaler  -Place on DuoNebs 3 mL q6hprn for Wheezing of SOB  Hyperglycemia in the setting of Diet Controlled Type 2 Diabetes Mellitus -Placed on Moderate Novolog SSI AC as BS on CMP was 118 and likely to go up with Steroid Use -Continue to Monitor CBG's; CBG's ranging from 91-120 -Checked HbA1c and was 5.2 -Follow up with PCP at D/C  Hematuria, improved -Reported by the Patient -Likely related to Prostate Biopsy done 08/28/16 -Checked U/A and showed Large Hgb on dipstick with TNTC RBC -Urinalysis also showed TNTC WBC but no Bacteria, Negative Nitrites, and Negative Leukocytes -Urine Culture is pending -Urology is Aware and appreciated Recc's -Follow up with Urology as an outpatient   HLD -Checked Lipid Panel and it showed Cholesterol of 123, HDL of 45,  LDL of 65, TG of 67, and VLDL 13 -Continue Diet Control  Elevated PSA/Low Risk Prostate Cancer -Per Urology Note PSA was 6.7 and DRE showed 35gm smooth -08/28/2016 TRUS 90m with hypoechoic R apex - biopsy + for higher volume low grade disease at the right apex  -Urology Following and will defer to discuss results with the Patient; Dr. MTresa Moorediscussed with the patient -Options were given including surveillance, radiatio, and surgery and Dr. MTresa Moorefeels as if Radiation is the most Reasonable option   -Follow up with Urology Dr. MTresa Mooreas an outpatient   Hypokalemia -Patient's K+ was 3.9 this AM from 3.4 -Continue to Monitor and Replete K+as Necessary -Repeat CMP as an outpatient   Hypophosphatemia, improved -Patient's Phos Level  was 1.9 and improved to 3.0 -Continue to Monitor and Replete Phos as Necessary -Repeat Phos Level as an outpatient   Loose Stools, improved -Likely from IV Abx -Patient is Afebrile and WBC is improving so do not clinically suspect C Difficile -If worsens will obtain C Difficile via PCR -D/C'd PRN Miralax  Discharge Instructions  Discharge Instructions    Call MD for:  difficulty breathing, headache or visual disturbances    Complete by:  As directed    Call MD for:  extreme fatigue    Complete by:  As directed    Call MD for:  hives    Complete by:  As directed    Call MD for:  persistant dizziness or light-headedness    Complete by:  As directed    Call MD for:  persistant nausea and vomiting    Complete by:  As directed    Call MD for:  redness, tenderness, or signs of infection (pain, swelling, redness, odor or green/yellow discharge around incision site)    Complete by:  As directed    Call MD for:  severe uncontrolled pain    Complete by:  As directed    Call MD for:  temperature >100.4    Complete by:  As directed    Diet - low sodium heart healthy    Complete by:  As directed    Discharge instructions    Complete by:  As directed    Follow up with PCP and Urology at Discharge. Take all medications as prescribed. If symptoms change or worsen please return to the ED for evaluation.   Increase activity slowly    Complete by:  As directed      Allergies as of 09/04/2016   No Known Allergies     Medication List    STOP taking these medications   betamethasone dipropionate 0.05 % cream Commonly known as:  DIPROLENE   nystatin powder Generic drug:  nystatin     TAKE these medications   albuterol 108 (90 Base) MCG/ACT inhaler Commonly known as:  PROVENTIL HFA;VENTOLIN HFA Inhale 2 puffs into the lungs every 6 (six) hours as needed for wheezing or shortness of breath.   aspirin 81 MG tablet Take 81 mg by mouth daily.   cephALEXin 500 MG capsule Commonly  known as:  KEFLEX Take 1 capsule (500 mg total) by mouth every 6 (six) hours.   colchicine 0.6 MG tablet Take 1 tablet (0.6 mg total) by mouth daily.   diltiazem 300 MG 24 hr capsule Commonly known as:  CARDIZEM CD Take 1 capsule (300 mg total) by mouth daily.   GARLIQUE PO Take 1 tablet by mouth daily.   glucose blood test strip Commonly known as:  ONE  TOUCH ULTRA TEST CHECK BLOOD SUGAR TWICE DAILY   ONETOUCH DELICA LANCETS FINE Misc Check blood sugar daily   traMADol 50 MG tablet Commonly known as:  ULTRAM Take 1 tablet (50 mg total) by mouth every 6 (six) hours as needed for moderate pain.   valsartan 160 MG tablet Commonly known as:  DIOVAN Take 1 tablet (160 mg total) by mouth daily. What changed:  when to take this      Follow-up Information    Alexis Frock, MD On 10/03/2016.   Specialty:  Urology Why:  as scheduled Contact information: [place] [address] [phone]          No Known Allergies  Consultations:  None  Procedures/Studies: Dg Foot Complete Right  Result Date: 08/31/2016 CLINICAL DATA:  RIGHT foot and RIGHT lower extremity redness, swelling and increased warmth, beginning Tuesday evening, diabetes mellitus, no known injury EXAM: RIGHT FOOT COMPLETE - 3+ VIEW COMPARISON:  03/25/2014 FINDINGS: Diffuse soft tissue swelling. Osseous demineralization. Joint spaces preserved. Plantar and Achilles insertion calcaneal spur formation. Additional spur formation at dorsal margin of first metatarsal head at first MTP joint. No acute fracture, dislocation, or acute bone destruction. Old erosion at medial margin of the head of the proximal phalanx great toe with new erosions at the head of the first metatarsal medially. IMPRESSION: Soft tissue swelling and diffuse osseous demineralization. No acute bony abnormalities. Old erosion at IP joint great toe with new erosions at the head of the first metatarsal, cannot exclude gout.  Electronically Signed   By: Lavonia Dana M.D.   On: 08/31/2016 16:55     Subjective: Seen and examined and was doing well. States Leg is significantly improved and is able to bear weight and walk on it just fine. Ready to go home.   Discharge Exam: Vitals:   09/03/16 2300 09/04/16 0512  BP:  (!) 145/85  Pulse:  (!) 54  Resp: 18 18  Temp:  98.1 F (36.7 C)   Vitals:   09/03/16 1538 09/03/16 2235 09/03/16 2300 09/04/16 0512  BP:  (!) 157/95  (!) 145/85  Pulse: 76 61  (!) 54  Resp: 18 18 18 18   Temp: 98.1 F (36.7 C) 98.3 F (36.8 C)  98.1 F (36.7 C)  TempSrc: Oral Oral  Oral  SpO2: 95% 97%  98%  Weight:    (!) 140.1 kg (308 lb 12.8 oz)  Height:       General: Pt is alert, awake, not in acute distress Cardiovascular: RRR, S1/S2 +, no rubs, no gallops Respiratory: CTA bilaterally, no wheezing, no rhonchi Abdominal: Soft, NT, ND, bowel sounds + Extremities: Mild LE Right Leg edema with some venous stasis changes; Left Leg venous stasis changes, no cyanosis  The results of significant diagnostics from this hospitalization (including imaging, microbiology, ancillary and laboratory) are listed below for reference.    Microbiology: Recent Results (from the past 240 hour(s))  Culture, blood (Routine x 2)     Status: None (Preliminary result)   Collection Time: 08/31/16  3:03 PM  Result Value Ref Range Status   Specimen Description BLOOD LEFT ANTECUBITAL  Final   Special Requests   Final    BOTTLES DRAWN AEROBIC AND ANAEROBIC Blood Culture results may not be optimal due to an excessive volume of blood received in culture bottles   Culture   Final    NO GROWTH 4 DAYS Performed at [facility] [address] [place], [place] [address]  Report Status PENDING  Incomplete  Culture, blood (Routine x 2)     Status: None (Preliminary result)   Collection Time: 08/31/16  3:03 PM  Result Value Ref Range Status   Specimen Description BLOOD BLOOD RIGHT FOREARM  Final    Special Requests   Final    BOTTLES DRAWN AEROBIC AND ANAEROBIC Blood Culture adequate volume   Culture   Final    NO GROWTH 4 DAYS Performed at [facility], [address], [place], [place] [address]    Report Status PENDING  Incomplete  Urine culture     Status: None   Collection Time: 08/31/16  3:15 PM  Result Value Ref Range Status   Specimen Description URINE, CLEAN CATCH  Final   Special Requests NONE  Final   Culture   Final    NO GROWTH Performed at [facility], [place] [address] [place], [place] [address]    Report Status 09/02/2016 FINAL  Final    Labs: BNP (last 3 results) No results for input(s): BNP in the last 8760 hours. Basic Metabolic Panel:  Recent Labs Lab 08/31/16 1503 09/01/16 1206 09/02/16 0528 09/03/16 0530 09/04/16 0554  NA 137 138 140 140 141  K 3.7 3.6 3.6 3.4* 3.9  CL 96* 101 100* 101 101  CO2 30 27 30 29 30   GLUCOSE 118* 171* 110* 103* 94  BUN 25* 20 22* 24* 26*  CREATININE 1.89* 1.53* 1.54* 1.37* 1.43*  CALCIUM 9.5 9.1 8.9 8.8* 9.1  MG  --  1.8 2.0 2.1 2.0  PHOS  --  1.9* 3.6 3.0 3.0   Liver Function Tests:  Recent Labs Lab 08/31/16 1503 09/01/16 1206 09/02/16 0528 09/03/16 0530 09/04/16 0554  AST 21 23 15 28 28   ALT 16* 18 15* 29 37  ALKPHOS 70 72 64 59 64  BILITOT 3.0* 1.2 0.5 0.6 0.4  PROT 7.9 7.3 6.8 6.6 6.9  ALBUMIN 4.0 3.4* 3.1* 3.0* 3.2*   No results for input(s): LIPASE, AMYLASE in the last 168 hours. No results for input(s): AMMONIA in the last 168 hours. CBC:  Recent Labs Lab 08/31/16 1503 09/01/16 1206 09/02/16 0528 09/03/16 0530 09/04/16 0554  WBC 16.8* 15.2* 15.1* 13.7* 12.7*  NEUTROABS 13.5* 14.0* 11.6* 10.5* 9.3*  HGB 14.8 14.4 13.4 13.3 14.0  HCT 44.1 42.8 39.8 39.0 41.7  MCV 84.6 84.8 85.0 83.9 82.7  PLT 217 199 236 242 327   Cardiac Enzymes: No results for input(s): CKTOTAL, CKMB, CKMBINDEX, TROPONINI in the last 168 hours. BNP: Invalid input(s): POCBNP CBG:  Recent Labs Lab  09/03/16 1205 09/03/16 1718 09/03/16 2233 09/04/16 0735 09/04/16 1220  GLUCAP 94 132* 120* 93 91   D-Dimer No results for input(s): DDIMER in the last 72 hours. Hgb A1c No results for input(s): HGBA1C in the last 72 hours. Lipid Profile  Recent Labs  09/02/16 0528  CHOL 123  HDL 45  LDLCALC 65  TRIG 67  CHOLHDL 2.7   Thyroid function studies No results for input(s): TSH, T4TOTAL, T3FREE, THYROIDAB in the last 72 hours.  Invalid input(s): FREET3 Anemia work up No results for input(s): VITAMINB12, FOLATE, FERRITIN, TIBC, IRON, RETICCTPCT in the last 72 hours. Urinalysis    Component Value Date/Time   COLORURINE AMBER (A) 08/31/2016 1503   APPEARANCEUR CLEAR 08/31/2016 1503   LABSPEC 1.016 08/31/2016 1503   PHURINE 5.0 08/31/2016 1503   GLUCOSEU NEGATIVE 08/31/2016 1503   HGBUR LARGE (A) 08/31/2016 1503   BILIRUBINUR NEGATIVE  08/31/2016 1503   BILIRUBINUR neg 08/27/2015 1458   KETONESUR NEGATIVE 08/31/2016 1503   PROTEINUR 30 (A) 08/31/2016 1503   UROBILINOGEN 0.2 08/27/2015 1458   UROBILINOGEN 0.2 01/14/2013 1032   NITRITE NEGATIVE 08/31/2016 1503   LEUKOCYTESUR NEGATIVE 08/31/2016 1503   Sepsis Labs Invalid input(s): PROCALCITONIN,  WBC,  LACTICIDVEN Microbiology Recent Results (from the past 240 hour(s))  Culture, blood (Routine x 2)     Status: None (Preliminary result)   Collection Time: 08/31/16  3:03 PM  Result Value Ref Range Status   Specimen Description BLOOD LEFT ANTECUBITAL  Final   Special Requests   Final    BOTTLES DRAWN AEROBIC AND ANAEROBIC Blood Culture results may not be optimal due to an excessive volume of blood received in culture bottles   Culture   Final    NO GROWTH 4 DAYS Performed at [facility], [place] [address], [place], [place] [address]    Report Status PENDING  Incomplete  Culture, blood (Routine x 2)     Status: None (Preliminary result)   Collection Time: 08/31/16  3:03 PM  Result Value Ref Range Status   Specimen  Description BLOOD BLOOD RIGHT FOREARM  Final   Special Requests   Final    BOTTLES DRAWN AEROBIC AND ANAEROBIC Blood Culture adequate volume   Culture   Final    NO GROWTH 4 DAYS Performed at [facility], [place] [address], [place], [place] [address]    Report Status PENDING  Incomplete  Urine culture     Status: None   Collection Time: 08/31/16  3:15 PM  Result Value Ref Range Status   Specimen Description URINE, CLEAN CATCH  Final   Special Requests NONE  Final   Culture   Final    NO GROWTH Performed at [facility], [place] [address], [place], [place] [address]    Report Status 09/02/2016 FINAL  Final   Time coordinating discharge: 35 minutes  SIGNED:  Kerney Elbe, DO [facility] 09/04/2016, 9:12 PM Pager [phone]  If 7PM-7AM, please contact night-coverage [email or web address] Password [email or web address]

## 2016-09-05 ENCOUNTER — Telehealth: Payer: Self-pay

## 2016-09-05 LAB — CULTURE, BLOOD (ROUTINE X 2)
CULTURE: NO GROWTH
Culture: NO GROWTH
SPECIAL REQUESTS: ADEQUATE

## 2016-09-05 NOTE — Telephone Encounter (Signed)
09/05/16   Transition Care Management Follow-up Telephone Call  ADMISSION DATE: 08/31/16  DISCHARGE DATE: 09/04/16   How have you been since you were released from the hospital? Doing fine per patient  Do you understand why you were in the hospital? Yes per patient   Do you understand the discharge instrcutions? Yes per patient.    Items Reviewed:  Medications reviewed: Reviewed with patient   Allergies reviewed: NKDA   Dietary changes reviewed:Low sodium heart healthy   Referrals reviewed: Appointment scheduled   Functional Questionnaire:   Activities of Daily Living (ADLs): Patient ok to feed seld, dress self and bath himsel. Needs assistance with ambulation.  Any patient concerns? Patient has concerns about his diet.   Confirmed importance and date/time of follow-up visits scheduled: Yes Confirmed with patient if condition begins to worsen call PCP or go to the ER. Yes   Patient was given the office number and encouragred to call back with questions or concerns.Yes

## 2016-09-08 ENCOUNTER — Ambulatory Visit (INDEPENDENT_AMBULATORY_CARE_PROVIDER_SITE_OTHER): Payer: Medicare Other | Admitting: Family Medicine

## 2016-09-08 ENCOUNTER — Encounter: Payer: Self-pay | Admitting: Family Medicine

## 2016-09-08 ENCOUNTER — Other Ambulatory Visit: Payer: Self-pay | Admitting: *Deleted

## 2016-09-08 ENCOUNTER — Telehealth: Payer: Self-pay | Admitting: Family Medicine

## 2016-09-08 VITALS — BP 130/80 | HR 70 | Temp 98.5°F | Resp 16 | Ht 73.0 in | Wt 308.8 lb

## 2016-09-08 DIAGNOSIS — Z8619 Personal history of other infectious and parasitic diseases: Secondary | ICD-10-CM

## 2016-09-08 DIAGNOSIS — I83892 Varicose veins of left lower extremities with other complications: Secondary | ICD-10-CM

## 2016-09-08 DIAGNOSIS — N189 Chronic kidney disease, unspecified: Secondary | ICD-10-CM | POA: Diagnosis not present

## 2016-09-08 DIAGNOSIS — M109 Gout, unspecified: Secondary | ICD-10-CM | POA: Diagnosis not present

## 2016-09-08 DIAGNOSIS — N179 Acute kidney failure, unspecified: Secondary | ICD-10-CM

## 2016-09-08 DIAGNOSIS — M8580 Other specified disorders of bone density and structure, unspecified site: Secondary | ICD-10-CM

## 2016-09-08 DIAGNOSIS — L03115 Cellulitis of right lower limb: Secondary | ICD-10-CM

## 2016-09-08 DIAGNOSIS — C61 Malignant neoplasm of prostate: Secondary | ICD-10-CM | POA: Diagnosis not present

## 2016-09-08 DIAGNOSIS — M898X9 Other specified disorders of bone, unspecified site: Secondary | ICD-10-CM

## 2016-09-08 DIAGNOSIS — R29890 Loss of height: Secondary | ICD-10-CM

## 2016-09-08 NOTE — Patient Instructions (Signed)

## 2016-09-08 NOTE — Telephone Encounter (Signed)
Caller name:Amy w/ Advance Relationship to patient: Can be reached:[phone] Pharmacy:  Reason for call:Patient refused home health

## 2016-09-08 NOTE — Progress Notes (Signed)
Patient ID: MENELIK MCFARREN, male   DOB: 1945-11-25, 71 y.o.   MRN: [id number]     Subjective:  I acted as a Education administrator for Dr. Carollee Herter.  [name], [place]   Patient ID: ELAINE MIDDLETON, male    DOB: 09/24/1945, 71 y.o.   MRN: [id number]  Chief Complaint  Patient presents with  . Hospitalization Follow-up    08/31/16-09/04/16    HPI  Patient is in today for follow up hospital visit.  He was in hospital from 08/31/16 til 09/04/16 for sepsis.  He is doing much better and walking on his own without no help.  Patient Care Team: Carollee Herter, [name], DO as PCP - General (Family Medicine) Donato Heinz, MD as Consulting Physician (Nephrology)   Past Medical History:  Diagnosis Date  . Arthritis   . B12 deficiency   . COPD (chronic obstructive pulmonary disease) ([place]) 01/2011   FVC 47%, FEV1 49%   . 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 Hyperlipidemia   . Hypertension   . Lower extremity edema   . Secondary pulmonary hypertension 01/2011   RV syst pressure 30-40 mm Hg on ECHO     Past Surgical History:  Procedure Laterality Date  . 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 Ronelle Nigh at [facility] in [place], [place])  . FINGER SURGERY Right 11   rt middle finger  . TOTAL SHOULDER ARTHROPLASTY Right 01/21/2013   Procedure: SHOULDER HEMI ARTHROPLASTY;  Surgeon: Garald Balding, MD;  Location: [facility];  Service: Orthopedics;  Laterality: Right;  . TRANSTHORACIC ECHOCARDIOGRAM  01/2011   EF 55-60%, mild asymmetric LVH, elevated RV systolic pressure  . VASECTOMY      Family History  Problem Relation Age of Onset  . CAD Mother        MI in her 77s  . Cancer Father        colon and pancreas  . Epilepsy Son     Social History   Social History  . Marital status: Married    Spouse name: N/A  . Number of children: 4  . Years of education: N/A   Occupational History    . retired    Social History Main Topics  . Smoking status: Never Smoker  . Smokeless tobacco: Never Used     Comment: occ alcohol  . Alcohol use 0.0 oz/week     Comment: 2-3 drinks occasionally  . Drug use: No  . Sexual activity: Yes   Other Topics Concern  . Not on file   Social History Narrative   Mr. Bogdon lives with his wife in [place]. He is recently retired and relocated to [place] [place] from [place]. Historically, his medical provider has been [facility] in [place].    Outpatient Medications Prior to Visit  Medication Sig Dispense Refill  . albuterol (PROVENTIL HFA;VENTOLIN HFA) 108 (90 BASE) MCG/ACT inhaler Inhale 2 puffs into the lungs every 6 (six) hours as needed for wheezing or shortness of breath.    . cephALEXin (KEFLEX) 500 MG capsule Take 1 capsule (500 mg total) by mouth every 6 (six) hours. 20 capsule 0  . colchicine 0.6 MG tablet Take 1 tablet (0.6 mg total) by mouth daily. 30 tablet 0  . diltiazem (CARDIZEM CD) 300 MG 24 hr capsule Take 1 capsule (300 mg total) by mouth daily. 90 capsule 3  . glucose blood (ONE TOUCH ULTRA  TEST) test strip CHECK BLOOD SUGAR TWICE DAILY 100 each 5  . ONETOUCH DELICA LANCETS FINE MISC Check blood sugar daily 100 each 12  . valsartan (DIOVAN) 160 MG tablet Take 1 tablet (160 mg total) by mouth daily. 180 tablet 0  . aspirin 81 MG tablet Take 81 mg by mouth daily.    . Garlic (GARLIQUE PO) Take 1 tablet by mouth daily.    . traMADol (ULTRAM) 50 MG tablet Take 1 tablet (50 mg total) by mouth every 6 (six) hours as needed for moderate pain. 15 tablet 0   No facility-administered medications prior to visit.     No Known Allergies  Review of Systems  Constitutional: Negative for fever and malaise/fatigue.  HENT: Negative for congestion.   Eyes: Negative for blurred vision.  Respiratory: Negative for cough and shortness of breath.   Cardiovascular: Negative for chest pain, palpitations and leg swelling.  Gastrointestinal: Negative for vomiting.   Musculoskeletal: Negative for back pain.  Skin: Negative for rash.  Neurological: Negative for loss of consciousness and headaches.       Objective:    Physical Exam  Constitutional: He is oriented to person, place, and time. Vital signs are normal. He appears well-developed and well-nourished. He is sleeping.  HENT:  Head: Normocephalic and atraumatic.  Mouth/Throat: Oropharynx is clear and moist.  Eyes: EOM are normal. Pupils are equal, round, and reactive to light.  Neck: Normal range of motion. Neck supple. No thyromegaly present.  Cardiovascular: Normal rate and regular rhythm.   No murmur heard. Pulmonary/Chest: Effort normal and breath sounds normal. No respiratory distress. He has no wheezes. He has no rales. He exhibits no tenderness.  Musculoskeletal: He exhibits no edema or tenderness.  Neurological: He is alert and oriented to person, place, and time.  Skin: Skin is warm and dry. No rash noted. No erythema.  Psychiatric: He has a normal mood and affect. His behavior is normal. Judgment and thought content normal.  Nursing note and vitals reviewed.   BP 130/80 (BP Location: Left Arm, Cuff Size: Large)   Pulse 70   Temp 98.5 F (36.9 C) (Oral)   Resp 16   Ht 6\' 1"  (1.854 m)   Wt (!) 308 lb 12.8 oz (140.1 kg)   SpO2 94%   BMI 40.74 kg/m  Wt Readings from Last 3 Encounters:  09/08/16 (!) 308 lb 12.8 oz (140.1 kg)  09/04/16 (!) 308 lb 12.8 oz (140.1 kg)  08/15/16 (!) 312 lb 6.4 oz (141.7 kg)   BP Readings from Last 3 Encounters:  09/08/16 130/80  09/04/16 (!) 145/85  08/31/16 126/76     Immunization History  Administered Date(s) Administered  . Influenza Split 11/11/2012, 12/11/2012  . Influenza, High Dose Seasonal PF 12/03/2014  . Influenza,inj,Quad PF,36+ Mos 11/21/2013  . Influenza-Unspecified 01/05/2016  . Pneumococcal Conjugate-13 11/12/2011  . Pneumococcal Polysaccharide-23 09/30/2012  . Tdap 03/25/2013  . Zoster 10/01/2012    Health  Maintenance  Topic Date Due  . FOOT EXAM  05/05/2015  . OPHTHALMOLOGY EXAM  08/10/2016  . INFLUENZA VACCINE  10/11/2016  . HEMOGLOBIN A1C  03/03/2017  . TETANUS/TDAP  03/26/2023  . COLONOSCOPY  11/24/2024  . Hepatitis C Screening  Completed  . PNA vac Low Risk Adult  Completed    Lab Results  Component Value Date   WBC 12.7 (H) 09/04/2016   HGB 14.0 09/04/2016   HCT 41.7 09/04/2016   PLT 327 09/04/2016   GLUCOSE 94 09/04/2016   CHOL 123  09/02/2016   TRIG 67 09/02/2016   HDL 45 09/02/2016   LDLCALC 65 09/02/2016   ALT 37 09/04/2016   AST 28 09/04/2016   NA 141 09/04/2016   K 3.9 09/04/2016   CL 101 09/04/2016   CREATININE 1.43 (H) 09/04/2016   BUN 26 (H) 09/04/2016   CO2 30 09/04/2016   TSH 0.736 09/01/2016   PSA 4.96 (H) 02/26/2015   INR 1.23 09/01/2016   HGBA1C 5.2 09/01/2016   MICROALBUR 69.1 02/26/2015    Lab Results  Component Value Date   TSH 0.736 09/01/2016   Lab Results  Component Value Date   WBC 12.7 (H) 09/04/2016   HGB 14.0 09/04/2016   HCT 41.7 09/04/2016   MCV 82.7 09/04/2016   PLT 327 09/04/2016   Lab Results  Component Value Date   NA 141 09/04/2016   K 3.9 09/04/2016   CO2 30 09/04/2016   GLUCOSE 94 09/04/2016   BUN 26 (H) 09/04/2016   CREATININE 1.43 (H) 09/04/2016   BILITOT 0.4 09/04/2016   ALKPHOS 64 09/04/2016   AST 28 09/04/2016   ALT 37 09/04/2016   PROT 6.9 09/04/2016   ALBUMIN 3.2 (L) 09/04/2016   CALCIUM 9.1 09/04/2016   ANIONGAP 10 09/04/2016   GFR 40.79 (L) 03/09/2016   Lab Results  Component Value Date   CHOL 123 09/02/2016   Lab Results  Component Value Date   HDL 45 09/02/2016   Lab Results  Component Value Date   LDLCALC 65 09/02/2016   Lab Results  Component Value Date   TRIG 67 09/02/2016   Lab Results  Component Value Date   CHOLHDL 2.7 09/02/2016   Lab Results  Component Value Date   HGBA1C 5.2 09/01/2016         Assessment & Plan:   Problem List Items Addressed This Visit       Unprioritized   Acute renal failure superimposed on chronic kidney disease ([place])   Relevant Orders   CBC with Differential/Platelet   Comprehensive metabolic panel   Magnesium   Phosphorus   DG Bone Density   Cellulitis of leg, right   Relevant Orders   CBC with Differential/Platelet   Comprehensive metabolic panel   Magnesium   Phosphorus   Gout attack   Relevant Orders   Uric acid   DG Bone Density   Hypophosphatemia   Relevant Orders   Phosphorus   DG Bone Density    Other Visit Diagnoses    History of sepsis    -  Primary   Prostate cancer ([place])       Relevant Orders   CBC with Differential/Platelet   Comprehensive metabolic panel   Magnesium   Phosphorus   Loss of height       Relevant Orders   DG Bone Density   Bone loss       Relevant Orders   DG Bone Density      I have discontinued Mr. Hnat Garlic (GARLIQUE PO) and traMADol. I am also having him maintain his aspirin, albuterol, ONETOUCH DELICA LANCETS FINE, diltiazem, glucose blood, colchicine, valsartan, cephALEXin, chlorthalidone, and tiotropium.  Meds ordered this encounter  Medications  . chlorthalidone (HYGROTON) 25 MG tablet    Sig: Take 25 mg by mouth daily.  Marland Kitchen tiotropium (SPIRIVA) 18 MCG inhalation capsule    Sig: Place 18 mcg into inhaler and inhale daily.    CMA served as Education administrator during this visit. History, Physical and Plan performed by medical provider. Documentation and  orders reviewed and attested to.  Ann Held, DO

## 2016-09-11 ENCOUNTER — Encounter: Payer: Self-pay | Admitting: Vascular Surgery

## 2016-09-11 ENCOUNTER — Other Ambulatory Visit (INDEPENDENT_AMBULATORY_CARE_PROVIDER_SITE_OTHER): Payer: Medicare Other

## 2016-09-11 DIAGNOSIS — N189 Chronic kidney disease, unspecified: Secondary | ICD-10-CM | POA: Diagnosis not present

## 2016-09-11 DIAGNOSIS — N179 Acute kidney failure, unspecified: Secondary | ICD-10-CM

## 2016-09-11 DIAGNOSIS — L03115 Cellulitis of right lower limb: Secondary | ICD-10-CM | POA: Diagnosis not present

## 2016-09-11 DIAGNOSIS — C61 Malignant neoplasm of prostate: Secondary | ICD-10-CM

## 2016-09-11 DIAGNOSIS — M109 Gout, unspecified: Secondary | ICD-10-CM | POA: Diagnosis not present

## 2016-09-11 LAB — COMPREHENSIVE METABOLIC PANEL
ALBUMIN: 3.6 g/dL (ref 3.5–5.2)
ALK PHOS: 63 U/L (ref 39–117)
ALT: 25 U/L (ref 0–53)
AST: 16 U/L (ref 0–37)
BUN: 19 mg/dL (ref 6–23)
CALCIUM: 9.5 mg/dL (ref 8.4–10.5)
CHLORIDE: 100 meq/L (ref 96–112)
CO2: 35 mEq/L — ABNORMAL HIGH (ref 19–32)
Creatinine, Ser: 1.47 mg/dL (ref 0.40–1.50)
GFR: 50.13 mL/min — AB (ref >60.00)
Glucose, Bld: 86 mg/dL (ref 70–99)
POTASSIUM: 3.9 meq/L (ref 3.5–5.1)
SODIUM: 141 meq/L (ref 135–145)
TOTAL PROTEIN: 6.6 g/dL (ref 6.0–8.3)
Total Bilirubin: 0.5 mg/dL (ref 0.2–1.2)

## 2016-09-11 LAB — CBC WITH DIFFERENTIAL/PLATELET
BASOS PCT: 1.1 % (ref 0.0–3.0)
Basophils Absolute: 0.1 10*3/uL (ref 0.0–0.1)
EOS PCT: 3.9 % (ref 0.0–5.0)
Eosinophils Absolute: 0.3 10*3/uL (ref 0.0–0.7)
HEMATOCRIT: 43.6 % (ref 39.0–52.0)
HEMOGLOBIN: 14.6 g/dL (ref 13.0–17.0)
Lymphocytes Relative: 22.7 % (ref 12.0–46.0)
Lymphs Abs: 1.8 10*3/uL (ref 0.7–4.0)
MCHC: 33.5 g/dL (ref 30.0–36.0)
MCV: 85.8 fl (ref 78.0–100.0)
MONO ABS: 0.7 10*3/uL (ref 0.1–1.0)
MONOS PCT: 9.3 % (ref 3.0–12.0)
Neutro Abs: 5 10*3/uL (ref 1.4–7.7)
Neutrophils Relative %: 63 % (ref 43.0–77.0)
Platelets: 285 10*3/uL (ref 150.0–400.0)
RBC: 5.08 Mil/uL (ref 4.22–5.81)
RDW: 14.1 % (ref 11.5–15.5)
WBC: 8 10*3/uL (ref 4.0–10.5)

## 2016-09-11 LAB — URIC ACID: Uric Acid, Serum: 10.4 mg/dL — ABNORMAL HIGH (ref 4.0–7.8)

## 2016-09-11 LAB — MAGNESIUM: MAGNESIUM: 1.9 mg/dL (ref 1.5–2.5)

## 2016-09-11 LAB — PHOSPHORUS: PHOSPHORUS: 2.9 mg/dL (ref 2.3–4.6)

## 2016-09-12 ENCOUNTER — Encounter (HOSPITAL_COMMUNITY): Payer: Medicare Other

## 2016-09-12 ENCOUNTER — Ambulatory Visit: Payer: Medicare Other | Admitting: Vascular Surgery

## 2016-09-14 ENCOUNTER — Ambulatory Visit (HOSPITAL_BASED_OUTPATIENT_CLINIC_OR_DEPARTMENT_OTHER)
Admission: RE | Admit: 2016-09-14 | Discharge: 2016-09-14 | Disposition: A | Discharge status: Home / Self Care | Payer: Medicare Other | Source: Ambulatory Visit | Attending: Family Medicine | Admitting: Family Medicine

## 2016-09-14 DIAGNOSIS — Z13820 Encounter for screening for osteoporosis: Secondary | ICD-10-CM | POA: Diagnosis not present

## 2016-09-14 DIAGNOSIS — M898X9 Other specified disorders of bone, unspecified site: Secondary | ICD-10-CM | POA: Diagnosis not present

## 2016-09-14 DIAGNOSIS — M8580 Other specified disorders of bone density and structure, unspecified site: Secondary | ICD-10-CM

## 2016-09-14 DIAGNOSIS — R29890 Loss of height: Secondary | ICD-10-CM | POA: Diagnosis not present

## 2016-09-14 DIAGNOSIS — N179 Acute kidney failure, unspecified: Secondary | ICD-10-CM | POA: Insufficient documentation

## 2016-09-14 DIAGNOSIS — M109 Gout, unspecified: Secondary | ICD-10-CM | POA: Insufficient documentation

## 2016-09-14 DIAGNOSIS — N189 Chronic kidney disease, unspecified: Secondary | ICD-10-CM | POA: Insufficient documentation

## 2016-09-15 ENCOUNTER — Encounter: Payer: Self-pay | Admitting: Vascular Surgery

## 2016-09-18 ENCOUNTER — Ambulatory Visit (INDEPENDENT_AMBULATORY_CARE_PROVIDER_SITE_OTHER): Payer: Medicare Other | Admitting: Vascular Surgery

## 2016-09-18 ENCOUNTER — Encounter: Payer: Self-pay | Admitting: Vascular Surgery

## 2016-09-18 VITALS — BP 133/80 | HR 58 | Temp 98.5°F | Resp 18 | Ht 73.0 in | Wt 308.0 lb

## 2016-09-18 DIAGNOSIS — I83892 Varicose veins of left lower extremities with other complications: Secondary | ICD-10-CM

## 2016-09-18 HISTORY — PX: ENDOVENOUS ABLATION SAPHENOUS VEIN W/ LASER: SUR449

## 2016-09-18 NOTE — Progress Notes (Signed)
Subjective:     [name], male   DOB: 07-01-1945, 71 y.o.   MRN: [id number]  HPI This 71 year old male had laser ablation left great saphenous vein from the midcalf to near the saphenofemoral junction performed under local tumescent anesthesia. A total of 2700 J of energy was utilized. He tolerated the procedure well.  Review of Systems     Objective:   Physical Exam BP 133/80 (BP Location: Left Arm, Patient Position: Sitting, Cuff Size: Large)   Pulse (!) 58   Temp 98.5 F (36.9 C) (Oral)   Resp 18   Ht 6\' 1"  (1.854 m)   Wt (!) 308 lb (139.7 kg)   SpO2 95%   BMI 40.64 kg/m        Assessment:     Well-tolerated laser ablation left great saphenous vein performed under local tumescent anesthesia    Plan:     Return in 1 week for venous duplex exam to confirm closure left great saphenous vein Patient will then have similar procedure on left small saphenous vein in the near future

## 2016-09-18 NOTE — Progress Notes (Signed)
Laser Ablation Procedure    Date: 09/18/2016   Travis Daniels DOB:01-06-1946  Consent signed: Yes    Surgeon:  Dr. Nelda Severe. Kellie Simmering  Procedure: Laser Ablation: left Greater Saphenous Vein  BP 133/80 (BP Location: Left Arm, Patient Position: Sitting, Cuff Size: Large)   Pulse (!) 58   Temp 98.5 F (36.9 C) (Oral)   Resp 18   Ht 6\' 1"  (1.854 m)   Wt (!) 308 lb (139.7 kg)   SpO2 95%   BMI 40.64 kg/m   Tumescent Anesthesia: 475 cc 0.9% NaCl with 50 cc Lidocaine HCL with 1% Epi and 15 cc 8.4% NaHCO3  Local Anesthesia: 3 cc Lidocaine HCL and NaHCO3 (ratio 2:1)  Pulsed Mode: 15 watts, 535ms delay, 1.0 duration  Total Energy:  2705 Joules            Total Pulses:   182             Total Time: 3:00      Patient tolerated procedure well    Description of Procedure:  After marking the course of the secondary varicosities, the patient was placed on the operating table in the supine position, and the left leg was prepped and draped in sterile fashion.   Local anesthetic was administered and under ultrasound guidance the saphenous vein was accessed with a micro needle and guide wire; then the mirco puncture sheath was placed.  A guide wire was inserted saphenofemoral junction , followed by a 5 french sheath.  The position of the sheath and then the laser fiber below the junction was confirmed using the ultrasound.  Tumescent anesthesia was administered along the course of the saphenous vein using ultrasound guidance. The patient was placed in Trendelenburg position and protective laser glasses were placed on patient and staff, and the laser was fired at 15 watts continuous mode advancing 1-63mm/second for a total of 2705 joules.       Steri strip was applied to the IV insertion site and ABD pads and thigh high compression stockings were applied.  Ace wrap bandages were applied over the left thigh and calf and at the top of the saphenofemoral junction. Blood loss was less than 15 cc.  The  patient ambulated out of the operating room having tolerated the procedure well.

## 2016-09-21 ENCOUNTER — Telehealth: Payer: Self-pay | Admitting: Family Medicine

## 2016-09-21 NOTE — Telephone Encounter (Signed)
Patient contacted see lab results.

## 2016-09-21 NOTE — Telephone Encounter (Signed)
Pt is returning call for lab results  

## 2016-09-26 ENCOUNTER — Encounter (HOSPITAL_COMMUNITY): Payer: Medicare Other

## 2016-09-26 ENCOUNTER — Ambulatory Visit: Payer: Medicare Other | Admitting: Vascular Surgery

## 2016-09-27 ENCOUNTER — Ambulatory Visit (HOSPITAL_COMMUNITY)
Admission: RE | Admit: 2016-09-27 | Discharge: 2016-09-27 | Disposition: A | Discharge status: Home / Self Care | Payer: Medicare Other | Source: Ambulatory Visit | Attending: Vascular Surgery | Admitting: Vascular Surgery

## 2016-09-27 ENCOUNTER — Ambulatory Visit (INDEPENDENT_AMBULATORY_CARE_PROVIDER_SITE_OTHER): Payer: Medicare Other | Admitting: Vascular Surgery

## 2016-09-27 ENCOUNTER — Encounter: Payer: Self-pay | Admitting: Vascular Surgery

## 2016-09-27 VITALS — BP 136/80 | HR 55 | Temp 98.0°F | Resp 16 | Ht 72.0 in | Wt 311.0 lb

## 2016-09-27 DIAGNOSIS — I83892 Varicose veins of left lower extremities with other complications: Secondary | ICD-10-CM | POA: Insufficient documentation

## 2016-09-27 DIAGNOSIS — Z9889 Other specified postprocedural states: Secondary | ICD-10-CM | POA: Diagnosis not present

## 2016-09-27 NOTE — Progress Notes (Signed)
Subjective:     [name], male   DOB: [birth year], 71 y.o.   MRN: [id number]  HPI This 71 year old male returns 1 week post-laser ablation left great saphenous vein for severe reflux with chronic edema, painful varicosities, and skin changes. He also needs closure of the left small saphenous vein. He has worn his elastic compression stockings and take an ibuprofen as instructed. He has had mild discomfort in the mid to proximal thigh overlying the great saphenous vein but that has now resolved. He's had no change in his distal edema.  Past Medical History:  Diagnosis Date  . Arthritis   . B12 deficiency   . COPD (chronic obstructive pulmonary disease) ([place]) 01/2011   FVC 47%, FEV1 49%   . 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 Hyperlipidemia   . Hypertension   . Lower extremity edema   . Secondary pulmonary hypertension 01/2011   RV syst pressure 30-40 mm Hg on ECHO     Social History  Substance Use Topics  . Smoking status: Never Smoker  . Smokeless tobacco: Never Used     Comment: occ alcohol  . Alcohol use 0.0 oz/week     Comment: 2-3 drinks occasionally    Family History  Problem Relation Age of Onset  . CAD Mother        MI in her 25s  . Cancer Father        colon and pancreas  . Epilepsy Son     No Known Allergies   Current Outpatient Prescriptions:  .  albuterol (PROVENTIL HFA;VENTOLIN HFA) 108 (90 BASE) MCG/ACT inhaler, Inhale 2 puffs into the lungs every 6 (six) hours as needed for wheezing or shortness of breath., Disp: , Rfl:  .  aspirin 81 MG tablet, Take 81 mg by mouth daily., Disp: , Rfl:  .  cephALEXin (KEFLEX) 500 MG capsule, Take 1 capsule (500 mg total) by mouth every 6 (six) hours., Disp: 20 capsule, Rfl: 0 .  chlorthalidone (HYGROTON) 25 MG tablet, Take 25 mg by mouth daily., Disp: , Rfl:  .  colchicine 0.6 MG tablet, Take 1 tablet (0.6 mg total) by mouth daily., Disp: 30 tablet, Rfl: 0 .  diltiazem (CARDIZEM CD) 300  MG 24 hr capsule, Take 1 capsule (300 mg total) by mouth daily., Disp: 90 capsule, Rfl: 3 .  glucose blood (ONE TOUCH ULTRA TEST) test strip, CHECK BLOOD SUGAR TWICE DAILY, Disp: 100 each, Rfl: 5 .  ONETOUCH DELICA LANCETS FINE MISC, Check blood sugar daily, Disp: 100 each, Rfl: 12 .  tiotropium (SPIRIVA) 18 MCG inhalation capsule, Place 18 mcg into inhaler and inhale daily., Disp: , Rfl:  .  valsartan (DIOVAN) 160 MG tablet, Take 1 tablet (160 mg total) by mouth daily., Disp: 180 tablet, Rfl: 0  Vitals:   09/27/16 1030  BP: 136/80  Pulse: (!) 55  Resp: 16  Temp: 98 F (36.7 C)  SpO2: 93%  Weight: (!) 311 lb (141.1 kg)  Height: 6' (1.829 m)    Body mass index is 42.18 kg/m.       Marland KitchenReview of Systems Denies chest pain but does have mild chronic dyspnea on exertion. No hemoptysis or claudication.    Objective:   Physical Exam BP 136/80   Pulse (!) 55   Temp 98 F (36.7 C)   Resp 16   Ht 6' (1.829 m)   Wt (!) 311 lb (141.1 kg)   SpO2 93%  BMI 42.18 kg/m   Gen. obese male no apparent distress alert and oriented 3 Lungs no rhonchi or wheezing Cardiovascular regular rhythm no murmurs Left leg with mild discomfort to deep palpation over great saphenous vein from inguinal crease 2 distal thigh. Chronic hyperpigmentation is totally with no active ulceration. 2+ dorsalis pedis pulse palpable.  Today I ordered a venous duplex exam the left leg which I reviewed and interpreted. There is no DVT. There is total closure of the left great saphenous vein from the midcalf to near the saphenofemoral junction     Assessment:     Successful laser ablation left great saphenous vein for gross reflux with painful varicosities, swelling, and skin changes    Plan:     Plan similar procedure and left small saphenous vein on July 30

## 2016-09-29 ENCOUNTER — Encounter: Payer: Self-pay | Admitting: Vascular Surgery

## 2016-09-30 ENCOUNTER — Telehealth: Payer: Self-pay | Admitting: Family Medicine

## 2016-09-30 DIAGNOSIS — E1151 Type 2 diabetes mellitus with diabetic peripheral angiopathy without gangrene: Secondary | ICD-10-CM

## 2016-10-02 ENCOUNTER — Encounter: Payer: Self-pay | Admitting: Vascular Surgery

## 2016-10-03 ENCOUNTER — Encounter: Payer: Self-pay | Admitting: Radiation Oncology

## 2016-10-03 DIAGNOSIS — R3129 Other microscopic hematuria: Secondary | ICD-10-CM | POA: Diagnosis not present

## 2016-10-03 DIAGNOSIS — C61 Malignant neoplasm of prostate: Secondary | ICD-10-CM | POA: Diagnosis not present

## 2016-10-03 DIAGNOSIS — N281 Cyst of kidney, acquired: Secondary | ICD-10-CM | POA: Diagnosis not present

## 2016-10-03 NOTE — Telephone Encounter (Signed)
Pt called in to follow up on refill request. Pt would like to have sent to pharmacy as soon as able.    CB: [phone]

## 2016-10-04 MED ORDER — GLUCOSE BLOOD VI STRP: ORAL_STRIP | 1 refills | Status: DC

## 2016-10-04 NOTE — Addendum Note (Signed)
Addended by: Kem Boroughs D on: 10/04/2016 08:00 AM   Modules accepted: Orders

## 2016-10-04 NOTE — Telephone Encounter (Signed)
Patient notified that rx was sent in  

## 2016-10-09 ENCOUNTER — Ambulatory Visit (INDEPENDENT_AMBULATORY_CARE_PROVIDER_SITE_OTHER): Payer: Medicare Other | Admitting: Vascular Surgery

## 2016-10-09 ENCOUNTER — Encounter: Payer: Self-pay | Admitting: Vascular Surgery

## 2016-10-09 VITALS — BP 133/79 | HR 60 | Temp 98.4°F | Resp 18 | Ht 72.0 in | Wt 311.0 lb

## 2016-10-09 DIAGNOSIS — I83892 Varicose veins of left lower extremities with other complications: Secondary | ICD-10-CM

## 2016-10-09 HISTORY — PX: ENDOVENOUS ABLATION SAPHENOUS VEIN W/ LASER: SUR449

## 2016-10-09 NOTE — Progress Notes (Signed)
Laser Ablation Procedure    Date: 10/09/2016   Travis Daniels DOB:05-19-1945  Consent signed: Yes    Surgeon:  Dr. Nelda Severe. Kellie Simmering  Procedure: Laser Ablation: left Small Saphenous Vein  BP 133/79 (BP Location: Left Arm, Patient Position: Sitting, Cuff Size: Large)   Pulse 60   Temp 98.4 F (36.9 C) (Oral)   Resp 18   Ht 6' (1.829 m)   Wt (!) 311 lb (141.1 kg)   SpO2 96%   BMI 42.18 kg/m   Tumescent Anesthesia: 250 cc 0.9% NaCl with 50 cc Lidocaine HCL with 1% Epi and 15 cc 8.4% NaHCO3  Local Anesthesia: 4 cc Lidocaine HCL and NaHCO3 (ratio 2:1)  Pulsed Mode: 15 watts, 552ms delay, 1.0 duration  Total Energy: 1052 Joules             Total Pulses:  69              Total Time: 1:09      Patient tolerated procedure well    Description of Procedure:  After marking the course of the secondary varicosities, the patient was placed on the operating table in the prone position, and the left leg was prepped and draped in sterile fashion.   Local anesthetic was administered and under ultrasound guidance the saphenous vein was accessed with a micro needle and guide wire; then the mirco puncture sheath was placed.  A guide wire was inserted saphenopopliteal junction , followed by a 5 french sheath.  The position of the sheath and then the laser fiber below the junction was confirmed using the ultrasound.  Tumescent anesthesia was administered along the course of the saphenous vein using ultrasound guidance. The patient was placed in Trendelenburg position and protective laser glasses were placed on patient and staff, and the laser was fired at 15 watts continuous mode advancing 1-103mm/second for a total of 1052 joules.       Steri strip was applied to the IV insertion site and ABD pads and thigh high compression stockings were applied.  Ace wrap bandages were applied over the left calf and at the top of the saphenopopliteal junction. Blood loss was less than 15 cc.  The patient ambulated  out of the operating room having tolerated the procedure well.

## 2016-10-09 NOTE — Progress Notes (Signed)
Subjective:     [name], male   DOB: 04-27-1945, 71 y.o.   MRN: [id number]  HPI This 71 year old male had laser ablation of the left small saphenous vein formed under local tumescent anesthesia. A total of 1032 J of energy was utilized. He tolerated the procedure well. The tissues in the lower half of the leg were quite firm and indurated from chronic inflammation.  Review of Systems     Objective:   Physical Exam BP 133/79 (BP Location: Left Arm, Patient Position: Sitting, Cuff Size: Large)   Pulse 60   Temp 98.4 F (36.9 C) (Oral)   Resp 18   Ht 6' (1.829 m)   Wt (!) 311 lb (141.1 kg)   SpO2 96%   BMI 42.18 kg/m        Assessment:     Well-tolerated laser ablation left small saphenous vein performed under local tumescent anesthesia    Plan:     Return in 1 week for venous duplex exam to confirm closure left small saphenous vein

## 2016-10-12 NOTE — Progress Notes (Addendum)
GU Location of Tumor / Histology: Prostatic Adenocarcinoma  If Prostate Cancer, Gleason Score is ( 3+3) on 08/28/2016 and PSA is (6.70) on 06/13/2016. Prostate volume: 66 cc.  Travis Daniels reports that his PSA has been elevated for a couple years. He reports he was originally referred to Dr. Tresa Moore by his PCP. Recently, Dr. Tresa Moore referred him to Dr. Tresa Moore for elevated PSA.Marland Kitchen   Biopsies of prostate revealed: 08/28/2016   Past/Anticipated interventions by urology, if any: Prostate Needle Biopsy 08/28/2016 (first and only biopsy)  Past/Anticipated interventions by medical oncology, if any: Na  Weight changes, if any: Na  Bowel/Bladder complaints, if any: IPSS 0. Denies dysuria, leakage or incontinence. Reports hematuria isn't visible but, is found with urinalysis.   Nausea/Vomiting, if any: no  Pain issues, if any:  no  SAFETY ISSUES:  Prior radiation? No  Pacemaker/ICD? no  Possible current pregnancy? no  Is the patient on methotrexate? no  Current Complaints / other details:  Travis Daniels" [place]. Married. Only child. Hasn't had sexual activity in 15 years. Strong family history of cancer.

## 2016-10-16 ENCOUNTER — Encounter: Payer: Self-pay | Admitting: Medical Oncology

## 2016-10-16 ENCOUNTER — Ambulatory Visit
Admission: RE | Admit: 2016-10-16 | Discharge: 2016-10-16 | Disposition: A | Discharge status: Home / Self Care | Payer: Medicare Other | Source: Ambulatory Visit | Attending: Radiation Oncology | Admitting: Radiation Oncology

## 2016-10-16 ENCOUNTER — Ambulatory Visit (INDEPENDENT_AMBULATORY_CARE_PROVIDER_SITE_OTHER): Payer: Medicare Other | Admitting: Vascular Surgery

## 2016-10-16 ENCOUNTER — Encounter: Payer: Self-pay | Admitting: Radiation Oncology

## 2016-10-16 ENCOUNTER — Encounter: Payer: Self-pay | Admitting: Vascular Surgery

## 2016-10-16 ENCOUNTER — Ambulatory Visit (HOSPITAL_COMMUNITY)
Admission: RE | Admit: 2016-10-16 | Discharge: 2016-10-16 | Disposition: A | Discharge status: Home / Self Care | Payer: Medicare Other | Source: Ambulatory Visit | Attending: Vascular Surgery | Admitting: Vascular Surgery

## 2016-10-16 VITALS — BP 143/80 | HR 53 | Temp 98.1°F | Resp 20 | Ht 71.0 in | Wt 312.6 lb

## 2016-10-16 VITALS — BP 142/83 | HR 62 | Resp 16 | Ht 71.0 in | Wt 310.4 lb

## 2016-10-16 DIAGNOSIS — Z7982 Long term (current) use of aspirin: Secondary | ICD-10-CM | POA: Diagnosis not present

## 2016-10-16 DIAGNOSIS — Z9889 Other specified postprocedural states: Secondary | ICD-10-CM | POA: Diagnosis not present

## 2016-10-16 DIAGNOSIS — Z8051 Family history of malignant neoplasm of kidney: Secondary | ICD-10-CM | POA: Diagnosis not present

## 2016-10-16 DIAGNOSIS — I83892 Varicose veins of left lower extremities with other complications: Secondary | ICD-10-CM

## 2016-10-16 DIAGNOSIS — I10 Essential (primary) hypertension: Secondary | ICD-10-CM | POA: Diagnosis not present

## 2016-10-16 DIAGNOSIS — Z808 Family history of malignant neoplasm of other organs or systems: Secondary | ICD-10-CM | POA: Insufficient documentation

## 2016-10-16 DIAGNOSIS — C61 Malignant neoplasm of prostate: Secondary | ICD-10-CM

## 2016-10-16 DIAGNOSIS — R972 Elevated prostate specific antigen [PSA]: Secondary | ICD-10-CM | POA: Diagnosis not present

## 2016-10-16 DIAGNOSIS — I82812 Embolism and thrombosis of superficial veins of left lower extremities: Secondary | ICD-10-CM | POA: Diagnosis not present

## 2016-10-16 DIAGNOSIS — Z8042 Family history of malignant neoplasm of prostate: Secondary | ICD-10-CM | POA: Insufficient documentation

## 2016-10-16 DIAGNOSIS — Z79899 Other long term (current) drug therapy: Secondary | ICD-10-CM | POA: Diagnosis not present

## 2016-10-16 DIAGNOSIS — Z809 Family history of malignant neoplasm, unspecified: Secondary | ICD-10-CM

## 2016-10-16 HISTORY — DX: Malignant neoplasm of prostate: C61

## 2016-10-16 NOTE — Progress Notes (Signed)
Introduced myself to Mr. Travis Daniels and his wife as the prostate oncology nurse navigator and my role. He is here today to consult with Dr. Tammi Klippel regarding treatment options. I will continue to follow and asked them to call with questions or concerns.

## 2016-10-16 NOTE — Progress Notes (Signed)
Radiation Oncology         [phone]) [phone] ________________________________  Initial outpatient Consultation  Name: Travis Daniels MRN: [id number]  Date: 10/16/2016  DOB: 1945/10/05  BP:ZWCHE Chase, [name], DO  Alexis Frock, MD   REFERRING PHYSICIAN: Alexis Frock, MD  DIAGNOSIS: 71 yo man with stage T1c adenocarcinoma of the prostate with a Gleason Score of 3+3 and a PSA of 6.7    ICD-10-CM   1. Malignant neoplasm of prostate (Travis Daniels) C61   2. Family history of cancer Z80.9     HISTORY OF PRESENT ILLNESS: [name] is a 71 y.o. male with a diagnosis of prostate cancer. He was noted to have a progressively elevating PSA of 6.70 by his primary care physician, Dr. Etter Sjogren.  Accordingly, he was referred for evaluation in urology and saw Dr. Tresa Moore on 08/28/16,  digital rectal examination was performed at that time revealing no palpable nodules.  The patient proceeded to transrectal ultrasound with 12 biopsies of the prostate the same day.  The prostate volume measured 66 cc.  Out of 12 core biopsies,4 were positive.  The maximum Gleason score was 3+3, and this was seen in the right lateral base, left mid apex, mid apex, and right apex.  The patient reviewed the biopsy results with his urologist and he has kindly been referred today for discussion of potential radiation treatment options.   PREVIOUS RADIATION THERAPY: No  PAST MEDICAL HISTORY:  Past Medical History:  Diagnosis Date  . Arthritis   . B12 deficiency   . COPD (chronic obstructive pulmonary disease) ([place]) 01/2011   FVC 47%, FEV1 49%   . 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 Hyperlipidemia   . Hypertension   . Lower extremity edema   . Prostate cancer ([place])   . Secondary pulmonary hypertension 01/2011   RV syst pressure 30-40 mm Hg on ECHO       PAST SURGICAL HISTORY: Past Surgical History:  Procedure Laterality Date  . COLONOSCOPY W/ POLYPECTOMY  12/19/07   one small polyp, sigmoid  diverticulosis, lipoma rectum, agioectasia/AVM in descending colon (Dr. Ronelle Nigh at [facility] in [place], [place])  . ENDOVENOUS ABLATION SAPHENOUS VEIN W/ LASER Left 09/18/2016   endovenous laser ablation left greater saphenous vein by Tinnie Gens MD   . ENDOVENOUS ABLATION SAPHENOUS VEIN W/ LASER Left 10/09/2016   endovenous laser ablation left small saphenous vein by Tinnie Gens MD   . FINGER SURGERY Right 11   rt middle finger  . PROSTATE BIOPSY    . TOTAL SHOULDER ARTHROPLASTY Right 01/21/2013   Procedure: SHOULDER HEMI ARTHROPLASTY;  Surgeon: Garald Balding, MD;  Location: [facility];  Service: Orthopedics;  Laterality: Right;  . TRANSTHORACIC ECHOCARDIOGRAM  01/2011   EF 55-60%, mild asymmetric LVH, elevated RV systolic pressure  . VASECTOMY      FAMILY HISTORY:  Family History  Problem Relation Age of Onset  . CAD Mother        MI in her 43s  . Cancer Father        colon and pancreas  . Epilepsy Son   . Cancer Maternal Grandmother        "stomach"  . Cancer Maternal Grandfather        prostate; died at the age of 17  . Cancer Cousin        paternal first cousin kidney    SOCIAL HISTORY:  Social History   Social History  . Marital  status: Married    Spouse name: N/A  . Number of children: 4  . Years of education: N/A   Occupational History  . retired    Social History Main Topics  . Smoking status: Never Smoker  . Smokeless tobacco: Never Used     Comment: occ alcohol  . Alcohol use 0.0 oz/week     Comment: 2-3 drinks occasionally  . Drug use: No  . Sexual activity: Yes   Other Topics Concern  . Not on file   Social History Narrative   Mr. Travis Daniels lives with his wife in [place]. He is recently retired and relocated to [facility] from [place]. Historically, his medical provider has been [facility] in [place].    ALLERGIES: Patient has no known allergies.  MEDICATIONS:  Current Outpatient Prescriptions  Medication Sig Dispense Refill  . albuterol (PROVENTIL  HFA;VENTOLIN HFA) 108 (90 BASE) MCG/ACT inhaler Inhale 2 puffs into the lungs every 6 (six) hours as needed for wheezing or shortness of breath.    Marland Kitchen aspirin 81 MG tablet Take 81 mg by mouth daily.    . chlorthalidone (HYGROTON) 25 MG tablet Take 25 mg by mouth daily.    Marland Kitchen diltiazem (CARDIZEM CD) 300 MG 24 hr capsule Take 1 capsule (300 mg total) by mouth daily. 90 capsule 3  . glucose blood (ONE TOUCH ULTRA TEST) test strip CHECK BLOOD SUGAR TWICE DAILY.   DX Code: E11.9 100 each 1  . ibuprofen (ADVIL,MOTRIN) 200 MG tablet Take 200 mg by mouth every 6 (six) hours as needed.    Glory Rosebush DELICA LANCETS FINE MISC Check blood sugar daily 100 each 12  . tiotropium (SPIRIVA) 18 MCG inhalation capsule Place 18 mcg into inhaler and inhale daily.    . valsartan (DIOVAN) 160 MG tablet Take 1 tablet (160 mg total) by mouth daily. 180 tablet 0   No current facility-administered medications for this encounter.     REVIEW OF SYSTEMS:  On review of systems, the patient reports that he is doing well overall. He denies any chest pain, shortness of breath, cough, fevers, chills, night sweats, unintended weight changes. He denies any bowel disturbances, and denies abdominal pain, nausea or vomiting. He denies any new musculoskeletal or joint aches or pains. His IPSS was 0, indicating no urinary symptoms. He is able to complete sexual activity with most attempts. A complete review of systems is obtained and is otherwise negative.    PHYSICAL EXAM:  Wt Readings from Last 3 Encounters:  10/16/16 (!) 312 lb 9.6 oz (141.8 kg)  10/16/16 (!) 310 lb 6.4 oz (140.8 kg)  10/09/16 (!) 311 lb (141.1 kg)   Temp Readings from Last 3 Encounters:  10/16/16 98.1 F (36.7 C) (Oral)  10/09/16 98.4 F (36.9 C) (Oral)  09/27/16 98 F (36.7 C)   BP Readings from Last 3 Encounters:  10/16/16 (!) 143/80  10/16/16 (!) 142/83  10/09/16 133/79   Pulse Readings from Last 3 Encounters:  10/16/16 (!) 53  10/16/16 62    10/09/16 60   Pain Assessment Pain Score: 0-No pain/10  In general this is a well appearing Caucasian in no acute distress. She is alert and oriented x4 and appropriate throughout the examination. HEENT reveals that the patient is normocephalic, atraumatic. EOMs are intact. PERRLA. Skin is intact without any evidence of gross lesions. Cardiovascular exam reveals a regular rate and rhythm, no clicks rubs or murmurs are auscultated. Chest is clear to auscultation bilaterally. Lymphatic assessment is performed and  does not reveal any adenopathy in the cervical, supraclavicular, axillary, or inguinal chains. Abdomen has active bowel sounds in all quadrants and is intact. The abdomen is soft, non tender, non distended. Lower extremities are negative for pretibial pitting edema, deep calf tenderness, cyanosis or clubbing.   KPS = 100  100 - Normal; no complaints; no evidence of disease. 90   - Able to carry on normal activity; minor signs or symptoms of disease. 80   - Normal activity with effort; some signs or symptoms of disease. 60   - Cares for self; unable to carry on normal activity or to do active work. 60   - Requires occasional assistance, but is able to care for most of his personal needs. 50   - Requires considerable assistance and frequent medical care. 81   - Disabled; requires special care and assistance. 25   - Severely disabled; hospital admission is indicated although death not imminent. 75   - Very sick; hospital admission necessary; active supportive treatment necessary. 10   - Moribund; fatal processes progressing rapidly. 0     - Dead  Karnofsky DA, Abelmann [place], Craver LS and Burchenal [facility] [phone]) The use of the nitrogen mustards in the palliative treatment of carcinoma: with particular reference to bronchogenic carcinoma Cancer 1 [phone]  LABORATORY DATA:  Lab Results  Component Value Date   WBC 8.0 09/11/2016   HGB 14.6 09/11/2016   HCT 43.6 09/11/2016   MCV 85.8  09/11/2016   PLT 285.0 09/11/2016   Lab Results  Component Value Date   NA 141 09/11/2016   K 3.9 09/11/2016   CL 100 09/11/2016   CO2 35 (H) 09/11/2016   Lab Results  Component Value Date   ALT 25 09/11/2016   AST 16 09/11/2016   ALKPHOS 63 09/11/2016   BILITOT 0.5 09/11/2016     RADIOGRAPHY: No results found.    IMPRESSION/PLAN: 1. 71 y.o. gentleman with low risk, Stage T1c adenocarcinoma of the prostate with Gleason Score of 3+3, and PSA of 6.70. We discussed the pathology findings and reviews the nature of low risk prostate disease. We discussed how T staging, Gleason's Score, and PSA are used to determining treatment strategies in the management of prostate cancer. We discussed options of active surveillance versus treatment. His treatment options include prostatectomy, external radiotherapy, or radioactive seed implant as brachytherapy. We discussed the risks, benefits, short, and long term effects of radiotherapy, and the patient is still going to consider his options. We provided or contact information to him and will plan to share discussion with Dr. Tresa Moore as well. I will contact him in the next 3 weeks to see if he's decided on how he'd like to move forward. Based on our discussion today, he is leaning toward active surveillance.  2. Possible genetic predisposition to malignancy. I've reviewed the patient's family history and I believe he would benefit from consultation. He is in agreement and a referral is going to be made.      Carola Rhine, PAC  and  Sheral Apley Tammi Klippel, M.D.

## 2016-10-16 NOTE — Progress Notes (Signed)
Subjective:     [name], male   DOB: 1946/01/08, 71 y.o.   MRN: [id number]  HPI This 71 year old male returns 1 week post-laser ablation left small saphenous vein for gross reflux with painful varicosities. He previously underwent laser ablation left great saphenous vein for the same problem. He has worn his long leg elastic compression stockings 20-30 millimeter gradient and taken ibuprofen as instructed. He states that the swelling in the leg has improved. He does have residual varicosities anteriorly over the patella and a few medially over the great saphenous vein.  Past Medical History:  Diagnosis Date  . Arthritis   . B12 deficiency   . COPD (chronic obstructive pulmonary disease) ([place]) 01/2011   FVC 47%, FEV1 49%   . 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 Hyperlipidemia   . Hypertension   . Lower extremity edema   . Prostate cancer ([place])   . Secondary pulmonary hypertension 01/2011   RV syst pressure 30-40 mm Hg on ECHO     Social History  Substance Use Topics  . Smoking status: Never Smoker  . Smokeless tobacco: Never Used     Comment: occ alcohol  . Alcohol use 0.0 oz/week     Comment: 2-3 drinks occasionally    Family History  Problem Relation Age of Onset  . CAD Mother        MI in her 48s  . Cancer Father        colon and pancreas  . Epilepsy Son   . Cancer Maternal Grandmother        "stomach"  . Cancer Maternal Grandfather        prostate; died at the age of 60  . Cancer Cousin        paternal first cousin kidney    No Known Allergies   Current Outpatient Prescriptions:  .  albuterol (PROVENTIL HFA;VENTOLIN HFA) 108 (90 BASE) MCG/ACT inhaler, Inhale 2 puffs into the lungs every 6 (six) hours as needed for wheezing or shortness of breath., Disp: , Rfl:  .  aspirin 81 MG tablet, Take 81 mg by mouth daily., Disp: , Rfl:  .  chlorthalidone (HYGROTON) 25 MG tablet, Take 25 mg by mouth daily., Disp: , Rfl:  .  diltiazem  (CARDIZEM CD) 300 MG 24 hr capsule, Take 1 capsule (300 mg total) by mouth daily., Disp: 90 capsule, Rfl: 3 .  glucose blood (ONE TOUCH ULTRA TEST) test strip, CHECK BLOOD SUGAR TWICE DAILY.   DX Code: E11.9, Disp: 100 each, Rfl: 1 .  ibuprofen (ADVIL,MOTRIN) 200 MG tablet, Take 200 mg by mouth every 6 (six) hours as needed., Disp: , Rfl:  .  ONETOUCH DELICA LANCETS FINE MISC, Check blood sugar daily, Disp: 100 each, Rfl: 12 .  tiotropium (SPIRIVA) 18 MCG inhalation capsule, Place 18 mcg into inhaler and inhale daily., Disp: , Rfl:  .  valsartan (DIOVAN) 160 MG tablet, Take 1 tablet (160 mg total) by mouth daily., Disp: 180 tablet, Rfl: 0  Vitals:   10/16/16 1356 10/16/16 1357  BP: (!) 145/78 (!) 143/80  Pulse: (!) 53   Resp: 20   Temp: 98.1 F (36.7 C)   TempSrc: Oral   SpO2: 92%   Weight: (!) 312 lb 9.6 oz (141.8 kg)   Height: 5\' 11"  (1.803 m)     Body mass index is 43.6 kg/m.         Review of Systems Denies chest pain, dyspnea  on exertion, PND, orthopnea, hemoptysis. Has bone-on-bone osteoarthritis and left knee. Needs knee replacement.    Objective:   Physical Exam BP (!) 143/80 Comment: recheck  Pulse (!) 53   Temp 98.1 F (36.7 C) (Oral)   Resp 20   Ht 5\' 11"  (1.803 m)   Wt (!) 312 lb 9.6 oz (141.8 kg)   SpO2 92%   BMI 43.60 kg/m   Gen. obese male no apparent distress alert and oriented 3 Lungs no rhonchi or wheezing Cardiovascular regular rhythm no murmurs Left leg with chronic 1+ edema below the knee. Chronic hyperpigmentation lower half left lower leg which is softer than prior to procedures. Bulging varicosities across patellar area and medial thigh. 2+ dorsalis pedis pulse palpable.  Today I ordered a venous duplex exam the left leg which I reviewed and interpreted. There is no DVT. There is total closure of the left small saphenous vein up to about 1 cm from the saphenofemoral popliteal junction     Assessment:     Successful laser ablation left  great and left small saphenous veins for gross reflux in patient with severe chronic skin changes consisting of hyperpigmentation but no active ulcer    Plan:     Return in 3 months to determine if stab phlebectomy of residual painful varicosities will be indicated Patient will also be getting in touch with Dr. Durward Fortes regarding his left knee replacement and the timing

## 2016-10-16 NOTE — Progress Notes (Signed)
See progress note under physician encounter. 

## 2016-11-02 ENCOUNTER — Telehealth: Payer: Self-pay | Admitting: Family Medicine

## 2016-11-02 NOTE — Telephone Encounter (Signed)
Pt dropped off letter that pt received from [facility] for provider to see and have on pt's chart (3 pages informing important recall information for rx Valsartan 160MG  tablet) Document put at front office tray.

## 2016-11-08 ENCOUNTER — Other Ambulatory Visit: Payer: Self-pay

## 2016-11-08 ENCOUNTER — Telehealth: Payer: Self-pay

## 2016-11-08 MED ORDER — OLMESARTAN MEDOXOMIL 20 MG PO TABS: 20.0000 mg | ORAL_TABLET | Freq: Every day | ORAL | 1 refills | Status: DC

## 2016-11-08 NOTE — Telephone Encounter (Signed)
Spoke with patient about medication Valsartan recall. Per provider change Valsartan to Benicar 20 mg 1 PO qd. Recheck blood pressure 2-3 weeks. Appointment entered. Patient had no questions or concerns at this time. LB

## 2016-11-29 ENCOUNTER — Other Ambulatory Visit: Payer: Self-pay

## 2016-11-29 ENCOUNTER — Ambulatory Visit (INDEPENDENT_AMBULATORY_CARE_PROVIDER_SITE_OTHER): Payer: Medicare Other | Admitting: Internal Medicine

## 2016-11-29 DIAGNOSIS — I10 Essential (primary) hypertension: Secondary | ICD-10-CM | POA: Diagnosis not present

## 2016-11-29 MED ORDER — OLMESARTAN MEDOXOMIL 20 MG PO TABS: 20.0000 mg | ORAL_TABLET | Freq: Every day | ORAL | 0 refills | Status: DC

## 2016-11-29 NOTE — Progress Notes (Addendum)
Pre visit review using our clinic tool,if applicable. No additional management support is needed unless otherwise documented below in the visit note.  Patient in for BP check per order from Dr. Carollee Herter dated 11/08/16. BP medication changed from Valsartan to Benicar 20 mg daily.  No complaints voiced today.  BP = 125/81 P= 54  Per Dr. Larose Kells. Patient to continue taking medication as ordered by Dr. Carollee Herter.  Kathlene November, MD

## 2016-12-13 ENCOUNTER — Telehealth: Payer: Self-pay | Admitting: Radiation Oncology

## 2016-12-13 DIAGNOSIS — C61 Malignant neoplasm of prostate: Secondary | ICD-10-CM

## 2016-12-13 NOTE — Telephone Encounter (Signed)
I spoke with the patient and he is interested in active surveillance he does want to meet with genetics and this hasn't been set up yet. I'll re-order this and ask scheduling to make sure the order is entered properly.

## 2016-12-15 ENCOUNTER — Telehealth: Payer: Self-pay | Admitting: Genetic Counselor

## 2016-12-15 NOTE — Telephone Encounter (Signed)
Genetic counseling appt has been scheduled for the pt to see Roma Kayser on 10/8 at 9am.

## 2016-12-18 ENCOUNTER — Ambulatory Visit (HOSPITAL_BASED_OUTPATIENT_CLINIC_OR_DEPARTMENT_OTHER): Payer: Medicare Other | Admitting: Genetic Counselor

## 2016-12-18 ENCOUNTER — Other Ambulatory Visit: Payer: Medicare Other

## 2016-12-18 ENCOUNTER — Encounter: Payer: Self-pay | Admitting: Genetic Counselor

## 2016-12-18 DIAGNOSIS — Z808 Family history of malignant neoplasm of other organs or systems: Secondary | ICD-10-CM | POA: Diagnosis not present

## 2016-12-18 DIAGNOSIS — Z803 Family history of malignant neoplasm of breast: Secondary | ICD-10-CM

## 2016-12-18 DIAGNOSIS — Z8051 Family history of malignant neoplasm of kidney: Secondary | ICD-10-CM | POA: Diagnosis not present

## 2016-12-18 DIAGNOSIS — C61 Malignant neoplasm of prostate: Secondary | ICD-10-CM | POA: Diagnosis not present

## 2016-12-18 DIAGNOSIS — Z315 Encounter for genetic counseling: Secondary | ICD-10-CM

## 2016-12-18 DIAGNOSIS — Z8042 Family history of malignant neoplasm of prostate: Secondary | ICD-10-CM | POA: Diagnosis not present

## 2016-12-18 DIAGNOSIS — Z800 Family history of malignant neoplasm of digestive organs: Secondary | ICD-10-CM | POA: Insufficient documentation

## 2016-12-18 NOTE — Progress Notes (Signed)
REFERRING PROVIDER: Hayden Pedro, PA-C [place], [place] [address]  PRIMARY PROVIDER:  Carollee Herter, [name], DO  PRIMARY REASON FOR VISIT:  1. Malignant neoplasm of prostate ([place])   2. Family history of breast cancer   3. Family history of colon cancer   4. Family history of pancreatic cancer   5. Family history of renal cancer   6. Family history of prostate cancer      HISTORY OF PRESENT ILLNESS:   Travis Daniels, a 71 y.o. male, was seen for a [place] cancer genetics consultation at the request of Shona Simpson, PA-C due to a personal and family history of cancer.  Mr. Tornow presents to clinic today to discuss the possibility of a hereditary predisposition to cancer, genetic testing, and to further clarify his future cancer risks, as well as potential cancer risks for family members.   In 2018, at the age of 34, [name] was diagnosed with prostate cancer.  The Gleason Score was 6. This is being treated with a wait and see process.  He will be monitored through PSA levels.      CANCER HISTORY:   No history exists.     RISK FACTORS:  Prostate screening:  Yes, will be monitored by PSA. Gleason Score: 6 Radiation exposure:  No Colonoscopy: yes; 5 tubular adenoma's.   Past Medical History:  Diagnosis Date  . Arthritis   . B12 deficiency   . COPD (chronic obstructive pulmonary disease) ([place]) 01/2011   FVC 47%, FEV1 49%   . 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 Family history of breast cancer   . Family history of colon cancer   . Family history of pancreatic cancer   . Family history of prostate cancer   . Family history of renal cancer   . Hyperlipidemia   . Hypertension   . Lower extremity edema   . Prostate cancer ([place])   . Secondary pulmonary hypertension 01/2011   RV syst pressure 30-40 mm Hg on ECHO     Past Surgical History:  Procedure Laterality Date  . COLONOSCOPY W/ POLYPECTOMY  12/19/07   one small polyp,  sigmoid diverticulosis, lipoma rectum, agioectasia/AVM in descending colon (Dr. Ronelle Nigh at [facility] in [place], [place])  . ENDOVENOUS ABLATION SAPHENOUS VEIN W/ LASER Left 09/18/2016   endovenous laser ablation left greater saphenous vein by Tinnie Gens MD   . ENDOVENOUS ABLATION SAPHENOUS VEIN W/ LASER Left 10/09/2016   endovenous laser ablation left small saphenous vein by Tinnie Gens MD   . FINGER SURGERY Right 11   rt middle finger  . PROSTATE BIOPSY    . TOTAL SHOULDER ARTHROPLASTY Right 01/21/2013   Procedure: SHOULDER HEMI ARTHROPLASTY;  Surgeon: Garald Balding, MD;  Location: [facility];  Service: Orthopedics;  Laterality: Right;  . TRANSTHORACIC ECHOCARDIOGRAM  01/2011   EF 55-60%, mild asymmetric LVH, elevated RV systolic pressure  . VASECTOMY      Social History   Social History  . Marital status: Married    Spouse name: N/A  . Number of children: 4  . Years of education: N/A   Occupational History  . retired    Social History Main Topics  . Smoking status: Never Smoker  . Smokeless tobacco: Never Used     Comment: occ alcohol  . Alcohol use 0.0 oz/week     Comment: 2-3 drinks occasionally  . Drug use: No  . Sexual activity: Yes  Other Topics Concern  . Not on file   Social History Narrative   Travis Daniels lives with his wife in [place]. He is recently retired and relocated to [facility] from [place]. Historically, his medical provider has been [facility] in [place]:  We obtained a detailed, 4-generation family history.  Significant diagnoses are listed below: Family History  Problem Relation Age of Onset  . CAD Mother        MI in her 36s  . Skin cancer Mother        multiple skin cancers removed from face and head  . Cancer Father        colon and pancreas  . Epilepsy Son   . Cancer Maternal Grandmother 26       "stomach"  . Cancer Maternal Grandfather        prostate; died at the age of 74  . Cancer Cousin        paternal first cousin  kidney dx under 77  . Breast cancer Cousin        maternal first cousin with breast cancer over 63    The patient has three sons and a daughter who are all cancer free.  He is concerned about his two oldest sons risk for cancer because one works in Therapist, sports and is exposed to chemicals, and the other is a smoker and was in the Army in [place] and [place].  The patient is an only child.  Both parents are deceased.  The patient's father was diagnosed with colon cancer at 63 and pancreatic cancer at 10.  He had a brother and sister who were cancer free, but his sister had a son who had kidney cancer in his 37's.  The paternal grandparents both died in their 81's from non cancer related issues.  The patient's mother had multiple skin cancer, but he is unsure if any of them were melanoma.  She died at 27 from COPD and heart disease.  She had two sisters and four brothers, none had cancer.  One sister had a daughter who had breast cancer over 10.  The maternal grandparents are deceased. His grandfather had prostate cancer and his grandmother died of "stomach" cancer at 40.  [name] is unaware of previous family history of genetic testing for hereditary cancer risks. Patient's maternal ancestors are of [place] and Native American descent, and paternal ancestors are of [place] and [place] descent. There is no reported Ashkenazi Jewish ancestry. There is no known consanguinity.  GENETIC COUNSELING ASSESSMENT: NAHZIR POHLE is a 71 y.o. male with a personal history of prostate cancer and family history of prostate, breast, "stomach", colon, pancreatic and kidney cancer which is somewhat suggestive of a hereditary cancer syndrome and predisposition to cancer. We, therefore, discussed and recommended the following at today's visit.   DISCUSSION: We discussed that about 5-10% of prostate cancer is hereditary with most cases due to BRCA mutations.  Other cases of prostate cancer can be associated with other  breast cancer genes, or with Lynch syndrome.  We discussed that his paternal family history of colon, pancreatic and kidney cancer, along with his prostate cancer and multiple colon polyps is somewhat suggestive of Lynch syndrome.  His paternal cousin had early onset renal cancer, and therefore we will consider the renal cancer panel.  We reviewed the characteristics, features and inheritance patterns of hereditary cancer syndromes. We also discussed genetic testing, including the appropriate family members to test, the  process of testing, insurance coverage and turn-around-time for results. We discussed the implications of a negative, positive and/or variant of uncertain significant result. We recommended Mr. Wesby pursue genetic testing for the common hereditary cancer panel and the renal cancer gene panel. The Hereditary Gene Panel offered by [facility] includes sequencing and/or deletion duplication testing of the following 46 genes: APC, ATM, AXIN2, BARD1, BMPR1A, BRCA1, BRCA2, BRIP1, CDH1, CDKN2A (p14ARF), CDKN2A (p16INK4a), CHEK2, CTNNA1, DICER1, EPCAM (Deletion/duplication testing only), GREM1 (promoter region deletion/duplication testing only), KIT, MEN1, MLH1, MSH2, MSH3, MSH6, MUTYH, NBN, NF1, NHTL1, PALB2, PDGFRA, PMS2, POLD1, POLE, PTEN, RAD50, RAD51C, RAD51D, SDHB, SDHC, SDHD, SMAD4, SMARCA4. STK11, TP53, TSC1, TSC2, and VHL.  The following genes were evaluated for sequence changes only: SDHA and HOXB13 c.251G>A variant only.  The Renal Cancer Gene Panel offered by [facility] includes sequencing and/or deletion duplication testing of the following 18 genes: EPCAM, FH, FLCN, MET, MITF, MLH1, MSH2, MSH6, PMS2, PTEN, SDHA, SDHB, SDHC, SDHD, TP53, TSC1, TSC2, and VHL.    Despite Mr. Hogston personal and family history of cancer, he does not quite meet medical criteria for genetic testing. However, [facility] will still allow him to undergo genetic testing.  We discussed that if his out of pocket cost for  testing is over $100, the laboratory will call and confirm whether he wants to proceed with testing.  If the out of pocket cost of testing is less than $100 he will be billed by the genetic testing laboratory.   PLAN: After considering the risks, benefits, and limitations, Mr. Bogan  provided informed consent to pursue genetic testing and the blood sample was sent to [facility] for analysis of the common hereditary cancer syndrome panel and the renal cancer panel. Results should be available within approximately 2-3 weeks' time, at which point they will be disclosed by telephone to Mr. Prevette, as will any additional recommendations warranted by these results. Mr. Anglin will receive a summary of his genetic counseling visit and a copy of his results once available. This information will also be available in Epic. We encouraged Mr. Oats to remain in contact with cancer genetics annually so that we can continuously update the family history and inform him of any changes in cancer genetics and testing that may be of benefit for his family. Mr. Phillippi questions were answered to his satisfaction today. Our contact information was provided should additional questions or concerns arise.  Lastly, we encouraged Mr. Dimartino to remain in contact with cancer genetics annually so that we can continuously update the family history and inform him of any changes in cancer genetics and testing that may be of benefit for this family.   Mr.  Hedden questions were answered to his satisfaction today. Our contact information was provided should additional questions or concerns arise. Thank you for the referral and allowing [place] to share in the care of your patient.   Karen P. Florene Glen, [place], [facility] Certified Genetic Counselor [email or web address] phone: [phone]  The patient was seen for a total of 452 minutes in face-to-face genetic counseling.  This patient was discussed with Drs. Magrinat,  Lindi Adie and/or Burr Medico who agrees with the above.    _______________________________________________________________________ For Office Staff:  Number of people involved in session: 2 Was an Intern/ student involved with case: no

## 2016-12-26 ENCOUNTER — Encounter: Payer: Self-pay | Admitting: Genetic Counselor

## 2016-12-26 ENCOUNTER — Telehealth: Payer: Self-pay | Admitting: Genetic Counselor

## 2016-12-26 ENCOUNTER — Ambulatory Visit: Payer: Self-pay | Admitting: Genetic Counselor

## 2016-12-26 DIAGNOSIS — Z800 Family history of malignant neoplasm of digestive organs: Secondary | ICD-10-CM

## 2016-12-26 DIAGNOSIS — Z8042 Family history of malignant neoplasm of prostate: Secondary | ICD-10-CM

## 2016-12-26 DIAGNOSIS — Z1379 Encounter for other screening for genetic and chromosomal anomalies: Secondary | ICD-10-CM

## 2016-12-26 DIAGNOSIS — Z803 Family history of malignant neoplasm of breast: Secondary | ICD-10-CM

## 2016-12-26 DIAGNOSIS — C61 Malignant neoplasm of prostate: Secondary | ICD-10-CM

## 2016-12-26 NOTE — Progress Notes (Signed)
HPI: Travis Daniels was previously seen in the [facility] clinic due to a personal and family history of cancer and concerns regarding a hereditary predisposition to cancer. Please refer to our prior cancer genetics clinic note for more information regarding Travis Daniels medical, social and family histories, and our assessment and recommendations, at the time. Travis Daniels recent genetic test results were disclosed to him, as were recommendations warranted by these results. These results and recommendations are discussed in more detail below.  CANCER HISTORY:    Malignant neoplasm of prostate ([place])   10/16/2016 Initial Diagnosis    Malignant neoplasm of prostate ([place])     12/24/2016 Genetic Testing    Negative genetic testing on the common hereditary cancer panel and the urinary tract cancer panel through [facility].  These panels were combined for a total of 57 genes.  The Hereditary Gene Panel offered by [facility] includes sequencing and/or deletion duplication testing of the following 47 genes: APC, ATM, AXIN2, BARD1, BMPR1A, BRCA1, BRCA2, BRIP1, CDH1, CDK4, CDKN2A (p14ARF), CDKN2A (p16INK4a), CHEK2, CTNNA1, DICER1, EPCAM (Deletion/duplication testing only), GREM1 (promoter region deletion/duplication testing only), KIT, MEN1, MLH1, MSH2, MSH3, MSH6, MUTYH, NBN, NF1, NHTL1, PALB2, PDGFRA, PMS2, POLD1, POLE, PTEN, RAD50, RAD51C, RAD51D, SDHB, SDHC, SDHD, SMAD4, SMARCA4. STK11, TP53, TSC1, TSC2, and VHL.  The following genes were evaluated for sequence changes only: SDHA and HOXB13 c.251G>A variant only. The Renal/Urinary Tract cancer panel offered by [facility] includes sequencing and/or deletion/duplication testing of the following 30 genes: BAP1, BUB1B, CDC73, CEP57, CDKN1C, DICER1, DIS3L2,  EPCAM, FH, FLCN, GPC3, MET, MITF, MLH1, MSH2, MSH6, PALB2, PMS2, PTEN, SDHA, SDHB, SDHC, SDHD, SMARCA4, SMARCB1, TP53, TSC1, TSC2, VHL, and WT1.  The report date is December 24, 2016.        FAMILY  HISTORY:  We obtained a detailed, 4-generation family history.  Significant diagnoses are listed below: Family History  Problem Relation Age of Onset  . CAD Mother        MI in her 56s  . Skin cancer Mother        multiple skin cancers removed from face and head  . Cancer Father        colon and pancreas  . Epilepsy Son   . Cancer Maternal Grandmother 53       "stomach"  . Cancer Maternal Grandfather        prostate; died at the age of 38  . Cancer Cousin        paternal first cousin kidney dx under 28  . Breast cancer Cousin        maternal first cousin with breast cancer over 48    The patient has three sons and a daughter who are all cancer free.  He is concerned about his two oldest sons risk for cancer because one works in Therapist, sports and is exposed to chemicals, and the other is a smoker and was in the Army in [place] and [place].  The patient is an only child.  Both parents are deceased.  The patient's father was diagnosed with colon cancer at 29 and pancreatic cancer at 36.  He had a brother and sister who were cancer free, but his sister had a son who had kidney cancer in his 38's.  The paternal grandparents both died in their 81's from non cancer related issues.  The patient's mother had multiple skin cancer, but he is unsure if any of them were melanoma.  She died at 67 from COPD and heart disease.  She had two sisters and four brothers, none had cancer.  One sister had a daughter who had breast cancer over 68.  The maternal grandparents are deceased. His grandfather had prostate cancer and his grandmother died of "stomach" cancer at 42.  Travis Daniels is unaware of previous family history of genetic testing for hereditary cancer risks. Patient's maternal ancestors are of [place] and Native American descent, and paternal ancestors are of [place] and [place] descent. There is no reported Ashkenazi Jewish ancestry. There is no known consanguinity.  GENETIC TEST RESULTS: Genetic  testing reported out on December 24, 2016 through the common hereditary cancer panel and the renal/urinary tract cancer panel found no deleterious mutations.  The two panels were combined for a total of 57 genes.  The Hereditary Gene Panel offered by [facility] includes sequencing and/or deletion duplication testing of the following 46 genes: APC, ATM, AXIN2, BARD1, BMPR1A, BRCA1, BRCA2, BRIP1, CDH1, CDKN2A (p14ARF), CDKN2A (p16INK4a), CHEK2, CTNNA1, DICER1, EPCAM (Deletion/duplication testing only), GREM1 (promoter region deletion/duplication testing only), KIT, MEN1, MLH1, MSH2, MSH3, MSH6, MUTYH, NBN, NF1, NHTL1, PALB2, PDGFRA, PMS2, POLD1, POLE, PTEN, RAD50, RAD51C, RAD51D, SDHB, SDHC, SDHD, SMAD4, SMARCA4. STK11, TP53, TSC1, TSC2, and VHL.  The following genes were evaluated for sequence changes only: SDHA and HOXB13 c.251G>A variant only. The Renal/Urinary Tract cancer panel offered by [facility] includes sequencing and/or deletion/duplication testing of the following 30 genes: BAP1, BUB1B, CDC73, CEP57, CDKN1C, DICER1, DIS3L2,  EPCAM, FH, FLCN, GPC3, MET, MITF, MLH1, MSH2, MSH6, PALB2, PMS2, PTEN, SDHA, SDHB, SDHC, SDHD, SMARCA4, SMARCB1, TP53, TSC1, TSC2, VHL, and WT1. The test report has been scanned into EPIC and is located under the Molecular Pathology section of the Results Review tab.   We discussed with Travis Daniels that since the current genetic testing is not perfect, it is possible there may be a gene mutation in one of these genes that current testing cannot detect, but that chance is small. We also discussed, that it is possible that another gene that has not yet been discovered, or that we have not yet tested, is responsible for the cancer diagnoses in the family, and it is, therefore, important to remain in touch with cancer genetics in the future so that we can continue to offer Travis Daniels the most up to date genetic testing.      CANCER SCREENING RECOMMENDATIONS: This result is reassuring  and indicates that Travis Daniels likely does not have an increased risk for a future cancer due to a mutation in one of these genes. This normal test also suggests that Travis Daniels cancer was most likely not due to an inherited predisposition associated with one of these genes.  Most cancers happen by chance and this negative test suggests that his cancer falls into this category.  We, therefore, recommended he continue to follow the cancer management and screening guidelines provided by his oncology and primary healthcare provider.   RECOMMENDATIONS FOR FAMILY MEMBERS: Individuals in this family might be at some increased risk of developing cancer, over the general population risk, simply due to the family history of cancer. We recommended women in this family have a yearly mammogram beginning at age 57, or 33 years younger than the earliest onset of cancer, an annual clinical breast exam, and perform monthly breast self-exams. Women in this family should also have a gynecological exam as recommended by their primary provider. All family members should have a colonoscopy by age 33.  FOLLOW-UP: Lastly, we discussed with Travis Daniels that cancer genetics  is a rapidly advancing field and it is possible that new genetic tests will be appropriate for him and/or his family members in the future. We encouraged him to remain in contact with cancer genetics on an annual basis so we can update his personal and family histories and let him know of advances in cancer genetics that may benefit this family.   Our contact number was provided. Travis Daniels questions were answered to his satisfaction, and he knows he is welcome to call us at anytime with additional questions or concerns.   Roma Kayser, MS, [facility] Certified Genetic Counselor [email or web address]

## 2016-12-26 NOTE — Telephone Encounter (Signed)
Revealed negative genetic testing.  Discussed that we do not know why he has prostate cancer or why there is cancer in the family. It could be due to a different gene that we are not testing, or maybe our current technology may not be able to pick something up.  It will be important for him to keep in contact with genetics to keep up with whether additional testing may be needed.   

## 2016-12-27 DIAGNOSIS — Z23 Encounter for immunization: Secondary | ICD-10-CM | POA: Diagnosis not present

## 2016-12-27 DIAGNOSIS — I10 Essential (primary) hypertension: Secondary | ICD-10-CM | POA: Diagnosis not present

## 2016-12-27 DIAGNOSIS — E1129 Type 2 diabetes mellitus with other diabetic kidney complication: Secondary | ICD-10-CM | POA: Diagnosis not present

## 2016-12-27 DIAGNOSIS — N183 Chronic kidney disease, stage 3 (moderate): Secondary | ICD-10-CM | POA: Diagnosis not present

## 2017-01-16 ENCOUNTER — Ambulatory Visit (INDEPENDENT_AMBULATORY_CARE_PROVIDER_SITE_OTHER): Payer: Medicare Other | Admitting: Vascular Surgery

## 2017-01-16 ENCOUNTER — Encounter: Payer: Self-pay | Admitting: Vascular Surgery

## 2017-01-16 VITALS — BP 132/77 | HR 66 | Temp 98.1°F | Resp 18 | Ht 71.0 in | Wt 312.7 lb

## 2017-01-16 DIAGNOSIS — I83893 Varicose veins of bilateral lower extremities with other complications: Secondary | ICD-10-CM

## 2017-01-16 NOTE — Progress Notes (Signed)
Subjective:     [name], male   DOB: [birth year], 71 y.o.   MRN: [id number]  HPI This 71 year old male returns 3 months post-laser ablation left great and left small saphenous veins for gross reflux with painful varicosities. He has noticed improvement in his distal edema. He is wearing short leg elastic compression stockings. He needs left knee replacement but needs to have the procedures for his. He also needs to lose some weight. He is having some symptoms in the right knee but not as bad.  Past Medical History:  Diagnosis Date  . Arthritis   . B12 deficiency   . COPD (chronic obstructive pulmonary disease) ([place]) 01/2011   FVC 47%, FEV1 49%   . 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 Family history of breast cancer   . Family history of colon cancer   . Family history of pancreatic cancer   . Family history of prostate cancer   . Family history of renal cancer   . Hyperlipidemia   . Hypertension   . Lower extremity edema   . Prostate cancer ([place])   . Secondary pulmonary hypertension 01/2011   RV syst pressure 30-40 mm Hg on ECHO     Social History   Tobacco Use  . Smoking status: Never Smoker  . Smokeless tobacco: Never Used  . Tobacco comment: occ alcohol  Substance Use Topics  . Alcohol use: Yes    Alcohol/week: 0.0 oz    Comment: 2-3 drinks occasionally    Family History  Problem Relation Age of Onset  . CAD Mother        MI in her 2s  . Skin cancer Mother        multiple skin cancers removed from face and head  . Cancer Father        colon and pancreas  . Epilepsy Son   . Cancer Maternal Grandmother 28       "stomach"  . Cancer Maternal Grandfather        prostate; died at the age of 13  . Cancer Cousin        paternal first cousin kidney dx under 25  . Breast cancer Cousin        maternal first cousin with breast cancer over 50    No Known Allergies   Current Outpatient Medications:  .  albuterol (PROVENTIL  HFA;VENTOLIN HFA) 108 (90 BASE) MCG/ACT inhaler, Inhale 2 puffs into the lungs every 6 (six) hours as needed for wheezing or shortness of breath., Disp: , Rfl:  .  aspirin 81 MG tablet, Take 81 mg by mouth daily., Disp: , Rfl:  .  chlorthalidone (HYGROTON) 25 MG tablet, Take 25 mg by mouth daily., Disp: , Rfl:  .  diltiazem (CARDIZEM CD) 300 MG 24 hr capsule, Take 1 capsule (300 mg total) by mouth daily., Disp: 90 capsule, Rfl: 3 .  glucose blood (ONE TOUCH ULTRA TEST) test strip, CHECK BLOOD SUGAR TWICE DAILY.   DX Code: E11.9, Disp: 100 each, Rfl: 1 .  ibuprofen (ADVIL,MOTRIN) 200 MG tablet, Take 200 mg by mouth every 6 (six) hours as needed., Disp: , Rfl:  .  olmesartan (BENICAR) 20 MG tablet, Take 1 tablet (20 mg total) by mouth daily., Disp: 90 tablet, Rfl: 0 .  ONETOUCH DELICA LANCETS FINE MISC, Check blood sugar daily, Disp: 100 each, Rfl: 12 .  tiotropium (SPIRIVA) 18 MCG inhalation capsule, Place 18 mcg into inhaler and inhale  daily., Disp: , Rfl:   Vitals:   01/16/17 1336  BP: 132/77  Pulse: 66  Resp: 18  Temp: 98.1 F (36.7 C)  TempSrc: Oral  SpO2: 94%  Weight: (!) 312 lb 11.2 oz (141.8 kg)  Height: 5\' 11"  (1.803 m)    Body mass index is 43.61 kg/m.         Review of Systems  Has chronic obesity. Also severe osteoarthritis left worse than right knee. Denies chest pain, dyspnea on exertion, PND, orthopnea, hemoptysis    Objective:   Physical Exam BP 132/77 (BP Location: Right Arm, Patient Position: Sitting, Cuff Size: Large)   Pulse 66   Temp 98.1 F (36.7 C) (Oral)   Resp 18   Ht 5\' 11"  (1.803 m)   Wt (!) 312 lb 11.2 oz (141.8 kg)   SpO2 94%   BMI 43.61 kg/m   Gen. obese male no apparent distress alert and oriented 3 Lungs no rhonchi or wheezing Cardiovascular regular rhythm no murmurs Left leg with bulging varicosities beginning in the distal thigh across the patella into the pretibial region. Also one in the medial mid to distal thigh. 1+ distal edema.  3+ dorsalis pedis pulse palpable.     Assessment:     Status post laser ablation left great and small saphenous veins for gross reflux with painful varicosities left knee Severe osteoarthritis left knee needs total joint replacement Chronic morbid obesity    Plan:     Patient needs multiple stab phlebectomy of painful varicosities left knee-10-20 to be done as a single procedure in the near future We will proceed with precertification to perform that and then he can proceed with further evaluation for possible left knee replacement

## 2017-02-09 ENCOUNTER — Telehealth: Payer: Self-pay | Admitting: Medical

## 2017-02-09 ENCOUNTER — Encounter: Payer: Self-pay | Admitting: Medical

## 2017-02-09 ENCOUNTER — Ambulatory Visit (INDEPENDENT_AMBULATORY_CARE_PROVIDER_SITE_OTHER): Payer: Medicare Other | Admitting: Medical

## 2017-02-09 VITALS — BP 119/67 | HR 65 | Temp 98.5°F | Resp 16 | Wt 305.8 lb

## 2017-02-09 DIAGNOSIS — Z8739 Personal history of other diseases of the musculoskeletal system and connective tissue: Secondary | ICD-10-CM

## 2017-02-09 DIAGNOSIS — M25572 Pain in left ankle and joints of left foot: Secondary | ICD-10-CM

## 2017-02-09 DIAGNOSIS — L03116 Cellulitis of left lower limb: Secondary | ICD-10-CM | POA: Diagnosis not present

## 2017-02-09 DIAGNOSIS — R944 Abnormal results of kidney function studies: Secondary | ICD-10-CM | POA: Diagnosis not present

## 2017-02-09 LAB — COMPREHENSIVE METABOLIC PANEL
ALK PHOS: 76 U/L (ref 39–117)
ALT: 13 U/L (ref 0–53)
AST: 17 U/L (ref 0–37)
Albumin: 3.9 g/dL (ref 3.5–5.2)
BILIRUBIN TOTAL: 1 mg/dL (ref 0.2–1.2)
BUN: 23 mg/dL (ref 6–23)
CALCIUM: 9.8 mg/dL (ref 8.4–10.5)
CO2: 35 mEq/L — ABNORMAL HIGH (ref 19–32)
Chloride: 98 mEq/L (ref 96–112)
Creatinine, Ser: 1.6 mg/dL — ABNORMAL HIGH (ref 0.40–1.50)
GFR: 45.41 mL/min — AB (ref >60.00)
GLUCOSE: 88 mg/dL (ref 70–99)
POTASSIUM: 3.5 meq/L (ref 3.5–5.1)
Sodium: 139 mEq/L (ref 135–145)
TOTAL PROTEIN: 7.4 g/dL (ref 6.0–8.3)

## 2017-02-09 LAB — CBC WITH DIFFERENTIAL/PLATELET
BASOS ABS: 0.1 10*3/uL (ref 0.0–0.1)
Basophils Relative: 1.1 % (ref 0.0–3.0)
EOS PCT: 1.6 % (ref 0.0–5.0)
Eosinophils Absolute: 0.2 10*3/uL (ref 0.0–0.7)
HEMATOCRIT: 45.5 % (ref 39.0–52.0)
Hemoglobin: 15 g/dL (ref 13.0–17.0)
LYMPHS ABS: 1.7 10*3/uL (ref 0.7–4.0)
LYMPHS PCT: 15.1 % (ref 12.0–46.0)
MCHC: 33 g/dL (ref 30.0–36.0)
MCV: 86.8 fl (ref 78.0–100.0)
MONOS PCT: 8.6 % (ref 3.0–12.0)
Monocytes Absolute: 1 10*3/uL (ref 0.1–1.0)
NEUTROS ABS: 8.3 10*3/uL — AB (ref 1.4–7.7)
NEUTROS PCT: 73.6 % (ref 43.0–77.0)
PLATELETS: 304 10*3/uL (ref 150.0–400.0)
RBC: 5.24 Mil/uL (ref 4.22–5.81)
RDW: 13.6 % (ref 11.5–15.5)
WBC: 11.3 10*3/uL — ABNORMAL HIGH (ref 4.0–10.5)

## 2017-02-09 LAB — URIC ACID: Uric Acid, Serum: 11 mg/dL — ABNORMAL HIGH (ref 4.0–7.8)

## 2017-02-09 MED ORDER — CEFTRIAXONE SODIUM 1 G IJ SOLR
1.0000 g | Freq: Once | INTRAMUSCULAR | Status: AC
  Administered 2017-02-09: 1 g via INTRAMUSCULAR

## 2017-02-09 MED ORDER — CLINDAMYCIN HCL 300 MG PO CAPS: 300.0000 mg | ORAL_CAPSULE | Freq: Three times a day (TID) | ORAL | 0 refills | Status: DC

## 2017-02-09 NOTE — Telephone Encounter (Signed)
The attempted deletion regarding flu vaccine I believe was for another patient.  So I tried to delete it but it looks like it has not been removed from epic.

## 2017-02-09 NOTE — Progress Notes (Signed)
Subjective:    [name], male    DOB: 12/29/1945, 71 y.o.   MRN: [id number]  HPI  Pt in with recent lt ankle and distal 1/3 pretibial tenderness and warmth. Pt states 4 days ago pain started. He has history of recurrent cellulitis in the past. In summer admitted for sepsis. He has been doing well until this week.  Pt has not been running fever, not fatigued or lethargic.   Pt sugars this morning was 88-130 recently. This am 102.  No upper calf pain. No popliteal pain. No sob.   Pt has gout in past. He ran out of allopurinol in summer. Given this in past.     Review of Systems  Constitutional: Negative for chills, fatigue and fever.  Respiratory: Negative for cough, chest tightness, shortness of breath and wheezing.   Cardiovascular: Negative for chest pain and palpitations.  Gastrointestinal: Negative for abdominal pain.  Musculoskeletal: Negative for back pain.       See hpi.  Ankle and distal 1/3 pretibial pain, faint redness and warmth.  Neurological: Negative for dizziness, seizures, speech difficulty, weakness, light-headedness and headaches.  Hematological: Negative for adenopathy. Does not bruise/bleed easily.  Psychiatric/Behavioral: Negative for behavioral problems and confusion.   Past Medical History:  Diagnosis Date  . Arthritis   . B12 deficiency   . COPD (chronic obstructive pulmonary disease) ([place]) 01/2011   FVC 47%, FEV1 49%   . 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 Family history of breast cancer   . Family history of colon cancer   . Family history of pancreatic cancer   . Family history of prostate cancer   . Family history of renal cancer   . Hyperlipidemia   . Hypertension   . Lower extremity edema   . Prostate cancer ([place])   . Secondary pulmonary hypertension 01/2011   RV syst pressure 30-40 mm Hg on ECHO      Social History   Socioeconomic History  . Marital status: Married    Spouse name: Not on file    . Number of children: 4  . Years of education: Not on file  . Highest education level: Not on file  Social Needs  . Financial resource strain: Not on file  . Food insecurity - worry: Not on file  . Food insecurity - inability: Not on file  . Transportation needs - medical: Not on file  . Transportation needs - non-medical: Not on file  Occupational History  . Occupation: retired  Tobacco Use  . Smoking status: Never Smoker  . Smokeless tobacco: Never Used  . Tobacco comment: occ alcohol  Substance and Sexual Activity  . Alcohol use: Yes    Alcohol/week: 0.0 oz    Comment: 2-3 drinks occasionally  . Drug use: No  . Sexual activity: Yes  Other Topics Concern  . Not on file  Social History Narrative   Mr. Klumpp lives with his wife in [place]. He is recently retired and relocated to [facility] from [place]. Historically, his medical provider has been [facility] in [place].    Past Surgical History:  Procedure Laterality Date  . 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 Ronelle Nigh at [facility] in [place], [place])  . ENDOVENOUS ABLATION SAPHENOUS VEIN W/ LASER Left 09/18/2016   endovenous laser ablation left greater saphenous vein by Tinnie Gens MD   .  ENDOVENOUS ABLATION SAPHENOUS VEIN W/ LASER Left 10/09/2016   endovenous laser ablation left small saphenous vein by Tinnie Gens MD   . FINGER SURGERY Right 11   rt middle finger  . PROSTATE BIOPSY    . TOTAL SHOULDER ARTHROPLASTY Right 01/21/2013   Procedure: SHOULDER HEMI ARTHROPLASTY;  Surgeon: Garald Balding, MD;  Location: [facility];  Service: Orthopedics;  Laterality: Right;  . TRANSTHORACIC ECHOCARDIOGRAM  01/2011   EF 55-60%, mild asymmetric LVH, elevated RV systolic pressure  . VASECTOMY      Family History  Problem Relation Age of Onset  . CAD Mother        MI in her 76s  . Skin cancer Mother        multiple skin cancers removed from  face and head  . Cancer Father        colon and pancreas  . Epilepsy Son   . Cancer Maternal Grandmother 102       "stomach"  . Cancer Maternal Grandfather        prostate; died at the age of 71  . Cancer Cousin        paternal first cousin kidney dx under 41  . Breast cancer Cousin        maternal first cousin with breast cancer over 59    No Known Allergies  Current Outpatient Medications on File Prior to Visit  Medication Sig Dispense Refill  . albuterol (PROVENTIL HFA;VENTOLIN HFA) 108 (90 BASE) MCG/ACT inhaler Inhale 2 puffs into the lungs every 6 (six) hours as needed for wheezing or shortness of breath.    Marland Kitchen aspirin 81 MG tablet Take 81 mg by mouth daily.    . chlorthalidone (HYGROTON) 25 MG tablet Take 25 mg by mouth daily.    Marland Kitchen diltiazem (CARDIZEM CD) 300 MG 24 hr capsule Take 1 capsule (300 mg total) by mouth daily. 90 capsule 3  . glucose blood (ONE TOUCH ULTRA TEST) test strip CHECK BLOOD SUGAR TWICE DAILY.   DX Code: E11.9 100 each 1  . ibuprofen (ADVIL,MOTRIN) 200 MG tablet Take 200 mg by mouth every 6 (six) hours as needed.    Marland Kitchen olmesartan (BENICAR) 20 MG tablet Take 1 tablet (20 mg total) by mouth daily. 90 tablet 0  . ONETOUCH DELICA LANCETS FINE MISC Check blood sugar daily 100 each 12  . tiotropium (SPIRIVA) 18 MCG inhalation capsule Place 18 mcg into inhaler and inhale daily.     No current facility-administered medications on file prior to visit.     BP 119/67   Pulse 65   Temp 98.5 F (36.9 C) (Oral)   Resp 16   Wt (!) 305 lb 12.8 oz (138.7 kg)   SpO2 96%   BMI 42.65 kg/m       Objective:   Physical Exam  General Mental Status- Alert. General Appearance- Not in acute distress.    Chest and Lung Exam Auscultation: Breath Sounds:-Normal.  Cardiovascular Auscultation:Rythm- Regular. Murmurs & Other Heart Sounds:Auscultation of the heart reveals- No Murmurs.    Neurologic Cranial Nerve exam:- CN III-XII intact(No nystagmus), symmetric  smile. Strength:- 5/5 equal and symmetric strength both upper and lower extremities.  Skin-left lower extremity from ankle to distal one third area of tibia has moderate warmth and tenderness to palpation.  Faint pink appearance around the ankle and some hyperpigmented skin as well.  Left lower extremity with- negative Homans sign.  Left calf appears symmetric compared to the right side.  Left foot-  upon inspection no lesions or ulcer seen.     Assessment & Plan:  For your history of recurrent cellulitis of lower extremities and recent symptoms, I do think it is best to go ahead and give you 1 g Rocephin today and prescribed clindamycin.  Reminder that probiotics would be beneficial while using antibiotics.  If over the weekend you have worsening signs and symptoms as discussed then be seen in the emergency department.  We will get CBC and uric acid today.  You do have history of gout but I think your presentation is more infectious presently.  When labs are back might add colchicine to treatment regimen.  Follow-up Monday at 1 PM or as needed.  Jaxyn Rout, Percell Miller, PA-C

## 2017-02-09 NOTE — Patient Instructions (Addendum)
For your history of recurrent cellulitis of lower extremities and recent symptoms, I do think it is best to go ahead and give you 1 g Rocephin today and prescribed clindamycin.  Reminder that probiotics would be beneficial while using antibiotics.  If over the weekend you have worsening signs and symptoms as discussed then be seen in the emergency department.  We will get CBC, cmp and uric acid today.  You do have history of gout but I think your presentation is more infectious presently.  When labs are back might add colchicine to treatment regimen.  Follow-up Monday at 1 PM or as needed.

## 2017-02-09 NOTE — Telephone Encounter (Deleted)
Patient came in for flu vaccine and I have designated a no charge.  Then signed off on the note.   Only did this because it would not let me sign the encounter.  Most of the time I just signed off on the encounter when Santiago Glad or others give the injection.  I did not see him officially for office visit.  Not sure how to charge for flu vaccine?  Do you?

## 2017-02-10 ENCOUNTER — Telehealth: Payer: Self-pay | Admitting: Medical

## 2017-02-10 MED ORDER — ALLOPURINOL 100 MG PO TABS: 100.0000 mg | ORAL_TABLET | Freq: Every day | ORAL | 6 refills | Status: DC

## 2017-02-10 NOTE — Telephone Encounter (Signed)
Opened to review 

## 2017-02-10 NOTE — Telephone Encounter (Signed)
Opened to send allopurinol in.

## 2017-02-12 ENCOUNTER — Ambulatory Visit: Payer: Medicare Other | Admitting: Family Medicine

## 2017-02-12 ENCOUNTER — Ambulatory Visit (INDEPENDENT_AMBULATORY_CARE_PROVIDER_SITE_OTHER): Payer: Medicare Other | Admitting: Medical

## 2017-02-12 ENCOUNTER — Encounter: Payer: Self-pay | Admitting: Medical

## 2017-02-12 VITALS — BP 126/68 | HR 74 | Temp 98.9°F | Resp 16 | Ht 72.0 in | Wt 305.8 lb

## 2017-02-12 DIAGNOSIS — M25562 Pain in left knee: Secondary | ICD-10-CM

## 2017-02-12 DIAGNOSIS — L03116 Cellulitis of left lower limb: Secondary | ICD-10-CM

## 2017-02-12 DIAGNOSIS — Z8739 Personal history of other diseases of the musculoskeletal system and connective tissue: Secondary | ICD-10-CM

## 2017-02-12 NOTE — Progress Notes (Signed)
Subjective:    [name], male    DOB: April 13, 1945, 71 y.o.   MRN: [id number]  HPI  Pt in for follow up.  Left ankle has some improved. WBC count was mild elevated. Pt left knee pain since last visit has been hurting. Pt gait has changes since ankle pain started. Pt recently using walker. Known djd and told needs knee replacement.  No popliteal pain.  Orthopedist told him he needs to lose about 30 more pounds before he can get surgery for the knee.  Pt last hospitalized in summer he had severe swelling of leg. Much worse clinical presentation in the past than now on review. Pt has known degenerative changes. Xray showed  "Films of the left knee were obtained in 3 projections standing. There is  complete collapse of the medial compartment with approximately 3-4 of  varus. There are peripheral osteophytes and subchondral sclerosis of the  medial femur and tibial plateau. There are degenerative changes at the  patellofemoral joint and lateral compartment as well."    Review of Systems  Constitutional: Negative for chills, fatigue and fever.  Respiratory: Negative for cough, chest tightness, shortness of breath and wheezing.   Cardiovascular: Negative for chest pain and palpitations.  Gastrointestinal: Negative for abdominal pain.  Musculoskeletal: Negative for back pain.       Left knee pain. Less ankle pain.  Skin: Negative for rash.  Neurological: Negative for dizziness and headaches.  Hematological: Negative for adenopathy. Does not bruise/bleed easily.  Psychiatric/Behavioral: Negative for behavioral problems, confusion and sleep disturbance. The patient is not nervous/anxious.     Past Medical History:  Diagnosis Date  . Arthritis   . B12 deficiency   . COPD (chronic obstructive pulmonary disease) ([place]) 01/2011   FVC 47%, FEV1 49%   . 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 Family history of breast cancer   . Family history of colon cancer    . Family history of pancreatic cancer   . Family history of prostate cancer   . Family history of renal cancer   . Hyperlipidemia   . Hypertension   . Lower extremity edema   . Prostate cancer ([place])   . Secondary pulmonary hypertension 01/2011   RV syst pressure 30-40 mm Hg on ECHO      Social History   Socioeconomic History  . Marital status: Married    Spouse name: Not on file  . Number of children: 4  . Years of education: Not on file  . Highest education level: Not on file  Social Needs  . Financial resource strain: Not on file  . Food insecurity - worry: Not on file  . Food insecurity - inability: Not on file  . Transportation needs - medical: Not on file  . Transportation needs - non-medical: Not on file  Occupational History  . Occupation: retired  Tobacco Use  . Smoking status: Never Smoker  . Smokeless tobacco: Never Used  . Tobacco comment: occ alcohol  Substance and Sexual Activity  . Alcohol use: Yes    Alcohol/week: 0.0 oz    Comment: 2-3 drinks occasionally  . Drug use: No  . Sexual activity: Yes  Other Topics Concern  . Not on file  Social History Narrative   Mr. Nephew lives with his wife in [place]. He is recently retired and relocated to [facility] from [place]. Historically, his medical provider has been [facility] in [place].  Past Surgical History:  Procedure Laterality Date  . 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 Ronelle Nigh at [facility] in [place], [place])  . ENDOVENOUS ABLATION SAPHENOUS VEIN W/ LASER Left 09/18/2016   endovenous laser ablation left greater saphenous vein by Tinnie Gens MD   . ENDOVENOUS ABLATION SAPHENOUS VEIN W/ LASER Left 10/09/2016   endovenous laser ablation left small saphenous vein by Tinnie Gens MD   . FINGER SURGERY Right 11   rt middle finger  . PROSTATE BIOPSY    . TOTAL SHOULDER ARTHROPLASTY Right 01/21/2013   Procedure:  SHOULDER HEMI ARTHROPLASTY;  Surgeon: Garald Balding, MD;  Location: [facility];  Service: Orthopedics;  Laterality: Right;  . TRANSTHORACIC ECHOCARDIOGRAM  01/2011   EF 55-60%, mild asymmetric LVH, elevated RV systolic pressure  . VASECTOMY      Family History  Problem Relation Age of Onset  . CAD Mother        MI in her 11s  . Skin cancer Mother        multiple skin cancers removed from face and head  . Cancer Father        colon and pancreas  . Epilepsy Son   . Cancer Maternal Grandmother 48       "stomach"  . Cancer Maternal Grandfather        prostate; died at the age of 42  . Cancer Cousin        paternal first cousin kidney dx under 41  . Breast cancer Cousin        maternal first cousin with breast cancer over 33    No Known Allergies  Current Outpatient Medications on File Prior to Visit  Medication Sig Dispense Refill  . albuterol (PROVENTIL HFA;VENTOLIN HFA) 108 (90 BASE) MCG/ACT inhaler Inhale 2 puffs into the lungs every 6 (six) hours as needed for wheezing or shortness of breath.    . allopurinol (ZYLOPRIM) 100 MG tablet Take 1 tablet (100 mg total) by mouth daily. 30 tablet 6  . aspirin 81 MG tablet Take 81 mg by mouth daily.    . chlorthalidone (HYGROTON) 25 MG tablet Take 25 mg by mouth daily.    . clindamycin (CLEOCIN) 300 MG capsule Take 1 capsule (300 mg total) by mouth 3 (three) times daily. 21 capsule 0  . diltiazem (CARDIZEM CD) 300 MG 24 hr capsule Take 1 capsule (300 mg total) by mouth daily. 90 capsule 3  . glucose blood (ONE TOUCH ULTRA TEST) test strip CHECK BLOOD SUGAR TWICE DAILY.   DX Code: E11.9 100 each 1  . ibuprofen (ADVIL,MOTRIN) 200 MG tablet Take 200 mg by mouth every 6 (six) hours as needed.    Marland Kitchen olmesartan (BENICAR) 20 MG tablet Take 1 tablet (20 mg total) by mouth daily. 90 tablet 0  . ONETOUCH DELICA LANCETS FINE MISC Check blood sugar daily 100 each 12  . tiotropium (SPIRIVA) 18 MCG inhalation capsule Place 18 mcg into inhaler and inhale  daily.     No current facility-administered medications on file prior to visit.     BP 126/68   Pulse 74   Temp 98.9 F (37.2 C) (Oral)   Resp 16   Ht 6' (1.829 m)   Wt (!) 305 lb 12.8 oz (138.7 kg)   SpO2 96%   BMI 41.47 kg/m       Objective:   Physical Exam  General Mental Status- Alert.  General Appearance- Not in acute distress.    Chest and Lung Exam Auscultation: Breath Sounds:-Normal.  Cardiovascular Auscultation:Rythm- Regular. Murmurs & Other Heart Sounds:Auscultation of the heart reveals- No Murmurs.    Neurologic Cranial Nerve exam:- CN III-XII intact(No nystagmus), symmetric smile. Strength:- 5/5 equal and symmetric strength both upper and lower extremities.  Skin-left lower extremity from ankle to distal one third area of tibia no longer has warmth and tenderness to palpation.    The pink appearance around the ankle has resolved.  However he still has hyperpigmented skin.  Left lower extremity with- negative Homans sign.  Left calf appears symmetric compared to the right side.  Left foot- upon inspection no lesions or ulcer seen.  Knee - tender medial aspect/tibial plateau region.. No warmth. No crepitus. Negative homans signs.     Assessment & Plan:  Your left ankle/lower extremity cellulitis by exam looks much improved.  I would continue the clindamycin prescription.  If the area becomes red warm or tender again please let me know.  In that event we would repeat a CBC.  Your WBC was very minimally elevated and by exam do look significantly improved so we will  Not repeat labs today.  Since your left ankle tenderness responded to the antibiotics, I do not think you had gout flare recently.  However would continue to take allopurinol daily.  In the future if we suspect gout flare could use brief short course of colchicine.  Would do so with caution in light of your diltiazem use.  Your left knee pain recently occurred with gait change as  results of ankle cellulitis.  Also you have known severe degenerative joint disease of left knee.  If pain worsens let us know.  Particularly if you need popliteal region pain occurs then me know as I would order lower extremity ultrasound.  Do not think this is necessary presently.  For potential weight loss, recommend diet and exercise if possible.  Swimming would be a good option.  If you do decide seeing weight loss specialist will be helpful but just let me know by my chart and I would make that referral.  Follow-up in 7 days or as needed. Sydelle Sherfield, Percell Miller, PA-C

## 2017-02-12 NOTE — Patient Instructions (Addendum)
Your left ankle/lower extremity cellulitis by exam looks much improved.  I would continue the clindamycin prescription.  If the area becomes red warm or tender again please let me know.  In that event we would repeat a CBC.  Your WBC was very minimally elevated and by exam do look significantly improved so we will  Not repeat labs today.  Since your left ankle tenderness responded to the antibiotics, I do not think you had gout flare recently.  However would continue to take allopurinol daily.  In the future if we suspect gout flare could use brief short course of colchicine.  Would do so with caution in light of your diltiazem use.  Your left knee pain recently occurred with gait change as results of ankle cellulitis.  Also you have known severe degenerative joint disease of left knee.  If pain worsens let us know.  Particularly if you need popliteal region pain occurs then me know as I would order lower extremity ultrasound.  Do not think this is necessary presently.  For potential weight loss, recommend diet and exercise if possible.  Swimming would be a good option.  If you do decide seeing weight loss specialist will be helpful but just let me know by my chart and I would make that referral.  Follow-up in 7 days or as needed.

## 2017-02-15 ENCOUNTER — Ambulatory Visit (INDEPENDENT_AMBULATORY_CARE_PROVIDER_SITE_OTHER): Payer: Medicare Other | Admitting: Family Medicine

## 2017-02-15 ENCOUNTER — Emergency Department (HOSPITAL_BASED_OUTPATIENT_CLINIC_OR_DEPARTMENT_OTHER): Payer: Medicare Other

## 2017-02-15 ENCOUNTER — Other Ambulatory Visit: Payer: Self-pay

## 2017-02-15 ENCOUNTER — Inpatient Hospital Stay (HOSPITAL_BASED_OUTPATIENT_CLINIC_OR_DEPARTMENT_OTHER)
Admission: EM | Admit: 2017-02-15 | Discharge: 2017-02-19 | DRG: 603 | Disposition: A | Discharge status: Home / Self Care | Payer: Medicare Other | Attending: Internal Medicine | Admitting: Internal Medicine

## 2017-02-15 ENCOUNTER — Encounter (HOSPITAL_BASED_OUTPATIENT_CLINIC_OR_DEPARTMENT_OTHER): Payer: Self-pay

## 2017-02-15 ENCOUNTER — Inpatient Hospital Stay (HOSPITAL_COMMUNITY): Payer: Medicare Other

## 2017-02-15 ENCOUNTER — Encounter: Payer: Self-pay | Admitting: Family Medicine

## 2017-02-15 DIAGNOSIS — L03115 Cellulitis of right lower limb: Secondary | ICD-10-CM | POA: Diagnosis not present

## 2017-02-15 DIAGNOSIS — Z7189 Other specified counseling: Secondary | ICD-10-CM

## 2017-02-15 DIAGNOSIS — M170 Bilateral primary osteoarthritis of knee: Secondary | ICD-10-CM | POA: Diagnosis present

## 2017-02-15 DIAGNOSIS — E876 Hypokalemia: Secondary | ICD-10-CM | POA: Diagnosis present

## 2017-02-15 DIAGNOSIS — T380X5A Adverse effect of glucocorticoids and synthetic analogues, initial encounter: Secondary | ICD-10-CM | POA: Diagnosis not present

## 2017-02-15 DIAGNOSIS — M21162 Varus deformity, not elsewhere classified, left knee: Secondary | ICD-10-CM | POA: Diagnosis present

## 2017-02-15 DIAGNOSIS — R791 Abnormal coagulation profile: Secondary | ICD-10-CM | POA: Diagnosis present

## 2017-02-15 DIAGNOSIS — L03119 Cellulitis of unspecified part of limb: Secondary | ICD-10-CM | POA: Diagnosis not present

## 2017-02-15 DIAGNOSIS — R0600 Dyspnea, unspecified: Secondary | ICD-10-CM | POA: Diagnosis not present

## 2017-02-15 DIAGNOSIS — L03116 Cellulitis of left lower limb: Secondary | ICD-10-CM

## 2017-02-15 DIAGNOSIS — Z9989 Dependence on other enabling machines and devices: Secondary | ICD-10-CM

## 2017-02-15 DIAGNOSIS — J449 Chronic obstructive pulmonary disease, unspecified: Secondary | ICD-10-CM | POA: Diagnosis present

## 2017-02-15 DIAGNOSIS — M7989 Other specified soft tissue disorders: Secondary | ICD-10-CM | POA: Diagnosis not present

## 2017-02-15 DIAGNOSIS — N289 Disorder of kidney and ureter, unspecified: Secondary | ICD-10-CM

## 2017-02-15 DIAGNOSIS — I2729 Other secondary pulmonary hypertension: Secondary | ICD-10-CM | POA: Diagnosis present

## 2017-02-15 DIAGNOSIS — Z792 Long term (current) use of antibiotics: Secondary | ICD-10-CM

## 2017-02-15 DIAGNOSIS — E1122 Type 2 diabetes mellitus with diabetic chronic kidney disease: Secondary | ICD-10-CM

## 2017-02-15 DIAGNOSIS — Z8546 Personal history of malignant neoplasm of prostate: Secondary | ICD-10-CM

## 2017-02-15 DIAGNOSIS — E669 Obesity, unspecified: Secondary | ICD-10-CM | POA: Diagnosis present

## 2017-02-15 DIAGNOSIS — N183 Chronic kidney disease, stage 3 (moderate): Secondary | ICD-10-CM | POA: Diagnosis present

## 2017-02-15 DIAGNOSIS — R000 Tachycardia, unspecified: Secondary | ICD-10-CM | POA: Diagnosis present

## 2017-02-15 DIAGNOSIS — I872 Venous insufficiency (chronic) (peripheral): Secondary | ICD-10-CM | POA: Diagnosis present

## 2017-02-15 DIAGNOSIS — E538 Deficiency of other specified B group vitamins: Secondary | ICD-10-CM | POA: Diagnosis present

## 2017-02-15 DIAGNOSIS — E119 Type 2 diabetes mellitus without complications: Secondary | ICD-10-CM | POA: Diagnosis not present

## 2017-02-15 DIAGNOSIS — I878 Other specified disorders of veins: Secondary | ICD-10-CM | POA: Diagnosis present

## 2017-02-15 DIAGNOSIS — R7982 Elevated C-reactive protein (CRP): Secondary | ICD-10-CM | POA: Diagnosis present

## 2017-02-15 DIAGNOSIS — Z8672 Personal history of thrombophlebitis: Secondary | ICD-10-CM

## 2017-02-15 DIAGNOSIS — C61 Malignant neoplasm of prostate: Secondary | ICD-10-CM | POA: Diagnosis present

## 2017-02-15 DIAGNOSIS — Z6841 Body Mass Index (BMI) 40.0 and over, adult: Secondary | ICD-10-CM

## 2017-02-15 DIAGNOSIS — E1165 Type 2 diabetes mellitus with hyperglycemia: Secondary | ICD-10-CM | POA: Diagnosis present

## 2017-02-15 DIAGNOSIS — G4733 Obstructive sleep apnea (adult) (pediatric): Secondary | ICD-10-CM | POA: Diagnosis present

## 2017-02-15 DIAGNOSIS — Z96611 Presence of right artificial shoulder joint: Secondary | ICD-10-CM | POA: Diagnosis present

## 2017-02-15 DIAGNOSIS — L0390 Cellulitis, unspecified: Secondary | ICD-10-CM | POA: Diagnosis present

## 2017-02-15 DIAGNOSIS — R509 Fever, unspecified: Secondary | ICD-10-CM | POA: Diagnosis not present

## 2017-02-15 DIAGNOSIS — Y92230 Patient room in hospital as the place of occurrence of the external cause: Secondary | ICD-10-CM | POA: Diagnosis not present

## 2017-02-15 DIAGNOSIS — I129 Hypertensive chronic kidney disease with stage 1 through stage 4 chronic kidney disease, or unspecified chronic kidney disease: Secondary | ICD-10-CM | POA: Diagnosis present

## 2017-02-15 DIAGNOSIS — B999 Unspecified infectious disease: Secondary | ICD-10-CM | POA: Diagnosis not present

## 2017-02-15 DIAGNOSIS — J181 Lobar pneumonia, unspecified organism: Secondary | ICD-10-CM | POA: Diagnosis not present

## 2017-02-15 DIAGNOSIS — Z7982 Long term (current) use of aspirin: Secondary | ICD-10-CM

## 2017-02-15 DIAGNOSIS — M109 Gout, unspecified: Secondary | ICD-10-CM | POA: Diagnosis not present

## 2017-02-15 DIAGNOSIS — M25462 Effusion, left knee: Secondary | ICD-10-CM | POA: Diagnosis present

## 2017-02-15 DIAGNOSIS — Z79899 Other long term (current) drug therapy: Secondary | ICD-10-CM

## 2017-02-15 LAB — BASIC METABOLIC PANEL
ANION GAP: 8 (ref 5–15)
BUN: 24 mg/dL — ABNORMAL HIGH (ref 6–20)
CALCIUM: 9.4 mg/dL (ref 8.9–10.3)
CO2: 31 mmol/L (ref 22–32)
Chloride: 97 mmol/L — ABNORMAL LOW (ref 101–111)
Creatinine, Ser: 1.55 mg/dL — ABNORMAL HIGH (ref 0.61–1.24)
GFR, EST AFRICAN AMERICAN: 50 mL/min — AB (ref >60)
GFR, EST NON AFRICAN AMERICAN: 43 mL/min — AB (ref >60)
GLUCOSE: 101 mg/dL — AB (ref 65–99)
Potassium: 3.3 mmol/L — ABNORMAL LOW (ref 3.5–5.1)
Sodium: 136 mmol/L (ref 135–145)

## 2017-02-15 LAB — CBC WITH DIFFERENTIAL/PLATELET
BASOS ABS: 0 10*3/uL (ref 0.0–0.1)
BASOS PCT: 0 %
EOS ABS: 0 10*3/uL (ref 0.0–0.7)
EOS PCT: 0 %
HCT: 43.4 % (ref 39.0–52.0)
Hemoglobin: 14.6 g/dL (ref 13.0–17.0)
LYMPHS ABS: 1.3 10*3/uL (ref 0.7–4.0)
LYMPHS PCT: 10 %
MCH: 28.2 pg (ref 26.0–34.0)
MCHC: 33.6 g/dL (ref 30.0–36.0)
MCV: 83.8 fL (ref 78.0–100.0)
MONO ABS: 1.3 10*3/uL — AB (ref 0.1–1.0)
MONOS PCT: 10 %
NEUTROS ABS: 9.9 10*3/uL — AB (ref 1.7–7.7)
Neutrophils Relative %: 80 %
PLATELETS: 303 10*3/uL (ref 150–400)
RBC: 5.18 MIL/uL (ref 4.22–5.81)
RDW: 12.6 % (ref 11.5–15.5)
WBC: 12.5 10*3/uL — ABNORMAL HIGH (ref 4.0–10.5)

## 2017-02-15 LAB — I-STAT CG4 LACTIC ACID, ED: LACTIC ACID, VENOUS: 1.85 mmol/L (ref 0.5–1.9)

## 2017-02-15 MED ORDER — ALBUTEROL SULFATE (2.5 MG/3ML) 0.083% IN NEBU: 3.0000 mL | INHALATION_SOLUTION | Freq: Four times a day (QID) | RESPIRATORY_TRACT | Status: DC | PRN

## 2017-02-15 MED ORDER — CEFAZOLIN SODIUM-DEXTROSE 2-4 GM/100ML-% IV SOLN
2.0000 g | Freq: Three times a day (TID) | INTRAVENOUS | Status: DC
  Administered 2017-02-16 – 2017-02-19 (×11): 2 g via INTRAVENOUS
  Filled 2017-02-15 (×12): qty 100

## 2017-02-15 MED ORDER — TIOTROPIUM BROMIDE MONOHYDRATE 18 MCG IN CAPS
18.0000 ug | ORAL_CAPSULE | Freq: Every day | RESPIRATORY_TRACT | Status: DC
  Administered 2017-02-16 – 2017-02-19 (×4): 18 ug via RESPIRATORY_TRACT
  Filled 2017-02-15: qty 5

## 2017-02-15 MED ORDER — ALLOPURINOL 100 MG PO TABS
100.0000 mg | ORAL_TABLET | Freq: Every day | ORAL | Status: DC
  Administered 2017-02-16 – 2017-02-19 (×4): 100 mg via ORAL
  Filled 2017-02-15 (×4): qty 1

## 2017-02-15 MED ORDER — ASPIRIN EC 81 MG PO TBEC
81.0000 mg | DELAYED_RELEASE_TABLET | Freq: Every day | ORAL | Status: DC
  Administered 2017-02-16 – 2017-02-19 (×4): 81 mg via ORAL
  Filled 2017-02-15 (×4): qty 1

## 2017-02-15 MED ORDER — IRBESARTAN 150 MG PO TABS
75.0000 mg | ORAL_TABLET | Freq: Every day | ORAL | Status: DC
Start: 2017-02-16 — End: 2017-02-19
  Administered 2017-02-16 – 2017-02-19 (×4): 75 mg via ORAL
  Filled 2017-02-15 (×4): qty 1

## 2017-02-15 MED ORDER — COLCHICINE 0.6 MG PO TABS
0.6000 mg | ORAL_TABLET | Freq: Every day | ORAL | Status: DC
  Administered 2017-02-16 – 2017-02-19 (×4): 0.6 mg via ORAL
  Filled 2017-02-15 (×4): qty 1

## 2017-02-15 MED ORDER — ACETAMINOPHEN 325 MG PO TABS
650.0000 mg | ORAL_TABLET | Freq: Once | ORAL | Status: AC
  Administered 2017-02-15: 650 mg via ORAL
  Filled 2017-02-15: qty 2

## 2017-02-15 MED ORDER — ENOXAPARIN SODIUM 40 MG/0.4ML ~~LOC~~ SOLN
40.0000 mg | SUBCUTANEOUS | Status: DC
  Administered 2017-02-16: 40 mg via SUBCUTANEOUS
  Filled 2017-02-15: qty 0.4

## 2017-02-15 MED ORDER — PREDNISONE 20 MG PO TABS
60.0000 mg | ORAL_TABLET | Freq: Every day | ORAL | Status: DC
  Administered 2017-02-16 – 2017-02-19 (×4): 60 mg via ORAL
  Filled 2017-02-15 (×4): qty 3

## 2017-02-15 MED ORDER — DILTIAZEM HCL ER COATED BEADS 180 MG PO CP24
300.0000 mg | ORAL_CAPSULE | Freq: Every day | ORAL | Status: DC
  Administered 2017-02-16 – 2017-02-19 (×4): 300 mg via ORAL
  Filled 2017-02-15 (×4): qty 1

## 2017-02-15 MED ORDER — CEFAZOLIN SODIUM-DEXTROSE 1-4 GM/50ML-% IV SOLN
1.0000 g | Freq: Once | INTRAVENOUS | Status: AC
  Administered 2017-02-15: 1 g via INTRAVENOUS
  Filled 2017-02-15: qty 50

## 2017-02-15 MED ORDER — SODIUM CHLORIDE 0.9 % IV SOLN
INTRAVENOUS | Status: AC
  Administered 2017-02-16 (×2): via INTRAVENOUS

## 2017-02-15 NOTE — ED Provider Notes (Signed)
[facility] EMERGENCY DEPARTMENT Provider Note  CSN: [id number] Arrival date & time: 02/15/17 1213  Chief Complaint(s) Cellulitis  HPI Travis Daniels is a 71 y.o. male with an extensive past medical history listed below including venous stasis and prior lower extremity cellulitis.  He presents here for 2 weeks of left lower extremity cellulitis which his PCP has been managing with outpatient antibiotics, that have not been successful in treating it.  Patient reports that over the last several days swelling in his leg and erythema have worsened.  Patient was seen by the PCP today and sent here for failed outpatient management.  Patient noted that last night he began having chills but no recorded fevers.  He denies any nausea or vomiting.  No chest pain or shortness of breath.  No abdominal pain or diarrhea.  No urinary symptoms.  No alleviating or aggravating factors.  No other physical complaints.  HPI  Past Medical History Past Medical History:  Diagnosis Date  . Arthritis   . B12 deficiency   . COPD (chronic obstructive pulmonary disease) ([place]) 01/2011   FVC 47%, FEV1 49%   . 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 Family history of breast cancer   . Family history of colon cancer   . Family history of pancreatic cancer   . Family history of prostate cancer   . Family history of renal cancer   . Hyperlipidemia   . Hypertension   . Lower extremity edema   . Prostate cancer ([place])   . Secondary pulmonary hypertension 01/2011   RV syst pressure 30-40 mm Hg on ECHO    Patient Active Problem List   Diagnosis Date Noted  . Genetic testing 12/26/2016  . Family history of breast cancer   . Family history of colon cancer   . Family history of pancreatic cancer   . Family history of renal cancer   . Family history of prostate cancer   . Malignant neoplasm of prostate ([place]) 10/16/2016  . Hypokalemia 09/03/2016  . Loose stools 09/03/2016  . Elevated PSA  09/01/2016  . Hypophosphatemia 09/01/2016  . H/O prostate biopsy 08/31/2016  . Cellulitis of leg, right 08/31/2016  . Cellulitis 08/31/2016  . Gout attack 08/31/2016  . Acute renal failure superimposed on chronic kidney disease ([place]) 08/31/2016  . Hematuria 08/31/2016  . Hyperlipidemia 08/31/2016  . Sepsis ([place]) 08/31/2016  . Varicose veins of bilateral lower extremities with other complications 40/98/1191  . Tendonitis, Achilles, right 03/25/2014  . Pain in joint, ankle and foot 03/23/2014  . Cellulitis of left lower leg 01/20/2014  . Diabetes mellitus ([place]) 07/04/2013  . Severe obesity (BMI >= 40) ([place]) 07/03/2013  . OSA (obstructive sleep apnea) 01/23/2013  . Hypersomnolence 01/23/2013  . Osteoarthritis of shoulder 01/23/2013  . Obesity, Class III, BMI 40-49.9 (morbid obesity) ([place]) 01/23/2013  . Acute respiratory failure with hypoxia ([place]) 01/21/2013  . COPD (chronic obstructive pulmonary disease) ([employer]) 01/21/2013  . Hypertension 01/21/2013  . Chest pain 10/30/2012  . Dyspnea 10/30/2012  . Preop cardiovascular exam 10/30/2012  . Venous stasis dermatitis 09/17/2012  . Essential hypertension, benign 09/10/2012  . Peripheral vascular disease ([place]) 09/10/2012  . Pain in joint, shoulder region 09/10/2012   Home Medication(s) Prior to Admission medications   Medication Sig Start Date End Date Taking? Authorizing Provider  albuterol (PROVENTIL HFA;VENTOLIN HFA) 108 (90 BASE) MCG/ACT inhaler Inhale 2 puffs into the lungs every 6 (six) hours as needed for wheezing or shortness  of breath.    [provider]  allopurinol (ZYLOPRIM) 100 MG tablet Take 1 tablet (100 mg total) by mouth daily. 02/10/17   Saguier, Percell Miller, PA-C  aspirin 81 MG tablet Take 81 mg by mouth daily.    [provider]  chlorthalidone (HYGROTON) 25 MG tablet Take 25 mg by mouth daily.    [provider]  clindamycin (CLEOCIN) 300 MG capsule Take 1 capsule (300 mg total) by mouth 3 (three)  times daily. 02/09/17   Saguier, Percell Miller, PA-C  diltiazem (CARDIZEM CD) 300 MG 24 hr capsule Take 1 capsule (300 mg total) by mouth daily. 07/13/14   Roma Schanz R, DO  glucose blood (ONE TOUCH ULTRA TEST) test strip CHECK BLOOD SUGAR TWICE DAILY.   DX Code: E11.9 10/04/16   Carollee Herter, [name], DO  ibuprofen (ADVIL,MOTRIN) 200 MG tablet Take 200 mg by mouth every 6 (six) hours as needed.    [provider]  olmesartan (BENICAR) 20 MG tablet Take 1 tablet (20 mg total) by mouth daily. 11/29/16   Carollee Herter, [name], DO  ONETOUCH DELICA LANCETS FINE MISC Check blood sugar daily 07/03/13   Carollee Herter, Kendrick Fries R, DO  tiotropium (SPIRIVA) 18 MCG inhalation capsule Place 18 mcg into inhaler and inhale daily.    [provider]                                                                                                                                    Past Surgical History Past Surgical History:  Procedure Laterality Date  . 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 Ronelle Nigh at [facility] in [place], [place])  . ENDOVENOUS ABLATION SAPHENOUS VEIN W/ LASER Left 09/18/2016   endovenous laser ablation left greater saphenous vein by Tinnie Gens MD   . ENDOVENOUS ABLATION SAPHENOUS VEIN W/ LASER Left 10/09/2016   endovenous laser ablation left small saphenous vein by Tinnie Gens MD   . FINGER SURGERY Right 11   rt middle finger  . PROSTATE BIOPSY    . TOTAL SHOULDER ARTHROPLASTY Right 01/21/2013   Procedure: SHOULDER HEMI ARTHROPLASTY;  Surgeon: Garald Balding, MD;  Location: [facility];  Service: Orthopedics;  Laterality: Right;  . TRANSTHORACIC ECHOCARDIOGRAM  01/2011   EF 55-60%, mild asymmetric LVH, elevated RV systolic pressure  . VASECTOMY     Family History Family History  Problem Relation Age of Onset  . CAD Mother        MI in her 60s  . Skin cancer Mother         multiple skin cancers removed from face and head  . Cancer Father        colon and pancreas  . Epilepsy Son   . Cancer Maternal Grandmother 37       "stomach"  . Cancer Maternal Grandfather  prostate; died at the age of 81  . Cancer Cousin        paternal first cousin kidney dx under 80  . Breast cancer Cousin        maternal first cousin with breast cancer over 43    Social History Social History   Tobacco Use  . Smoking status: Never Smoker  . Smokeless tobacco: Never Used  . Tobacco comment: occ alcohol  Substance Use Topics  . Alcohol use: Yes    Alcohol/week: 0.0 oz    Comment: 2-3 drinks occasionally  . Drug use: No   Allergies Patient has no known allergies.  Review of Systems Review of Systems All other systems are reviewed and are negative for acute change except as noted in the HPI  Physical Exam Vital Signs  I have reviewed the triage vital signs BP (!) 157/94 (BP Location: Right Arm)   Pulse (!) 102   Temp (!) 100.7 F (38.2 C) (Oral)   Resp 18   Ht 5\' 11"  (1.803 m)   Wt (!) 138.3 kg (305 lb)   SpO2 95%   BMI 42.54 kg/m   Physical Exam  Constitutional: He is oriented to person, place, and time. He appears well-developed and well-nourished. No distress.  HENT:  Head: Normocephalic and atraumatic.  Nose: Nose normal.  Eyes: Conjunctivae and EOM are normal. Pupils are equal, round, and reactive to light. Right eye exhibits no discharge. Left eye exhibits no discharge. No scleral icterus.  Neck: Normal range of motion. Neck supple.  Cardiovascular: Normal rate and regular rhythm. Exam reveals no gallop and no friction rub.  No murmur heard. Pulmonary/Chest: Effort normal and breath sounds normal. No stridor. No respiratory distress. He has no rales.  Abdominal: Soft. He exhibits no distension. There is no tenderness.  Musculoskeletal: He exhibits no edema or tenderness.  Left lower extremity with 3+ pitting edema.  Patient has mild erythema to  the medial aspect of the left ankle, dorsum of the foot, and third toe.  Neurological: He is alert and oriented to person, place, and time.  Skin: Skin is warm and dry. No rash noted. He is not diaphoretic. No erythema.  Psychiatric: He has a normal mood and affect.  Vitals reviewed.   ED Results and Treatments Labs (all labs ordered are listed, but only abnormal results are displayed) Labs Reviewed  CBC WITH DIFFERENTIAL/PLATELET - Abnormal; Notable for the following components:      Result Value   WBC 12.5 (*)    All other components within normal limits  BASIC METABOLIC PANEL - Abnormal; Notable for the following components:   Potassium 3.3 (*)    Chloride 97 (*)    Glucose, Bld 101 (*)    BUN 24 (*)    Creatinine, Ser 1.55 (*)    GFR calc non Af Amer 43 (*)    GFR calc Af Amer 50 (*)    All other components within normal limits  CBC WITH DIFFERENTIAL/PLATELET  I-STAT CG4 LACTIC ACID, ED  EKG  EKG Interpretation  Date/Time:    Ventricular Rate:    PR Interval:    QRS Duration:   QT Interval:    QTC Calculation:   R Axis:     Text Interpretation:        Radiology US Venous Img Lower Unilateral Left  Result Date: 02/15/2017 CLINICAL DATA:  Left lower extremity swelling. EXAM: Left LOWER EXTREMITY VENOUS DOPPLER ULTRASOUND TECHNIQUE: Gray-scale sonography with graded compression, as well as color Doppler and duplex ultrasound were performed to evaluate the lower extremity deep venous systems from the level of the common femoral vein and including the common femoral, femoral, profunda femoral, popliteal and calf veins including the posterior tibial, peroneal and gastrocnemius veins when visible. The superficial great saphenous vein was also interrogated. Spectral Doppler was utilized to evaluate flow at rest and with distal augmentation maneuvers in the  common femoral, femoral and popliteal veins. COMPARISON:  None. FINDINGS: Contralateral Common Femoral Vein: Respiratory phasicity is normal and symmetric with the symptomatic side. No evidence of thrombus. Normal compressibility. Common Femoral Vein: No evidence of thrombus. Normal compressibility, respiratory phasicity and response to augmentation. Saphenofemoral Junction: No evidence of thrombus. Normal compressibility and flow on color Doppler imaging. Profunda Femoral Vein: No evidence of thrombus. Normal compressibility and flow on color Doppler imaging. Femoral Vein: No evidence of thrombus. Normal compressibility, respiratory phasicity and response to augmentation. Popliteal Vein: No evidence of thrombus. Normal compressibility, respiratory phasicity and response to augmentation. Calf Veins: Peroneal vein is not visualized. No definite thrombus is noted in posterior tibial vein. Superficial Great Saphenous Vein: Greater saphenous vein and small saphenous vein appear to be thrombosed which may be due to prior ablation. Venous Reflux:  None. Other Findings:  Multiple superficial varicosities are noted. IMPRESSION: No evidence of deep venous thrombosis seen in left lower extremity. Left greater and small saphenous veins appear to be thrombosed which may be due to prior ablation. Electronically Signed   By: Marijo Conception, M.D.   On: 02/15/2017 14:20   Pertinent labs & imaging results that were available during my care of the patient were reviewed by me and considered in my medical decision making (see chart for details).  Medications Ordered in ED Medications  ceFAZolin (ANCEF) IVPB 1 g/50 mL premix (1 g Intravenous New Bag/Given 02/15/17 1554)  acetaminophen (TYLENOL) tablet 650 mg (650 mg Oral Given 02/15/17 1544)                                                                                                                                    Procedures Procedures  (including critical care  time)  Medical Decision Making / ED Course I have reviewed the nursing notes for this encounter and the patient's prior records (if available in EHR or on provided paperwork).    Patient is febrile and mildly tachycardic but normotensive.  Lactic acid within normal limits.  Leukocytosis.  Patient  started on empiric antibiotics.  Will admit for failed outpatient management.  Final Clinical Impression(s) / ED Diagnoses Final diagnoses:  Cellulitis of left lower extremity      This chart was dictated using voice recognition software.  Despite best efforts to proofread,  errors can occur which can change the documentation meaning.   Fatima Blank, MD 02/15/17 [phone]

## 2017-02-15 NOTE — Patient Instructions (Signed)
Pt sent to er

## 2017-02-15 NOTE — Progress Notes (Signed)
Pharmacy Antibiotic Note  Travis Daniels is a 71 y.o. male admitted on 02/15/2017 with cellulitis.  Pharmacy has been consulted for Ancef dosing. WBC 12.5. Lactic acid mildly elevated. Normalized Creatinine Clearance ~45.   Plan: Ancef 2g IV q8h Trend WBC, temp, renal function   Height: 5\' 11"  (180.3 cm) Weight: (!) 305 lb (138.3 kg) IBW/kg (Calculated) : 75.3  Temp (24hrs), Avg:100 F (37.8 C), Min:99.4 F (37.4 C), Max:100.7 F (38.2 C)  Recent Labs  Lab 02/09/17 1213 02/15/17 1316 02/15/17 1526  WBC 11.3*  --  12.5*  CREATININE 1.60*  --  1.55*  LATICACIDVEN  --  1.85  --     Estimated Creatinine Clearance: 62.1 mL/min (A) (by C-G formula based on SCr of 1.55 mg/dL (H)).    No Known Allergies   Narda Bonds 02/15/2017 11:39 PM

## 2017-02-15 NOTE — ED Notes (Signed)
IV attempted x 2 by Carl Best. EDP notified of need for [place].

## 2017-02-15 NOTE — ED Notes (Signed)
Attempted to call report x 1  

## 2017-02-15 NOTE — ED Notes (Signed)
ED Provider at bedside. 

## 2017-02-15 NOTE — Progress Notes (Signed)
[name], male   DOB: [birth year], 71 y.o.   MRN: [id number]     Subjective:  I acted as a Education administrator for Dr. Carollee Herter.  [name], [place]   [name], male    DOB: 1945-09-29, 71 y.o.   MRN: [id number]  Chief Complaint  Patient presents with  . left foot pain    HPI  Patient is in today for follow up cellulitis of lower left leg.  Still is in a lot of pain.  Now has calf swelling.  Left foot and ankle is now more swollen. + chills-- he is taking the clinda and allopurinol.  Symptoms have significantly worsened since las ov-- he states his knee and foot are 2x the size they were.  Pt had to be taken back in wheelchair.   Patient Care Team: Carollee Herter, [name], DO as PCP - General (Family Medicine) Donato Heinz, MD as Consulting Physician (Nephrology)   Past Medical History:  Diagnosis Date  . Arthritis   . B12 deficiency   . COPD (chronic obstructive pulmonary disease) ([place]) 01/2011   FVC 47%, FEV1 49%   . 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 Family history of breast cancer   . Family history of colon cancer   . Family history of pancreatic cancer   . Family history of prostate cancer   . Family history of renal cancer   . Hyperlipidemia   . Hypertension   . Lower extremity edema   . Prostate cancer ([place])   . Secondary pulmonary hypertension 01/2011   RV syst pressure 30-40 mm Hg on ECHO     Past Surgical History:  Procedure Laterality Date  . 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 Ronelle Nigh at [facility] in [place], [place])  . ENDOVENOUS ABLATION SAPHENOUS VEIN W/ LASER Left 09/18/2016   endovenous laser ablation left greater saphenous vein by Tinnie Gens MD   . ENDOVENOUS ABLATION SAPHENOUS VEIN W/ LASER Left 10/09/2016   endovenous laser ablation left small saphenous vein by Tinnie Gens MD   . FINGER SURGERY Right  11   rt middle finger  . PROSTATE BIOPSY    . TOTAL SHOULDER ARTHROPLASTY Right 01/21/2013   Procedure: SHOULDER HEMI ARTHROPLASTY;  Surgeon: Garald Balding, MD;  Location: [facility];  Service: Orthopedics;  Laterality: Right;  . TRANSTHORACIC ECHOCARDIOGRAM  01/2011   EF 55-60%, mild asymmetric LVH, elevated RV systolic pressure  . VASECTOMY      Family History  Problem Relation Age of Onset  . CAD Mother        MI in her 8s  . Skin cancer Mother        multiple skin cancers removed from face and head  . Cancer Father        colon and pancreas  . Epilepsy Son   . Cancer Maternal Grandmother 72       "stomach"  . Cancer Maternal Grandfather        prostate; died at the age of 25  . Cancer Cousin        paternal first cousin kidney dx under 21  . Breast cancer Cousin        maternal first cousin with breast cancer over 100    Social History   Socioeconomic History  . Marital status: Married    Spouse name: Not on file  .  Number of children: 4  . Years of education: Not on file  . Highest education level: Not on file  Social Needs  . Financial resource strain: Not on file  . Food insecurity - worry: Not on file  . Food insecurity - inability: Not on file  . Transportation needs - medical: Not on file  . Transportation needs - non-medical: Not on file  Occupational History  . Occupation: retired  Tobacco Use  . Smoking status: Never Smoker  . Smokeless tobacco: Never Used  . Tobacco comment: occ alcohol  Substance and Sexual Activity  . Alcohol use: Yes    Alcohol/week: 0.0 oz    Comment: 2-3 drinks occasionally  . Drug use: No  . Sexual activity: Yes  Other Topics Concern  . Not on file  Social History Narrative   Mr. Navarrete lives with his wife in [place]. He is recently retired and relocated to [place] from [place]. Historically, his medical provider has been [facility] in [place].    Outpatient Medications Prior to Visit  Medication Sig Dispense Refill  . albuterol  (PROVENTIL HFA;VENTOLIN HFA) 108 (90 BASE) MCG/ACT inhaler Inhale 2 puffs into the lungs every 6 (six) hours as needed for wheezing or shortness of breath.    . allopurinol (ZYLOPRIM) 100 MG tablet Take 1 tablet (100 mg total) by mouth daily. 30 tablet 6  . aspirin 81 MG tablet Take 81 mg by mouth daily.    . chlorthalidone (HYGROTON) 25 MG tablet Take 25 mg by mouth daily.    . clindamycin (CLEOCIN) 300 MG capsule Take 1 capsule (300 mg total) by mouth 3 (three) times daily. 21 capsule 0  . diltiazem (CARDIZEM CD) 300 MG 24 hr capsule Take 1 capsule (300 mg total) by mouth daily. 90 capsule 3  . glucose blood (ONE TOUCH ULTRA TEST) test strip CHECK BLOOD SUGAR TWICE DAILY.   DX Code: E11.9 100 each 1  . ibuprofen (ADVIL,MOTRIN) 200 MG tablet Take 200 mg by mouth every 6 (six) hours as needed.    Marland Kitchen olmesartan (BENICAR) 20 MG tablet Take 1 tablet (20 mg total) by mouth daily. 90 tablet 0  . ONETOUCH DELICA LANCETS FINE MISC Check blood sugar daily 100 each 12  . tiotropium (SPIRIVA) 18 MCG inhalation capsule Place 18 mcg into inhaler and inhale daily.     No facility-administered medications prior to visit.     No Known Allergies  Review of Systems  Constitutional: Positive for chills. Negative for fever and malaise/fatigue.  HENT: Negative for congestion.   Eyes: Negative for blurred vision.  Respiratory: Negative for cough and shortness of breath.   Cardiovascular: Positive for leg swelling. Negative for chest pain and palpitations.  Gastrointestinal: Negative for vomiting.  Musculoskeletal: Negative for back pain.       Left leg and foot pain  Skin: Negative for rash.  Neurological: Negative for loss of consciousness and headaches.       Objective:    Physical Exam  Constitutional: He has a sickly appearance.  Musculoskeletal: He exhibits edema and tenderness.  Skin: There is erythema. There is pallor.     Nursing note and vitals reviewed.   BP 130/84 (BP Location: Right  Arm, Cuff Size: Normal)   Pulse 90   Temp 99.4 F (37.4 C) (Oral)   Resp 16   SpO2 94%  Wt Readings from Last 3 Encounters:  02/15/17 (!) 305 lb (138.3 kg)  02/12/17 (!) 305 lb 12.8 oz (138.7 kg)  02/09/17 (!) 305 lb 12.8 oz (138.7 kg)   BP Readings from Last 3 Encounters:  02/15/17 128/86  02/15/17 130/84  02/12/17 126/68     Immunization History  Administered Date(s) Administered  . Influenza Split 11/11/2012, 12/11/2012  . Influenza, High Dose Seasonal PF 12/03/2014  . Influenza,inj,Quad PF,6+ Mos 11/21/2013  . Influenza-Unspecified 01/05/2016  . Pneumococcal Conjugate-13 11/12/2011  . Pneumococcal Polysaccharide-23 09/30/2012  . Tdap 03/25/2013  . Zoster 10/01/2012    Health Maintenance  Topic Date Due  . FOOT EXAM  05/05/2015  . OPHTHALMOLOGY EXAM  08/10/2016  . INFLUENZA VACCINE  10/11/2016  . HEMOGLOBIN A1C  03/03/2017  . TETANUS/TDAP  03/26/2023  . COLONOSCOPY  11/24/2024  . Hepatitis C Screening  Completed  . PNA vac Low Risk Adult  Completed    Lab Results  Component Value Date   WBC 11.3 (H) 02/09/2017   HGB 15.0 02/09/2017   HCT 45.5 02/09/2017   PLT 304.0 02/09/2017   GLUCOSE 88 02/09/2017   CHOL 123 09/02/2016   TRIG 67 09/02/2016   HDL 45 09/02/2016   LDLCALC 65 09/02/2016   ALT 13 02/09/2017   AST 17 02/09/2017   NA 139 02/09/2017   K 3.5 02/09/2017   CL 98 02/09/2017   CREATININE 1.60 (H) 02/09/2017   BUN 23 02/09/2017   CO2 35 (H) 02/09/2017   TSH 0.736 09/01/2016   PSA 4.96 (H) 02/26/2015   INR 1.23 09/01/2016   HGBA1C 5.2 09/01/2016   MICROALBUR 69.1 02/26/2015    Lab Results  Component Value Date   TSH 0.736 09/01/2016   Lab Results  Component Value Date   WBC 11.3 (H) 02/09/2017   HGB 15.0 02/09/2017   HCT 45.5 02/09/2017   MCV 86.8 02/09/2017   PLT 304.0 02/09/2017   Lab Results  Component Value Date   NA 139 02/09/2017   K 3.5 02/09/2017   CO2 35 (H) 02/09/2017   GLUCOSE 88 02/09/2017   BUN 23 02/09/2017    CREATININE 1.60 (H) 02/09/2017   BILITOT 1.0 02/09/2017   ALKPHOS 76 02/09/2017   AST 17 02/09/2017   ALT 13 02/09/2017   PROT 7.4 02/09/2017   ALBUMIN 3.9 02/09/2017   CALCIUM 9.8 02/09/2017   ANIONGAP 10 09/04/2016   GFR 45.41 (L) 02/09/2017   Lab Results  Component Value Date   CHOL 123 09/02/2016   Lab Results  Component Value Date   HDL 45 09/02/2016   Lab Results  Component Value Date   LDLCALC 65 09/02/2016   Lab Results  Component Value Date   TRIG 67 09/02/2016   Lab Results  Component Value Date   CHOLHDL 2.7 09/02/2016   Lab Results  Component Value Date   HGBA1C 5.2 09/01/2016         Assessment & Plan:   Problem List Items Addressed This Visit      Unprioritized   Cellulitis of left lower leg    Pt is on clindamycin and was given rocephin last ov With worsening symptoms / pain-- will send pt to ER for further evaluation and possible admission D/w ER          I am having Susette Racer maintain his aspirin, albuterol, ONETOUCH DELICA LANCETS FINE, diltiazem, chlorthalidone, tiotropium, glucose blood, ibuprofen, olmesartan, clindamycin, and allopurinol.  No orders of the defined types were placed in this encounter.   CMA served as Education administrator during this visit. History, Physical and Plan performed by medical provider. Documentation and orders  reviewed and attested to.  Ann Held, DO

## 2017-02-15 NOTE — Assessment & Plan Note (Signed)
Pt is on clindamycin and was given rocephin last ov With worsening symptoms / pain-- will send pt to ER for further evaluation and possible admission D/w ER

## 2017-02-15 NOTE — ED Triage Notes (Signed)
C/o cellulitis left LE-sent from PCP office via w/c

## 2017-02-15 NOTE — H&P (Signed)
TRH H&P   Patient Demographics:    Travis Daniels, is a 71 y.o. male  MRN: [id number]   DOB - [birth year]  Admit Date - 02/15/2017  Outpatient Primary MD for the patient is Carollee Herter, [name], DO  Referring MD/NP/PA: Dr. Leonette Monarch  Outpatient Specialists:   Patient coming from: home=> [facility]  Chief Complaint  Patient presents with  . Cellulitis      HPI:    Travis Daniels  is a 71 y.o. male, w prostate cancer, Copd, pulm hypertension, b12 deficiency who apparently c/o swelling of the left ankle starting last Wednesday.  Pt notes severe pain in the ankle as well, no hx of trauma. And also now the left knee.  There is redness of the left distal lower ext as well.  Pt denies cp, palp, sob, n/v, diarrhea, brbpr, black stool.    In ED,  Wbc 12.5, Hgb 14.6, Plt 303  Na 136, K 3.3, Glucose 101 Bun 24, Creatinine 1.55  Pt will be admitted for cellulitis and gout attack.    Review of systems:    In addition to the HPI above,   No Headache, No changes with Vision or hearing, No problems swallowing food or Liquids, No Chest pain, Cough or Shortness of Breath, No Abdominal pain, No Nausea or Vommitting, Bowel movements are regular, No Blood in stool or Urine, No dysuria, No new skin rashes or bruises, + gout attack earlier this year No new weakness, tingling, numbness in any extremity, No recent weight gain or loss, No polyuria, polydypsia or polyphagia, No significant Mental Stressors.  A full 10 point Review of Systems was done, except as stated above, all other Review of Systems were negative.   With Past History of the following :    Past Medical History:  Diagnosis Date  . Arthritis   . B12 deficiency   . COPD (chronic obstructive pulmonary disease) ([place]) 01/2011   FVC 47%, FEV1 49%   . 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 Family history of  breast cancer   . Family history of colon cancer   . Family history of pancreatic cancer   . Family history of prostate cancer   . Family history of renal cancer   . Hyperlipidemia   . Hypertension   . Lower extremity edema   . Prostate cancer ([place])   . Secondary pulmonary hypertension 01/2011   RV syst pressure 30-40 mm Hg on ECHO       Past Surgical History:  Procedure Laterality Date  . 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 Ronelle Nigh at [facility] in [place], [place])  . ENDOVENOUS ABLATION SAPHENOUS VEIN W/ LASER Left 09/18/2016   endovenous laser ablation left greater saphenous vein by Tinnie Gens MD   . ENDOVENOUS ABLATION  SAPHENOUS VEIN W/ LASER Left 10/09/2016   endovenous laser ablation left small saphenous vein by Tinnie Gens MD   . FINGER SURGERY Right 11   rt middle finger  . PROSTATE BIOPSY    . TOTAL SHOULDER ARTHROPLASTY Right 01/21/2013   Procedure: SHOULDER HEMI ARTHROPLASTY;  Surgeon: Garald Balding, MD;  Location: [facility];  Service: Orthopedics;  Laterality: Right;  . TRANSTHORACIC ECHOCARDIOGRAM  01/2011   EF 55-60%, mild asymmetric LVH, elevated RV systolic pressure  . VASECTOMY        Social History:     Social History   Tobacco Use  . Smoking status: Never Smoker  . Smokeless tobacco: Never Used  . Tobacco comment: occ alcohol  Substance Use Topics  . Alcohol use: Yes    Alcohol/week: 0.0 oz    Comment: 2-3 drinks occasionally     Lives - at home per pt  Mobility -   Walks by self   Family History :     Family History  Problem Relation Age of Onset  . CAD Mother        MI in her 49s  . Skin cancer Mother        multiple skin cancers removed from face and head  . Cancer Father        colon and pancreas  . Epilepsy Son   . Cancer Maternal Grandmother 55       "stomach"  . Cancer Maternal Grandfather        prostate; died at the age  of 21  . Cancer Cousin        paternal first cousin kidney dx under 30  . Breast cancer Cousin        maternal first cousin with breast cancer over 50     Home Medications:   Prior to Admission medications   Medication Sig Start Date End Date Taking? Authorizing Provider  albuterol (PROVENTIL HFA;VENTOLIN HFA) 108 (90 BASE) MCG/ACT inhaler Inhale 2 puffs into the lungs every 6 (six) hours as needed for wheezing or shortness of breath.    [provider]  allopurinol (ZYLOPRIM) 100 MG tablet Take 1 tablet (100 mg total) by mouth daily. 02/10/17   Saguier, Percell Miller, PA-C  aspirin 81 MG tablet Take 81 mg by mouth daily.    [provider]  chlorthalidone (HYGROTON) 25 MG tablet Take 25 mg by mouth daily.    [provider]  clindamycin (CLEOCIN) 300 MG capsule Take 1 capsule (300 mg total) by mouth 3 (three) times daily. 02/09/17   Saguier, Percell Miller, PA-C  diltiazem (CARDIZEM CD) 300 MG 24 hr capsule Take 1 capsule (300 mg total) by mouth daily. 07/13/14   Roma Schanz R, DO  glucose blood (ONE TOUCH ULTRA TEST) test strip CHECK BLOOD SUGAR TWICE DAILY.   DX Code: E11.9 10/04/16   Carollee Herter, [name], DO  ibuprofen (ADVIL,MOTRIN) 200 MG tablet Take 200 mg by mouth every 6 (six) hours as needed.    [provider]  olmesartan (BENICAR) 20 MG tablet Take 1 tablet (20 mg total) by mouth daily. 11/29/16   Carollee Herter, [name], DO  ONETOUCH DELICA LANCETS FINE MISC Check blood sugar daily 07/03/13   Carollee Herter, Kendrick Fries R, DO  tiotropium (SPIRIVA) 18 MCG inhalation capsule Place 18 mcg into inhaler and inhale daily.    [provider]     Allergies:    No Known Allergies   Physical Exam:   Vitals  Blood  pressure (!) 143/85, pulse 90, temperature 99.6 F (37.6 C), temperature source Oral, resp. rate 18, height 5\' 11"  (1.803 m), weight (!) 138.3 kg (305 lb), SpO2 96 %.   1. General lying in bed in NAD,    2. Normal affect and insight, Not  Suicidal or Homicidal, Awake Alert, Oriented X 3.  3. No F.N deficits, ALL C.Nerves Intact, Strength 5/5 all 4 extremities, Sensation intact all 4 extremities, Plantars down going.  4. Ears and Eyes appear Normal, Conjunctivae clear, PERRLA. Moist Oral Mucosa.  5. Supple Neck, No JVD, No cervical lymphadenopathy appriciated, No Carotid Bruits.  6. Symmetrical Chest wall movement, Good air movement bilaterally, CTAB.  7. RRR, No Gallops, Rubs or Murmurs, No Parasternal Heave.  8. Positive Bowel Sounds, Abdomen Soft, No tenderness, No organomegaly appriciated,No rebound -guarding or rigidity.  9.  No Cyanosis, swelling left ankle and left knee, redness from left ankle to knee  10. Good muscle tone,  joints appear normal , no effusions, Normal ROM.  11. No Palpable Lymph Nodes in Neck or Axillae     Data Review:    CBC Recent Labs  Lab 02/09/17 1213 02/15/17 1526  WBC 11.3* 12.5*  HGB 15.0 14.6  HCT 45.5 43.4  PLT 304.0 303  MCV 86.8 83.8  MCH  --  28.2  MCHC 33.0 33.6  RDW 13.6 12.6  LYMPHSABS 1.7 1.3  MONOABS 1.0 1.3*  EOSABS 0.2 0.0  BASOSABS 0.1 0.0   ------------------------------------------------------------------------------------------------------------------  Chemistries  Recent Labs  Lab 02/09/17 1213 02/15/17 1526  NA 139 136  K 3.5 3.3*  CL 98 97*  CO2 35* 31  GLUCOSE 88 101*  BUN 23 24*  CREATININE 1.60* 1.55*  CALCIUM 9.8 9.4  AST 17  --   ALT 13  --   ALKPHOS 76  --   BILITOT 1.0  --    ------------------------------------------------------------------------------------------------------------------ estimated creatinine clearance is 62.1 mL/min (A) (by C-G formula based on SCr of 1.55 mg/dL (H)). ------------------------------------------------------------------------------------------------------------------ No results for input(s): TSH, T4TOTAL, T3FREE, THYROIDAB in the last 72 hours.  Invalid input(s): FREET3  Coagulation  profile No results for input(s): INR, PROTIME in the last 168 hours. ------------------------------------------------------------------------------------------------------------------- No results for input(s): DDIMER in the last 72 hours. -------------------------------------------------------------------------------------------------------------------  Cardiac Enzymes No results for input(s): CKMB, TROPONINI, MYOGLOBIN in the last 168 hours.  Invalid input(s): CK ------------------------------------------------------------------------------------------------------------------    Component Value Date/Time   BNP 19.0 09/09/2012 1642     ---------------------------------------------------------------------------------------------------------------  Urinalysis    Component Value Date/Time   COLORURINE AMBER (A) 08/31/2016 1503   APPEARANCEUR CLEAR 08/31/2016 1503   LABSPEC 1.016 08/31/2016 1503   PHURINE 5.0 08/31/2016 1503   GLUCOSEU NEGATIVE 08/31/2016 1503   HGBUR LARGE (A) 08/31/2016 1503   BILIRUBINUR NEGATIVE 08/31/2016 1503   BILIRUBINUR neg 08/27/2015 1458   KETONESUR NEGATIVE 08/31/2016 1503   PROTEINUR 30 (A) 08/31/2016 1503   UROBILINOGEN 0.2 08/27/2015 1458   UROBILINOGEN 0.2 01/14/2013 1032   NITRITE NEGATIVE 08/31/2016 1503   LEUKOCYTESUR NEGATIVE 08/31/2016 1503    ----------------------------------------------------------------------------------------------------------------   Imaging Results:    US Venous Img Lower Unilateral Left  Result Date: 02/15/2017 CLINICAL DATA:  Left lower extremity swelling. EXAM: Left LOWER EXTREMITY VENOUS DOPPLER ULTRASOUND TECHNIQUE: Gray-scale sonography with graded compression, as well as color Doppler and duplex ultrasound were performed to evaluate the lower extremity deep venous systems from the level of the common femoral vein and including the common femoral, femoral, profunda femoral, popliteal and calf veins including  the  posterior tibial, peroneal and gastrocnemius veins when visible. The superficial great saphenous vein was also interrogated. Spectral Doppler was utilized to evaluate flow at rest and with distal augmentation maneuvers in the common femoral, femoral and popliteal veins. COMPARISON:  None. FINDINGS: Contralateral Common Femoral Vein: Respiratory phasicity is normal and symmetric with the symptomatic side. No evidence of thrombus. Normal compressibility. Common Femoral Vein: No evidence of thrombus. Normal compressibility, respiratory phasicity and response to augmentation. Saphenofemoral Junction: No evidence of thrombus. Normal compressibility and flow on color Doppler imaging. Profunda Femoral Vein: No evidence of thrombus. Normal compressibility and flow on color Doppler imaging. Femoral Vein: No evidence of thrombus. Normal compressibility, respiratory phasicity and response to augmentation. Popliteal Vein: No evidence of thrombus. Normal compressibility, respiratory phasicity and response to augmentation. Calf Veins: Peroneal vein is not visualized. No definite thrombus is noted in posterior tibial vein. Superficial Great Saphenous Vein: Greater saphenous vein and small saphenous vein appear to be thrombosed which may be due to prior ablation. Venous Reflux:  None. Other Findings:  Multiple superficial varicosities are noted. IMPRESSION: No evidence of deep venous thrombosis seen in left lower extremity. Left greater and small saphenous veins appear to be thrombosed which may be due to prior ablation. Electronically Signed   By: Marijo Conception, M.D.   On: 02/15/2017 14:20     Assessment & Plan:    Principal Problem:   Cellulitis Active Problems:   Diabetes mellitus (HCC)   Gout   Fever, unspecified   Renal insufficiency    Gout attack Prednisone 60mg  po qday Colchicine 0.6mg  po qday  Fever Check urinalysis Check CXR Blood culture x2 pending  Cellulitis Ancef  Tachycardia Check D  dimer Trop I q6h x3 VQ scan if positive  Renal insufficiency Hydrate with ns iv Check cmp in am  Dm2 fsbs ac and qhs, ISS    DVT Prophylaxis Lovenox - SCDs  AM Labs Ordered, also please review Full Orders  Family Communication: Admission, patients condition and plan of care including tests being ordered have been discussed with the patient  who indicate understanding and agree with the plan and Code Status.  Code Status FULL CODE  Likely DC to  home  Condition GUARDED    Consults called: none  Admission status: inpatient   Time spent in minutes : 45  Jani Gravel M.D on 02/15/2017 at 10:15 PM  Between 7am to 7pm - Pager - [phone] . After 7pm go to [email or web address] - password [facility]  [facility] - Office  [phone]

## 2017-02-16 ENCOUNTER — Inpatient Hospital Stay (HOSPITAL_COMMUNITY): Payer: Medicare Other

## 2017-02-16 DIAGNOSIS — L03116 Cellulitis of left lower limb: Principal | ICD-10-CM

## 2017-02-16 DIAGNOSIS — E119 Type 2 diabetes mellitus without complications: Secondary | ICD-10-CM

## 2017-02-16 DIAGNOSIS — N289 Disorder of kidney and ureter, unspecified: Secondary | ICD-10-CM

## 2017-02-16 LAB — URINALYSIS, ROUTINE W REFLEX MICROSCOPIC
BACTERIA UA: NONE SEEN
Bilirubin Urine: NEGATIVE
Glucose, UA: NEGATIVE mg/dL
Ketones, ur: NEGATIVE mg/dL
Leukocytes, UA: NEGATIVE
NITRITE: NEGATIVE
PROTEIN: NEGATIVE mg/dL
SPECIFIC GRAVITY, URINE: 1.02 (ref 1.005–1.030)
pH: 5 (ref 5.0–8.0)

## 2017-02-16 LAB — COMPREHENSIVE METABOLIC PANEL
ALBUMIN: 3.2 g/dL — AB (ref 3.5–5.0)
ALK PHOS: 74 U/L (ref 38–126)
ALT: 17 U/L (ref 17–63)
AST: 23 U/L (ref 15–41)
Anion gap: 11 (ref 5–15)
BILIRUBIN TOTAL: 1.1 mg/dL (ref 0.3–1.2)
BUN: 19 mg/dL (ref 6–20)
CALCIUM: 9.5 mg/dL (ref 8.9–10.3)
CO2: 33 mmol/L — ABNORMAL HIGH (ref 22–32)
CREATININE: 1.64 mg/dL — AB (ref 0.61–1.24)
Chloride: 93 mmol/L — ABNORMAL LOW (ref 101–111)
GFR calc Af Amer: 47 mL/min — ABNORMAL LOW (ref >60)
GFR, EST NON AFRICAN AMERICAN: 40 mL/min — AB (ref >60)
GLUCOSE: 125 mg/dL — AB (ref 65–99)
Potassium: 3.5 mmol/L (ref 3.5–5.1)
Sodium: 137 mmol/L (ref 135–145)
TOTAL PROTEIN: 7.3 g/dL (ref 6.5–8.1)

## 2017-02-16 LAB — CBC
HCT: 44.1 % (ref 39.0–52.0)
Hemoglobin: 15.1 g/dL (ref 13.0–17.0)
MCH: 29.3 pg (ref 26.0–34.0)
MCHC: 34.2 g/dL (ref 30.0–36.0)
MCV: 85.5 fL (ref 78.0–100.0)
PLATELETS: 294 10*3/uL (ref 150–400)
RBC: 5.16 MIL/uL (ref 4.22–5.81)
RDW: 12.4 % (ref 11.5–15.5)
WBC: 13.4 10*3/uL — AB (ref 4.0–10.5)

## 2017-02-16 LAB — URIC ACID: Uric Acid, Serum: 10.3 mg/dL — ABNORMAL HIGH (ref 4.4–7.6)

## 2017-02-16 LAB — D-DIMER, QUANTITATIVE (NOT AT ARMC): D DIMER QUANT: 2.04 ug{FEU}/mL — AB (ref 0.00–0.50)

## 2017-02-16 LAB — GLUCOSE, CAPILLARY: Glucose-Capillary: 192 mg/dL — ABNORMAL HIGH (ref 65–99)

## 2017-02-16 LAB — C-REACTIVE PROTEIN: CRP: 18.9 mg/dL — AB (ref <1.0)

## 2017-02-16 LAB — TROPONIN I

## 2017-02-16 LAB — SEDIMENTATION RATE: Sed Rate: 67 mm/hr — ABNORMAL HIGH (ref 0–16)

## 2017-02-16 MED ORDER — TECHNETIUM TO 99M ALBUMIN AGGREGATED
4.2900 | Freq: Once | INTRAVENOUS | Status: AC | PRN
  Administered 2017-02-16: 4.29 via INTRAVENOUS

## 2017-02-16 MED ORDER — TECHNETIUM TC 99M DIETHYLENETRIAME-PENTAACETIC ACID
32.0000 | Freq: Once | INTRAVENOUS | Status: AC | PRN
  Administered 2017-02-16: 32 via RESPIRATORY_TRACT

## 2017-02-16 MED ORDER — ENOXAPARIN SODIUM 80 MG/0.8ML ~~LOC~~ SOLN
0.5000 mg/kg | SUBCUTANEOUS | Status: DC
  Administered 2017-02-17 – 2017-02-19 (×3): 70 mg via SUBCUTANEOUS
  Filled 2017-02-16 (×3): qty 0.8

## 2017-02-16 MED ORDER — TECHNETIUM TC 99M DIETHYLENETRIAME-PENTAACETIC ACID: 32.0000 | Freq: Once | INTRAVENOUS | Status: DC | PRN

## 2017-02-16 MED ORDER — ENOXAPARIN SODIUM 30 MG/0.3ML ~~LOC~~ SOLN
30.0000 mg | Freq: Once | SUBCUTANEOUS | Status: AC
Start: 2017-02-16 — End: 2017-02-16
  Administered 2017-02-16: 30 mg via SUBCUTANEOUS
  Filled 2017-02-16: qty 0.3

## 2017-02-16 NOTE — Consult Note (Signed)
This patient seen and examined.  Findings are consistent with gout of the left knee and left ankle. Recommend colcrys, steroid dose pak.  And if there is significant pain relief forgoe the aspiration of the left knee.  The right knee shows mild warm, periarticular swelling with mild effusion.  Findings in the lower extremities venous stasis dermatitis with pitting edema.  I will follow and aspirate if he does not respond quickly to colcrys. Note to follow.

## 2017-02-16 NOTE — Consult Note (Signed)
Reason for Consult:Left knee and left ankle swelling Referring Physician: Dr. Tana Coast Consulting Physician:Shelva Hetzer Louanne Skye  Orthopedic Diagnosis: Swelling left knee periarticular and effusion, left ankle swelling, decreased renal function, elevated uric acid level,no diabetes.  Severe osteoarthritis of the left knee with varus deformity. Obesity Venous stasis dermatitis bilateral LEs. Left leg swelling  Leukocytosis without   MWN:Travis Daniels is an 71 y.o. male.With a history of severe osteoarthritis of the left knee. Seen by Dr. Durward Fortes in 06/2016 found to have severe DJD of the knees but obesity and Chronic venous stasis with recurrent cellulitis bouts precluded  Surgical consideration. He has been told that he is prediabetic. Began experiencing increasing left leg pain this past week and has been seen in his primary care office, treated for presumed Cellulitis. Admitted today with increased pain in his left leg and  Swelling in the left knee and left ankle. No recent URI, treatment with po clindamycin for presumed cellulitis. Has seen Dr. Kellie [facility] 01/2017 who recommended multiple stab left leg phlebectomy for painful varicosities left knee.    Past Medical History:  Diagnosis Date  . Arthritis   . B12 deficiency   . COPD (chronic obstructive pulmonary disease) ([place]) 01/2011   FVC 47%, FEV1 49%   . 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 Family history of breast cancer   . Family history of colon cancer   . Family history of pancreatic cancer   . Family history of prostate cancer   . Family history of renal cancer   . Hyperlipidemia   . Hypertension   . Lower extremity edema   . Prostate cancer ([place])   . Secondary pulmonary hypertension 01/2011   RV syst pressure 30-40 mm Hg on ECHO     Past Surgical History:  Procedure Laterality Date  . COLONOSCOPY W/ POLYPECTOMY  12/19/07   one small polyp, sigmoid diverticulosis, lipoma rectum,  agioectasia/AVM in descending colon (Dr. Ronelle Nigh at [facility] in [place], [place])  . ENDOVENOUS ABLATION SAPHENOUS VEIN W/ LASER Left 09/18/2016   endovenous laser ablation left greater saphenous vein by Tinnie Gens MD   . ENDOVENOUS ABLATION SAPHENOUS VEIN W/ LASER Left 10/09/2016   endovenous laser ablation left small saphenous vein by Tinnie Gens MD   . FINGER SURGERY Right 11   rt middle finger  . PROSTATE BIOPSY    . TOTAL SHOULDER ARTHROPLASTY Right 01/21/2013   Procedure: SHOULDER HEMI ARTHROPLASTY;  Surgeon: Garald Balding, MD;  Location: [facility];  Service: Orthopedics;  Laterality: Right;  . TRANSTHORACIC ECHOCARDIOGRAM  01/2011   EF 55-60%, mild asymmetric LVH, elevated RV systolic pressure  . VASECTOMY      Family History  Problem Relation Age of Onset  . CAD Mother        MI in her 72s  . Skin cancer Mother        multiple skin cancers removed from face and head  . Cancer Father        colon and pancreas  . Epilepsy Son   . Cancer Maternal Grandmother 41       "stomach"  . Cancer Maternal Grandfather        prostate; died at the age of 29  . Cancer Cousin        paternal first cousin kidney dx under 70  . Breast cancer Cousin        maternal first cousin with breast cancer over 11  Social History:  reports that  has never smoked. he has never used smokeless tobacco. He reports that he drinks alcohol. He reports that he does not use drugs.  Allergies: No Known Allergies  Medications:  Prior to Admission:  Medications Prior to Admission  Medication Sig Dispense Refill Last Dose  . albuterol (PROVENTIL HFA;VENTOLIN HFA) 108 (90 BASE) MCG/ACT inhaler Inhale 2 puffs into the lungs every 6 (six) hours as needed for wheezing or shortness of breath.   couple weeks ago  . allopurinol (ZYLOPRIM) 100 MG tablet Take 1 tablet (100 mg total) by mouth daily. 30 tablet 6 02/14/2017  . aspirin EC 81 MG tablet Take 81 mg by mouth daily.   02/14/2017 at Unknown time  .  chlorthalidone (HYGROTON) 25 MG tablet Take 25 mg by mouth daily.   02/14/2017  . clindamycin (CLEOCIN) 300 MG capsule Take 1 capsule (300 mg total) by mouth 3 (three) times daily. (Patient taking differently: Take 300 mg by mouth 3 (three) times daily. 7 day course filled 02/09/17) 21 capsule 0 02/14/2017  . diltiazem (CARDIZEM CD) 300 MG 24 hr capsule Take 1 capsule (300 mg total) by mouth daily. 90 capsule 3 02/14/2017  . GARLIC PO Take 1 capsule by mouth daily.   week ago  . ibuprofen (ADVIL,MOTRIN) 200 MG tablet Take 400 mg by mouth every 6 (six) hours as needed (pain).    few days ago  . KRILL OIL PO Take 1 capsule by mouth daily.   02/14/2017  . olmesartan (BENICAR) 20 MG tablet Take 1 tablet (20 mg total) by mouth daily. 90 tablet 0 02/14/2017  . tiotropium (SPIRIVA) 18 MCG inhalation capsule Place 18 mcg into inhaler and inhale daily.   02/14/2017  . glucose blood (ONE TOUCH ULTRA TEST) test strip CHECK BLOOD SUGAR TWICE DAILY.   DX Code: E11.9 100 each 1 Taking  . ONETOUCH DELICA LANCETS FINE MISC Check blood sugar daily 100 each 12 Taking   Scheduled: . allopurinol  100 mg Oral Daily  . aspirin EC  81 mg Oral Daily  . colchicine  0.6 mg Oral Daily  . diltiazem  300 mg Oral Daily  . [START ON 02/17/2017] enoxaparin (LOVENOX) injection  0.5 mg/kg Subcutaneous Q24H  . irbesartan  75 mg Oral Daily  . predniSONE  60 mg Oral Q breakfast  . tiotropium  18 mcg Inhalation Daily   Continuous: . sodium chloride 75 mL/hr at 02/16/17 1645  .  ceFAZolin (ANCEF) IV 2 g (02/16/17 2107)   QIH:[id number]  Results for orders placed or performed during the hospital encounter of 02/15/17 (from the past 48 hour(s))  I-Stat CG4 Lactic Acid, ED     Status: None   Collection Time: 02/15/17  1:16 PM  Result Value Ref Range   Lactic Acid, Venous 1.85 0.5 - 1.9 mmol/L  CBC with Differential     Status: Abnormal   Collection Time: 02/15/17  3:26 PM  Result Value Ref Range   WBC 12.5 (H) 4.0 - 10.5 K/uL    RBC 5.18 4.22 - 5.81 MIL/uL   Hemoglobin 14.6 13.0 - 17.0 g/dL   HCT 43.4 39.0 - 52.0 %   MCV 83.8 78.0 - 100.0 fL   MCH 28.2 26.0 - 34.0 pg   MCHC 33.6 30.0 - 36.0 g/dL   RDW 12.6 11.5 - 15.5 %   Platelets 303 150 - 400 K/uL   Neutrophils Relative % 80 %   Lymphocytes Relative 10 %   Monocytes Relative  10 %   Eosinophils Relative 0 %   Basophils Relative 0 %   Neutro Abs 9.9 (H) 1.7 - 7.7 K/uL   Lymphs Abs 1.3 0.7 - 4.0 K/uL   Monocytes Absolute 1.3 (H) 0.1 - 1.0 K/uL   Eosinophils Absolute 0.0 0.0 - 0.7 K/uL   Basophils Absolute 0.0 0.0 - 0.1 K/uL   Smear Review MORPHOLOGY UNREMARKABLE   Basic metabolic panel     Status: Abnormal   Collection Time: 02/15/17  3:26 PM  Result Value Ref Range   Sodium 136 135 - 145 mmol/L   Potassium 3.3 (L) 3.5 - 5.1 mmol/L   Chloride 97 (L) 101 - 111 mmol/L   CO2 31 22 - 32 mmol/L   Glucose, Bld 101 (H) 65 - 99 mg/dL   BUN 24 (H) 6 - 20 mg/dL   Creatinine, Ser 1.55 (H) 0.61 - 1.24 mg/dL   Calcium 9.4 8.9 - 10.3 mg/dL   GFR calc non Af Amer 43 (L) >60 mL/min   GFR calc Af Amer 50 (L) >60 mL/min    Comment: (NOTE) The eGFR has been calculated using the CKD EPI equation. This calculation has not been validated in all clinical situations. eGFR's persistently <60 mL/min signify possible Chronic Kidney Disease.    Anion gap 8 5 - 15  Comprehensive metabolic panel     Status: Abnormal   Collection Time: 02/16/17 12:09 AM  Result Value Ref Range   Sodium 137 135 - 145 mmol/L   Potassium 3.5 3.5 - 5.1 mmol/L   Chloride 93 (L) 101 - 111 mmol/L   CO2 33 (H) 22 - 32 mmol/L   Glucose, Bld 125 (H) 65 - 99 mg/dL   BUN 19 6 - 20 mg/dL   Creatinine, Ser 1.64 (H) 0.61 - 1.24 mg/dL   Calcium 9.5 8.9 - 10.3 mg/dL   Total Protein 7.3 6.5 - 8.1 g/dL   Albumin 3.2 (L) 3.5 - 5.0 g/dL   AST 23 15 - 41 U/L   ALT 17 17 - 63 U/L   Alkaline Phosphatase 74 38 - 126 U/L   Total Bilirubin 1.1 0.3 - 1.2 mg/dL   GFR calc non Af Amer 40 (L) >60 mL/min   GFR  calc Af Amer 47 (L) >60 mL/min    Comment: (NOTE) The eGFR has been calculated using the CKD EPI equation. This calculation has not been validated in all clinical situations. eGFR's persistently <60 mL/min signify possible Chronic Kidney Disease.    Anion gap 11 5 - 15  CBC     Status: Abnormal   Collection Time: 02/16/17 12:09 AM  Result Value Ref Range   WBC 13.4 (H) 4.0 - 10.5 K/uL   RBC 5.16 4.22 - 5.81 MIL/uL   Hemoglobin 15.1 13.0 - 17.0 g/dL   HCT 44.1 39.0 - 52.0 %   MCV 85.5 78.0 - 100.0 fL   MCH 29.3 26.0 - 34.0 pg   MCHC 34.2 30.0 - 36.0 g/dL   RDW 12.4 11.5 - 15.5 %   Platelets 294 150 - 400 K/uL  D-dimer, quantitative (not at [facility])     Status: Abnormal   Collection Time: 02/16/17 12:09 AM  Result Value Ref Range   D-Dimer, Quant 2.04 (H) 0.00 - 0.50 ug/mL-FEU    Comment: (NOTE) At the manufacturer cut-off of 0.50 ug/mL FEU, this assay has been documented to exclude PE with a sensitivity and negative predictive value of 97 to 99%.  At this time, this assay  has not been approved by the FDA to exclude DVT/VTE. Results should be correlated with clinical presentation.   Urinalysis, Routine w reflex microscopic     Status: Abnormal   Collection Time: 02/16/17  4:57 AM  Result Value Ref Range   Color, Urine YELLOW YELLOW   APPearance CLEAR CLEAR   Specific Gravity, Urine 1.020 1.005 - 1.030   pH 5.0 5.0 - 8.0   Glucose, UA NEGATIVE NEGATIVE mg/dL   Hgb urine dipstick MODERATE (A) NEGATIVE   Bilirubin Urine NEGATIVE NEGATIVE   Ketones, ur NEGATIVE NEGATIVE mg/dL   Protein, ur NEGATIVE NEGATIVE mg/dL   Nitrite NEGATIVE NEGATIVE   Leukocytes, UA NEGATIVE NEGATIVE   RBC / HPF 0-5 0 - 5 RBC/hpf   WBC, UA 0-5 0 - 5 WBC/hpf   Bacteria, UA NONE SEEN NONE SEEN   Squamous Epithelial / LPF 0-5 (A) NONE SEEN   Mucus PRESENT   C-reactive protein     Status: Abnormal   Collection Time: 02/16/17 10:55 AM  Result Value Ref Range   CRP 18.9 (H) <1.0 mg/dL  Sedimentation  rate     Status: Abnormal   Collection Time: 02/16/17 10:55 AM  Result Value Ref Range   Sed Rate 67 (H) 0 - 16 mm/hr  Uric acid     Status: Abnormal   Collection Time: 02/16/17 10:55 AM  Result Value Ref Range   Uric Acid, Serum 10.3 (H) 4.4 - 7.6 mg/dL    Dg Chest 2 View  Result Date: 02/16/2017 CLINICAL DATA:  History are cellulitis and COPD. EXAM: CHEST  2 VIEW COMPARISON:  01/23/2013 FINDINGS: Enlarged cardiac silhouette. Chronic elevation of the left hemidiaphragm. Left lower lobe airspace consolidation versus atelectasis. Osseous structures are without acute abnormality. Soft tissues are grossly normal. IMPRESSION: Chronic elevation of the left hemidiaphragm. Left lower lobe airspace consolidation versus atelectasis. Electronically Signed   By: Fidela Salisbury M.D.   On: 02/16/2017 03:01   Nm Pulmonary Perf And Vent  Result Date: 02/16/2017 CLINICAL DATA:  History of pulmonary artery hypertension. Lower extremity edema. Chronic difficulty breathing EXAM: NUCLEAR MEDICINE VENTILATION - PERFUSION LUNG SCAN VIEWS: Anterior, posterior, left lateral, right lateral, RPO, LPO, RAO, LAO -ventilation and perffusion RADIOPHARMACEUTICALS:  32.0 mCi Technetium-12mDTPA aerosol inhalation and 4.29 mCi Technetium-957mAA IV COMPARISON:  Chest radiograph February 15, 2017 FINDINGS: Ventilation: There is decreased uptake in the left lung compared to the right. Chest radiograph shows elevation of the left hemidiaphragm with relative volume loss on the left compared to the right. There is no focal segmental or significant subsegmental ventilation defect. Perfusion: Decreased uptake is noted in the left base at site of elevation of left hemidiaphragm and relative volume loss the left compared to the right, matching the ventilation defect. There are occasional subsegmental perfusion defects which match ventilation defects. There is no appreciable ventilation/ perfusion mismatch. No segmental or significant  subsegmental perfusion defect evident. IMPRESSION: Matching decreased uptake in the left base at the site of elevation of the left hemidiaphragm and volume loss on chest radiograph. No segmental or significant subsegmental perfusion defects evident. No appreciable ventilation/perfusion mismatch. This study overall constitutes a low probability of pulmonary embolus. Electronically Signed   By: WiLowella GripII M.D.   On: 02/16/2017 14:44   UsKoreaenous Img Lower Unilateral Left  Result Date: 02/15/2017 CLINICAL DATA:  Left lower extremity swelling. EXAM: Left LOWER EXTREMITY VENOUS DOPPLER ULTRASOUND TECHNIQUE: Gray-scale sonography with graded compression, as well as color Doppler and duplex ultrasound  were performed to evaluate the lower extremity deep venous systems from the level of the common femoral vein and including the common femoral, femoral, profunda femoral, popliteal and calf veins including the posterior tibial, peroneal and gastrocnemius veins when visible. The superficial great saphenous vein was also interrogated. Spectral Doppler was utilized to evaluate flow at rest and with distal augmentation maneuvers in the common femoral, femoral and popliteal veins. COMPARISON:  None. FINDINGS: Contralateral Common Femoral Vein: Respiratory phasicity is normal and symmetric with the symptomatic side. No evidence of thrombus. Normal compressibility. Common Femoral Vein: No evidence of thrombus. Normal compressibility, respiratory phasicity and response to augmentation. Saphenofemoral Junction: No evidence of thrombus. Normal compressibility and flow on color Doppler imaging. Profunda Femoral Vein: No evidence of thrombus. Normal compressibility and flow on color Doppler imaging. Femoral Vein: No evidence of thrombus. Normal compressibility, respiratory phasicity and response to augmentation. Popliteal Vein: No evidence of thrombus. Normal compressibility, respiratory phasicity and response to  augmentation. Calf Veins: Peroneal vein is not visualized. No definite thrombus is noted in posterior tibial vein. Superficial Great Saphenous Vein: Greater saphenous vein and small saphenous vein appear to be thrombosed which may be due to prior ablation. Venous Reflux:  None. Other Findings:  Multiple superficial varicosities are noted. IMPRESSION: No evidence of deep venous thrombosis seen in left lower extremity. Left greater and small saphenous veins appear to be thrombosed which may be due to prior ablation. Electronically Signed   By: Marijo Conception, M.D.   On: 02/15/2017 14:20    ROS Blood pressure 122/73, pulse 77, temperature 98 F (36.7 C), temperature source Oral, resp. rate 16, height 5' 11"  (1.803 m), weight (!) 305 lb (138.3 kg), SpO2 97 %. Physical Exam Awake, alert and oriented x4, left leg is painful, He is not showing signs of systemic illness, no shortness of breath, no diaphoresis, he is afebrile.Breathing normally, Obese Left leg is held slightly flexed with varus deformity. Able to extend to 5-10 degress short of neutral. There is left knee  Periarticular swelling and mild warmth, it is not exquisitely tender. Positive ballotment sign. No erythrema, The left leg is swollen with pitting edema, sock indents the calf. Moderate left ankle swelling that is diffuse. Venous stasis discoloration left calf. Painful ROM of the left ankle and left knee.     Assessment/Plan: Painful swelling left knee and left ankle in a relatively well appearing obese male with elevated creatine and elevated CRP and elevated uric acid is most consistent with gout. The left calf though swollen does not appear warm or erythrematous and seems to be most consistent with venous stasis dermatis with  Venous stasis discoloration and edema. Severe osteoarthritis left knee.  Renal insufficiency Swelling left leg with history of venous insufficiency, phlebitis Is likely.  Plan: Recommend colcrys and  allopurinol, consider use of  Steroid dose pak as he has renal insuffiency. If no response to colchicine and allopurinol and no improvemen to pain pattern then I will aspirate the knee and the left ankle. Elevation of the extremities would improve venous stasis swelling. I will follow with you.    Basil Dess 02/16/2017, 10:13 PM  Cell: [phone]

## 2017-02-16 NOTE — Progress Notes (Signed)
Pt. Has cpap for tonight, able to place on when he is ready. RT informed pt. To notify if he needed any assistance.

## 2017-02-16 NOTE — Progress Notes (Signed)
[facility]                                                                              Patient Demographics  Travis Daniels, is a 71 y.o. male, DOB - 1945-05-13, ZYY:[id number]  Admit date - 02/15/2017   Admitting Physician Kayleen Memos, DO  Outpatient Primary MD for the patient is Carollee Herter, [name], DO  Outpatient specialists:   LOS - 1  days   Medical records reviewed and are as summarized below:    Chief Complaint  Patient presents with  . Cellulitis       Brief summary   Per admit note by Dr Maudie Mercury on 12/6  Travis Daniels  is a 71 y.o. male, w prostate cancer, Copd, pulm hypertension, b12 deficiency who apparently c/o swelling of the left ankle starting last Wednesday.  Pt notes severe pain in the ankle as well, no hx of trauma. And also now the left knee.  There is redness of the left distal lower ext as well.  Pt denies cp, palp, sob, n/v, diarrhea, brbpr, black stool.     Assessment & Plan    Principal Problem:   Cellulitis left ankle -Continue IV Ancef for now -Follow ESR, CRP, uric acid  Active Problems: Left knee effusion -Patient has a known history of osteoarthritis in the left knee however appears to have joint effusion and tenderness -Doppler ultrasound of the left lower extremity negative for DVT -[facility] consulted for tap to rule out septic arthritis, crystal -Currently on allopurinol, colchicine and prednisone  Elevated d-dimer -Doppler ultrasound of the LLE negative for DVT, obtain VQ scan     Diabetes mellitus ([place]) -Placed on sliding scale insulin while inpatient  Chronic kidney disease stage III, baseline creatinine 1.4-1.6 -Follow creatinine function closely, currently close to baseline  Code Status: Full CODE STATUS DVT Prophylaxis:  Lovenox  Family Communication: Discussed in detail with the patient, all imaging results, lab results explained to the patient   Disposition Plan:  Time Spent in minutes    39mnutes  Procedures:    Consultants:   Orthopedics  Antimicrobials:   IV Ancef 12/6   Medications  Scheduled Meds: . allopurinol  100 mg Oral Daily  . aspirin EC  81 mg Oral Daily  . colchicine  0.6 mg Oral Daily  . diltiazem  300 mg Oral Daily  . enoxaparin (LOVENOX) injection  40 mg Subcutaneous Q24H  . irbesartan  75 mg Oral Daily  . predniSONE  60 mg Oral Q breakfast  . tiotropium  18 mcg Inhalation Daily   Continuous Infusions: . sodium chloride 75 mL/hr at 02/16/17 0025  .  ceFAZolin (ANCEF) IV Stopped (02/16/17 0749)   PRN Meds:.albuterol   Antibiotics   Anti-infectives (From admission, onward)   Start     Dose/Rate Route Frequency Ordered Stop   02/15/17 2345  ceFAZolin (ANCEF) IVPB 2g/100 mL premix     2 g 200 mL/hr over 30 Minutes Intravenous Every 8 hours 02/15/17 2338     02/15/17 1545  ceFAZolin (ANCEF) IVPB 1 g/50 mL premix     1 g 100 mL/hr over  30 Minutes Intravenous  Once 02/15/17 1530 02/15/17 1712        Subjective:   Travis Daniels was seen and examined today.  Complaining of pain in the left ankle and left knee.  Low-grade temp overnight. Patient denies dizziness, chest pain, shortness of breath, abdominal pain, N/V/D/C, new weakness, numbess, tingling.   Objective:   Vitals:   02/15/17 2115 02/16/17 0202 02/16/17 0537 02/16/17 0949  BP: (!) 143/85  126/87   Pulse: 90 88 100   Resp: 18 18 16    Temp: 99.6 F (37.6 C)  98.7 F (37.1 C)   TempSrc: Oral  Oral   SpO2: 96% 95% 94% 94%  Weight:      Height:        Intake/Output Summary (Last 24 hours) at 02/16/2017 1033 Last data filed at 02/16/2017 1009 Gross per 24 hour  Intake 1225 ml  Output 500 ml  Net 725 ml     Wt Readings from Last 3 Encounters:  02/15/17 (!) 138.3 kg (305 lb)  02/12/17 (!) 138.7 kg (305 lb 12.8 oz)  02/09/17 (!) 138.7 kg (305 lb 12.8 oz)     Exam  General: Alert and oriented x 3, NAD  Eyes:   HEENT:  Atraumatic,  normocephalic  Cardiovascular: S1 S2 auscultated, no rubs, murmurs or gallops. Regular rate and rhythm.  Respiratory: Clear to auscultation bilaterally, no wheezing, rales or rhonchi  Gastrointestinal: Soft, nontender, nondistended, + bowel sounds  Ext: left LE swollen compared to right, left ankle erythematous, tender, left knee swollen, IOM decreased  Neuro: no new deficits  Musculoskeletal: No digital cyanosis, clubbing  Skin: No rashes  Psych: Normal affect and demeanor, alert and oriented x3    Data Reviewed:  I have personally reviewed following labs and imaging studies  Micro Results No results found for this or any previous visit (from the past 240 hour(s)).  Radiology Reports Dg Chest 2 View  Result Date: 02/16/2017 CLINICAL DATA:  History are cellulitis and COPD. EXAM: CHEST  2 VIEW COMPARISON:  01/23/2013 FINDINGS: Enlarged cardiac silhouette. Chronic elevation of the left hemidiaphragm. Left lower lobe airspace consolidation versus atelectasis. Osseous structures are without acute abnormality. Soft tissues are grossly normal. IMPRESSION: Chronic elevation of the left hemidiaphragm. Left lower lobe airspace consolidation versus atelectasis. Electronically Signed   By: Fidela Salisbury M.D.   On: 02/16/2017 03:01   US Venous Img Lower Unilateral Left  Result Date: 02/15/2017 CLINICAL DATA:  Left lower extremity swelling. EXAM: Left LOWER EXTREMITY VENOUS DOPPLER ULTRASOUND TECHNIQUE: Gray-scale sonography with graded compression, as well as color Doppler and duplex ultrasound were performed to evaluate the lower extremity deep venous systems from the level of the common femoral vein and including the common femoral, femoral, profunda femoral, popliteal and calf veins including the posterior tibial, peroneal and gastrocnemius veins when visible. The superficial great saphenous vein was also interrogated. Spectral Doppler was utilized to evaluate flow at rest and with distal  augmentation maneuvers in the common femoral, femoral and popliteal veins. COMPARISON:  None. FINDINGS: Contralateral Common Femoral Vein: Respiratory phasicity is normal and symmetric with the symptomatic side. No evidence of thrombus. Normal compressibility. Common Femoral Vein: No evidence of thrombus. Normal compressibility, respiratory phasicity and response to augmentation. Saphenofemoral Junction: No evidence of thrombus. Normal compressibility and flow on color Doppler imaging. Profunda Femoral Vein: No evidence of thrombus. Normal compressibility and flow on color Doppler imaging. Femoral Vein: No evidence of thrombus. Normal compressibility, respiratory phasicity and response to augmentation.  Popliteal Vein: No evidence of thrombus. Normal compressibility, respiratory phasicity and response to augmentation. Calf Veins: Peroneal vein is not visualized. No definite thrombus is noted in posterior tibial vein. Superficial Great Saphenous Vein: Greater saphenous vein and small saphenous vein appear to be thrombosed which may be due to prior ablation. Venous Reflux:  None. Other Findings:  Multiple superficial varicosities are noted. IMPRESSION: No evidence of deep venous thrombosis seen in left lower extremity. Left greater and small saphenous veins appear to be thrombosed which may be due to prior ablation. Electronically Signed   By: Marijo Conception, M.D.   On: 02/15/2017 14:20    Lab Data:  CBC: Recent Labs  Lab 02/09/17 1213 02/15/17 1526 02/16/17 0009  WBC 11.3* 12.5* 13.4*  NEUTROABS 8.3* 9.9*  --   HGB 15.0 14.6 15.1  HCT 45.5 43.4 44.1  MCV 86.8 83.8 85.5  PLT 304.0 303 462   Basic Metabolic Panel: Recent Labs  Lab 02/09/17 1213 02/15/17 1526 02/16/17 0009  NA 139 136 137  K 3.5 3.3* 3.5  CL 98 97* 93*  CO2 35* 31 33*  GLUCOSE 88 101* 125*  BUN 23 24* 19  CREATININE 1.60* 1.55* 1.64*  CALCIUM 9.8 9.4 9.5   GFR: Estimated Creatinine Clearance: 58.7 mL/min (A) (by C-G  formula based on SCr of 1.64 mg/dL (H)). Liver Function Tests: Recent Labs  Lab 02/09/17 1213 02/16/17 0009  AST 17 23  ALT 13 17  ALKPHOS 76 74  BILITOT 1.0 1.1  PROT 7.4 7.3  ALBUMIN 3.9 3.2*   No results for input(s): LIPASE, AMYLASE in the last 168 hours. No results for input(s): AMMONIA in the last 168 hours. Coagulation Profile: No results for input(s): INR, PROTIME in the last 168 hours. Cardiac Enzymes: No results for input(s): CKTOTAL, CKMB, CKMBINDEX, TROPONINI in the last 168 hours. BNP (last 3 results) No results for input(s): PROBNP in the last 8760 hours. HbA1C: No results for input(s): HGBA1C in the last 72 hours. CBG: No results for input(s): GLUCAP in the last 168 hours. Lipid Profile: No results for input(s): CHOL, HDL, LDLCALC, TRIG, CHOLHDL, LDLDIRECT in the last 72 hours. Thyroid Function Tests: No results for input(s): TSH, T4TOTAL, FREET4, T3FREE, THYROIDAB in the last 72 hours. Anemia Panel: No results for input(s): VITAMINB12, FOLATE, FERRITIN, TIBC, IRON, RETICCTPCT in the last 72 hours. Urine analysis:    Component Value Date/Time   COLORURINE YELLOW 02/16/2017 [place] 02/16/2017 0457   LABSPEC 1.020 02/16/2017 0457   PHURINE 5.0 02/16/2017 0457   GLUCOSEU NEGATIVE 02/16/2017 0457   HGBUR MODERATE (A) 02/16/2017 0457   BILIRUBINUR NEGATIVE 02/16/2017 0457   BILIRUBINUR neg 08/27/2015 1458   KETONESUR NEGATIVE 02/16/2017 0457   PROTEINUR NEGATIVE 02/16/2017 0457   UROBILINOGEN 0.2 08/27/2015 1458   UROBILINOGEN 0.2 01/14/2013 1032   NITRITE NEGATIVE 02/16/2017 0457   LEUKOCYTESUR NEGATIVE 02/16/2017 0457     Uma Jerde M.D. [facility] 02/16/2017, 10:33 AM  Pager: [phone] Between 7am to 7pm - call Pager - [phone]  After 7pm go to [email or web address] - password [email or web address]  Call night coverage person covering after 7pm

## 2017-02-17 DIAGNOSIS — I872 Venous insufficiency (chronic) (peripheral): Secondary | ICD-10-CM

## 2017-02-17 DIAGNOSIS — L03116 Cellulitis of left lower limb: Secondary | ICD-10-CM

## 2017-02-17 DIAGNOSIS — M109 Gout, unspecified: Secondary | ICD-10-CM

## 2017-02-17 LAB — BASIC METABOLIC PANEL
Anion gap: 8 (ref 5–15)
BUN: 25 mg/dL — AB (ref 6–20)
CHLORIDE: 98 mmol/L — AB (ref 101–111)
CO2: 33 mmol/L — AB (ref 22–32)
CREATININE: 1.5 mg/dL — AB (ref 0.61–1.24)
Calcium: 8.9 mg/dL (ref 8.9–10.3)
GFR calc non Af Amer: 45 mL/min — ABNORMAL LOW (ref >60)
GFR, EST AFRICAN AMERICAN: 52 mL/min — AB (ref >60)
Glucose, Bld: 113 mg/dL — ABNORMAL HIGH (ref 65–99)
Potassium: 3.6 mmol/L (ref 3.5–5.1)
Sodium: 139 mmol/L (ref 135–145)

## 2017-02-17 LAB — CBC
HCT: 39.7 % (ref 39.0–52.0)
Hemoglobin: 13.3 g/dL (ref 13.0–17.0)
MCH: 28.3 pg (ref 26.0–34.0)
MCHC: 33.5 g/dL (ref 30.0–36.0)
MCV: 84.5 fL (ref 78.0–100.0)
PLATELETS: 306 10*3/uL (ref 150–400)
RBC: 4.7 MIL/uL (ref 4.22–5.81)
RDW: 12.3 % (ref 11.5–15.5)
WBC: 13.9 10*3/uL — ABNORMAL HIGH (ref 4.0–10.5)

## 2017-02-17 LAB — GLUCOSE, CAPILLARY
GLUCOSE-CAPILLARY: 103 mg/dL — AB (ref 65–99)
Glucose-Capillary: 111 mg/dL — ABNORMAL HIGH (ref 65–99)
Glucose-Capillary: 146 mg/dL — ABNORMAL HIGH (ref 65–99)
Glucose-Capillary: 166 mg/dL — ABNORMAL HIGH (ref 65–99)

## 2017-02-17 NOTE — Progress Notes (Signed)
Pt places self on/off cpap when ready.  Rt will monitor. ?

## 2017-02-17 NOTE — Progress Notes (Signed)
[name], male   DOB: 1946-01-05, 71 y.o.   MRN: [id number] Swelling left leg improved with elevation of the left leg, skin is  Depressed at the top of the foot sock, this is causing a venous tourniquet of the left foot.  Sock removed left foot and ACE wrap applied to the left foot and leg to improve swelling. Compression hose would be helpful due to chronic venous congestion.  Pain in the left knee and left ankle is improving. Swelling left knee and left ankle improving. Need guidance in food preferences to avoid heavy protein, uricosemic foods and increase antioxidants in diet.  Improving with colchicine.

## 2017-02-17 NOTE — Progress Notes (Signed)
[facility]                                                                              Patient Demographics  Travis Daniels, is a 71 y.o. male, DOB - 09/24/1945, YHC:[id number]  Admit date - 02/15/2017   Admitting Physician Kayleen Memos, DO  Outpatient Primary MD for the patient is Carollee Herter, [name], DO  Outpatient specialists:   LOS - 2  days   Medical records reviewed and are as summarized below:    Chief Complaint  Patient presents with  . Cellulitis       Brief summary   Per admit note by Dr Maudie Mercury on 12/6  Travis Daniels  is a 71 y.o. male, w prostate cancer, Copd, pulm hypertension, b12 deficiency who apparently c/o swelling of the left ankle starting last Wednesday.  Pt notes severe pain in the ankle as well, no hx of trauma. And also now the left knee.  There is redness of the left distal lower ext as well.  Pt denies cp, palp, sob, n/v, diarrhea, brbpr, black stool.     Assessment & Plan    Principal Problem:   Cellulitis left ankle, acute gout -Continue IV Ancef for now -Uric acid 10.3, CRP elevated 18.9, ESR 67  Active Problems: Left knee effusion, acute gout -Patient has a known history of osteoarthritis in the left knee however appears to have joint effusion and tenderness -Doppler ultrasound of the left lower extremity negative for DVT -Appreciate orthopedics recommendations -Gout pain improving, patient was able to bear weight on his left leg.  Currently on allopurinol, colchicine and prednisone  Elevated d-dimer -Doppler ultrasound of the LLE negative for DVT -VQ scan negative for PE    Diabetes mellitus ([place]) -Continue sliding scale insulin, mild hyperglycemia due to steroids.   Chronic kidney disease stage III, baseline creatinine 1.4-1.6 -Follow creatinine function, creatinine at baseline currently   obesity BMI 42.5, counseled patient on diet and weight control  Code Status: Full CODE STATUS DVT Prophylaxis:   Lovenox  Family Communication: Discussed in detail with the patient, all imaging results, lab results explained to the patient   Disposition Plan: Possible DC next 24-48 hrs   Time Spent in minutes   37mnutes  Procedures:    Consultants:   Orthopedics  Antimicrobials:   IV Ancef 12/6   Medications  Scheduled Meds: . allopurinol  100 mg Oral Daily  . aspirin EC  81 mg Oral Daily  . colchicine  0.6 mg Oral Daily  . diltiazem  300 mg Oral Daily  . enoxaparin (LOVENOX) injection  0.5 mg/kg Subcutaneous Q24H  . irbesartan  75 mg Oral Daily  . predniSONE  60 mg Oral Q breakfast  . tiotropium  18 mcg Inhalation Daily   Continuous Infusions: .  ceFAZolin (ANCEF) IV 2 g (02/17/17 0619)   PRN Meds:.albuterol   Antibiotics   Anti-infectives (From admission, onward)   Start     Dose/Rate Route Frequency Ordered Stop   02/15/17 2345  ceFAZolin (ANCEF) IVPB 2g/100 mL premix     2 g 200 mL/hr over 30 Minutes Intravenous Every 8  hours 02/15/17 2338     02/15/17 1545  ceFAZolin (ANCEF) IVPB 1 g/50 mL premix     1 g 100 mL/hr over 30 Minutes Intravenous  Once 02/15/17 1530 02/15/17 1712        Subjective:   Travis Daniels was seen and examined today.  Improving pain in the left ankle and left knee.  No fevers this morning.  Able to bear weight on his left leg.  Patient denies dizziness, chest pain, shortness of breath, abdominal pain, N/V/D/C, new weakness, numbess, tingling.   Objective:   Vitals:   02/16/17 2016 02/17/17 0020 02/17/17 0446 02/17/17 0853  BP: 122/73 111/65 118/79   Pulse: 77 73 68 75  Resp: 16 16 17 16   Temp: 98 F (36.7 C) 98.4 F (36.9 C) 98 F (36.7 C)   TempSrc: Oral Oral Oral   SpO2: 97% 98% 96% 98%  Weight:      Height:        Intake/Output Summary (Last 24 hours) at 02/17/2017 1304 Last data filed at 02/16/2017 2129 Gross per 24 hour  Intake 958.75 ml  Output 600 ml  Net 358.75 ml     Wt Readings from Last 3 Encounters:    02/15/17 (!) 138.3 kg (305 lb)  02/12/17 (!) 138.7 kg (305 lb 12.8 oz)  02/09/17 (!) 138.7 kg (305 lb 12.8 oz)     Exam   General: Alert and oriented x 3, NAD  Eyes:   HEENT:  Atraumatic, normocephalic  Cardiovascular: S1 S2 auscultated, no rubs, murmurs or gallops. Regular rate and rhythm.  Left ankle edema  Respiratory: Clear to auscultation bilaterally, no wheezing, rales or rhonchi  Gastrointestinal: Soft, nontender, nondistended, + bowel sounds  Ext: left ankle edema, left knee effusion improving, improving ROM  Neuro: new deficits  Musculoskeletal: No digital cyanosis, clubbing  Skin: No rashes  Psych: Normal affect and demeanor, alert and oriented x3    Data Reviewed:  I have personally reviewed following labs and imaging studies  Micro Results No results found for this or any previous visit (from the past 240 hour(s)).  Radiology Reports Dg Chest 2 View  Result Date: 02/16/2017 CLINICAL DATA:  History are cellulitis and COPD. EXAM: CHEST  2 VIEW COMPARISON:  01/23/2013 FINDINGS: Enlarged cardiac silhouette. Chronic elevation of the left hemidiaphragm. Left lower lobe airspace consolidation versus atelectasis. Osseous structures are without acute abnormality. Soft tissues are grossly normal. IMPRESSION: Chronic elevation of the left hemidiaphragm. Left lower lobe airspace consolidation versus atelectasis. Electronically Signed   By: Fidela Salisbury M.D.   On: 02/16/2017 03:01   Nm Pulmonary Perf And Vent  Result Date: 02/16/2017 CLINICAL DATA:  History of pulmonary artery hypertension. Lower extremity edema. Chronic difficulty breathing EXAM: NUCLEAR MEDICINE VENTILATION - PERFUSION LUNG SCAN VIEWS: Anterior, posterior, left lateral, right lateral, RPO, LPO, RAO, LAO -ventilation and perffusion RADIOPHARMACEUTICALS:  32.0 mCi Technetium-35mDTPA aerosol inhalation and 4.29 mCi Technetium-942mAA IV COMPARISON:  Chest radiograph February 15, 2017 FINDINGS:  Ventilation: There is decreased uptake in the left lung compared to the right. Chest radiograph shows elevation of the left hemidiaphragm with relative volume loss on the left compared to the right. There is no focal segmental or significant subsegmental ventilation defect. Perfusion: Decreased uptake is noted in the left base at site of elevation of left hemidiaphragm and relative volume loss the left compared to the right, matching the ventilation defect. There are occasional subsegmental perfusion defects which match ventilation defects. There is no appreciable  ventilation/ perfusion mismatch. No segmental or significant subsegmental perfusion defect evident. IMPRESSION: Matching decreased uptake in the left base at the site of elevation of the left hemidiaphragm and volume loss on chest radiograph. No segmental or significant subsegmental perfusion defects evident. No appreciable ventilation/perfusion mismatch. This study overall constitutes a low probability of pulmonary embolus. Electronically Signed   By: Lowella Grip III M.D.   On: 02/16/2017 14:44   US Venous Img Lower Unilateral Left  Result Date: 02/15/2017 CLINICAL DATA:  Left lower extremity swelling. EXAM: Left LOWER EXTREMITY VENOUS DOPPLER ULTRASOUND TECHNIQUE: Gray-scale sonography with graded compression, as well as color Doppler and duplex ultrasound were performed to evaluate the lower extremity deep venous systems from the level of the common femoral vein and including the common femoral, femoral, profunda femoral, popliteal and calf veins including the posterior tibial, peroneal and gastrocnemius veins when visible. The superficial great saphenous vein was also interrogated. Spectral Doppler was utilized to evaluate flow at rest and with distal augmentation maneuvers in the common femoral, femoral and popliteal veins. COMPARISON:  None. FINDINGS: Contralateral Common Femoral Vein: Respiratory phasicity is normal and symmetric with the  symptomatic side. No evidence of thrombus. Normal compressibility. Common Femoral Vein: No evidence of thrombus. Normal compressibility, respiratory phasicity and response to augmentation. Saphenofemoral Junction: No evidence of thrombus. Normal compressibility and flow on color Doppler imaging. Profunda Femoral Vein: No evidence of thrombus. Normal compressibility and flow on color Doppler imaging. Femoral Vein: No evidence of thrombus. Normal compressibility, respiratory phasicity and response to augmentation. Popliteal Vein: No evidence of thrombus. Normal compressibility, respiratory phasicity and response to augmentation. Calf Veins: Peroneal vein is not visualized. No definite thrombus is noted in posterior tibial vein. Superficial Great Saphenous Vein: Greater saphenous vein and small saphenous vein appear to be thrombosed which may be due to prior ablation. Venous Reflux:  None. Other Findings:  Multiple superficial varicosities are noted. IMPRESSION: No evidence of deep venous thrombosis seen in left lower extremity. Left greater and small saphenous veins appear to be thrombosed which may be due to prior ablation. Electronically Signed   By: Marijo Conception, M.D.   On: 02/15/2017 14:20    Lab Data:  CBC: Recent Labs  Lab 02/15/17 1526 02/16/17 0009 02/17/17 0518  WBC 12.5* 13.4* 13.9*  NEUTROABS 9.9*  --   --   HGB 14.6 15.1 13.3  HCT 43.4 44.1 39.7  MCV 83.8 85.5 84.5  PLT 303 294 376   Basic Metabolic Panel: Recent Labs  Lab 02/15/17 1526 02/16/17 0009 02/17/17 0518  NA 136 137 139  K 3.3* 3.5 3.6  CL 97* 93* 98*  CO2 31 33* 33*  GLUCOSE 101* 125* 113*  BUN 24* 19 25*  CREATININE 1.55* 1.64* 1.50*  CALCIUM 9.4 9.5 8.9   GFR: Estimated Creatinine Clearance: 64.2 mL/min (A) (by C-G formula based on SCr of 1.5 mg/dL (H)). Liver Function Tests: Recent Labs  Lab 02/16/17 0009  AST 23  ALT 17  ALKPHOS 74  BILITOT 1.1  PROT 7.3  ALBUMIN 3.2*   No results for  input(s): LIPASE, AMYLASE in the last 168 hours. No results for input(s): AMMONIA in the last 168 hours. Coagulation Profile: No results for input(s): INR, PROTIME in the last 168 hours. Cardiac Enzymes: Recent Labs  Lab 02/16/17 2219  TROPONINI <0.03   BNP (last 3 results) No results for input(s): PROBNP in the last 8760 hours. HbA1C: No results for input(s): HGBA1C in the last 72 hours. CBG:  Recent Labs  Lab 02/16/17 2308 02/17/17 0643 02/17/17 1206  GLUCAP 192* 111* 146*   Lipid Profile: No results for input(s): CHOL, HDL, LDLCALC, TRIG, CHOLHDL, LDLDIRECT in the last 72 hours. Thyroid Function Tests: No results for input(s): TSH, T4TOTAL, FREET4, T3FREE, THYROIDAB in the last 72 hours. Anemia Panel: No results for input(s): VITAMINB12, FOLATE, FERRITIN, TIBC, IRON, RETICCTPCT in the last 72 hours. Urine analysis:    Component Value Date/Time   COLORURINE YELLOW 02/16/2017 [place] 02/16/2017 0457   LABSPEC 1.020 02/16/2017 0457   PHURINE 5.0 02/16/2017 0457   GLUCOSEU NEGATIVE 02/16/2017 0457   HGBUR MODERATE (A) 02/16/2017 0457   BILIRUBINUR NEGATIVE 02/16/2017 0457   BILIRUBINUR neg 08/27/2015 1458   KETONESUR NEGATIVE 02/16/2017 0457   PROTEINUR NEGATIVE 02/16/2017 0457   UROBILINOGEN 0.2 08/27/2015 1458   UROBILINOGEN 0.2 01/14/2013 1032   NITRITE NEGATIVE 02/16/2017 0457   LEUKOCYTESUR NEGATIVE 02/16/2017 0457     Ripudeep Rai M.D. [facility] 02/17/2017, 1:04 PM  Pager: [phone] Between 7am to 7pm - call Pager - [phone]  After 7pm go to [email or web address] - password [email or web address]  Call night coverage person covering after 7pm

## 2017-02-18 ENCOUNTER — Encounter (HOSPITAL_COMMUNITY): Payer: Self-pay | Admitting: *Deleted

## 2017-02-18 LAB — CBC
HCT: 38.7 % — ABNORMAL LOW (ref 39.0–52.0)
Hemoglobin: 13 g/dL (ref 13.0–17.0)
MCH: 28.3 pg (ref 26.0–34.0)
MCHC: 33.6 g/dL (ref 30.0–36.0)
MCV: 84.3 fL (ref 78.0–100.0)
PLATELETS: 340 10*3/uL (ref 150–400)
RBC: 4.59 MIL/uL (ref 4.22–5.81)
RDW: 12.3 % (ref 11.5–15.5)
WBC: 14.1 10*3/uL — ABNORMAL HIGH (ref 4.0–10.5)

## 2017-02-18 LAB — BASIC METABOLIC PANEL
Anion gap: 10 (ref 5–15)
BUN: 25 mg/dL — AB (ref 6–20)
CALCIUM: 8.8 mg/dL — AB (ref 8.9–10.3)
CHLORIDE: 100 mmol/L — AB (ref 101–111)
CO2: 29 mmol/L (ref 22–32)
CREATININE: 1.47 mg/dL — AB (ref 0.61–1.24)
GFR calc non Af Amer: 46 mL/min — ABNORMAL LOW (ref >60)
GFR, EST AFRICAN AMERICAN: 54 mL/min — AB (ref >60)
GLUCOSE: 109 mg/dL — AB (ref 65–99)
Potassium: 3.4 mmol/L — ABNORMAL LOW (ref 3.5–5.1)
Sodium: 139 mmol/L (ref 135–145)

## 2017-02-18 LAB — GLUCOSE, CAPILLARY
GLUCOSE-CAPILLARY: 84 mg/dL (ref 65–99)
Glucose-Capillary: 124 mg/dL — ABNORMAL HIGH (ref 65–99)
Glucose-Capillary: 142 mg/dL — ABNORMAL HIGH (ref 65–99)

## 2017-02-18 MED ORDER — POTASSIUM CHLORIDE CRYS ER 20 MEQ PO TBCR
40.0000 meq | EXTENDED_RELEASE_TABLET | Freq: Once | ORAL | Status: AC
  Administered 2017-02-18: 40 meq via ORAL
  Filled 2017-02-18: qty 2

## 2017-02-18 NOTE — Progress Notes (Addendum)
Pharmacy Antibiotic Note  Travis Daniels is a 71 y.o. male admitted on 02/15/2017 with LLE cellulitis.  Pharmacy has been consulted for Ancef dosing. Likely gout attack and on treatment with allopurinol, colchicine, and prednisone. SCr stable 1.47-1.64, est CrCl ~ 65 ml/min. No cultures.  Plan: Continue Ancef 2g IV q8h Recommend 5 days treatment Pharmacy signing off but will adjust dose for renal function if needed  Height: 5\' 11"  (180.3 cm) Weight: (!) 305 lb (138.3 kg) IBW/kg (Calculated) : 75.3  Temp (24hrs), Avg:98.5 F (36.9 C), Min:98.2 F (36.8 C), Max:98.8 F (37.1 C)  Recent Labs  Lab 02/15/17 1316 02/15/17 1526 02/16/17 0009 02/17/17 0518 02/18/17 0444  WBC  --  12.5* 13.4* 13.9* 14.1*  CREATININE  --  1.55* 1.64* 1.50* 1.47*  LATICACIDVEN 1.85  --   --   --   --     Estimated Creatinine Clearance: 65.5 mL/min (A) (by C-G formula based on SCr of 1.47 mg/dL (H)).    No Known Allergies   Renold Genta, PharmD, BCPS Clinical Pharmacist Phone for today - [place] - [phone] 02/18/2017 9:47 AM

## 2017-02-18 NOTE — Progress Notes (Signed)
[facility]                                                                              Patient Demographics  Travis Daniels, is a 71 y.o. male, DOB - 07-28-1945, QBH:[id number]  Admit date - 02/15/2017   Admitting Physician Kayleen Memos, DO  Outpatient Primary MD for the patient is Carollee Herter, [name], DO  Outpatient specialists:   LOS - 3  days   Medical records reviewed and are as summarized below:    Chief Complaint  Patient presents with  . Cellulitis       Brief summary   Per admit note by Dr Maudie Mercury on 12/6  Travis Daniels  is a 71 y.o. male, w prostate cancer, Copd, pulm hypertension, b12 deficiency who apparently c/o swelling of the left ankle starting last Wednesday.  Pt notes severe pain in the ankle as well, no hx of trauma. And also now the left knee.  There is redness of the left distal lower ext as well.  Pt denies cp, palp, sob, n/v, diarrhea, brbpr, black stool.     Assessment & Plan    Principal Problem:   Cellulitis left ankle, acute gout -Continue IV Ancef for now, transition to oral antibiotics in a.m. -Uric acid 10.3, CRP elevated 18.9, ESR 67  Active Problems: Left knee effusion, acute gout -Patient has a known history of osteoarthritis in the left knee however appears to have joint effusion and tenderness -Doppler ultrasound of the left lower extremity negative for DVT -Followed by orthopedics, appreciate recommendations.   -Once swelling is improved, compression hose would be helpful  -Gout pain improving, continue allopurinol, colchicine and prednisone  Elevated d-dimer -Doppler ultrasound of the LLE negative for DVT -VQ scan negative for PE    Diabetes mellitus ([place]) -CBGs well controlled, continue sliding scale insulin, mild hyperglycemia due to steroids.   Chronic kidney disease stage III, baseline creatinine 1.4-1.6 -Follow creatinine function, creatinine at baseline currently   obesity BMI 42.5, counseled  patient on diet and weight control  Hypokalemia -Replaced  Code Status: Full CODE STATUS DVT Prophylaxis:  Lovenox  Family Communication: Discussed in detail with the patient, all imaging results, lab results explained to the patient   Disposition Plan: DC in a.m.  Time Spent in minutes   5mnutes  Procedures:    Consultants:   Orthopedics  Antimicrobials:   IV Ancef 12/6   Medications  Scheduled Meds: . allopurinol  100 mg Oral Daily  . aspirin EC  81 mg Oral Daily  . colchicine  0.6 mg Oral Daily  . diltiazem  300 mg Oral Daily  . enoxaparin (LOVENOX) injection  0.5 mg/kg Subcutaneous Q24H  . irbesartan  75 mg Oral Daily  . predniSONE  60 mg Oral Q breakfast  . tiotropium  18 mcg Inhalation Daily   Continuous Infusions: .  ceFAZolin (ANCEF) IV Stopped (02/18/17 0606)   PRN Meds:.albuterol   Antibiotics   Anti-infectives (From admission, onward)   Start     Dose/Rate Route Frequency Ordered Stop   02/15/17 2345  ceFAZolin (ANCEF) IVPB 2g/100 mL premix  2 g 200 mL/hr over 30 Minutes Intravenous Every 8 hours 02/15/17 2338     02/15/17 1545  ceFAZolin (ANCEF) IVPB 1 g/50 mL premix     1 g 100 mL/hr over 30 Minutes Intravenous  Once 02/15/17 1530 02/15/17 1712        Subjective:   Travis Daniels was seen and examined today.  Pain in the left ankle and left knee improving, able to ambulate on the left leg.  Swelling improving, no fevers.  Patient denies dizziness, chest pain, shortness of breath, abdominal pain, N/V/D/C, new weakness, numbess, tingling.   Objective:   Vitals:   02/17/17 2130 02/18/17 0706 02/18/17 0829 02/18/17 1045  BP: 127/70 (!) 144/94  128/71  Pulse: 67 60    Resp: 18 18    Temp: 98.4 F (36.9 C) 98.2 F (36.8 C)    TempSrc: Oral Oral    SpO2: 96% 97% 93%   Weight:      Height:       No intake or output data in the 24 hours ending 02/18/17 1121   Wt Readings from Last 3 Encounters:  02/15/17 (!) 138.3 kg (305 lb)    02/12/17 (!) 138.7 kg (305 lb 12.8 oz)  02/09/17 (!) 138.7 kg (305 lb 12.8 oz)     Exam   General: Alert and oriented x 3, NAD  Eyes:   HEENT:    Cardiovascular: S1 S2 auscultated, no rubs, murmurs or gallops. Regular rate and rhythm.  Left ankle edema  Respiratory: Clear to auscultation bilaterally, no wheezing, rales or rhonchi  Gastrointestinal: Soft, nontender, nondistended, + bowel sounds  Ext: left ankle and left knee swelling improving  Neuro: no new deficits  Musculoskeletal: No digital cyanosis, clubbing  Skin: No rashes  Psych: Normal affect and demeanor, alert and oriented x3    Data Reviewed:  I have personally reviewed following labs and imaging studies  Micro Results No results found for this or any previous visit (from the past 240 hour(s)).  Radiology Reports Dg Chest 2 View  Result Date: 02/16/2017 CLINICAL DATA:  History are cellulitis and COPD. EXAM: CHEST  2 VIEW COMPARISON:  01/23/2013 FINDINGS: Enlarged cardiac silhouette. Chronic elevation of the left hemidiaphragm. Left lower lobe airspace consolidation versus atelectasis. Osseous structures are without acute abnormality. Soft tissues are grossly normal. IMPRESSION: Chronic elevation of the left hemidiaphragm. Left lower lobe airspace consolidation versus atelectasis. Electronically Signed   By: Fidela Salisbury M.D.   On: 02/16/2017 03:01   Nm Pulmonary Perf And Vent  Result Date: 02/16/2017 CLINICAL DATA:  History of pulmonary artery hypertension. Lower extremity edema. Chronic difficulty breathing EXAM: NUCLEAR MEDICINE VENTILATION - PERFUSION LUNG SCAN VIEWS: Anterior, posterior, left lateral, right lateral, RPO, LPO, RAO, LAO -ventilation and perffusion RADIOPHARMACEUTICALS:  32.0 mCi Technetium-40mDTPA aerosol inhalation and 4.29 mCi Technetium-947mAA IV COMPARISON:  Chest radiograph February 15, 2017 FINDINGS: Ventilation: There is decreased uptake in the left lung compared to the right.  Chest radiograph shows elevation of the left hemidiaphragm with relative volume loss on the left compared to the right. There is no focal segmental or significant subsegmental ventilation defect. Perfusion: Decreased uptake is noted in the left base at site of elevation of left hemidiaphragm and relative volume loss the left compared to the right, matching the ventilation defect. There are occasional subsegmental perfusion defects which match ventilation defects. There is no appreciable ventilation/ perfusion mismatch. No segmental or significant subsegmental perfusion defect evident. IMPRESSION: Matching decreased uptake in the  left base at the site of elevation of the left hemidiaphragm and volume loss on chest radiograph. No segmental or significant subsegmental perfusion defects evident. No appreciable ventilation/perfusion mismatch. This study overall constitutes a low probability of pulmonary embolus. Electronically Signed   By: Lowella Grip III M.D.   On: 02/16/2017 14:44   US Venous Img Lower Unilateral Left  Result Date: 02/15/2017 CLINICAL DATA:  Left lower extremity swelling. EXAM: Left LOWER EXTREMITY VENOUS DOPPLER ULTRASOUND TECHNIQUE: Gray-scale sonography with graded compression, as well as color Doppler and duplex ultrasound were performed to evaluate the lower extremity deep venous systems from the level of the common femoral vein and including the common femoral, femoral, profunda femoral, popliteal and calf veins including the posterior tibial, peroneal and gastrocnemius veins when visible. The superficial great saphenous vein was also interrogated. Spectral Doppler was utilized to evaluate flow at rest and with distal augmentation maneuvers in the common femoral, femoral and popliteal veins. COMPARISON:  None. FINDINGS: Contralateral Common Femoral Vein: Respiratory phasicity is normal and symmetric with the symptomatic side. No evidence of thrombus. Normal compressibility. Common  Femoral Vein: No evidence of thrombus. Normal compressibility, respiratory phasicity and response to augmentation. Saphenofemoral Junction: No evidence of thrombus. Normal compressibility and flow on color Doppler imaging. Profunda Femoral Vein: No evidence of thrombus. Normal compressibility and flow on color Doppler imaging. Femoral Vein: No evidence of thrombus. Normal compressibility, respiratory phasicity and response to augmentation. Popliteal Vein: No evidence of thrombus. Normal compressibility, respiratory phasicity and response to augmentation. Calf Veins: Peroneal vein is not visualized. No definite thrombus is noted in posterior tibial vein. Superficial Great Saphenous Vein: Greater saphenous vein and small saphenous vein appear to be thrombosed which may be due to prior ablation. Venous Reflux:  None. Other Findings:  Multiple superficial varicosities are noted. IMPRESSION: No evidence of deep venous thrombosis seen in left lower extremity. Left greater and small saphenous veins appear to be thrombosed which may be due to prior ablation. Electronically Signed   By: Marijo Conception, M.D.   On: 02/15/2017 14:20    Lab Data:  CBC: Recent Labs  Lab 02/15/17 1526 02/16/17 0009 02/17/17 0518 02/18/17 0444  WBC 12.5* 13.4* 13.9* 14.1*  NEUTROABS 9.9*  --   --   --   HGB 14.6 15.1 13.3 13.0  HCT 43.4 44.1 39.7 38.7*  MCV 83.8 85.5 84.5 84.3  PLT 303 294 306 540   Basic Metabolic Panel: Recent Labs  Lab 02/15/17 1526 02/16/17 0009 02/17/17 0518 02/18/17 0444  NA 136 137 139 139  K 3.3* 3.5 3.6 3.4*  CL 97* 93* 98* 100*  CO2 31 33* 33* 29  GLUCOSE 101* 125* 113* 109*  BUN 24* 19 25* 25*  CREATININE 1.55* 1.64* 1.50* 1.47*  CALCIUM 9.4 9.5 8.9 8.8*   GFR: Estimated Creatinine Clearance: 65.5 mL/min (A) (by C-G formula based on SCr of 1.47 mg/dL (H)). Liver Function Tests: Recent Labs  Lab 02/16/17 0009  AST 23  ALT 17  ALKPHOS 74  BILITOT 1.1  PROT 7.3  ALBUMIN 3.2*    No results for input(s): LIPASE, AMYLASE in the last 168 hours. No results for input(s): AMMONIA in the last 168 hours. Coagulation Profile: No results for input(s): INR, PROTIME in the last 168 hours. Cardiac Enzymes: Recent Labs  Lab 02/16/17 2219  TROPONINI <0.03   BNP (last 3 results) No results for input(s): PROBNP in the last 8760 hours. HbA1C: No results for input(s): HGBA1C in the last  72 hours. CBG: Recent Labs  Lab 02/17/17 0643 02/17/17 1206 02/17/17 1612 02/17/17 2104 02/18/17 0709  GLUCAP 111* 146* 166* 103* 84   Lipid Profile: No results for input(s): CHOL, HDL, LDLCALC, TRIG, CHOLHDL, LDLDIRECT in the last 72 hours. Thyroid Function Tests: No results for input(s): TSH, T4TOTAL, FREET4, T3FREE, THYROIDAB in the last 72 hours. Anemia Panel: No results for input(s): VITAMINB12, FOLATE, FERRITIN, TIBC, IRON, RETICCTPCT in the last 72 hours. Urine analysis:    Component Value Date/Time   COLORURINE YELLOW 02/16/2017 [place] 02/16/2017 0457   LABSPEC 1.020 02/16/2017 0457   PHURINE 5.0 02/16/2017 0457   GLUCOSEU NEGATIVE 02/16/2017 0457   HGBUR MODERATE (A) 02/16/2017 0457   BILIRUBINUR NEGATIVE 02/16/2017 0457   BILIRUBINUR neg 08/27/2015 1458   KETONESUR NEGATIVE 02/16/2017 0457   PROTEINUR NEGATIVE 02/16/2017 0457   UROBILINOGEN 0.2 08/27/2015 1458   UROBILINOGEN 0.2 01/14/2013 1032   NITRITE NEGATIVE 02/16/2017 0457   LEUKOCYTESUR NEGATIVE 02/16/2017 0457     Travis Daniels M.D. [facility] 02/18/2017, 11:21 AM  Pager: [phone] Between 7am to 7pm - call Pager - [phone]  After 7pm go to [email or web address] - password [email or web address]  Call night coverage person covering after 7pm

## 2017-02-18 NOTE — Progress Notes (Signed)
Pt stated that he will place self on CPAP once he is ready for bed. Education given on the proper use of the machine. Humidity provided.

## 2017-02-19 LAB — BASIC METABOLIC PANEL
ANION GAP: 7 (ref 5–15)
BUN: 25 mg/dL — AB (ref 6–20)
CO2: 32 mmol/L (ref 22–32)
Calcium: 8.9 mg/dL (ref 8.9–10.3)
Chloride: 101 mmol/L (ref 101–111)
Creatinine, Ser: 1.47 mg/dL — ABNORMAL HIGH (ref 0.61–1.24)
GFR calc Af Amer: 54 mL/min — ABNORMAL LOW (ref >60)
GFR calc non Af Amer: 46 mL/min — ABNORMAL LOW (ref >60)
Glucose, Bld: 109 mg/dL — ABNORMAL HIGH (ref 65–99)
POTASSIUM: 3.8 mmol/L (ref 3.5–5.1)
SODIUM: 140 mmol/L (ref 135–145)

## 2017-02-19 LAB — GLUCOSE, CAPILLARY: Glucose-Capillary: 95 mg/dL (ref 65–99)

## 2017-02-19 LAB — CBC
HEMATOCRIT: 39.4 % (ref 39.0–52.0)
HEMOGLOBIN: 13.2 g/dL (ref 13.0–17.0)
MCH: 28.3 pg (ref 26.0–34.0)
MCHC: 33.5 g/dL (ref 30.0–36.0)
MCV: 84.4 fL (ref 78.0–100.0)
Platelets: 365 10*3/uL (ref 150–400)
RBC: 4.67 MIL/uL (ref 4.22–5.81)
RDW: 12.3 % (ref 11.5–15.5)
WBC: 13.1 10*3/uL — ABNORMAL HIGH (ref 4.0–10.5)

## 2017-02-19 MED ORDER — DOXYCYCLINE HYCLATE 100 MG PO TABS: 100.0000 mg | ORAL_TABLET | Freq: Two times a day (BID) | ORAL | 0 refills | Status: DC

## 2017-02-19 MED ORDER — DOXYCYCLINE HYCLATE 100 MG PO TABS
100.0000 mg | ORAL_TABLET | Freq: Two times a day (BID) | ORAL | Status: DC
  Administered 2017-02-19: 100 mg via ORAL
  Filled 2017-02-19 (×2): qty 1

## 2017-02-19 MED ORDER — PREDNISONE 10 MG PO TABS: ORAL_TABLET | ORAL | 0 refills | Status: DC

## 2017-02-19 MED ORDER — COLCHICINE 0.6 MG PO TABS: 0.6000 mg | ORAL_TABLET | Freq: Every day | ORAL | 3 refills | Status: DC

## 2017-02-19 NOTE — Care Management (Signed)
Case managerspoke with patient concerning discharge plan. Patient will need ambulance transport home. Unable to weight bear on left foot. Case manager faxed his prescriptions to [facility] on [address] [phone], Fax:[phone]. Patient will have someone pick up scripts later. CM called [facility] for transportation home.

## 2017-02-19 NOTE — Discharge Summary (Signed)
Physician Discharge Summary   [name] MRN: [id number] DOB/AGE: 07/11/1945 71 y.o.  Admit date: 02/15/2017 Discharge date: 02/19/2017  Primary Care Physician:  Travis Held, DO  Discharge Diagnoses:    Cellulitis left ankle Acute gout in the left ankle, left knee Diabetes mellitus Chronic kidney disease stage III Obesity, BMI 42.5 Hypokalemia  Consults: Orthopedics  Recommendations for Outpatient Follow-up:  1. Please repeat CBC/BMET at next visit   DIET: Carb modified diet    Allergies:  No Known Allergies   DISCHARGE MEDICATIONS: Allergies as of 02/19/2017   No Known Allergies     Medication List    STOP taking these medications   chlorthalidone 25 MG tablet Commonly known as:  HYGROTON   clindamycin 300 MG capsule Commonly known as:  CLEOCIN   ibuprofen 200 MG tablet Commonly known as:  ADVIL,MOTRIN     TAKE these medications   albuterol 108 (90 Base) MCG/ACT inhaler Commonly known as:  PROVENTIL HFA;VENTOLIN HFA Inhale 2 puffs into the lungs every 6 (six) hours as needed for wheezing or shortness of breath.   allopurinol 100 MG tablet Commonly known as:  ZYLOPRIM Take 1 tablet (100 mg total) by mouth daily.   aspirin EC 81 MG tablet Take 81 mg by mouth daily.   colchicine 0.6 MG tablet Take 1 tablet (0.6 mg total) by mouth daily. Start taking on:  02/20/2017   diltiazem 300 MG 24 hr capsule Commonly known as:  CARDIZEM CD Take 1 capsule (300 mg total) by mouth daily.   doxycycline 100 MG tablet Commonly known as:  VIBRA-TABS Take 1 tablet (100 mg total) by mouth 2 (two) times daily. X 1 week   GARLIC PO Take 1 capsule by mouth daily.   glucose blood test strip Commonly known as:  ONE TOUCH ULTRA TEST CHECK BLOOD SUGAR TWICE DAILY.   DX Code: E11.9   KRILL OIL PO Take 1 capsule by mouth daily.   olmesartan 20 MG tablet Commonly known as:  BENICAR Take 1 tablet (20 mg total) by mouth daily.    ONETOUCH DELICA LANCETS FINE Misc Check blood sugar daily   predniSONE 10 MG tablet Commonly known as:  DELTASONE Prednisone dosing: Take  Prednisone 25m (4 tabs) x 2 days, then taper to 318m(3 tabs) x 2 days, then 2076m2 tabs) x 2days, then 95m85m tab) x 2days, then OFF.   tiotropium 18 MCG inhalation capsule Commonly known as:  SPIRIVA Place 18 mcg into inhaler and inhale daily.        Brief H and P: For complete details please refer to admission H and P, but in brief Per admit note by Dr Kim Maudie Mercury12/6  RobertParsellsis a71 y.o.male,w prostate cancer, Copd, pulm hypertension, b12 deficiency who apparently c/o swelling of the left ankle starting last Wednesday. Pt notes severe pain in the ankle as well, no hx of trauma. And also now the left knee. There is redness of the left distal lower ext as well. Pt denies cp, palp, sob, n/v, diarrhea, brbpr, black stool.   Hospital Course:  Cellulitis left ankle, acute gout -Patient was placed on IV Ancef for cellulitis, significantly improved, transitioned to oral doxycycline for 1 week -Uric acid 10.3, CRP elevated 18.9, ESR 67  Left knee effusion, acute gout -Patient has a known history of osteoarthritis in the left knee however appeared to have joint effusion and tenderness -Doppler ultrasound of the left lower extremity negative for DVT -Orthopedics was  consulted, no suspicion for septic arthritis.  Patient had acute gout, was placed on allopurinol colchicine and prednisone which significantly improved his symptoms.   Continue prednisone taper, allopurinol and colchicine. -Follow BMET outpatient -Once swelling is improved, compression hose would be helpful   Elevated d-dimer -Doppler ultrasound of the LLE negative for DVT -VQ scan negative for PE    Diabetes mellitus ([place]) -CBGs well controlled, patient was placed on sliding scale insulin due to mild hyperglycemia from steroids  Chronic kidney disease stage III,  baseline creatinine 1.4-1.6 -Creatinine function at baseline, 1.4   obesity BMI 42.5, counseled patient on diet and weight control  Hypokalemia -Was replaced, currently 3.8 at the time of discharge     Day of Discharge BP (!) 148/87   Pulse (!) 46   Temp 98.8 F (37.1 C) (Oral)   Resp (!) 22   Ht 5' 11"  (1.803 m)   Wt (!) 138.3 kg (305 lb)   SpO2 95%   BMI 42.54 kg/m   Physical Exam: General: Alert and awake oriented x3 not in any acute distress. HEENT: anicteric sclera, pupils reactive to light and accommodation CVS: S1-S2 clear no murmur rubs or gallops Chest: clear to auscultation bilaterally, no wheezing rales or rhonchi Abdomen: soft nontender, nondistended, normal bowel sounds Extremities: no cyanosis, clubbing or edema noted bilaterally, left ankle swelling has improved Neuro: Cranial nerves II-XII intact, no focal neurological deficits   The results of significant diagnostics from this hospitalization (including imaging, microbiology, ancillary and laboratory) are listed below for reference.    LAB RESULTS: Basic Metabolic Panel: Recent Labs  Lab 02/18/17 0444 02/19/17 0330  NA 139 140  K 3.4* 3.8  CL 100* 101  CO2 29 32  GLUCOSE 109* 109*  BUN 25* 25*  CREATININE 1.47* 1.47*  CALCIUM 8.8* 8.9   Liver Function Tests: Recent Labs  Lab 02/16/17 0009  AST 23  ALT 17  ALKPHOS 74  BILITOT 1.1  PROT 7.3  ALBUMIN 3.2*   No results for input(s): LIPASE, AMYLASE in the last 168 hours. No results for input(s): AMMONIA in the last 168 hours. CBC: Recent Labs  Lab 02/15/17 1526  02/18/17 0444 02/19/17 0330  WBC 12.5*   < > 14.1* 13.1*  NEUTROABS 9.9*  --   --   --   HGB 14.6   < > 13.0 13.2  HCT 43.4   < > 38.7* 39.4  MCV 83.8   < > 84.3 84.4  PLT 303   < > 340 365   < > = values in this interval not displayed.   Cardiac Enzymes: Recent Labs  Lab 02/16/17 2219  TROPONINI <0.03   BNP: Invalid input(s): POCBNP CBG: Recent Labs  Lab  02/18/17 1601 02/19/17 0618  GLUCAP 142* 95    Significant Diagnostic Studies:  Dg Chest 2 View  Result Date: 02/16/2017 CLINICAL DATA:  History are cellulitis and COPD. EXAM: CHEST  2 VIEW COMPARISON:  01/23/2013 FINDINGS: Enlarged cardiac silhouette. Chronic elevation of the left hemidiaphragm. Left lower lobe airspace consolidation versus atelectasis. Osseous structures are without acute abnormality. Soft tissues are grossly normal. IMPRESSION: Chronic elevation of the left hemidiaphragm. Left lower lobe airspace consolidation versus atelectasis. Electronically Signed   By: Fidela Salisbury M.D.   On: 02/16/2017 03:01   Nm Pulmonary Perf And Vent  Result Date: 02/16/2017 CLINICAL DATA:  History of pulmonary artery hypertension. Lower extremity edema. Chronic difficulty breathing EXAM: NUCLEAR MEDICINE VENTILATION - PERFUSION LUNG SCAN VIEWS: Anterior, posterior,  left lateral, right lateral, RPO, LPO, RAO, LAO -ventilation and perffusion RADIOPHARMACEUTICALS:  32.0 mCi Technetium-79mDTPA aerosol inhalation and 4.29 mCi Technetium-955mAA IV COMPARISON:  Chest radiograph February 15, 2017 FINDINGS: Ventilation: There is decreased uptake in the left lung compared to the right. Chest radiograph shows elevation of the left hemidiaphragm with relative volume loss on the left compared to the right. There is no focal segmental or significant subsegmental ventilation defect. Perfusion: Decreased uptake is noted in the left base at site of elevation of left hemidiaphragm and relative volume loss the left compared to the right, matching the ventilation defect. There are occasional subsegmental perfusion defects which match ventilation defects. There is no appreciable ventilation/ perfusion mismatch. No segmental or significant subsegmental perfusion defect evident. IMPRESSION: Matching decreased uptake in the left base at the site of elevation of the left hemidiaphragm and volume loss on chest radiograph. No  segmental or significant subsegmental perfusion defects evident. No appreciable ventilation/perfusion mismatch. This study overall constitutes a low probability of pulmonary embolus. Electronically Signed   By: WiLowella GripII M.D.   On: 02/16/2017 14:44   UsKoreaenous Img Lower Unilateral Left  Result Date: 02/15/2017 CLINICAL DATA:  Left lower extremity swelling. EXAM: Left LOWER EXTREMITY VENOUS DOPPLER ULTRASOUND TECHNIQUE: Gray-scale sonography with graded compression, as well as color Doppler and duplex ultrasound were performed to evaluate the lower extremity deep venous systems from the level of the common femoral vein and including the common femoral, femoral, profunda femoral, popliteal and calf veins including the posterior tibial, peroneal and gastrocnemius veins when visible. The superficial great saphenous vein was also interrogated. Spectral Doppler was utilized to evaluate flow at rest and with distal augmentation maneuvers in the common femoral, femoral and popliteal veins. COMPARISON:  None. FINDINGS: Contralateral Common Femoral Vein: Respiratory phasicity is normal and symmetric with the symptomatic side. No evidence of thrombus. Normal compressibility. Common Femoral Vein: No evidence of thrombus. Normal compressibility, respiratory phasicity and response to augmentation. Saphenofemoral Junction: No evidence of thrombus. Normal compressibility and flow on color Doppler imaging. Profunda Femoral Vein: No evidence of thrombus. Normal compressibility and flow on color Doppler imaging. Femoral Vein: No evidence of thrombus. Normal compressibility, respiratory phasicity and response to augmentation. Popliteal Vein: No evidence of thrombus. Normal compressibility, respiratory phasicity and response to augmentation. Calf Veins: Peroneal vein is not visualized. No definite thrombus is noted in posterior tibial vein. Superficial Great Saphenous Vein: Greater saphenous vein and small saphenous vein  appear to be thrombosed which may be due to prior ablation. Venous Reflux:  None. Other Findings:  Multiple superficial varicosities are noted. IMPRESSION: No evidence of deep venous thrombosis seen in left lower extremity. Left greater and small saphenous veins appear to be thrombosed which may be due to prior ablation. Electronically Signed   By: JaMarijo ConceptionM.D.   On: 02/15/2017 14:20    2D ECHO:   Disposition and Follow-up: Discharge Instructions    Diet - low sodium heart healthy   Complete by:  As directed    Increase activity slowly   Complete by:  As directed        DISPOSITION: Home   DISCHARGE FOLLOW-UP Follow-up Information    LoAnn HeldDO. Schedule an appointment as soon as possible for a visit in 2 week(s).   Specialty:  Family Medicine Contact information: [phone]. [facility] [phone]          Time spent on Discharge:  78mns   Signed:   REstill CottaM.D. [facility] 02/19/2017, 10:36 AM Pager: [phone]

## 2017-02-19 NOTE — Progress Notes (Signed)
Patient ID: JLON BETKER, male   DOB: Aug 03, 1945, 71 y.o.   MRN: [id number] Swelling continues to improve left leg, he is able to stand and ambulate with the left leg. I spoke with Mr. Kibbe and [facility] that would allow him to elevate the legs And also assist with positioning in bed due to COPD and OSA.  Low purine diet and avoid alcohol to decrease risk of gout episodes, allopurinol.

## 2017-02-21 ENCOUNTER — Telehealth: Payer: Self-pay

## 2017-02-21 NOTE — Telephone Encounter (Signed)
02/21/17   TCM Hospital Follow Up  Note: Patient to have labwork on date of HFU  CBC/BMET  Transition Care Management Follow-up Telephone Call  ADMISSION DATE: 02/15/17  DISCHARGE DATE: 12/10118   How have you been since you were released from the hospital? Patient states he has been doing well. Just cant take medication for knee pain.    Do you understandwhy you were in the hospital? Yes   Do you understand the discharge instrcutions? Yes    Items Reviewed:  Medications reviewed:  Yes states because of snow he couldn't pick then up until yesterday.   Allergies reviewed: NKDA   Dietary changes reviewed: Low sodium Heart healthy. Modified Carbohydrates   Referrals reviewed: Appointment scheduled for follow up with Dr. Lawson Radar on 03/01/17    Functional Questionnaire:   Activities of Daily Living (ADLs): Able to perform al independently  Any patient concerns? None at this time   Confirmed importance and date/time of follow-up visits scheduled: Yes   Confirmed with patient if condition begins to worsen call PCP or go to the ER. Yes    Patient was given the office number and encouragred to call back with questions or concerns. Yes

## 2017-03-01 ENCOUNTER — Ambulatory Visit (INDEPENDENT_AMBULATORY_CARE_PROVIDER_SITE_OTHER): Payer: Medicare Other | Admitting: Family Medicine

## 2017-03-01 VITALS — BP 142/72 | Temp 98.1°F | Ht 73.0 in | Wt 309.0 lb

## 2017-03-01 DIAGNOSIS — L0390 Cellulitis, unspecified: Secondary | ICD-10-CM | POA: Diagnosis not present

## 2017-03-01 DIAGNOSIS — M109 Gout, unspecified: Secondary | ICD-10-CM

## 2017-03-01 DIAGNOSIS — E1169 Type 2 diabetes mellitus with other specified complication: Secondary | ICD-10-CM | POA: Diagnosis not present

## 2017-03-01 DIAGNOSIS — I10 Essential (primary) hypertension: Secondary | ICD-10-CM

## 2017-03-01 DIAGNOSIS — E785 Hyperlipidemia, unspecified: Secondary | ICD-10-CM | POA: Diagnosis not present

## 2017-03-01 NOTE — Progress Notes (Signed)
Subjective:  I acted as a Education administrator for [employer], [place]   [name], male    DOB: 06/20/1945, 71 y.o.   MRN: [id number]  Chief Complaint  Patient presents with  . Follow-up    HPI  Patient is in today for follow up from hosp for cellulitis and gout.  He is doing much better.  We will recheck labs today.  Pt was admitted for IV abx.   Patient Care Team: Carollee Herter, [name], DO as PCP - General (Family Medicine) Donato Heinz, MD as Consulting Physician (Nephrology)   Past Medical History:  Diagnosis Date  . Arthritis   . B12 deficiency   . COPD (chronic obstructive pulmonary disease) ([place]) 01/2011   FVC 47%, FEV1 49%   . 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 Family history of breast cancer   . Family history of colon cancer   . Family history of pancreatic cancer   . Family history of prostate cancer   . Family history of renal cancer   . Hyperlipidemia   . Hypertension   . Lower extremity edema   . Prostate cancer ([place])   . Secondary pulmonary hypertension 01/2011   RV syst pressure 30-40 mm Hg on ECHO     Past Surgical History:  Procedure Laterality Date  . 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 Ronelle Nigh at [facility] in [place], [place])  . ENDOVENOUS ABLATION SAPHENOUS VEIN W/ LASER Left 09/18/2016   endovenous laser ablation left greater saphenous vein by Tinnie Gens MD   . ENDOVENOUS ABLATION SAPHENOUS VEIN W/ LASER Left 10/09/2016   endovenous laser ablation left small saphenous vein by Tinnie Gens MD   . FINGER SURGERY Right 11   rt middle finger  . PROSTATE BIOPSY    . TOTAL SHOULDER ARTHROPLASTY Right 01/21/2013   Procedure: SHOULDER HEMI ARTHROPLASTY;  Surgeon: Garald Balding, MD;  Location: [facility];  Service: Orthopedics;  Laterality: Right;  . TRANSTHORACIC ECHOCARDIOGRAM  01/2011   EF 55-60%, mild  asymmetric LVH, elevated RV systolic pressure  . VASECTOMY      Family History  Problem Relation Age of Onset  . CAD Mother        MI in her 47s  . Skin cancer Mother        multiple skin cancers removed from face and head  . Cancer Father        colon and pancreas  . Epilepsy Son   . Cancer Maternal Grandmother 65       "stomach"  . Cancer Maternal Grandfather        prostate; died at the age of 61  . Cancer Cousin        paternal first cousin kidney dx under 75  . Breast cancer Cousin        maternal first cousin with breast cancer over 44    Social History   Socioeconomic History  . Marital status: Married    Spouse name: Not on file  . Number of children: 4  . Years of education: Not on file  . Highest education level: Not on file  Social Needs  . Financial resource strain: Not on file  . Food insecurity - worry: Not on file  . Food insecurity - inability: Not on file  . Transportation needs - medical: Not on file  . Transportation  needs - non-medical: Not on file  Occupational History  . Occupation: retired  Tobacco Use  . Smoking status: Never Smoker  . Smokeless tobacco: Never Used  . Tobacco comment: occ alcohol  Substance and Sexual Activity  . Alcohol use: Yes    Alcohol/week: 0.0 oz    Comment: 2-3 drinks occasionally  . Drug use: No  . Sexual activity: Yes  Other Topics Concern  . Not on file  Social History Narrative   Mr. Wadlow lives with his wife in [place]. He is recently retired and relocated to [facility] from [place]. Historically, his medical provider has been [facility] in [place].    Outpatient Medications Prior to Visit  Medication Sig Dispense Refill  . albuterol (PROVENTIL HFA;VENTOLIN HFA) 108 (90 BASE) MCG/ACT inhaler Inhale 2 puffs into the lungs every 6 (six) hours as needed for wheezing or shortness of breath.    . allopurinol (ZYLOPRIM) 100 MG tablet Take 1 tablet (100 mg total) by mouth daily. 30 tablet 6  . aspirin EC 81 MG tablet Take 81  mg by mouth daily.    . colchicine 0.6 MG tablet Take 1 tablet (0.6 mg total) by mouth daily. 30 tablet 3  . diltiazem (CARDIZEM CD) 300 MG 24 hr capsule Take 1 capsule (300 mg total) by mouth daily. 90 capsule 3  . doxycycline (VIBRA-TABS) 100 MG tablet Take 1 tablet (100 mg total) by mouth 2 (two) times daily. X 1 week 14 tablet 0  . GARLIC PO Take 1 capsule by mouth daily.    Marland Kitchen glucose blood (ONE TOUCH ULTRA TEST) test strip CHECK BLOOD SUGAR TWICE DAILY.   DX Code: E11.9 100 each 1  . KRILL OIL PO Take 1 capsule by mouth daily.    Marland Kitchen olmesartan (BENICAR) 20 MG tablet Take 1 tablet (20 mg total) by mouth daily. 90 tablet 0  . ONETOUCH DELICA LANCETS FINE MISC Check blood sugar daily 100 each 12  . predniSONE (DELTASONE) 10 MG tablet Prednisone dosing: Take  Prednisone 40mg  (4 tabs) x 2 days, then taper to 30mg  (3 tabs) x 2 days, then 20mg  (2 tabs) x 2days, then 10mg  (1 tab) x 2days, then OFF. 20 tablet 0  . tiotropium (SPIRIVA) 18 MCG inhalation capsule Place 18 mcg into inhaler and inhale daily.     No facility-administered medications prior to visit.     No Known Allergies  Review of Systems  Constitutional: Negative for chills, fever and malaise/fatigue.  HENT: Negative for congestion and hearing loss.   Eyes: Negative for discharge.  Respiratory: Negative for cough, sputum production and shortness of breath.   Cardiovascular: Negative for chest pain, palpitations and leg swelling.  Gastrointestinal: Negative for abdominal pain, blood in stool, constipation, diarrhea, heartburn, nausea and vomiting.  Genitourinary: Negative for dysuria, frequency, hematuria and urgency.  Musculoskeletal: Negative for back pain, falls and myalgias.  Skin: Negative for rash.  Neurological: Negative for dizziness, sensory change, loss of consciousness, weakness and headaches.  Endo/Heme/Allergies: Negative for environmental allergies. Does not bruise/bleed easily.  Psychiatric/Behavioral: Negative for  depression and suicidal ideas. The patient is not nervous/anxious and does not have insomnia.        Objective:    Physical Exam  Constitutional: He is oriented to person, place, and time. Vital signs are normal. He appears well-developed and well-nourished. He is sleeping.  HENT:  Head: Normocephalic and atraumatic.  Mouth/Throat: Oropharynx is clear and moist.  Eyes: EOM are normal. Pupils are equal, round, and  reactive to light.  Neck: Normal range of motion. Neck supple. No thyromegaly present.  Cardiovascular: Normal rate and regular rhythm.  No murmur heard. Pulmonary/Chest: Effort normal and breath sounds normal. No respiratory distress. He has no wheezes. He has no rales. He exhibits no tenderness.  Musculoskeletal: He exhibits no edema or tenderness.  Neurological: He is alert and oriented to person, place, and time.  Skin: Skin is warm and dry.  Psychiatric: He has a normal mood and affect. His behavior is normal. Judgment and thought content normal.  Nursing note and vitals reviewed.   BP (!) 142/72   Temp 98.1 F (36.7 C) (Oral)   Ht 6\' 1"  (1.854 m)   Wt (!) 309 lb (140.2 kg)   BMI 40.77 kg/m  Wt Readings from Last 3 Encounters:  03/01/17 (!) 309 lb (140.2 kg)  02/15/17 (!) 305 lb (138.3 kg)  02/12/17 (!) 305 lb 12.8 oz (138.7 kg)   BP Readings from Last 3 Encounters:  03/01/17 (!) 142/72  02/19/17 (!) 148/87  02/15/17 130/84     Immunization History  Administered Date(s) Administered  . Influenza Split 11/11/2012, 12/11/2012  . Influenza, High Dose Seasonal PF 12/03/2014  . Influenza,inj,Quad PF,6+ Mos 11/21/2013  . Influenza-Unspecified 01/05/2016  . Pneumococcal Conjugate-13 11/12/2011  . Pneumococcal Polysaccharide-23 09/30/2012  . Tdap 03/25/2013  . Zoster 10/01/2012    Health Maintenance  Topic Date Due  . FOOT EXAM  05/05/2015  . OPHTHALMOLOGY EXAM  08/10/2016  . INFLUENZA VACCINE  11/12/2017 (Originally 10/11/2016)  . HEMOGLOBIN A1C   03/03/2017  . TETANUS/TDAP  03/26/2023  . COLONOSCOPY  11/24/2024  . Hepatitis C Screening  Completed  . PNA vac Low Risk Adult  Completed    Lab Results  Component Value Date   WBC 13.1 (H) 02/19/2017   HGB 13.2 02/19/2017   HCT 39.4 02/19/2017   PLT 365 02/19/2017   GLUCOSE 109 (H) 02/19/2017   CHOL 123 09/02/2016   TRIG 67 09/02/2016   HDL 45 09/02/2016   LDLCALC 65 09/02/2016   ALT 17 02/16/2017   AST 23 02/16/2017   NA 140 02/19/2017   K 3.8 02/19/2017   CL 101 02/19/2017   CREATININE 1.47 (H) 02/19/2017   BUN 25 (H) 02/19/2017   CO2 32 02/19/2017   TSH 0.736 09/01/2016   PSA 4.96 (H) 02/26/2015   INR 1.23 09/01/2016   HGBA1C 5.2 09/01/2016   MICROALBUR 69.1 02/26/2015    Lab Results  Component Value Date   TSH 0.736 09/01/2016   Lab Results  Component Value Date   WBC 13.1 (H) 02/19/2017   HGB 13.2 02/19/2017   HCT 39.4 02/19/2017   MCV 84.4 02/19/2017   PLT 365 02/19/2017   Lab Results  Component Value Date   NA 140 02/19/2017   K 3.8 02/19/2017   CO2 32 02/19/2017   GLUCOSE 109 (H) 02/19/2017   BUN 25 (H) 02/19/2017   CREATININE 1.47 (H) 02/19/2017   BILITOT 1.1 02/16/2017   ALKPHOS 74 02/16/2017   AST 23 02/16/2017   ALT 17 02/16/2017   PROT 7.3 02/16/2017   ALBUMIN 3.2 (L) 02/16/2017   CALCIUM 8.9 02/19/2017   ANIONGAP 7 02/19/2017   GFR 45.41 (L) 02/09/2017   Lab Results  Component Value Date   CHOL 123 09/02/2016   Lab Results  Component Value Date   HDL 45 09/02/2016   Lab Results  Component Value Date   LDLCALC 65 09/02/2016   Lab Results  Component Value  Date   TRIG 67 09/02/2016   Lab Results  Component Value Date   CHOLHDL 2.7 09/02/2016   Lab Results  Component Value Date   HGBA1C 5.2 09/01/2016         Assessment & Plan:   Problem List Items Addressed This Visit      Unprioritized   Cellulitis - Primary   Relevant Orders   Lipid panel   CBC with Differential/Platelet   Comprehensive metabolic panel     Hemoglobin A1c   Gout   Relevant Orders   Lipid panel   CBC with Differential/Platelet   Comprehensive metabolic panel   Hemoglobin A1c   Uric acid   Hyperlipidemia    Tolerating statin, encouraged heart healthy diet, avoid trans fats, minimize simple carbs and saturated fats. Increase exercise as tolerated      Hypertension    Well controlled, no changes to meds. Encouraged heart healthy diet such as the DASH diet and exercise as tolerated.        Relevant Orders   Lipid panel   CBC with Differential/Platelet   Comprehensive metabolic panel    Other Visit Diagnoses    Controlled type 2 diabetes mellitus with other specified complication, without long-term current use of insulin ([facility])       Relevant Orders   Hemoglobin A1c   Hyperlipidemia LDL goal <100       Relevant Orders   Lipid panel   Comprehensive metabolic panel      I am having Susette Racer maintain his albuterol, ONETOUCH DELICA LANCETS FINE, diltiazem, tiotropium, glucose blood, olmesartan, allopurinol, aspirin EC, GARLIC PO, KRILL OIL PO, colchicine, doxycycline, and predniSONE.  No orders of the defined types were placed in this encounter.   CMA served as Education administrator during this visit. History, Physical and Plan performed by medical provider. Documentation and orders reviewed and attested to.  Ann Held, DO

## 2017-03-01 NOTE — Patient Instructions (Signed)

## 2017-03-02 ENCOUNTER — Encounter: Payer: Self-pay | Admitting: Family Medicine

## 2017-03-02 LAB — LDL CHOLESTEROL, DIRECT: Direct LDL: 59 mg/dL

## 2017-03-02 LAB — LIPID PANEL
CHOL/HDL RATIO: 2
Cholesterol: 150 mg/dL (ref 0–200)
HDL: 62.5 mg/dL (ref >39.00)
NonHDL: 87.14
Triglycerides: 213 mg/dL — ABNORMAL HIGH (ref 0.0–149.0)
VLDL: 42.6 mg/dL — AB (ref 0.0–40.0)

## 2017-03-02 LAB — COMPREHENSIVE METABOLIC PANEL
ALBUMIN: 3.7 g/dL (ref 3.5–5.2)
ALK PHOS: 73 U/L (ref 39–117)
ALT: 20 U/L (ref 0–53)
AST: 16 U/L (ref 0–37)
BILIRUBIN TOTAL: 0.5 mg/dL (ref 0.2–1.2)
BUN: 22 mg/dL (ref 6–23)
CALCIUM: 9.7 mg/dL (ref 8.4–10.5)
CO2: 38 mEq/L — ABNORMAL HIGH (ref 19–32)
CREATININE: 1.54 mg/dL — AB (ref 0.40–1.50)
Chloride: 95 mEq/L — ABNORMAL LOW (ref 96–112)
GFR: 47.45 mL/min — ABNORMAL LOW (ref >60.00)
Glucose, Bld: 86 mg/dL (ref 70–99)
Potassium: 4.1 mEq/L (ref 3.5–5.1)
Sodium: 138 mEq/L (ref 135–145)
TOTAL PROTEIN: 6.5 g/dL (ref 6.0–8.3)

## 2017-03-02 LAB — HEMOGLOBIN A1C: Hgb A1c MFr Bld: 5.6 % (ref 4.6–6.5)

## 2017-03-02 LAB — CBC WITH DIFFERENTIAL/PLATELET
BASOS ABS: 0.1 10*3/uL (ref 0.0–0.1)
Basophils Relative: 1 % (ref 0.0–3.0)
EOS ABS: 0.3 10*3/uL (ref 0.0–0.7)
Eosinophils Relative: 2.1 % (ref 0.0–5.0)
HEMATOCRIT: 46.1 % (ref 39.0–52.0)
HEMOGLOBIN: 15 g/dL (ref 13.0–17.0)
LYMPHS PCT: 17.5 % (ref 12.0–46.0)
Lymphs Abs: 2.2 10*3/uL (ref 0.7–4.0)
MCHC: 32.4 g/dL (ref 30.0–36.0)
MCV: 87.8 fl (ref 78.0–100.0)
MONOS PCT: 7.7 % (ref 3.0–12.0)
Monocytes Absolute: 1 10*3/uL (ref 0.1–1.0)
Neutro Abs: 8.9 10*3/uL — ABNORMAL HIGH (ref 1.4–7.7)
Neutrophils Relative %: 71.7 % (ref 43.0–77.0)
Platelets: 339 10*3/uL (ref 150.0–400.0)
RBC: 5.25 Mil/uL (ref 4.22–5.81)
RDW: 14.7 % (ref 11.5–15.5)
WBC: 12.4 10*3/uL — AB (ref 4.0–10.5)

## 2017-03-02 LAB — URIC ACID: URIC ACID, SERUM: 8.1 mg/dL — AB (ref 4.0–7.8)

## 2017-03-02 NOTE — Assessment & Plan Note (Signed)
Tolerating statin, encouraged heart healthy diet, avoid trans fats, minimize simple carbs and saturated fats. Increase exercise as tolerated 

## 2017-03-02 NOTE — Assessment & Plan Note (Signed)
Well controlled, no changes to meds. Encouraged heart healthy diet such as the DASH diet and exercise as tolerated.  °

## 2017-03-08 ENCOUNTER — Other Ambulatory Visit: Payer: Self-pay | Admitting: *Deleted

## 2017-03-08 DIAGNOSIS — D72829 Elevated white blood cell count, unspecified: Secondary | ICD-10-CM

## 2017-03-08 DIAGNOSIS — E785 Hyperlipidemia, unspecified: Secondary | ICD-10-CM

## 2017-03-08 DIAGNOSIS — I10 Essential (primary) hypertension: Secondary | ICD-10-CM

## 2017-03-08 DIAGNOSIS — L03116 Cellulitis of left lower limb: Secondary | ICD-10-CM

## 2017-03-08 DIAGNOSIS — M109 Gout, unspecified: Secondary | ICD-10-CM

## 2017-03-08 MED ORDER — ALLOPURINOL 100 MG PO TABS: 200.0000 mg | ORAL_TABLET | Freq: Every day | ORAL | 5 refills | Status: DC

## 2017-03-21 ENCOUNTER — Other Ambulatory Visit (INDEPENDENT_AMBULATORY_CARE_PROVIDER_SITE_OTHER): Payer: Medicare Other

## 2017-03-21 DIAGNOSIS — E785 Hyperlipidemia, unspecified: Secondary | ICD-10-CM | POA: Diagnosis not present

## 2017-03-21 DIAGNOSIS — M109 Gout, unspecified: Secondary | ICD-10-CM

## 2017-03-21 DIAGNOSIS — I10 Essential (primary) hypertension: Secondary | ICD-10-CM

## 2017-03-21 DIAGNOSIS — L03116 Cellulitis of left lower limb: Secondary | ICD-10-CM | POA: Diagnosis not present

## 2017-03-21 LAB — LIPID PANEL
CHOLESTEROL: 153 mg/dL (ref 0–200)
HDL: 49.8 mg/dL (ref >39.00)
LDL Cholesterol: 79 mg/dL (ref 0–99)
NonHDL: 103.41
Total CHOL/HDL Ratio: 3
Triglycerides: 123 mg/dL (ref 0.0–149.0)
VLDL: 24.6 mg/dL (ref 0.0–40.0)

## 2017-03-21 LAB — CBC WITH DIFFERENTIAL/PLATELET
BASOS PCT: 0.9 % (ref 0.0–3.0)
Basophils Absolute: 0.1 10*3/uL (ref 0.0–0.1)
EOS ABS: 0.2 10*3/uL (ref 0.0–0.7)
EOS PCT: 4.2 % (ref 0.0–5.0)
HEMATOCRIT: 45.1 % (ref 39.0–52.0)
HEMOGLOBIN: 14.8 g/dL (ref 13.0–17.0)
LYMPHS PCT: 31.1 % (ref 12.0–46.0)
Lymphs Abs: 1.8 10*3/uL (ref 0.7–4.0)
MCHC: 32.8 g/dL (ref 30.0–36.0)
MCV: 87.9 fl (ref 78.0–100.0)
Monocytes Absolute: 0.6 10*3/uL (ref 0.1–1.0)
Monocytes Relative: 10.7 % (ref 3.0–12.0)
NEUTROS ABS: 3.1 10*3/uL (ref 1.4–7.7)
Neutrophils Relative %: 53.1 % (ref 43.0–77.0)
PLATELETS: 195 10*3/uL (ref 150.0–400.0)
RBC: 5.13 Mil/uL (ref 4.22–5.81)
RDW: 14.5 % (ref 11.5–15.5)
WBC: 5.9 10*3/uL (ref 4.0–10.5)

## 2017-03-21 LAB — COMPREHENSIVE METABOLIC PANEL
ALBUMIN: 3.9 g/dL (ref 3.5–5.2)
ALT: 11 U/L (ref 0–53)
AST: 13 U/L (ref 0–37)
Alkaline Phosphatase: 74 U/L (ref 39–117)
BUN: 17 mg/dL (ref 6–23)
CALCIUM: 9.5 mg/dL (ref 8.4–10.5)
CHLORIDE: 99 meq/L (ref 96–112)
CO2: 36 meq/L — AB (ref 19–32)
Creatinine, Ser: 1.53 mg/dL — ABNORMAL HIGH (ref 0.40–1.50)
GFR: 47.8 mL/min — AB (ref >60.00)
Glucose, Bld: 99 mg/dL (ref 70–99)
POTASSIUM: 4.4 meq/L (ref 3.5–5.1)
Sodium: 143 mEq/L (ref 135–145)
Total Bilirubin: 0.6 mg/dL (ref 0.2–1.2)
Total Protein: 6.7 g/dL (ref 6.0–8.3)

## 2017-03-21 LAB — URIC ACID: URIC ACID, SERUM: 7.8 mg/dL (ref 4.0–7.8)

## 2017-03-23 ENCOUNTER — Other Ambulatory Visit: Payer: Self-pay

## 2017-03-23 MED ORDER — ALLOPURINOL 300 MG PO TABS: 300.0000 mg | ORAL_TABLET | Freq: Every day | ORAL | 6 refills | Status: DC

## 2017-03-26 ENCOUNTER — Encounter: Payer: Self-pay | Admitting: Vascular Surgery

## 2017-03-26 ENCOUNTER — Ambulatory Visit (INDEPENDENT_AMBULATORY_CARE_PROVIDER_SITE_OTHER): Payer: Medicare Other | Admitting: Vascular Surgery

## 2017-03-26 ENCOUNTER — Telehealth: Payer: Self-pay | Admitting: *Deleted

## 2017-03-26 VITALS — BP 128/67 | HR 83 | Temp 98.0°F | Resp 18 | Ht 73.0 in | Wt 305.0 lb

## 2017-03-26 DIAGNOSIS — I83892 Varicose veins of left lower extremities with other complications: Secondary | ICD-10-CM

## 2017-03-26 NOTE — Progress Notes (Signed)
    Stab Phlebectomy Procedure  Travis Daniels DOB:[birth year]  03/26/2017  Consent signed: Yes  Surgeon:J.D. Kellie Simmering, MD  Procedure: stab phlebectomy: left leg  BP 128/67 (BP Location: Left Arm, Patient Position: Sitting, Cuff Size: Large)   Pulse 83   Temp 98 F (36.7 C) (Oral)   Resp 18   Ht 6\' 1"  (1.854 m)   Wt (!) 305 lb (138.3 kg)   BMI 40.24 kg/m   Start time: 11:00AM   End time: 11:30AM   Tumescent Anesthesia: 150 cc 0.9% NaCl with 50 cc Lidocaine HCL !% and 15 cc 8.4% NaHCO3  Local Anesthesia: 3 cc Lidocaine HCL and NaHCO3 (ratio 2:1)    Stab Phlebectomy: 10-20 Sites: Thigh, Knee  Patient tolerated procedure well: Yes    Description of Procedure:  After marking the course of the secondary varicosities, the patient was placed on the operating table in the supine position, and the left leg was prepped and draped in sterile fashion.    The patient was then put into Trendelenburg position.  Local anesthetic was administered at the previously marked varicosities, and tumescent anesthesia was administered around the vessels.  Ten to 20 stab wounds were made using the tip of an 11 blade. And using the vein hook, the phlebectomies were performed using a hemostat to avulse the varicosities.  Adequate hemostasis was achieved, and steri strips were applied to the stab wound.     ABD pads and thigh high compression stockings were applied as well ace wraps where needed. Blood loss was less than 15 cc.  The patient ambulated out of the operating room having tolerated the procedure well.

## 2017-03-26 NOTE — Telephone Encounter (Signed)
Received Physician Orders from [facility] for progress notes as documantation for Medicare Compliance on Diabetes testing supplies; forwarded to provider with attached OV Note that must be signed and dated/SLS 01/14

## 2017-03-26 NOTE — Progress Notes (Signed)
Subjective:     [name], male   DOB: 1945-04-13, 72 y.o.   MRN: [id number]  HPI This 72 year old male had multiple stab phlebectomy of the left leg performed under local tumescent anesthesia-10-20-for painful varicosities. He had previously undergone laser ablation left great saphenous vein. He is to see Dr. Joni Fears the near future to scheduled left total knee replacement.  Review of Systems     Objective:   Physical Exam BP 128/67 (BP Location: Left Arm, Patient Position: Sitting, Cuff Size: Large)   Pulse 83   Temp 98 F (36.7 C) (Oral)   Resp 18   Ht 6\' 1"  (1.854 m)   Wt (!) 305 lb (138.3 kg)   BMI 40.24 kg/m        Assessment:     Multiple stab phlebectomy painful varicosities left leg-10-20-performed under local tumescent anesthesia    Plan:     Return in 3 months for final follow-up Arrange for appointment with Dr. Durward Fortes after a few weeks to schedule total knee replacement

## 2017-03-28 ENCOUNTER — Encounter: Payer: Self-pay | Admitting: Vascular Surgery

## 2017-05-14 DIAGNOSIS — C61 Malignant neoplasm of prostate: Secondary | ICD-10-CM | POA: Diagnosis not present

## 2017-05-22 DIAGNOSIS — C61 Malignant neoplasm of prostate: Secondary | ICD-10-CM | POA: Diagnosis not present

## 2017-05-22 DIAGNOSIS — R3129 Other microscopic hematuria: Secondary | ICD-10-CM | POA: Diagnosis not present

## 2017-06-06 DIAGNOSIS — N179 Acute kidney failure, unspecified: Secondary | ICD-10-CM | POA: Diagnosis not present

## 2017-06-06 DIAGNOSIS — N183 Chronic kidney disease, stage 3 (moderate): Secondary | ICD-10-CM | POA: Diagnosis not present

## 2017-06-06 DIAGNOSIS — I129 Hypertensive chronic kidney disease with stage 1 through stage 4 chronic kidney disease, or unspecified chronic kidney disease: Secondary | ICD-10-CM | POA: Diagnosis not present

## 2017-06-06 DIAGNOSIS — E785 Hyperlipidemia, unspecified: Secondary | ICD-10-CM | POA: Diagnosis not present

## 2017-06-06 DIAGNOSIS — R3129 Other microscopic hematuria: Secondary | ICD-10-CM | POA: Diagnosis not present

## 2017-06-06 DIAGNOSIS — R809 Proteinuria, unspecified: Secondary | ICD-10-CM | POA: Diagnosis not present

## 2017-06-06 DIAGNOSIS — E1122 Type 2 diabetes mellitus with diabetic chronic kidney disease: Secondary | ICD-10-CM | POA: Diagnosis not present

## 2017-06-27 ENCOUNTER — Encounter: Payer: Self-pay | Admitting: Medical

## 2017-06-27 ENCOUNTER — Ambulatory Visit (INDEPENDENT_AMBULATORY_CARE_PROVIDER_SITE_OTHER): Payer: Medicare Other | Admitting: Medical

## 2017-06-27 ENCOUNTER — Ambulatory Visit (HOSPITAL_BASED_OUTPATIENT_CLINIC_OR_DEPARTMENT_OTHER)
Admission: RE | Admit: 2017-06-27 | Discharge: 2017-06-27 | Disposition: A | Discharge status: Home / Self Care | Payer: Medicare Other | Source: Ambulatory Visit | Attending: Medical | Admitting: Medical

## 2017-06-27 VITALS — BP 135/82 | HR 54 | Temp 97.8°F | Resp 14 | Ht 72.0 in | Wt 321.2 lb

## 2017-06-27 DIAGNOSIS — M25562 Pain in left knee: Secondary | ICD-10-CM | POA: Diagnosis not present

## 2017-06-27 DIAGNOSIS — M1712 Unilateral primary osteoarthritis, left knee: Secondary | ICD-10-CM | POA: Diagnosis not present

## 2017-06-27 DIAGNOSIS — R944 Abnormal results of kidney function studies: Secondary | ICD-10-CM

## 2017-06-27 LAB — CBC WITH DIFFERENTIAL/PLATELET
BASOS ABS: 0.1 10*3/uL (ref 0.0–0.1)
Basophils Relative: 1.2 % (ref 0.0–3.0)
EOS PCT: 4 % (ref 0.0–5.0)
Eosinophils Absolute: 0.2 10*3/uL (ref 0.0–0.7)
HCT: 44.8 % (ref 39.0–52.0)
Hemoglobin: 15 g/dL (ref 13.0–17.0)
LYMPHS ABS: 1.4 10*3/uL (ref 0.7–4.0)
Lymphocytes Relative: 22.8 % (ref 12.0–46.0)
MCHC: 33.5 g/dL (ref 30.0–36.0)
MCV: 87.6 fl (ref 78.0–100.0)
MONO ABS: 0.7 10*3/uL (ref 0.1–1.0)
Monocytes Relative: 11.9 % (ref 3.0–12.0)
NEUTROS ABS: 3.7 10*3/uL (ref 1.4–7.7)
NEUTROS PCT: 60.1 % (ref 43.0–77.0)
PLATELETS: 203 10*3/uL (ref 150.0–400.0)
RBC: 5.12 Mil/uL (ref 4.22–5.81)
RDW: 15 % (ref 11.5–15.5)
WBC: 6.2 10*3/uL (ref 4.0–10.5)

## 2017-06-27 LAB — COMPREHENSIVE METABOLIC PANEL
ALT: 18 U/L (ref 0–53)
AST: 17 U/L (ref 0–37)
Albumin: 3.9 g/dL (ref 3.5–5.2)
Alkaline Phosphatase: 70 U/L (ref 39–117)
BILIRUBIN TOTAL: 0.6 mg/dL (ref 0.2–1.2)
BUN: 20 mg/dL (ref 6–23)
CALCIUM: 9.8 mg/dL (ref 8.4–10.5)
CHLORIDE: 100 meq/L (ref 96–112)
CO2: 34 meq/L — AB (ref 19–32)
Creatinine, Ser: 1.46 mg/dL (ref 0.40–1.50)
GFR: 50.42 mL/min — AB (ref >60.00)
GLUCOSE: 95 mg/dL (ref 70–99)
POTASSIUM: 3.8 meq/L (ref 3.5–5.1)
Sodium: 142 mEq/L (ref 135–145)
Total Protein: 6.6 g/dL (ref 6.0–8.3)

## 2017-06-27 LAB — URIC ACID: URIC ACID, SERUM: 7.2 mg/dL (ref 4.0–7.8)

## 2017-06-27 MED ORDER — HYDROCODONE-ACETAMINOPHEN 5-325 MG PO TABS: 1.0000 | ORAL_TABLET | Freq: Four times a day (QID) | ORAL | 0 refills | Status: DC | PRN

## 2017-06-27 NOTE — Patient Instructions (Signed)
You do have some severe knee pain recently with severe crepitus on physical exam.  This does match previous x-ray done in 2017.  Want to repeat x-ray today to see if x-ray findings have worsened.  Since you did not respond to NSAIDs at home and you do have mild low GFR, I did prescribe you Norco tablets to use.  Might also prescribed brief taper dose of prednisone after review of labs.(We will get CBC, CMP and uric acid.).  I did go ahead and put in referral for orthopedist.  Depending on your response to Norco and results of x-rays, we might try to expedite your appointment and try to get you in the next week.  Follow-up in 7-10 days or as needed.

## 2017-06-27 NOTE — Progress Notes (Signed)
Subjective:    [name], male    DOB: [birth year], 72 y.o.   MRN: [id number]  HPI  Pt in with some severe left knee pain. Pt states on and off knee pain in past. Last 2 weeks is getting progressively worse pain of his chronic knee pain. Pain worse with walking and going up stairs worsens pain.  Xray of left knee showed below results.  Films of the left knee were obtained in 3 projections standing. There is  complete collapse of the medial compartment with approximately 3-4 of  varus. There are peripheral osteophytes and subchondral sclerosis of the  medial femur and tibial plateau. There are degenerative changes at the  patellofemoral joint and lateral compartment as well  Pt states tylenol and advil won't decrease pain at all.  Pt describes pain in tibial plateau region.  Pt last a1-c was 5.6.  Pt does have history of gout. Review of Systems  Constitutional: Negative for chills, fatigue and fever.  Respiratory: Negative for cough, chest tightness, shortness of breath and wheezing.   Cardiovascular: Negative for chest pain and palpitations.  Gastrointestinal: Negative for abdominal pain and constipation.  Musculoskeletal: Negative for back pain.       Left knee pain.  Skin: Negative for pallor and rash.  Hematological: Negative for adenopathy.  Psychiatric/Behavioral: Negative for behavioral problems and confusion.    Past Medical History:  Diagnosis Date  . Arthritis   . B12 deficiency   . COPD (chronic obstructive pulmonary disease) ([place]) 01/2011   FVC 47%, FEV1 49%   . 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 Family history of breast cancer   . Family history of colon cancer   . Family history of pancreatic cancer   . Family history of prostate cancer   . Family history of renal cancer   . Hyperlipidemia   . Hypertension   . Lower extremity edema   . Prostate cancer ([place])   . Secondary pulmonary hypertension 01/2011   RV syst  pressure 30-40 mm Hg on ECHO      Social History   Socioeconomic History  . Marital status: Married    Spouse name: Not on file  . Number of children: 4  . Years of education: Not on file  . Highest education level: Not on file  Occupational History  . Occupation: retired  Scientific laboratory technician  . Financial resource strain: Not on file  . Food insecurity:    Worry: Not on file    Inability: Not on file  . Transportation needs:    Medical: Not on file    Non-medical: Not on file  Tobacco Use  . Smoking status: Never Smoker  . Smokeless tobacco: Never Used  . Tobacco comment: occ alcohol  Substance and Sexual Activity  . Alcohol use: Yes    Alcohol/week: 0.0 oz    Comment: 2-3 drinks occasionally  . Drug use: No  . Sexual activity: Yes  Lifestyle  . Physical activity:    Days per week: Not on file    Minutes per session: Not on file  . Stress: Not on file  Relationships  . Social connections:    Talks on phone: Not on file    Gets together: Not on file    Attends religious service: Not on file    Active member of club or organization: Not on file    Attends meetings of clubs or organizations: Not on file  Relationship status: Not on file  . Intimate partner violence:    Fear of current or ex partner: Not on file    Emotionally abused: Not on file    Physically abused: Not on file    Forced sexual activity: Not on file  Other Topics Concern  . Not on file  Social History Narrative   Mr. Kleinert lives with his wife in [place]. He is recently retired and relocated to [facility] from [place]. Historically, his medical provider has been [facility] in [place].    Past Surgical History:  Procedure Laterality Date  . 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 Ronelle Nigh at [facility] in [place], [place])  . ENDOVENOUS ABLATION SAPHENOUS VEIN W/ LASER Left 09/18/2016   endovenous laser ablation left  greater saphenous vein by Tinnie Gens MD   . ENDOVENOUS ABLATION SAPHENOUS VEIN W/ LASER Left 10/09/2016   endovenous laser ablation left small saphenous vein by Tinnie Gens MD   . FINGER SURGERY Right 11   rt middle finger  . PROSTATE BIOPSY    . TOTAL SHOULDER ARTHROPLASTY Right 01/21/2013   Procedure: SHOULDER HEMI ARTHROPLASTY;  Surgeon: Garald Balding, MD;  Location: [facility];  Service: Orthopedics;  Laterality: Right;  . TRANSTHORACIC ECHOCARDIOGRAM  01/2011   EF 55-60%, mild asymmetric LVH, elevated RV systolic pressure  . VASECTOMY      Family History  Problem Relation Age of Onset  . CAD Mother        MI in her 94s  . Skin cancer Mother        multiple skin cancers removed from face and head  . Cancer Father        colon and pancreas  . Epilepsy Son   . Cancer Maternal Grandmother 41       "stomach"  . Cancer Maternal Grandfather        prostate; died at the age of 70  . Cancer Cousin        paternal first cousin kidney dx under 45  . Breast cancer Cousin        maternal first cousin with breast cancer over 25    No Known Allergies  Current Outpatient Medications on File Prior to Visit  Medication Sig Dispense Refill  . albuterol (PROVENTIL HFA;VENTOLIN HFA) 108 (90 BASE) MCG/ACT inhaler Inhale 2 puffs into the lungs every 6 (six) hours as needed for wheezing or shortness of breath.    . allopurinol (ZYLOPRIM) 300 MG tablet Take 1 tablet (300 mg total) by mouth daily. 30 tablet 6  . aspirin EC 81 MG tablet Take 81 mg by mouth daily.    . colchicine 0.6 MG tablet Take 1 tablet (0.6 mg total) by mouth daily. 30 tablet 3  . diltiazem (CARDIZEM CD) 300 MG 24 hr capsule Take 1 capsule (300 mg total) by mouth daily. 90 capsule 3  . doxycycline (VIBRA-TABS) 100 MG tablet Take 1 tablet (100 mg total) by mouth 2 (two) times daily. X 1 week 14 tablet 0  . GARLIC PO Take 1 capsule by mouth daily.    Marland Kitchen glucose blood (ONE TOUCH ULTRA TEST) test strip CHECK BLOOD SUGAR TWICE  DAILY.   DX Code: E11.9 100 each 1  . KRILL OIL PO Take 1 capsule by mouth daily.    Marland Kitchen olmesartan (BENICAR) 20 MG tablet Take 1 tablet (20 mg total) by mouth daily. 90 tablet 0  .  ONETOUCH DELICA LANCETS FINE MISC Check blood sugar daily 100 each 12  . predniSONE (DELTASONE) 10 MG tablet Prednisone dosing: Take  Prednisone 40mg  (4 tabs) x 2 days, then taper to 30mg  (3 tabs) x 2 days, then 20mg  (2 tabs) x 2days, then 10mg  (1 tab) x 2days, then OFF. 20 tablet 0  . tiotropium (SPIRIVA) 18 MCG inhalation capsule Place 18 mcg into inhaler and inhale daily.     No current facility-administered medications on file prior to visit.     BP 135/82   Pulse (!) 54   Temp 97.8 F (36.6 C) (Oral)   Resp 14   Ht 6' (1.829 m)   Wt (!) 321 lb 3.2 oz (145.7 kg)   SpO2 99%   BMI 43.56 kg/m       Objective:   Physical Exam  General- No acute distress. Pleasant patient. Lungs- Clear, even and unlabored. Heart- regular rate and rhythm. Neurologic- CNII- XII grossly intact.  Left knee- faint-mild swollen on inspection. No warmth. Mild tibial plateua tenderness. Severe crepitus on flexion and extension. Left lower ext- calfs symmetric. Negative homans.      Assessment & Plan:  You do have some severe knee pain recently with severe crepitus on physical exam.  This does match previous x-ray done in 2017.  Want to repeat x-ray today to see if x-ray findings have worsened.  Since you did not respond to NSAIDs at home and you do have mild low GFR, I did prescribe you Norco tablets to use.  Might also prescribed brief taper dose of prednisone after review of labs.(We will get CBC, CMP and uric acid.).  I did go ahead and put in referral for orthopedist.  Depending on your response to Norco and results of x-rays, we might try to expedite your appointment and try to get you in the next week.  Follow-up in 7-10 days or as needed.  Mackie Pai, PA-C

## 2017-06-28 ENCOUNTER — Other Ambulatory Visit: Payer: Self-pay | Admitting: *Deleted

## 2017-06-28 DIAGNOSIS — E1151 Type 2 diabetes mellitus with diabetic peripheral angiopathy without gangrene: Secondary | ICD-10-CM

## 2017-06-28 MED ORDER — GLUCOSE BLOOD VI STRP: ORAL_STRIP | 1 refills | Status: DC

## 2017-07-02 ENCOUNTER — Ambulatory Visit: Payer: Medicare Other | Admitting: Vascular Surgery

## 2017-07-30 ENCOUNTER — Ambulatory Visit (INDEPENDENT_AMBULATORY_CARE_PROVIDER_SITE_OTHER): Payer: Medicare Other | Admitting: Vascular Surgery

## 2017-07-30 ENCOUNTER — Encounter: Payer: Self-pay | Admitting: Vascular Surgery

## 2017-07-30 VITALS — BP 135/77 | HR 89 | Temp 97.8°F | Resp 18 | Ht 73.0 in | Wt 310.4 lb

## 2017-07-30 DIAGNOSIS — I83892 Varicose veins of left lower extremities with other complications: Secondary | ICD-10-CM | POA: Diagnosis not present

## 2017-07-30 NOTE — Progress Notes (Signed)
Subjective:     [name], male   DOB: 1946/02/23, 72 y.o.   MRN: [id number]  HPI This 72 year old male returns for 68-month follow-up regarding multiple stab phlebectomy of residual painful varicosities left leg.  He had previously undergone laser ablation of the left great saphenous vein a few months earlier.  States that the swelling in the left leg is significantly improved from prior to the procedure.  The stab phlebectomy wounds have healed nicely.  He needs a left knee replacement to be done by Dr. Alvan Dame in the near future.  He has no specific complaints.  Past Medical History:  Diagnosis Date  . Arthritis   . B12 deficiency   . COPD (chronic obstructive pulmonary disease) ([place]) 01/2011   FVC 47%, FEV1 49%   . 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 Family history of breast cancer   . Family history of colon cancer   . Family history of pancreatic cancer   . Family history of prostate cancer   . Family history of renal cancer   . Hyperlipidemia   . Hypertension   . Lower extremity edema   . Prostate cancer ([place])   . Secondary pulmonary hypertension 01/2011   RV syst pressure 30-40 mm Hg on ECHO     Social History   Tobacco Use  . Smoking status: Never Smoker  . Smokeless tobacco: Never Used  . Tobacco comment: occ alcohol  Substance Use Topics  . Alcohol use: Yes    Alcohol/week: 0.0 oz    Comment: 2-3 drinks occasionally    Family History  Problem Relation Age of Onset  . CAD Mother        MI in her 34s  . Skin cancer Mother        multiple skin cancers removed from face and head  . Cancer Father        colon and pancreas  . Epilepsy Son   . Cancer Maternal Grandmother 58       "stomach"  . Cancer Maternal Grandfather        prostate; died at the age of 59  . Cancer Cousin        paternal first cousin kidney dx under 57  . Breast cancer Cousin        maternal first cousin with breast cancer over 50    No Known  Allergies   Current Outpatient Medications:  .  albuterol (PROVENTIL HFA;VENTOLIN HFA) 108 (90 BASE) MCG/ACT inhaler, Inhale 2 puffs into the lungs every 6 (six) hours as needed for wheezing or shortness of breath., Disp: , Rfl:  .  allopurinol (ZYLOPRIM) 300 MG tablet, Take 1 tablet (300 mg total) by mouth daily., Disp: 30 tablet, Rfl: 6 .  aspirin EC 81 MG tablet, Take 81 mg by mouth daily., Disp: , Rfl:  .  diltiazem (CARDIZEM CD) 300 MG 24 hr capsule, Take 1 capsule (300 mg total) by mouth daily., Disp: 90 capsule, Rfl: 3 .  GARLIC PO, Take 1 capsule by mouth daily., Disp: , Rfl:  .  glucose blood (ONE TOUCH ULTRA TEST) test strip, CHECK BLOOD SUGAR TWICE DAILY.   DX Code: E11.9, Disp: 100 each, Rfl: 1 .  KRILL OIL PO, Take 1 capsule by mouth daily., Disp: , Rfl:  .  olmesartan (BENICAR) 20 MG tablet, Take 1 tablet (20 mg total) by mouth daily., Disp: 90 tablet, Rfl: 0 .  ONETOUCH DELICA LANCETS FINE MISC,  Check blood sugar daily, Disp: 100 each, Rfl: 12 .  predniSONE (DELTASONE) 10 MG tablet, Prednisone dosing: Take  Prednisone 40mg  (4 tabs) x 2 days, then taper to 30mg  (3 tabs) x 2 days, then 20mg  (2 tabs) x 2days, then 10mg  (1 tab) x 2days, then OFF., Disp: 20 tablet, Rfl: 0 .  tiotropium (SPIRIVA) 18 MCG inhalation capsule, Place 18 mcg into inhaler and inhale daily., Disp: , Rfl:  .  colchicine 0.6 MG tablet, Take 1 tablet (0.6 mg total) by mouth daily. (Patient not taking: Reported on 07/30/2017), Disp: 30 tablet, Rfl: 3 .  doxycycline (VIBRA-TABS) 100 MG tablet, Take 1 tablet (100 mg total) by mouth 2 (two) times daily. X 1 week (Patient not taking: Reported on 07/30/2017), Disp: 14 tablet, Rfl: 0 .  HYDROcodone-acetaminophen (NORCO) 5-325 MG tablet, Take 1 tablet by mouth every 6 (six) hours as needed for moderate pain. (Patient not taking: Reported on 07/30/2017), Disp: 20 tablet, Rfl: 0  Vitals:   07/30/17 1357  BP: 135/77  Pulse: 89  Resp: 18  Temp: 97.8 F (36.6 C)  TempSrc:  Oral  SpO2: 95%  Weight: (!) 310 lb 6.4 oz (140.8 kg)  Height: 6\' 1"  (1.854 m)    Body mass index is 40.95 kg/m.         Review of Systems Denies chest pain, dyspnea on exertion, PND, orthopnea, hemoptysis    Objective:   Physical Exam BP 135/77 (BP Location: Left Arm, Patient Position: Sitting, Cuff Size: Large)   Pulse 89   Temp 97.8 F (36.6 C) (Oral)   Resp 18   Ht 6\' 1"  (1.854 m)   Wt (!) 310 lb 6.4 oz (140.8 kg)   SpO2 95%   BMI 40.95 kg/m   Obese male no apparent distress alert and oriented x3 Lungs no rhonchi or wheezing Cardiovascular regular rhythm no murmurs Left leg with chronic hyperpigmentation of the lower half with no active ulcer.  1+ chronic edema.  3+ dorsalis pedis pulse palpable.     Assessment:     Status post laser ablation left great saphenous vein for gross reflux followed by multiple stab phlebectomy of painful varicosities particularly in the patellar area with good result Chronic edema improved following procedure    Plan:     Patient will return to see me on a as needed basis

## 2017-08-22 DIAGNOSIS — M1712 Unilateral primary osteoarthritis, left knee: Secondary | ICD-10-CM | POA: Diagnosis not present

## 2017-08-22 DIAGNOSIS — M25562 Pain in left knee: Secondary | ICD-10-CM | POA: Diagnosis not present

## 2017-08-30 ENCOUNTER — Encounter: Payer: Self-pay | Admitting: Family Medicine

## 2017-08-30 ENCOUNTER — Ambulatory Visit (INDEPENDENT_AMBULATORY_CARE_PROVIDER_SITE_OTHER): Payer: Medicare Other | Admitting: Family Medicine

## 2017-08-30 VITALS — BP 139/77 | HR 62 | Temp 98.3°F | Resp 16 | Wt 309.2 lb

## 2017-08-30 DIAGNOSIS — E785 Hyperlipidemia, unspecified: Secondary | ICD-10-CM | POA: Diagnosis not present

## 2017-08-30 DIAGNOSIS — R972 Elevated prostate specific antigen [PSA]: Secondary | ICD-10-CM

## 2017-08-30 DIAGNOSIS — I10 Essential (primary) hypertension: Secondary | ICD-10-CM

## 2017-08-30 DIAGNOSIS — E1165 Type 2 diabetes mellitus with hyperglycemia: Secondary | ICD-10-CM

## 2017-08-30 DIAGNOSIS — D229 Melanocytic nevi, unspecified: Secondary | ICD-10-CM

## 2017-08-30 LAB — POC URINALSYSI DIPSTICK (AUTOMATED)
Bilirubin, UA: NEGATIVE
GLUCOSE UA: NEGATIVE
Ketones, UA: NEGATIVE
Leukocytes, UA: NEGATIVE
NITRITE UA: NEGATIVE
PH UA: 7 (ref 5.0–8.0)
PROTEIN UA: NEGATIVE
RBC UA: NEGATIVE
Spec Grav, UA: 1.015 (ref 1.010–1.025)
UROBILINOGEN UA: 0.2 U/dL

## 2017-08-30 LAB — MICROALBUMIN / CREATININE URINE RATIO
Creatinine,U: 86.9 mg/dL
MICROALB/CREAT RATIO: 1.5 mg/g (ref 0.0–30.0)
Microalb, Ur: 1.3 mg/dL (ref 0.0–1.9)

## 2017-08-30 NOTE — Assessment & Plan Note (Signed)
Encouraged heart healthy diet, increase exercise, avoid trans fats, consider a krill oil cap daily 

## 2017-08-30 NOTE — Assessment & Plan Note (Signed)
Check psa today

## 2017-08-30 NOTE — Assessment & Plan Note (Signed)
Well controlled, no changes to meds. Encouraged heart healthy diet such as the DASH diet and exercise as tolerated.  °

## 2017-08-30 NOTE — Patient Instructions (Signed)

## 2017-08-30 NOTE — Progress Notes (Signed)
Subjective:  I acted as a Education administrator for [employer]. Travis Daniels, [place]   [name], male    DOB: [birth year], 72 y.o.   MRN: [id number]  Chief Complaint  Patient presents with  . Cellulitis    HPI  Patient is in today for follow up visit on cellulitis.  He is much better and is controlling the swelling with compression socks.  He also needs f/u chol   He needs surgery on his knees but needs to lose weight before surgery.   Patient Care Team: Carollee Herter, [name], DO as PCP - General (Family Medicine) Donato Heinz, MD as Consulting Physician (Nephrology)   Past Medical History:  Diagnosis Date  . Arthritis   . B12 deficiency   . COPD (chronic obstructive pulmonary disease) ([place]) 01/2011   FVC 47%, FEV1 49%   . 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 Family history of breast cancer   . Family history of colon cancer   . Family history of pancreatic cancer   . Family history of prostate cancer   . Family history of renal cancer   . Hyperlipidemia   . Hypertension   . Lower extremity edema   . Prostate cancer ([place])   . Secondary pulmonary hypertension 01/2011   RV syst pressure 30-40 mm Hg on ECHO     Past Surgical History:  Procedure Laterality Date  . 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 Ronelle Nigh at [facility] in [place], [place])  . ENDOVENOUS ABLATION SAPHENOUS VEIN W/ LASER Left 09/18/2016   endovenous laser ablation left greater saphenous vein by Tinnie Gens MD   . ENDOVENOUS ABLATION SAPHENOUS VEIN W/ LASER Left 10/09/2016   endovenous laser ablation left small saphenous vein by Tinnie Gens MD   . FINGER SURGERY Right 11   rt middle finger  . PROSTATE BIOPSY    . TOTAL SHOULDER ARTHROPLASTY Right 01/21/2013   Procedure: SHOULDER HEMI ARTHROPLASTY;  Surgeon: Garald Balding, MD;  Location: [facility];  Service: Orthopedics;  Laterality:  Right;  . TRANSTHORACIC ECHOCARDIOGRAM  01/2011   EF 55-60%, mild asymmetric LVH, elevated RV systolic pressure  . VASECTOMY      Family History  Problem Relation Age of Onset  . CAD Mother        MI in her 56s  . Skin cancer Mother        multiple skin cancers removed from face and head  . Cancer Father        colon and pancreas  . Epilepsy Son   . Cancer Maternal Grandmother 80       "stomach"  . Cancer Maternal Grandfather        prostate; died at the age of 42  . Cancer Cousin        paternal first cousin kidney dx under 9  . Breast cancer Cousin        maternal first cousin with breast cancer over 78    Social History   Socioeconomic History  . Marital status: Married    Spouse name: Not on file  . Number of children: 4  . Years of education: Not on file  . Highest education level: Not on file  Occupational History  . Occupation: retired  Scientific laboratory technician  . Financial resource strain: Not on file  . Food insecurity:    Worry: Not on file  Inability: Not on file  . Transportation needs:    Medical: Not on file    Non-medical: Not on file  Tobacco Use  . Smoking status: Never Smoker  . Smokeless tobacco: Never Used  . Tobacco comment: occ alcohol  Substance and Sexual Activity  . Alcohol use: Yes    Alcohol/week: 0.0 oz    Comment: 2-3 drinks occasionally  . Drug use: No  . Sexual activity: Yes  Lifestyle  . Physical activity:    Days per week: Not on file    Minutes per session: Not on file  . Stress: Not on file  Relationships  . Social connections:    Talks on phone: Not on file    Gets together: Not on file    Attends religious service: Not on file    Active member of club or organization: Not on file    Attends meetings of clubs or organizations: Not on file    Relationship status: Not on file  . Intimate partner violence:    Fear of current or ex partner: Not on file    Emotionally abused: Not on file    Physically abused: Not on file     Forced sexual activity: Not on file  Other Topics Concern  . Not on file  Social History Narrative   Travis Daniels lives with his wife in [place]. He is recently retired and relocated to [facility] from [place]. Historically, his medical provider has been [facility] in [place].    Outpatient Medications Prior to Visit  Medication Sig Dispense Refill  . albuterol (PROVENTIL HFA;VENTOLIN HFA) 108 (90 BASE) MCG/ACT inhaler Inhale 2 puffs into the lungs every 6 (six) hours as needed for wheezing or shortness of breath.    . allopurinol (ZYLOPRIM) 300 MG tablet Take 1 tablet (300 mg total) by mouth daily. 30 tablet 6  . aspirin EC 81 MG tablet Take 81 mg by mouth daily.    . colchicine 0.6 MG tablet Take 1 tablet (0.6 mg total) by mouth daily. 30 tablet 3  . diltiazem (CARDIZEM CD) 300 MG 24 hr capsule Take 1 capsule (300 mg total) by mouth daily. 90 capsule 3  . doxycycline (VIBRA-TABS) 100 MG tablet Take 1 tablet (100 mg total) by mouth 2 (two) times daily. X 1 week 14 tablet 0  . GARLIC PO Take 1 capsule by mouth daily.    Marland Kitchen glucose blood (ONE TOUCH ULTRA TEST) test strip CHECK BLOOD SUGAR TWICE DAILY.   DX Code: E11.9 100 each 1  . HYDROcodone-acetaminophen (NORCO) 5-325 MG tablet Take 1 tablet by mouth every 6 (six) hours as needed for moderate pain. 20 tablet 0  . KRILL OIL PO Take 1 capsule by mouth daily.    Marland Kitchen olmesartan (BENICAR) 20 MG tablet Take 1 tablet (20 mg total) by mouth daily. 90 tablet 0  . ONETOUCH DELICA LANCETS FINE MISC Check blood sugar daily 100 each 12  . predniSONE (DELTASONE) 10 MG tablet Prednisone dosing: Take  Prednisone 40mg  (4 tabs) x 2 days, then taper to 30mg  (3 tabs) x 2 days, then 20mg  (2 tabs) x 2days, then 10mg  (1 tab) x 2days, then OFF. 20 tablet 0  . tiotropium (SPIRIVA) 18 MCG inhalation capsule Place 18 mcg into inhaler and inhale daily.     No facility-administered medications prior to visit.     No Known Allergies  Review of Systems  Constitutional: Negative for  chills, fever and malaise/fatigue.  HENT: Negative for congestion  and hearing loss.   Eyes: Negative for discharge.  Respiratory: Negative for cough, sputum production and shortness of breath.   Cardiovascular: Negative for chest pain, palpitations and leg swelling.  Gastrointestinal: Negative for abdominal pain, blood in stool, constipation, diarrhea, heartburn, nausea and vomiting.  Genitourinary: Negative for dysuria, frequency, hematuria and urgency.  Musculoskeletal: Negative for back pain, falls and myalgias.  Skin: Negative for rash.  Neurological: Negative for dizziness, sensory change, loss of consciousness, weakness and headaches.  Endo/Heme/Allergies: Negative for environmental allergies. Does not bruise/bleed easily.  Psychiatric/Behavioral: Negative for depression and suicidal ideas. The patient is not nervous/anxious and does not have insomnia.        Objective:    Physical Exam  Constitutional: He is oriented to person, place, and time. Vital signs are normal. He appears well-developed and well-nourished. He is sleeping.  HENT:  Head: Normocephalic and atraumatic.  Mouth/Throat: Oropharynx is clear and moist.  Eyes: Pupils are equal, round, and reactive to light. EOM are normal.  Neck: Normal range of motion. Neck supple. No thyromegaly present.  Cardiovascular: Normal rate and regular rhythm.  No murmur heard. Pulmonary/Chest: Effort normal and breath sounds normal. No respiratory distress. He has no wheezes. He has no rales. He exhibits no tenderness.  Musculoskeletal: He exhibits no edema or tenderness.  Neurological: He is alert and oriented to person, place, and time.  Skin: Skin is warm and dry.  Psychiatric: He has a normal mood and affect. His behavior is normal. Judgment and thought content normal.  Nursing note and vitals reviewed.   BP 139/77 (BP Location: Left Arm, Patient Position: Sitting, Cuff Size: Large)   Pulse 62   Temp 98.3 F (36.8 C) (Oral)    Resp 16   Wt (!) 309 lb 3.2 oz (140.3 kg)   SpO2 96%   BMI 40.79 kg/m  Wt Readings from Last 3 Encounters:  08/30/17 (!) 309 lb 3.2 oz (140.3 kg)  07/30/17 (!) 310 lb 6.4 oz (140.8 kg)  06/27/17 (!) 321 lb 3.2 oz (145.7 kg)   BP Readings from Last 3 Encounters:  08/30/17 139/77  07/30/17 135/77  06/27/17 135/82     Immunization History  Administered Date(s) Administered  . Influenza Split 11/11/2012, 12/11/2012  . Influenza, High Dose Seasonal PF 12/03/2014  . Influenza,inj,Quad PF,6+ Mos 11/21/2013  . Influenza-Unspecified 01/05/2016  . Pneumococcal Conjugate-13 11/12/2011  . Pneumococcal Polysaccharide-23 09/30/2012  . Tdap 03/25/2013  . Zoster 10/01/2012    Health Maintenance  Topic Date Due  . FOOT EXAM  05/05/2015  . OPHTHALMOLOGY EXAM  08/10/2016  . HEMOGLOBIN A1C  08/30/2017  . INFLUENZA VACCINE  11/12/2017 (Originally 10/11/2017)  . TETANUS/TDAP  03/26/2023  . COLONOSCOPY  11/24/2024  . Hepatitis C Screening  Completed  . PNA vac Low Risk Adult  Completed    Lab Results  Component Value Date   WBC 6.2 06/27/2017   HGB 15.0 06/27/2017   HCT 44.8 06/27/2017   PLT 203.0 06/27/2017   GLUCOSE 95 06/27/2017   CHOL 153 03/21/2017   TRIG 123.0 03/21/2017   HDL 49.80 03/21/2017   LDLDIRECT 59.0 03/01/2017   LDLCALC 79 03/21/2017   ALT 18 06/27/2017   AST 17 06/27/2017   NA 142 06/27/2017   K 3.8 06/27/2017   CL 100 06/27/2017   CREATININE 1.46 06/27/2017   BUN 20 06/27/2017   CO2 34 (H) 06/27/2017   TSH 0.736 09/01/2016   PSA 4.96 (H) 02/26/2015   INR 1.23 09/01/2016   HGBA1C  5.6 03/01/2017   MICROALBUR 1.3 08/30/2017    Lab Results  Component Value Date   TSH 0.736 09/01/2016   Lab Results  Component Value Date   WBC 6.2 06/27/2017   HGB 15.0 06/27/2017   HCT 44.8 06/27/2017   MCV 87.6 06/27/2017   PLT 203.0 06/27/2017   Lab Results  Component Value Date   NA 142 06/27/2017   K 3.8 06/27/2017   CO2 34 (H) 06/27/2017   GLUCOSE 95  06/27/2017   BUN 20 06/27/2017   CREATININE 1.46 06/27/2017   BILITOT 0.6 06/27/2017   ALKPHOS 70 06/27/2017   AST 17 06/27/2017   ALT 18 06/27/2017   PROT 6.6 06/27/2017   ALBUMIN 3.9 06/27/2017   CALCIUM 9.8 06/27/2017   ANIONGAP 7 02/19/2017   GFR 50.42 (L) 06/27/2017   Lab Results  Component Value Date   CHOL 153 03/21/2017   Lab Results  Component Value Date   HDL 49.80 03/21/2017   Lab Results  Component Value Date   LDLCALC 79 03/21/2017   Lab Results  Component Value Date   TRIG 123.0 03/21/2017   Lab Results  Component Value Date   CHOLHDL 3 03/21/2017   Lab Results  Component Value Date   HGBA1C 5.6 03/01/2017         Assessment & Plan:   Problem List Items Addressed This Visit      Unprioritized   Diabetes mellitus ([place])    hgba1c to be checked.   minimize simple carbs. Increase exercise as tolerated. Continue current meds       Relevant Orders   Comprehensive metabolic panel   Hemoglobin A1c   Microalbumin / creatinine urine ratio (Completed)   POCT Urinalysis Dipstick (Automated) (Completed)   Amb Ref to Medical Weight Management   Elevated PSA    Check psa today      Hyperlipidemia    Encouraged heart healthy diet, increase exercise, avoid trans fats, consider a krill oil cap daily      Hypertension    Well controlled, no changes to meds. Encouraged heart healthy diet such as the DASH diet and exercise as tolerated.       Relevant Orders   Comprehensive metabolic panel   Microalbumin / creatinine urine ratio (Completed)   POCT Urinalysis Dipstick (Automated) (Completed)   Amb Ref to Medical Weight Management    Other Visit Diagnoses    Hyperlipidemia LDL goal <100    -  Primary   Relevant Orders   Lipid panel   Comprehensive metabolic panel   Amb Ref to Medical Weight Management   Morbid obesity ([place])       Relevant Orders   Amb Ref to Medical Weight Management   [place]       Relevant Orders   Ambulatory referral to  Dermatology      I am having Travis Daniels maintain his albuterol, ONETOUCH DELICA LANCETS FINE, diltiazem, tiotropium, olmesartan, aspirin EC, GARLIC PO, KRILL OIL PO, colchicine, doxycycline, predniSONE, allopurinol, HYDROcodone-acetaminophen, and glucose blood.  No orders of the defined types were placed in this encounter.   CMA served as Education administrator during this visit. History, Physical and Plan performed by medical provider. Documentation and orders reviewed and attested to.  Ann Held, DO

## 2017-08-30 NOTE — Assessment & Plan Note (Signed)
hgba1c to be checked minimize simple carbs. Increase exercise as tolerated. Continue current meds 

## 2017-08-31 LAB — LIPID PANEL
CHOLESTEROL: 131 mg/dL (ref 0–200)
HDL: 58.6 mg/dL (ref >39.00)
LDL CALC: 52 mg/dL (ref 0–99)
NonHDL: 72.75
TRIGLYCERIDES: 104 mg/dL (ref 0.0–149.0)
Total CHOL/HDL Ratio: 2
VLDL: 20.8 mg/dL (ref 0.0–40.0)

## 2017-08-31 LAB — COMPREHENSIVE METABOLIC PANEL
ALBUMIN: 4.1 g/dL (ref 3.5–5.2)
ALT: 13 U/L (ref 0–53)
AST: 11 U/L (ref 0–37)
Alkaline Phosphatase: 85 U/L (ref 39–117)
BILIRUBIN TOTAL: 0.7 mg/dL (ref 0.2–1.2)
BUN: 22 mg/dL (ref 6–23)
CALCIUM: 9.9 mg/dL (ref 8.4–10.5)
CHLORIDE: 98 meq/L (ref 96–112)
CO2: 36 meq/L — AB (ref 19–32)
CREATININE: 1.45 mg/dL (ref 0.40–1.50)
GFR: 50.79 mL/min — ABNORMAL LOW (ref >60.00)
Glucose, Bld: 76 mg/dL (ref 70–99)
Potassium: 3.9 mEq/L (ref 3.5–5.1)
SODIUM: 141 meq/L (ref 135–145)
Total Protein: 6.6 g/dL (ref 6.0–8.3)

## 2017-08-31 LAB — HEMOGLOBIN A1C: Hgb A1c MFr Bld: 5.6 % (ref 4.6–6.5)

## 2017-09-25 DIAGNOSIS — H52223 Regular astigmatism, bilateral: Secondary | ICD-10-CM | POA: Diagnosis not present

## 2017-09-25 DIAGNOSIS — H524 Presbyopia: Secondary | ICD-10-CM | POA: Diagnosis not present

## 2017-09-25 DIAGNOSIS — E119 Type 2 diabetes mellitus without complications: Secondary | ICD-10-CM | POA: Diagnosis not present

## 2017-09-25 DIAGNOSIS — H04123 Dry eye syndrome of bilateral lacrimal glands: Secondary | ICD-10-CM | POA: Diagnosis not present

## 2017-09-25 DIAGNOSIS — H35033 Hypertensive retinopathy, bilateral: Secondary | ICD-10-CM | POA: Diagnosis not present

## 2017-09-25 DIAGNOSIS — H5203 Hypermetropia, bilateral: Secondary | ICD-10-CM | POA: Diagnosis not present

## 2017-09-25 DIAGNOSIS — H2513 Age-related nuclear cataract, bilateral: Secondary | ICD-10-CM | POA: Diagnosis not present

## 2017-09-25 LAB — HM DIABETES EYE EXAM

## 2017-10-15 ENCOUNTER — Encounter: Payer: Self-pay | Admitting: Family Medicine

## 2017-10-17 ENCOUNTER — Other Ambulatory Visit: Payer: Self-pay | Admitting: Family Medicine

## 2017-11-05 DIAGNOSIS — R3129 Other microscopic hematuria: Secondary | ICD-10-CM | POA: Diagnosis not present

## 2017-11-05 DIAGNOSIS — R809 Proteinuria, unspecified: Secondary | ICD-10-CM | POA: Diagnosis not present

## 2017-11-05 DIAGNOSIS — N179 Acute kidney failure, unspecified: Secondary | ICD-10-CM | POA: Diagnosis not present

## 2017-11-05 DIAGNOSIS — I129 Hypertensive chronic kidney disease with stage 1 through stage 4 chronic kidney disease, or unspecified chronic kidney disease: Secondary | ICD-10-CM | POA: Diagnosis not present

## 2017-11-05 DIAGNOSIS — N183 Chronic kidney disease, stage 3 (moderate): Secondary | ICD-10-CM | POA: Diagnosis not present

## 2017-11-05 DIAGNOSIS — E785 Hyperlipidemia, unspecified: Secondary | ICD-10-CM | POA: Diagnosis not present

## 2017-11-05 DIAGNOSIS — E1122 Type 2 diabetes mellitus with diabetic chronic kidney disease: Secondary | ICD-10-CM | POA: Diagnosis not present

## 2017-11-22 ENCOUNTER — Ambulatory Visit (INDEPENDENT_AMBULATORY_CARE_PROVIDER_SITE_OTHER): Payer: Medicare Other | Admitting: Family Medicine

## 2017-11-22 ENCOUNTER — Encounter: Payer: Self-pay | Admitting: Family Medicine

## 2017-11-22 ENCOUNTER — Ambulatory Visit (HOSPITAL_BASED_OUTPATIENT_CLINIC_OR_DEPARTMENT_OTHER)
Admission: RE | Admit: 2017-11-22 | Discharge: 2017-11-22 | Disposition: A | Discharge status: Home / Self Care | Payer: Medicare Other | Source: Ambulatory Visit | Attending: Family Medicine | Admitting: Family Medicine

## 2017-11-22 VITALS — BP 140/60 | HR 71 | Temp 98.4°F | Wt 308.2 lb

## 2017-11-22 DIAGNOSIS — M25532 Pain in left wrist: Secondary | ICD-10-CM | POA: Diagnosis not present

## 2017-11-22 DIAGNOSIS — M19032 Primary osteoarthritis, left wrist: Secondary | ICD-10-CM | POA: Insufficient documentation

## 2017-11-22 DIAGNOSIS — Z23 Encounter for immunization: Secondary | ICD-10-CM

## 2017-11-22 NOTE — Patient Instructions (Signed)
Wrist Pain, Adult  There are many things that can cause wrist pain. Some common causes include:   An injury to the wrist area, such as a sprain, strain, or fracture.   Overuse of the joint.   A condition that causes increased pressure on a nerve in the wrist (carpal tunnel syndrome).   Wear and tear of the joints that occurs with aging (osteoarthritis).   A variety of other types of arthritis.    Sometimes, the cause of wrist pain is not known. Often, the pain goes away when you follow instructions from your health care provider for relieving pain at home, such as resting or icing the wrist. If your wrist pain continues, it is important to tell your health care provider.  Follow these instructions at home:   Rest the wrist area for at least 48 hours or as long as told by your health care provider.   If a splint or elastic bandage has been applied, use it as told by your health care provider.  ? Remove the splint or bandage only as told by your health care provider.  ? Loosen the splint or bandage if your fingers tingle, become numb, or turn cold or blue.   If directed, apply ice to the injured area.  ? If you have a removable splint or elastic bandage, remove it as told by your health care provider.  ? Put ice in a plastic bag.  ? Place a towel between your skin and the bag or between your splint or bandage and the bag.  ? Leave the ice on for 20 minutes, 2-3 times a day.   Keep your arm raised (elevated) above the level of your heart while you are sitting or lying down.   Take over-the-counter and prescription medicines only as told by your health care provider.   Keep all follow-up visits as told by your health care provider. This is important.  Contact a health care provider if:   You have a sudden sharp pain in the wrist, hand, or arm that is different or new.   The swelling or bruising on your wrist or hand gets worse.   Your skin becomes red, gets a rash, or has open sores.   Your pain does not  get better or it gets worse.  Get help right away if:   You lose feeling in your fingers or hand.   Your fingers turn white, very red, or cold and blue.   You cannot move your fingers.   You have a fever or chills.  This information is not intended to replace advice given to you by your health care provider. Make sure you discuss any questions you have with your health care provider.  Document Released: 12/07/2004 Document Revised: 09/23/2015 Document Reviewed: 09/16/2015  [employer]vier Interactive Patient Education  2018 [employer]

## 2017-11-22 NOTE — Progress Notes (Signed)
[name], male    DOB: 06-05-1945  Age: 72 y.o. MRN: [id number]    Subjective:  Subjective  HPI Travis Daniels presents for L wrist and hand pain x 1 week.  No known injury    Pain is worse on ulnar side of wrist  No numbness/ tingling   Review of Systems  Constitutional: Negative for chills and fever.  HENT: Negative for congestion and hearing loss.   Eyes: Negative for discharge.  Respiratory: Negative for cough and shortness of breath.   Cardiovascular: Negative for chest pain, palpitations and leg swelling.  Gastrointestinal: Negative for abdominal pain, blood in stool, constipation, diarrhea, nausea and vomiting.  Genitourinary: Negative for dysuria, frequency, hematuria and urgency.  Musculoskeletal: Positive for arthralgias and joint swelling. Negative for back pain and myalgias.  Skin: Negative for rash.  Allergic/Immunologic: Negative for environmental allergies.  Neurological: Negative for dizziness, weakness and headaches.  Hematological: Does not bruise/bleed easily.  Psychiatric/Behavioral: Negative for suicidal ideas. The patient is not nervous/anxious.     History Past Medical History:  Diagnosis Date  . Arthritis   . B12 deficiency   . COPD (chronic obstructive pulmonary disease) ([place]) 01/2011   FVC 47%, FEV1 49%   . 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 Family history of breast cancer   . Family history of colon cancer   . Family history of pancreatic cancer   . Family history of prostate cancer   . Family history of renal cancer   . Hyperlipidemia   . Hypertension   . Lower extremity edema   . Prostate cancer ([place])   . Secondary pulmonary hypertension 01/2011   RV syst pressure 30-40 mm Hg on ECHO     He has a past surgical history that includes Finger surgery (Right, 11); Vasectomy; Total shoulder arthroplasty (Right, 01/21/2013); Colonoscopy w/ polypectomy (12/19/07); transthoracic echocardiogram (01/2011);  Endovenous ablation saphenous vein w/ laser (Left, 09/18/2016); Endovenous ablation saphenous vein w/ laser (Left, 10/09/2016); and Prostate biopsy.   His family history includes Breast cancer in his cousin; CAD in his mother; Cancer in his cousin, father, and maternal grandfather; Cancer (age of onset: 45) in his maternal grandmother; Epilepsy in his son; Skin cancer in his mother.He reports that he has never smoked. He has never used smokeless tobacco. He reports that he drinks alcohol. He reports that he does not use drugs.  Current Outpatient Medications on File Prior to Visit  Medication Sig Dispense Refill  . albuterol (PROVENTIL HFA;VENTOLIN HFA) 108 (90 BASE) MCG/ACT inhaler Inhale 2 puffs into the lungs every 6 (six) hours as needed for wheezing or shortness of breath.    . allopurinol (ZYLOPRIM) 300 MG tablet TAKE 1 TABLET BY MOUTH EVERY DAY 30 tablet 6  . aspirin EC 81 MG tablet Take 81 mg by mouth daily.    Marland Kitchen diltiazem (CARDIZEM CD) 300 MG 24 hr capsule Take 1 capsule (300 mg total) by mouth daily. 90 capsule 3  . GARLIC PO Take 1 capsule by mouth daily.    Marland Kitchen glucose blood (ONE TOUCH ULTRA TEST) test strip CHECK BLOOD SUGAR TWICE DAILY.   DX Code: E11.9 100 each 1  . KRILL OIL PO Take 1 capsule by mouth daily.    Glory Rosebush DELICA LANCETS FINE MISC Check blood sugar daily 100 each 12  . tiotropium (SPIRIVA) 18 MCG inhalation capsule Place 18 mcg into inhaler and inhale daily.    . valsartan (DIOVAN) 160  MG tablet valsartan 160 mg tablet     No current facility-administered medications on file prior to visit.      Objective:  Objective  Physical Exam  Constitutional: He is oriented to person, place, and time. Vital signs are normal. He appears well-developed and well-nourished. He is sleeping.  HENT:  Head: Normocephalic and atraumatic.  Mouth/Throat: Oropharynx is clear and moist.  Eyes: Pupils are equal, round, and reactive to light. EOM are normal.  Neck: Normal range of  motion. Neck supple. No thyromegaly present.  Cardiovascular: Normal rate and regular rhythm.  No murmur heard. Pulmonary/Chest: Effort normal and breath sounds normal. No respiratory distress. He has no wheezes. He has no rales. He exhibits no tenderness.  Musculoskeletal: He exhibits edema and tenderness.       Left wrist: He exhibits tenderness and swelling.       Arms:      Left hand: He exhibits decreased range of motion and tenderness.  Neurological: He is alert and oriented to person, place, and time.  Skin: Skin is warm and dry.  Psychiatric: He has a normal mood and affect. His behavior is normal. Judgment and thought content normal.  Nursing note and vitals reviewed.  BP 140/60 (BP Location: Left Arm, Patient Position: Sitting, Cuff Size: Large)   Pulse 71   Temp 98.4 F (36.9 C) (Oral)   Wt (!) 308 lb 3.2 oz (139.8 kg)   SpO2 95%   BMI 40.66 kg/m  Wt Readings from Last 3 Encounters:  11/22/17 (!) 308 lb 3.2 oz (139.8 kg)  08/30/17 (!) 309 lb 3.2 oz (140.3 kg)  07/30/17 (!) 310 lb 6.4 oz (140.8 kg)     Lab Results  Component Value Date   WBC 6.2 06/27/2017   HGB 15.0 06/27/2017   HCT 44.8 06/27/2017   PLT 203.0 06/27/2017   GLUCOSE 76 08/30/2017   CHOL 131 08/30/2017   TRIG 104.0 08/30/2017   HDL 58.60 08/30/2017   LDLDIRECT 59.0 03/01/2017   LDLCALC 52 08/30/2017   ALT 13 08/30/2017   AST 11 08/30/2017   NA 141 08/30/2017   K 3.9 08/30/2017   CL 98 08/30/2017   CREATININE 1.45 08/30/2017   BUN 22 08/30/2017   CO2 36 (H) 08/30/2017   TSH 0.736 09/01/2016   PSA 4.96 (H) 02/26/2015   INR 1.23 09/01/2016   HGBA1C 5.6 08/30/2017   MICROALBUR 1.3 08/30/2017    Dg Knee 3 View Left  Result Date: 06/27/2017 CLINICAL DATA:  Left knee pain, initial encounter EXAM: LEFT KNEE - 3 VIEW COMPARISON:  None. FINDINGS: Significant medial joint space narrowing is noted with near bone-on-bone articulation. Mild osteophytic changes are seen. Lateral and patellofemoral  degenerative changes are noted as well. No acute fracture or dislocation is seen. No joint effusion is noted. IMPRESSION: Tricompartmental degenerative change.  No acute abnormality noted. Electronically Signed   By: Inez Catalina M.D.   On: 06/27/2017 11:26     Assessment & Plan:  Plan  I have discontinued Travis Daniels's olmesartan, colchicine, doxycycline, predniSONE, and HYDROcodone-acetaminophen. I am also having him maintain his albuterol, ONETOUCH DELICA LANCETS FINE, diltiazem, tiotropium, aspirin EC, GARLIC PO, KRILL OIL PO, glucose blood, allopurinol, and valsartan.  No orders of the defined types were placed in this encounter.   Problem List Items Addressed This Visit    None    Visit Diagnoses    Left wrist pain    -  Primary   Relevant Orders  DG Wrist Complete Left   Comprehensive metabolic panel   Uric acid   Need for influenza vaccination       Relevant Orders   Flu vaccine HIGH DOSE PF (Fluzone High dose) (Completed)      Follow-up: Return if symptoms worsen or fail to improve.  Ann Held, DO

## 2017-11-23 LAB — COMPREHENSIVE METABOLIC PANEL
ALT: 9 U/L (ref 0–53)
AST: 10 U/L (ref 0–37)
Albumin: 4 g/dL (ref 3.5–5.2)
Alkaline Phosphatase: 82 U/L (ref 39–117)
BUN: 21 mg/dL (ref 6–23)
CHLORIDE: 99 meq/L (ref 96–112)
CO2: 32 meq/L (ref 19–32)
Calcium: 9.5 mg/dL (ref 8.4–10.5)
Creatinine, Ser: 1.48 mg/dL (ref 0.40–1.50)
GFR: 49.57 mL/min — ABNORMAL LOW (ref >60.00)
Glucose, Bld: 102 mg/dL — ABNORMAL HIGH (ref 70–99)
POTASSIUM: 3.7 meq/L (ref 3.5–5.1)
SODIUM: 142 meq/L (ref 135–145)
Total Bilirubin: 1 mg/dL (ref 0.2–1.2)
Total Protein: 6.6 g/dL (ref 6.0–8.3)

## 2017-11-23 LAB — URIC ACID: URIC ACID, SERUM: 6.6 mg/dL (ref 4.0–7.8)

## 2017-11-27 DIAGNOSIS — C61 Malignant neoplasm of prostate: Secondary | ICD-10-CM | POA: Diagnosis not present

## 2017-11-29 ENCOUNTER — Other Ambulatory Visit: Payer: Self-pay | Admitting: Urology

## 2017-11-29 DIAGNOSIS — L738 Other specified follicular disorders: Secondary | ICD-10-CM | POA: Diagnosis not present

## 2017-11-29 DIAGNOSIS — C61 Malignant neoplasm of prostate: Secondary | ICD-10-CM

## 2017-11-29 DIAGNOSIS — D1801 Hemangioma of skin and subcutaneous tissue: Secondary | ICD-10-CM | POA: Diagnosis not present

## 2017-11-29 DIAGNOSIS — L821 Other seborrheic keratosis: Secondary | ICD-10-CM | POA: Diagnosis not present

## 2017-12-20 ENCOUNTER — Ambulatory Visit
Admission: RE | Admit: 2017-12-20 | Discharge: 2017-12-20 | Disposition: A | Discharge status: Home / Self Care | Payer: Medicare Other | Source: Ambulatory Visit | Attending: Urology | Admitting: Urology

## 2017-12-20 DIAGNOSIS — C61 Malignant neoplasm of prostate: Secondary | ICD-10-CM

## 2017-12-20 DIAGNOSIS — R972 Elevated prostate specific antigen [PSA]: Secondary | ICD-10-CM | POA: Diagnosis not present

## 2017-12-20 MED ORDER — GADOBENATE DIMEGLUMINE 529 MG/ML IV SOLN
20.0000 mL | Freq: Once | INTRAVENOUS | Status: AC | PRN
  Administered 2017-12-20: 20 mL via INTRAVENOUS

## 2017-12-25 DIAGNOSIS — R3129 Other microscopic hematuria: Secondary | ICD-10-CM | POA: Diagnosis not present

## 2017-12-25 DIAGNOSIS — N281 Cyst of kidney, acquired: Secondary | ICD-10-CM | POA: Diagnosis not present

## 2017-12-25 DIAGNOSIS — C61 Malignant neoplasm of prostate: Secondary | ICD-10-CM | POA: Diagnosis not present

## 2018-01-10 ENCOUNTER — Other Ambulatory Visit: Payer: Self-pay | Admitting: Family Medicine

## 2018-01-10 DIAGNOSIS — E1151 Type 2 diabetes mellitus with diabetic peripheral angiopathy without gangrene: Secondary | ICD-10-CM

## 2018-03-01 ENCOUNTER — Ambulatory Visit (INDEPENDENT_AMBULATORY_CARE_PROVIDER_SITE_OTHER): Payer: Medicare Other | Admitting: Family Medicine

## 2018-03-01 ENCOUNTER — Encounter: Payer: Self-pay | Admitting: Family Medicine

## 2018-03-01 VITALS — BP 126/80 | HR 77 | Temp 98.1°F | Resp 18 | Ht 73.0 in | Wt 321.0 lb

## 2018-03-01 DIAGNOSIS — I10 Essential (primary) hypertension: Secondary | ICD-10-CM

## 2018-03-01 DIAGNOSIS — E785 Hyperlipidemia, unspecified: Secondary | ICD-10-CM

## 2018-03-01 DIAGNOSIS — E1065 Type 1 diabetes mellitus with hyperglycemia: Secondary | ICD-10-CM

## 2018-03-01 DIAGNOSIS — M170 Bilateral primary osteoarthritis of knee: Secondary | ICD-10-CM | POA: Diagnosis not present

## 2018-03-01 DIAGNOSIS — R739 Hyperglycemia, unspecified: Secondary | ICD-10-CM | POA: Diagnosis not present

## 2018-03-01 LAB — COMPREHENSIVE METABOLIC PANEL
ALBUMIN: 3.9 g/dL (ref 3.5–5.2)
ALK PHOS: 77 U/L (ref 39–117)
ALT: 12 U/L (ref 0–53)
AST: 12 U/L (ref 0–37)
BILIRUBIN TOTAL: 0.6 mg/dL (ref 0.2–1.2)
BUN: 20 mg/dL (ref 6–23)
CO2: 35 mEq/L — ABNORMAL HIGH (ref 19–32)
CREATININE: 1.46 mg/dL (ref 0.40–1.50)
Calcium: 9.5 mg/dL (ref 8.4–10.5)
Chloride: 100 mEq/L (ref 96–112)
GFR: 50.32 mL/min — ABNORMAL LOW (ref >60.00)
Glucose, Bld: 104 mg/dL — ABNORMAL HIGH (ref 70–99)
Potassium: 3.8 mEq/L (ref 3.5–5.1)
Sodium: 143 mEq/L (ref 135–145)
TOTAL PROTEIN: 6.3 g/dL (ref 6.0–8.3)

## 2018-03-01 LAB — LIPID PANEL
CHOLESTEROL: 142 mg/dL (ref 0–200)
HDL: 46.1 mg/dL (ref >39.00)
LDL Cholesterol: 78 mg/dL (ref 0–99)
NonHDL: 95.8
TRIGLYCERIDES: 87 mg/dL (ref 0.0–149.0)
Total CHOL/HDL Ratio: 3
VLDL: 17.4 mg/dL (ref 0.0–40.0)

## 2018-03-01 LAB — HEMOGLOBIN A1C: Hgb A1c MFr Bld: 5.4 % (ref 4.6–6.5)

## 2018-03-01 MED ORDER — DICLOFENAC SODIUM 1 % TD GEL: 4.0000 g | Freq: Four times a day (QID) | TRANSDERMAL | 2 refills | Status: DC

## 2018-03-01 NOTE — Assessment & Plan Note (Signed)
Encouraged heart healthy diet, increase exercise, avoid trans fats, consider a krill oil cap daily 

## 2018-03-01 NOTE — Assessment & Plan Note (Signed)
Gave pt info for healthy weight and wellness program

## 2018-03-01 NOTE — Progress Notes (Signed)
[name], male    DOB: [birth year]  Age: 72 y.o. MRN: [id number]    Subjective:  Subjective  HPI Travis Daniels presents for f/u bp , chol and glucose.  No complaints.    Review of Systems  Constitutional: Negative for appetite change, diaphoresis, fatigue and unexpected weight change.  Eyes: Negative for pain, redness and visual disturbance.  Respiratory: Negative for cough, chest tightness, shortness of breath and wheezing.   Cardiovascular: Negative for chest pain, palpitations and leg swelling.  Endocrine: Negative for cold intolerance, heat intolerance, polydipsia, polyphagia and polyuria.  Genitourinary: Negative for difficulty urinating, dysuria and frequency.  Neurological: Negative for dizziness, light-headedness, numbness and headaches.    History Past Medical History:  Diagnosis Date  . Arthritis   . B12 deficiency   . COPD (chronic obstructive pulmonary disease) ([place]) 01/2011   FVC 47%, FEV1 49%   . 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 Family history of breast cancer   . Family history of colon cancer   . Family history of pancreatic cancer   . Family history of prostate cancer   . Family history of renal cancer   . Hyperlipidemia   . Hypertension   . Lower extremity edema   . Prostate cancer ([place])   . Secondary pulmonary hypertension 01/2011   RV syst pressure 30-40 mm Hg on ECHO     He has a past surgical history that includes Finger surgery (Right, 11); Vasectomy; Total shoulder arthroplasty (Right, 01/21/2013); Colonoscopy w/ polypectomy (12/19/07); transthoracic echocardiogram (01/2011); Endovenous ablation saphenous vein w/ laser (Left, 09/18/2016); Endovenous ablation saphenous vein w/ laser (Left, 10/09/2016); and Prostate biopsy.   His family history includes Breast cancer in his cousin; CAD in his mother; Cancer in his cousin, father, and maternal grandfather; Cancer (age of onset: 32) in his maternal grandmother;  Epilepsy in his son; Skin cancer in his mother.He reports that he has never smoked. He has never used smokeless tobacco. He reports current alcohol use. He reports that he does not use drugs.  Current Outpatient Medications on File Prior to Visit  Medication Sig Dispense Refill  . albuterol (PROVENTIL HFA;VENTOLIN HFA) 108 (90 BASE) MCG/ACT inhaler Inhale 2 puffs into the lungs every 6 (six) hours as needed for wheezing or shortness of breath.    . allopurinol (ZYLOPRIM) 300 MG tablet TAKE 1 TABLET BY MOUTH EVERY DAY 30 tablet 6  . aspirin EC 81 MG tablet Take 81 mg by mouth daily.    Marland Kitchen diltiazem (CARDIZEM CD) 300 MG 24 hr capsule Take 1 capsule (300 mg total) by mouth daily. 90 capsule 3  . GARLIC PO Take 1 capsule by mouth daily.    Marland Kitchen glucose blood (ONE TOUCH ULTRA TEST) test strip CHECK BLOOD SUGAR TWICE DAILY.   DX Code: E11.9 100 each 1  . KRILL OIL PO Take 1 capsule by mouth daily.    . ONE TOUCH ULTRA TEST test strip CHECK BLOOD SUGAR TWICE DAILY 100 each 4  . ONETOUCH DELICA LANCETS FINE MISC Check blood sugar daily 100 each 12  . tiotropium (SPIRIVA) 18 MCG inhalation capsule Place 18 mcg into inhaler and inhale daily.    . valsartan (DIOVAN) 160 MG tablet valsartan 160 mg tablet     No current facility-administered medications on file prior to visit.      Objective:  Objective  Physical Exam Vitals signs and nursing note reviewed.  Constitutional:  General: He is sleeping.     Appearance: He is well-developed.  HENT:     Head: Normocephalic and atraumatic.  Eyes:     Pupils: Pupils are equal, round, and reactive to light.  Neck:     Musculoskeletal: Normal range of motion and neck supple.     Thyroid: No thyromegaly.  Cardiovascular:     Rate and Rhythm: Normal rate and regular rhythm.     Heart sounds: No murmur.  Pulmonary:     Effort: Pulmonary effort is normal. No respiratory distress.     Breath sounds: Normal breath sounds. No wheezing or rales.  Chest:      Chest wall: No tenderness.  Musculoskeletal:        General: No tenderness.  Skin:    General: Skin is warm and dry.  Neurological:     Mental Status: He is oriented to person, place, and time.  Psychiatric:        Behavior: Behavior normal.        Thought Content: Thought content normal.        Judgment: Judgment normal.    BP 126/80 (BP Location: Right Arm, Patient Position: Sitting, Cuff Size: Large)   Pulse 77   Temp 98.1 F (36.7 C) (Oral)   Resp 18   Ht 6\' 1"  (1.854 m)   Wt (!) 321 lb (145.6 kg)   SpO2 98%   BMI 42.35 kg/m  Wt Readings from Last 3 Encounters:  03/01/18 (!) 321 lb (145.6 kg)  11/22/17 (!) 308 lb 3.2 oz (139.8 kg)  08/30/17 (!) 309 lb 3.2 oz (140.3 kg)     Lab Results  Component Value Date   WBC 6.2 06/27/2017   HGB 15.0 06/27/2017   HCT 44.8 06/27/2017   PLT 203.0 06/27/2017   GLUCOSE 104 (H) 03/01/2018   CHOL 142 03/01/2018   TRIG 87.0 03/01/2018   HDL 46.10 03/01/2018   LDLDIRECT 59.0 03/01/2017   LDLCALC 78 03/01/2018   ALT 12 03/01/2018   AST 12 03/01/2018   NA 143 03/01/2018   K 3.8 03/01/2018   CL 100 03/01/2018   CREATININE 1.46 03/01/2018   BUN 20 03/01/2018   CO2 35 (H) 03/01/2018   TSH 0.736 09/01/2016   PSA 4.96 (H) 02/26/2015   INR 1.23 09/01/2016   HGBA1C 5.4 03/01/2018   MICROALBUR 1.3 08/30/2017    Mr Prostate W Wo Contrast  Result Date: 12/20/2017 CLINICAL DATA:  Elevated PSA. PSA equal 6.7. Prostate biopsy 08/28/2016 demonstrates adenocarcinoma of the LEFT apex Gleason 3+3=6. Additional RIGHT apex and RIGHT lateral base Gleason 3+3=6. Additional RIGHT apex lateral Gleason 3+3=6 EXAM: MR PROSTATE WITHOUT AND WITH CONTRAST TECHNIQUE: Multiplanar multisequence MRI images were obtained of the pelvis centered about the prostate. Pre and post contrast images were obtained. CONTRAST:  19mL MULTIHANCE GADOBENATE DIMEGLUMINE 529 MG/ML IV SOLN COMPARISON:  None. FINDINGS: Prostate: Within the RIGHT apex peripheral zone there is  a 16 mm x 11 mm low signal intensity lesion on T2 weighted imaging (image 19/11. This lesion bulges the capsule but does not appear to extend beyond the capsule. There is subtle restricted diffusion at this site with no measurable lesion but only a subtle decreased signal intensity (image 19 and 20 of series 15 this region does have early post-contrast enhancement (image 22/26. There are no additional suspicious lesions within the peripheral zone. The prostatic capsule is intact. The nodular transitional zone is small and mildly nodular. Seminal vesicles appear normal Volume: 5.5 by  4.1 by 5.2 cm (volume = 61 cm^3) Transcapsular spread:  Absent Seminal vesicle involvement: Absent Neurovascular bundle involvement: Absent Pelvic adenopathy: Absent Bone metastasis: Absent Other findings: None IMPRESSION: 1. Lesion at the RIGHT apex posteriorly bulges the capsule and has suspicious signal intensity on T2 weighted imaging. Lesion has minimal restricted diffusion and mild enhancement. (PI-RADS 4). 2. Mildly nodular transitional zone. No transscapular spread. No lymphadenopathy. Electronically Signed   By: Suzy Bouchard M.D.   On: 12/20/2017 14:59     Assessment & Plan:  Plan  I am having [place] start on diclofenac sodium. I am also having him maintain his albuterol, ONETOUCH DELICA LANCETS FINE, diltiazem, tiotropium, aspirin EC, GARLIC PO, KRILL OIL PO, glucose blood, allopurinol, valsartan, and ONE TOUCH ULTRA TEST.  Meds ordered this encounter  Medications  . diclofenac sodium (VOLTAREN) 1 % GEL    Sig: Apply 4 g topically 4 (four) times daily.    Dispense:  100 g    Refill:  2    Problem List Items Addressed This Visit      Unprioritized   Diabetes mellitus ([place])    Check labs  Eye exam annually       Hyperlipidemia    Encouraged heart healthy diet, increase exercise, avoid trans fats, consider a krill oil cap daily      Hypertension - Primary    Well controlled, no changes  to meds. Encouraged heart healthy diet such as the DASH diet and exercise as tolerated.       Relevant Orders   Lipid panel (Completed)   Hemoglobin A1c (Completed)   Comprehensive metabolic panel (Completed)   Severe obesity (BMI >= 40) ([facility])    Gave pt info for healthy weight and wellness program       Other Visit Diagnoses    Hyperglycemia       Relevant Orders   Lipid panel (Completed)   Hemoglobin A1c (Completed)   Comprehensive metabolic panel (Completed)   Primary osteoarthritis of both knees       Relevant Medications   diclofenac sodium (VOLTAREN) 1 % GEL      Follow-up: Return in about 6 months (around 08/31/2018) for annual exam, fasting.  Ann Held, DO

## 2018-03-01 NOTE — Patient Instructions (Signed)

## 2018-03-01 NOTE — Assessment & Plan Note (Signed)
Well controlled, no changes to meds. Encouraged heart healthy diet such as the DASH diet and exercise as tolerated.  °

## 2018-03-01 NOTE — Assessment & Plan Note (Signed)
Check labs  Eye exam annually

## 2018-03-08 ENCOUNTER — Encounter: Payer: Self-pay | Admitting: *Deleted

## 2018-04-09 DIAGNOSIS — R809 Proteinuria, unspecified: Secondary | ICD-10-CM | POA: Diagnosis not present

## 2018-04-09 DIAGNOSIS — E1122 Type 2 diabetes mellitus with diabetic chronic kidney disease: Secondary | ICD-10-CM | POA: Diagnosis not present

## 2018-04-09 DIAGNOSIS — E785 Hyperlipidemia, unspecified: Secondary | ICD-10-CM | POA: Diagnosis not present

## 2018-04-09 DIAGNOSIS — R3129 Other microscopic hematuria: Secondary | ICD-10-CM | POA: Diagnosis not present

## 2018-04-09 DIAGNOSIS — I129 Hypertensive chronic kidney disease with stage 1 through stage 4 chronic kidney disease, or unspecified chronic kidney disease: Secondary | ICD-10-CM | POA: Diagnosis not present

## 2018-04-09 DIAGNOSIS — N179 Acute kidney failure, unspecified: Secondary | ICD-10-CM | POA: Diagnosis not present

## 2018-04-09 DIAGNOSIS — N183 Chronic kidney disease, stage 3 (moderate): Secondary | ICD-10-CM | POA: Diagnosis not present

## 2018-06-05 ENCOUNTER — Other Ambulatory Visit: Payer: Self-pay | Admitting: Family Medicine

## 2018-06-05 ENCOUNTER — Telehealth (INDEPENDENT_AMBULATORY_CARE_PROVIDER_SITE_OTHER): Payer: Self-pay | Admitting: Orthopaedic Surgery

## 2018-06-05 MED ORDER — ALLOPURINOL 300 MG PO TABS: 300.0000 mg | ORAL_TABLET | Freq: Every day | ORAL | 6 refills | Status: DC

## 2018-06-05 NOTE — Telephone Encounter (Signed)
Patient's dentist states that patient is there for a possible tooth extraction. They were calling to see if patient needed antibiotics or not. Dr.Whitfield said that as long as patient was not diabetic and not immune compromised that he did not need antibiotics.

## 2018-06-05 NOTE — Telephone Encounter (Signed)
Refills sent

## 2018-06-05 NOTE — Telephone Encounter (Signed)
Copied from [place] [id number]. Topic: Quick Communication - Rx Refill/Question >> Jun 05, 2018 10:52 AM Richardo Priest, NT wrote: Medication:  allopurinol (ZYLOPRIM) 300 MG tablet  Has the patient contacted their pharmacy? Yes. Patient stated they would like refill. No longer have any. Last filled on 10/17/2017  Preferred Pharmacy (with phone number or street name):  [id number] [address], [place] [phone] (Phone) [phone] (Fax)  Agent: Please be advised that RX refills may take up to 3 business days. We ask that you follow-up with your pharmacy.

## 2018-07-02 DIAGNOSIS — C61 Malignant neoplasm of prostate: Secondary | ICD-10-CM | POA: Diagnosis not present

## 2018-07-04 DIAGNOSIS — N281 Cyst of kidney, acquired: Secondary | ICD-10-CM | POA: Diagnosis not present

## 2018-07-04 DIAGNOSIS — R3129 Other microscopic hematuria: Secondary | ICD-10-CM | POA: Diagnosis not present

## 2018-07-04 DIAGNOSIS — C61 Malignant neoplasm of prostate: Secondary | ICD-10-CM | POA: Diagnosis not present

## 2018-07-22 ENCOUNTER — Encounter: Payer: Self-pay | Admitting: Family Medicine

## 2018-07-22 ENCOUNTER — Ambulatory Visit (INDEPENDENT_AMBULATORY_CARE_PROVIDER_SITE_OTHER): Payer: Medicare Other | Admitting: Family Medicine

## 2018-07-22 DIAGNOSIS — L03116 Cellulitis of left lower limb: Secondary | ICD-10-CM

## 2018-07-22 MED ORDER — DOXYCYCLINE HYCLATE 100 MG PO TABS: 100.0000 mg | ORAL_TABLET | Freq: Two times a day (BID) | ORAL | 0 refills | Status: DC

## 2018-07-22 NOTE — Progress Notes (Signed)
Virtual Visit via Video Note  I connected with Travis Daniels on 07/22/18 at 11:00 AM EDT by a video enabled telemedicine application and verified that I am speaking with the correct person using two identifiers.  Location: Patient: home Provider: home    I discussed the limitations of evaluation and management by telemedicine and the availability of in person appointments. The patient expressed understanding and agreed to proceed.  History of Present Illness: Pt is home with his wife c/o redness and oozing L foot.  No fever  -- symptoms x 10 days 2 weeks --- his wife did not see it until last night and made him call No fevers, no pain   It was oozing this am    Past Medical History:  Diagnosis Date  . Arthritis   . B12 deficiency   . COPD (chronic obstructive pulmonary disease) ([place]) 01/2011   FVC 47%, FEV1 49%   . 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 Family history of breast cancer   . Family history of colon cancer   . Family history of pancreatic cancer   . Family history of prostate cancer   . Family history of renal cancer   . Hyperlipidemia   . Hypertension   . Lower extremity edema   . Prostate cancer ([place])   . Secondary pulmonary hypertension 01/2011   RV syst pressure 30-40 mm Hg on ECHO    Current Outpatient Medications on File Prior to Visit  Medication Sig Dispense Refill  . albuterol (PROVENTIL HFA;VENTOLIN HFA) 108 (90 BASE) MCG/ACT inhaler Inhale 2 puffs into the lungs every 6 (six) hours as needed for wheezing or shortness of breath.    . allopurinol (ZYLOPRIM) 300 MG tablet Take 1 tablet (300 mg total) by mouth daily. 30 tablet 6  . aspirin EC 81 MG tablet Take 81 mg by mouth daily.    . chlorthalidone (HYGROTON) 25 MG tablet Take 25 mg by mouth daily.    Marland Kitchen diltiazem (CARDIZEM CD) 300 MG 24 hr capsule Take 1 capsule (300 mg total) by mouth daily. 90 capsule 3  . GARLIC PO Take 1 capsule by mouth daily.    Marland Kitchen glucose blood (ONE TOUCH ULTRA  TEST) test strip CHECK BLOOD SUGAR TWICE DAILY.   DX Code: E11.9 100 each 1  . KRILL OIL PO Take 1 capsule by mouth daily.    . ONE TOUCH ULTRA TEST test strip CHECK BLOOD SUGAR TWICE DAILY 100 each 4  . ONETOUCH DELICA LANCETS FINE MISC Check blood sugar daily 100 each 12  . tiotropium (SPIRIVA) 18 MCG inhalation capsule Place 18 mcg into inhaler and inhale daily.    . valsartan (DIOVAN) 160 MG tablet Take 320 mg by mouth daily.      No current facility-administered medications on file prior to visit.     Observations/Objective: Unable to obtain vitals  Afebrile per pt L foot --- + edema and + errythema  Hot per pt  + wound with yellow scab No drainage now  Pt with no covid symptoms  Assessment and Plan: 1. Cellulitis of left foot Elevate leg Keep clean abx per orders We can see him in the office if symptoms do not improve - doxycycline (VIBRA-TABS) 100 MG tablet; Take 1 tablet (100 mg total) by mouth 2 (two) times daily.  Dispense: 20 tablet; Refill: 0  Follow Up Instructions:    I discussed the assessment and treatment plan with the patient. The patient was  provided an opportunity to ask questions and all were answered. The patient agreed with the plan and demonstrated an understanding of the instructions.   The patient was advised to call back or seek an in-person evaluation if the symptoms worsen or if the condition fails to improve as anticipated.  I provided 20 minutes of non-face-to-face time during this encounter.   Ann Held, DO

## 2018-08-12 ENCOUNTER — Telehealth: Payer: Self-pay | Admitting: Pulmonary Disease

## 2018-08-12 NOTE — Telephone Encounter (Signed)
Called and spoke with patient regarding in need of new cpap machine Pt rec'd call from insurance that cpap is over 73 yrs old can get new one Pt not been seen in office by [facility] in over 1yrs needs ov Pt requested to have video visit Scheduled video visit with EW 08/14/18 at 1030am Pt verbalized and expressed understanding Nothing further needed.

## 2018-08-14 ENCOUNTER — Encounter: Payer: Self-pay | Admitting: Primary Care

## 2018-08-14 ENCOUNTER — Other Ambulatory Visit: Payer: Self-pay

## 2018-08-14 ENCOUNTER — Telehealth (INDEPENDENT_AMBULATORY_CARE_PROVIDER_SITE_OTHER): Payer: Medicare Other | Admitting: Primary Care

## 2018-08-14 DIAGNOSIS — G4733 Obstructive sleep apnea (adult) (pediatric): Secondary | ICD-10-CM | POA: Diagnosis not present

## 2018-08-14 NOTE — Patient Instructions (Addendum)
Continue to wear CPAP every night for 4-6 hours or more  Do not drive if experiencing excessive daytime fatigue or somnolence   Needs ONO on CPAP  DME referral for new cpap machine  Follow up in 6 months with Dr. Halford Chessman

## 2018-08-14 NOTE — Progress Notes (Signed)
Virtual Visit via Telephone Note  I connected with Travis Daniels on 08/14/18 at 10:00 AM EDT by telephone and verified that I am speaking with the correct person using two identifiers. Unable to connect by video visit.   Location: Patient: Home Provider: Office   I discussed the limitations, risks, security and privacy concerns of performing an evaluation and management service by telephone and the availability of in person appointments. I also discussed with the patient that there may be a patient responsible charge related to this service. The patient expressed understanding and agreed to proceed.  History of Present Illness: 73 year old male, never smoked. PMH significant for OSA, diabetes mellitus, secondary pulmonary hypertension, PVD, prostate cancer. Patient of Dr. Halford Chessman, last seen on 04/07/16. PSG 2015 showed AHI 30, SpO2 low 74%. Spirometry suggestive of restrictive disease, maintained on prn albuterol.   08/14/2018 Patient called today for OSA follow-up. States that he received a call from [facility] and due for new machine. He is compliant with CPAP use, experiencing no issues. Feels rested in the morning. Reports that he occasionally falls asleep in chair. Get Spiriva from [place]. Uses prn albuterol when chest feels tight, humidity makes it harder to breath. Used his rescue inhaler twice last week. Denies fever, cough, wheezing.    Observations/Objective:  - No shortness of breath, wheezing or cough noted during phone conversation  Spirometry 04/07/16 >> FEV1 2.37 (73%), FEV1% 80  Assessment and Plan:  OSA - Patient is 99% compliant with CPAP and reports benefit from use - Pressure auto titrate 5-20cm H20 (95%- 18cm h20); AHI 2.0  - Hx elevated HCO3   - Needs ONO on CPAP, may need in lab titration study   Restrictive lung disease - Continue Spiriva from [place]  - Albuterol hfa q4-6 hours for breakthrough shortness of breath/wheezing   Obesity - Encourage weight loss and stay  active   Follow Up Instructions:  - 6 month follow up with Dr. Halford Chessman   I discussed the assessment and treatment plan with the patient. The patient was provided an opportunity to ask questions and all were answered. The patient agreed with the plan and demonstrated an understanding of the instructions.   The patient was advised to call back or seek an in-person evaluation if the symptoms worsen or if the condition fails to improve as anticipated.  I provided 22 minutes of non-face-to-face time during this encounter.   Martyn Ehrich, NP

## 2018-08-23 ENCOUNTER — Telehealth: Payer: Self-pay | Admitting: Primary Care

## 2018-08-23 DIAGNOSIS — G4733 Obstructive sleep apnea (adult) (pediatric): Secondary | ICD-10-CM

## 2018-08-23 NOTE — Telephone Encounter (Signed)
Spoke with pt and he stated he did the ONO on 08/21/2018 with his CPAP.  Will call [employer] and get back to PT.

## 2018-08-23 NOTE — Telephone Encounter (Addendum)
Spoke with [employer] Medicare will not approve ONO so CPAP titration ordered per Geraldo Pitter NP. LMOM for pt to return call.

## 2018-08-23 NOTE — Addendum Note (Signed)
Addended by: Lebron Conners on: 08/23/2018 01:40 PM   Modules accepted: Orders

## 2018-08-23 NOTE — Addendum Note (Signed)
Addended by: Karmen Stabs on: 08/23/2018 02:57 PM   Modules accepted: Orders

## 2018-08-27 NOTE — Telephone Encounter (Signed)
Called [facility] and spoke with Tiffany in regards to messages we have received. Stated to her that we received the one message to where pt would need to have a CPAP titration due to medicare not covering ONO with CPAP.  I also stated to Tiffany that we spoke with pt and he said that he did ONO 6/10 with CPAP but per Tiffany, if pt did do the ONO, insurance will not cover this so pt will need to have CPAP titration performed.  Order has been placed for pt to have CPAP titration. Nothing further needed.

## 2018-08-29 ENCOUNTER — Telehealth: Payer: Self-pay | Admitting: Primary Care

## 2018-08-29 NOTE — Telephone Encounter (Signed)
Pt states he did a ONO last week He is checking on the results.

## 2018-08-29 NOTE — Telephone Encounter (Signed)
Called [facility] and spoke to [place]. B/c of dx of OSA pt could not do a ONO under Medicare. He would have to do a cpap titration.  Pt just received his new cpap machine on 6/15. Compliance visit scheduled for 10/14/18 Pt will be contacted re: cpap titration study.  Called patient and explained. Made aware after insurance approves he will be contacted. Nothing further needed at this time.

## 2018-09-02 ENCOUNTER — Other Ambulatory Visit: Payer: Self-pay

## 2018-09-02 ENCOUNTER — Ambulatory Visit (INDEPENDENT_AMBULATORY_CARE_PROVIDER_SITE_OTHER): Payer: Medicare Other | Admitting: Family Medicine

## 2018-09-02 ENCOUNTER — Encounter: Payer: Self-pay | Admitting: Family Medicine

## 2018-09-02 VITALS — Ht 73.0 in

## 2018-09-02 DIAGNOSIS — E785 Hyperlipidemia, unspecified: Secondary | ICD-10-CM

## 2018-09-02 DIAGNOSIS — L03115 Cellulitis of right lower limb: Secondary | ICD-10-CM | POA: Diagnosis not present

## 2018-09-02 DIAGNOSIS — I10 Essential (primary) hypertension: Secondary | ICD-10-CM | POA: Diagnosis not present

## 2018-09-02 DIAGNOSIS — L03116 Cellulitis of left lower limb: Secondary | ICD-10-CM | POA: Diagnosis not present

## 2018-09-02 DIAGNOSIS — E1169 Type 2 diabetes mellitus with other specified complication: Secondary | ICD-10-CM | POA: Diagnosis not present

## 2018-09-02 DIAGNOSIS — M1A09X0 Idiopathic chronic gout, multiple sites, without tophus (tophi): Secondary | ICD-10-CM

## 2018-09-02 MED ORDER — DOXYCYCLINE HYCLATE 100 MG PO TABS: 100.0000 mg | ORAL_TABLET | Freq: Two times a day (BID) | ORAL | 0 refills | Status: DC

## 2018-09-02 NOTE — Progress Notes (Signed)
Virtual Visit via Video Note  I connected with Travis Daniels on 09/02/18 at 10:30 AM EDT by a video enabled telemedicine application and verified that I am speaking with the correct person using two identifiers.  Location: Patient: home  Provider: home    I discussed the limitations of evaluation and management by telemedicine and the availability of in person appointments. The patient expressed understanding and agreed to proceed.  History of Present Illness: Pt is home and needs f/u bp and labs.  He also c/o cellulitis never completely resolving  No other complaints.     Observations/Objective: 133/75  119/70  61    Pt is in nad L foot-- swollen and red R leg--  Swollen and red to just below knee-- no calf pain    Assessment and Plan: 1. Essential hypertension Well controlled, no changes to meds. Encouraged heart healthy diet such as the DASH diet and exercise as tolerated.   - Lipid panel; Future - Hemoglobin A1c; Future - Comprehensive metabolic panel; Future  2. Hyperlipidemia associated with type 2 diabetes mellitus ([place]) Encouraged heart healthy diet, increase exercise, avoid trans fats, consider a krill oil cap daily - Lipid panel; Future - Hemoglobin A1c; Future - Comprehensive metabolic panel; Future  3. Idiopathic chronic gout of multiple sites without tophus Check labs  - Uric acid; Future  4. Cellulitis of right lower extremity Doxy per orders Pt to f/u later this week to recheck cellulitis and labs   5. Cellulitis of left foot abx per orders Pt to f/u in office later this week    Follow Up Instructions:    I discussed the assessment and treatment plan with the patient. The patient was provided an opportunity to ask questions and all were answered. The patient agreed with the plan and demonstrated an understanding of the instructions.   The patient was advised to call back or seek an in-person evaluation if the symptoms worsen or if the condition  fails to improve as anticipated.     Ann Held, DO

## 2018-09-06 ENCOUNTER — Ambulatory Visit (INDEPENDENT_AMBULATORY_CARE_PROVIDER_SITE_OTHER): Payer: Medicare Other | Admitting: Family Medicine

## 2018-09-06 ENCOUNTER — Encounter: Payer: Self-pay | Admitting: Family Medicine

## 2018-09-06 ENCOUNTER — Other Ambulatory Visit: Payer: Self-pay

## 2018-09-06 VITALS — HR 75 | Resp 16 | Ht 73.0 in | Wt 301.2 lb

## 2018-09-06 DIAGNOSIS — L02612 Cutaneous abscess of left foot: Secondary | ICD-10-CM | POA: Diagnosis not present

## 2018-09-06 DIAGNOSIS — L03115 Cellulitis of right lower limb: Secondary | ICD-10-CM | POA: Diagnosis not present

## 2018-09-06 DIAGNOSIS — L03116 Cellulitis of left lower limb: Secondary | ICD-10-CM

## 2018-09-06 DIAGNOSIS — L03032 Cellulitis of left toe: Secondary | ICD-10-CM

## 2018-09-06 MED ORDER — CLINDAMYCIN HCL 150 MG PO CAPS: 450.0000 mg | ORAL_CAPSULE | Freq: Three times a day (TID) | ORAL | 0 refills | Status: DC

## 2018-09-06 NOTE — Assessment & Plan Note (Signed)
Change abx to clindamycin Refer to wound clinic

## 2018-09-06 NOTE — Assessment & Plan Note (Signed)
Refer to wound clinic Change to clindamycin

## 2018-09-06 NOTE — Patient Instructions (Signed)

## 2018-09-06 NOTE — Progress Notes (Signed)
[name], male    DOB: [birth year]  Age: 73 y.o. MRN: [id number]    Subjective:  Subjective  HPI Travis Daniels presents for f/u cellulitis ---  The swelling is a little better but the redness and dry skin is no better.  It gets a little better for a while with vaseline but then starts to bother him again   Review of Systems  Constitutional: Negative for appetite change, diaphoresis, fatigue and unexpected weight change.  Eyes: Negative for pain, redness and visual disturbance.  Respiratory: Negative for cough, chest tightness, shortness of breath and wheezing.   Cardiovascular: Negative for chest pain, palpitations and leg swelling.  Endocrine: Negative for cold intolerance, heat intolerance, polydipsia, polyphagia and polyuria.  Genitourinary: Negative for difficulty urinating, dysuria and frequency.  Skin: Positive for color change, rash and wound.  Neurological: Negative for dizziness, light-headedness, numbness and headaches.    History Past Medical History:  Diagnosis Date  . Arthritis   . B12 deficiency   . COPD (chronic obstructive pulmonary disease) ([place]) 01/2011   FVC 47%, FEV1 49%   . 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 Family history of breast cancer   . Family history of colon cancer   . Family history of pancreatic cancer   . Family history of prostate cancer   . Family history of renal cancer   . Hyperlipidemia   . Hypertension   . Lower extremity edema   . Prostate cancer ([place])   . Secondary pulmonary hypertension 01/2011   RV syst pressure 30-40 mm Hg on ECHO     He has a past surgical history that includes Finger surgery (Right, 11); Vasectomy; Total shoulder arthroplasty (Right, 01/21/2013); Colonoscopy w/ polypectomy (12/19/07); transthoracic echocardiogram (01/2011); Endovenous ablation saphenous vein w/ laser (Left, 09/18/2016); Endovenous ablation saphenous vein w/ laser (Left, 10/09/2016); and Prostate biopsy.    His family history includes Breast cancer in his cousin; CAD in his mother; Cancer in his cousin, father, and maternal grandfather; Cancer (age of onset: 31) in his maternal grandmother; Epilepsy in his son; Skin cancer in his mother.He reports that he has never smoked. He has never used smokeless tobacco. He reports current alcohol use. He reports that he does not use drugs.  Current Outpatient Medications on File Prior to Visit  Medication Sig Dispense Refill  . albuterol (PROVENTIL HFA;VENTOLIN HFA) 108 (90 BASE) MCG/ACT inhaler Inhale 2 puffs into the lungs every 6 (six) hours as needed for wheezing or shortness of breath.    . allopurinol (ZYLOPRIM) 300 MG tablet Take 1 tablet (300 mg total) by mouth daily. 30 tablet 6  . aspirin EC 81 MG tablet Take 81 mg by mouth daily.    . chlorthalidone (HYGROTON) 25 MG tablet Take 25 mg by mouth daily.    Marland Kitchen diltiazem (CARDIZEM CD) 300 MG 24 hr capsule Take 1 capsule (300 mg total) by mouth daily. 90 capsule 3  . GARLIC PO Take 1 capsule by mouth daily.    Marland Kitchen glucose blood (ONE TOUCH ULTRA TEST) test strip CHECK BLOOD SUGAR TWICE DAILY.   DX Code: E11.9 100 each 1  . KRILL OIL PO Take 1 capsule by mouth daily.    . ONE TOUCH ULTRA TEST test strip CHECK BLOOD SUGAR TWICE DAILY 100 each 4  . ONETOUCH DELICA LANCETS FINE MISC Check blood sugar daily 100 each 12  . tiotropium (SPIRIVA) 18 MCG inhalation capsule Place 18 mcg into inhaler  and inhale daily.    . valsartan (DIOVAN) 160 MG tablet Take 320 mg by mouth daily.      No current facility-administered medications on file prior to visit.      Objective:  Objective  Physical Exam Vitals signs and nursing note reviewed.  Constitutional:      General: He is sleeping.     Appearance: He is well-developed.  HENT:     Head: Normocephalic and atraumatic.  Eyes:     Pupils: Pupils are equal, round, and reactive to light.  Neck:     Musculoskeletal: Normal range of motion and neck supple.      Thyroid: No thyromegaly.  Cardiovascular:     Rate and Rhythm: Normal rate and regular rhythm.     Heart sounds: No murmur.  Pulmonary:     Effort: Pulmonary effort is normal. No respiratory distress.     Breath sounds: Normal breath sounds. No wheezing or rales.  Chest:     Chest wall: No tenderness.  Musculoskeletal:        General: No tenderness.  Skin:    General: Skin is warm and dry.     Findings: Erythema and rash present.  Neurological:     Mental Status: He is oriented to person, place, and time.  Psychiatric:        Behavior: Behavior normal.        Thought Content: Thought content normal.        Judgment: Judgment normal.    Pulse 75   Resp 16   Ht 6\' 1"  (1.854 m)   Wt (!) 301 lb 3.2 oz (136.6 kg)   SpO2 98%   BMI 39.74 kg/m  Wt Readings from Last 3 Encounters:  09/06/18 (!) 301 lb 3.2 oz (136.6 kg)  03/01/18 (!) 321 lb (145.6 kg)  11/22/17 (!) 308 lb 3.2 oz (139.8 kg)     Lab Results  Component Value Date   WBC 6.2 06/27/2017   HGB 15.0 06/27/2017   HCT 44.8 06/27/2017   PLT 203.0 06/27/2017   GLUCOSE 104 (H) 03/01/2018   CHOL 142 03/01/2018   TRIG 87.0 03/01/2018   HDL 46.10 03/01/2018   LDLDIRECT 59.0 03/01/2017   LDLCALC 78 03/01/2018   ALT 12 03/01/2018   AST 12 03/01/2018   NA 143 03/01/2018   K 3.8 03/01/2018   CL 100 03/01/2018   CREATININE 1.46 03/01/2018   BUN 20 03/01/2018   CO2 35 (H) 03/01/2018   TSH 0.736 09/01/2016   PSA 4.96 (H) 02/26/2015   INR 1.23 09/01/2016   HGBA1C 5.4 03/01/2018   MICROALBUR 1.3 08/30/2017    Mr Prostate W Wo Contrast  Result Date: 12/20/2017 CLINICAL DATA:  Elevated PSA. PSA equal 6.7. Prostate biopsy 08/28/2016 demonstrates adenocarcinoma of the LEFT apex Gleason 3+3=6. Additional RIGHT apex and RIGHT lateral base Gleason 3+3=6. Additional RIGHT apex lateral Gleason 3+3=6 EXAM: MR PROSTATE WITHOUT AND WITH CONTRAST TECHNIQUE: Multiplanar multisequence MRI images were obtained of the pelvis centered  about the prostate. Pre and post contrast images were obtained. CONTRAST:  88mL MULTIHANCE GADOBENATE DIMEGLUMINE 529 MG/ML IV SOLN COMPARISON:  None. FINDINGS: Prostate: Within the RIGHT apex peripheral zone there is a 16 mm x 11 mm low signal intensity lesion on T2 weighted imaging (image 19/11. This lesion bulges the capsule but does not appear to extend beyond the capsule. There is subtle restricted diffusion at this site with no measurable lesion but only a subtle decreased signal intensity (image 19  and 20 of series 15 this region does have early post-contrast enhancement (image 22/26. There are no additional suspicious lesions within the peripheral zone. The prostatic capsule is intact. The nodular transitional zone is small and mildly nodular. Seminal vesicles appear normal Volume: 5.5 by 4.1 by 5.2 cm (volume = 61 cm^3) Transcapsular spread:  Absent Seminal vesicle involvement: Absent Neurovascular bundle involvement: Absent Pelvic adenopathy: Absent Bone metastasis: Absent Other findings: None IMPRESSION: 1. Lesion at the RIGHT apex posteriorly bulges the capsule and has suspicious signal intensity on T2 weighted imaging. Lesion has minimal restricted diffusion and mild enhancement. (PI-RADS 4). 2. Mildly nodular transitional zone. No transscapular spread. No lymphadenopathy. Electronically Signed   By: Suzy Bouchard M.D.   On: 12/20/2017 14:59     Assessment & Plan:  Plan  I am having Susette Racer maintain his albuterol, OneTouch Delica Lancets Fine, diltiazem, tiotropium, aspirin EC, GARLIC PO, KRILL OIL PO, glucose blood, valsartan, ONE TOUCH ULTRA TEST, allopurinol, chlorthalidone, and clindamycin.  Meds ordered this encounter  Medications  . clindamycin (CLEOCIN) 150 MG capsule    Sig: Take 3 capsules (450 mg total) by mouth 3 (three) times daily. May dispense as 150mg  capsules    Dispense:  90 capsule    Refill:  0    Problem List Items Addressed This Visit       Unprioritized   Cellulitis - Primary   Relevant Orders   Ambulatory referral to Wound Clinic   Cellulitis of left lower leg    Refer to wound clinic Change to clindamycin      Cellulitis of leg, right    Change abx to clindamycin Refer to wound clinic        Other Visit Diagnoses    Cellulitis and abscess of toe of left foot       Relevant Medications   clindamycin (CLEOCIN) 150 MG capsule   Other Relevant Orders   Ambulatory referral to Wound Clinic      Follow-up: Return in about 3 weeks (around 09/27/2018), or if symptoms worsen or fail to improve, for f/u cellulitis.  Ann Held, DO

## 2018-09-09 ENCOUNTER — Telehealth: Payer: Self-pay | Admitting: Family Medicine

## 2018-09-09 ENCOUNTER — Other Ambulatory Visit: Payer: Self-pay | Admitting: Family Medicine

## 2018-09-09 DIAGNOSIS — L02612 Cutaneous abscess of left foot: Secondary | ICD-10-CM

## 2018-09-09 MED ORDER — CLINDAMYCIN HCL 150 MG PO CAPS: 450.0000 mg | ORAL_CAPSULE | Freq: Three times a day (TID) | ORAL | 0 refills | Status: DC

## 2018-09-09 NOTE — Telephone Encounter (Signed)
Please advise 

## 2018-09-09 NOTE — Telephone Encounter (Signed)
Sent again

## 2018-09-09 NOTE — Telephone Encounter (Signed)
[id number] [address], [place] [phone] (Phone) [phone] (Fax)   clindamycin (CLEOCIN) 150 MG capsule  This was sent in on 6/26 but [facility], resend!

## 2018-09-18 ENCOUNTER — Other Ambulatory Visit (HOSPITAL_COMMUNITY): Payer: Medicare Other

## 2018-09-19 ENCOUNTER — Encounter (HOSPITAL_BASED_OUTPATIENT_CLINIC_OR_DEPARTMENT_OTHER): Payer: Medicare Other | Attending: Internal Medicine

## 2018-09-19 DIAGNOSIS — L97822 Non-pressure chronic ulcer of other part of left lower leg with fat layer exposed: Secondary | ICD-10-CM | POA: Insufficient documentation

## 2018-09-19 DIAGNOSIS — J449 Chronic obstructive pulmonary disease, unspecified: Secondary | ICD-10-CM | POA: Diagnosis not present

## 2018-09-19 DIAGNOSIS — G4730 Sleep apnea, unspecified: Secondary | ICD-10-CM | POA: Diagnosis not present

## 2018-09-19 DIAGNOSIS — L97811 Non-pressure chronic ulcer of other part of right lower leg limited to breakdown of skin: Secondary | ICD-10-CM | POA: Insufficient documentation

## 2018-09-19 DIAGNOSIS — I87333 Chronic venous hypertension (idiopathic) with ulcer and inflammation of bilateral lower extremity: Secondary | ICD-10-CM | POA: Insufficient documentation

## 2018-09-19 DIAGNOSIS — I10 Essential (primary) hypertension: Secondary | ICD-10-CM | POA: Insufficient documentation

## 2018-09-19 DIAGNOSIS — I890 Lymphedema, not elsewhere classified: Secondary | ICD-10-CM | POA: Diagnosis not present

## 2018-09-19 DIAGNOSIS — L97521 Non-pressure chronic ulcer of other part of left foot limited to breakdown of skin: Secondary | ICD-10-CM | POA: Insufficient documentation

## 2018-09-19 DIAGNOSIS — L97211 Non-pressure chronic ulcer of right calf limited to breakdown of skin: Secondary | ICD-10-CM | POA: Diagnosis not present

## 2018-09-21 ENCOUNTER — Encounter (HOSPITAL_BASED_OUTPATIENT_CLINIC_OR_DEPARTMENT_OTHER): Payer: Medicare Other

## 2018-09-26 DIAGNOSIS — L97811 Non-pressure chronic ulcer of other part of right lower leg limited to breakdown of skin: Secondary | ICD-10-CM | POA: Diagnosis not present

## 2018-09-26 DIAGNOSIS — S81801A Unspecified open wound, right lower leg, initial encounter: Secondary | ICD-10-CM | POA: Diagnosis not present

## 2018-09-26 DIAGNOSIS — S81802A Unspecified open wound, left lower leg, initial encounter: Secondary | ICD-10-CM | POA: Diagnosis not present

## 2018-09-26 DIAGNOSIS — L97521 Non-pressure chronic ulcer of other part of left foot limited to breakdown of skin: Secondary | ICD-10-CM | POA: Diagnosis not present

## 2018-09-26 DIAGNOSIS — J449 Chronic obstructive pulmonary disease, unspecified: Secondary | ICD-10-CM | POA: Diagnosis not present

## 2018-09-26 DIAGNOSIS — S91302A Unspecified open wound, left foot, initial encounter: Secondary | ICD-10-CM | POA: Diagnosis not present

## 2018-09-26 DIAGNOSIS — L97822 Non-pressure chronic ulcer of other part of left lower leg with fat layer exposed: Secondary | ICD-10-CM | POA: Diagnosis not present

## 2018-09-26 DIAGNOSIS — G4730 Sleep apnea, unspecified: Secondary | ICD-10-CM | POA: Diagnosis not present

## 2018-09-26 DIAGNOSIS — I87333 Chronic venous hypertension (idiopathic) with ulcer and inflammation of bilateral lower extremity: Secondary | ICD-10-CM | POA: Diagnosis not present

## 2018-09-27 ENCOUNTER — Ambulatory Visit: Payer: Medicare Other | Admitting: Family Medicine

## 2018-09-30 ENCOUNTER — Other Ambulatory Visit: Payer: Self-pay

## 2018-09-30 ENCOUNTER — Ambulatory Visit (INDEPENDENT_AMBULATORY_CARE_PROVIDER_SITE_OTHER): Payer: Medicare Other | Admitting: Family Medicine

## 2018-09-30 ENCOUNTER — Encounter: Payer: Self-pay | Admitting: Family Medicine

## 2018-09-30 VITALS — BP 117/63 | HR 54 | Temp 98.4°F | Resp 18 | Ht 73.0 in | Wt 300.4 lb

## 2018-09-30 DIAGNOSIS — E119 Type 2 diabetes mellitus without complications: Secondary | ICD-10-CM

## 2018-09-30 DIAGNOSIS — M1A09X0 Idiopathic chronic gout, multiple sites, without tophus (tophi): Secondary | ICD-10-CM | POA: Diagnosis not present

## 2018-09-30 DIAGNOSIS — L03115 Cellulitis of right lower limb: Secondary | ICD-10-CM | POA: Diagnosis not present

## 2018-09-30 DIAGNOSIS — E1169 Type 2 diabetes mellitus with other specified complication: Secondary | ICD-10-CM | POA: Diagnosis not present

## 2018-09-30 DIAGNOSIS — E785 Hyperlipidemia, unspecified: Secondary | ICD-10-CM

## 2018-09-30 DIAGNOSIS — I10 Essential (primary) hypertension: Secondary | ICD-10-CM | POA: Diagnosis not present

## 2018-09-30 DIAGNOSIS — H1010 Acute atopic conjunctivitis, unspecified eye: Secondary | ICD-10-CM

## 2018-09-30 MED ORDER — AZELASTINE HCL 0.05 % OP SOLN: 1.0000 [drp] | Freq: Two times a day (BID) | OPHTHALMIC | 12 refills | Status: DC

## 2018-09-30 NOTE — Assessment & Plan Note (Signed)
Well controlled, no changes to meds. Encouraged heart healthy diet such as the DASH diet and exercise as tolerated.  °

## 2018-09-30 NOTE — Assessment & Plan Note (Signed)
Legs almost completely healed Pt to see wound clinic today about L hand

## 2018-09-30 NOTE — Patient Instructions (Signed)

## 2018-09-30 NOTE — Progress Notes (Signed)
[name], male    DOB: [birth year]  Age: 73 y.o. MRN: [id number]    Subjective:  Subjective  HPI Travis Daniels presents for f/u cellulitis   He is going to the wound clinic and his legs are almost completely better.  He has a wound on his L hand but he is going to the wound clinic this afternoon  Pt only other complaints is red itchy eyes   Review of Systems  Constitutional: Negative for appetite change, diaphoresis, fatigue and unexpected weight change.  Eyes: Positive for redness. Negative for pain, discharge and visual disturbance.  Respiratory: Negative for cough, chest tightness, shortness of breath and wheezing.   Cardiovascular: Negative for chest pain, palpitations and leg swelling.  Endocrine: Negative for cold intolerance, heat intolerance, polydipsia, polyphagia and polyuria.  Genitourinary: Negative for difficulty urinating, dysuria and frequency.  Skin: Positive for color change and rash.  Neurological: Negative for dizziness, light-headedness, numbness and headaches.    History Past Medical History:  Diagnosis Date   Arthritis    B12 deficiency    COPD (chronic obstructive pulmonary disease) ([place]) 01/2011   FVC 47%, FEV1 49%    Cough    associated with exposure to lumber yard   Family history of breast cancer    Family history of colon cancer    Family history of pancreatic cancer    Family history of prostate cancer    Family history of renal cancer    Hyperlipidemia    Hypertension    Lower extremity edema    Prostate cancer ([place])    Secondary pulmonary hypertension 01/2011   RV syst pressure 30-40 mm Hg on ECHO     He has a past surgical history that includes Finger surgery (Right, 11); Vasectomy; Total shoulder arthroplasty (Right, 01/21/2013); Colonoscopy w/ polypectomy (12/19/07); transthoracic echocardiogram (01/2011); Endovenous ablation saphenous vein w/ laser (Left, 09/18/2016); Endovenous ablation saphenous vein w/  laser (Left, 10/09/2016); and Prostate biopsy.   His family history includes Breast cancer in his cousin; CAD in his mother; Cancer in his cousin, father, and maternal grandfather; Cancer (age of onset: 7) in his maternal grandmother; Epilepsy in his son; Skin cancer in his mother.He reports that he has never smoked. He has never used smokeless tobacco. He reports current alcohol use. He reports that he does not use drugs.  Current Outpatient Medications on File Prior to Visit  Medication Sig Dispense Refill   albuterol (PROVENTIL HFA;VENTOLIN HFA) 108 (90 BASE) MCG/ACT inhaler Inhale 2 puffs into the lungs every 6 (six) hours as needed for wheezing or shortness of breath.     allopurinol (ZYLOPRIM) 300 MG tablet Take 1 tablet (300 mg total) by mouth daily. 30 tablet 6   aspirin EC 81 MG tablet Take 81 mg by mouth daily.     chlorthalidone (HYGROTON) 25 MG tablet Take 25 mg by mouth daily.     diltiazem (CARDIZEM CD) 300 MG 24 hr capsule Take 1 capsule (300 mg total) by mouth daily. 90 capsule 3   GARLIC PO Take 1 capsule by mouth daily.     glucose blood (ONE TOUCH ULTRA TEST) test strip CHECK BLOOD SUGAR TWICE DAILY.   DX Code: E11.9 100 each 1   KRILL OIL PO Take 1 capsule by mouth daily.     ONE TOUCH ULTRA TEST test strip CHECK BLOOD SUGAR TWICE DAILY 100 each 4   ONETOUCH DELICA LANCETS FINE MISC Check blood sugar daily 100 each 12  tiotropium (SPIRIVA) 18 MCG inhalation capsule Place 18 mcg into inhaler and inhale daily.     valsartan (DIOVAN) 160 MG tablet Take 320 mg by mouth daily.      clindamycin (CLEOCIN) 150 MG capsule Take 3 capsules (450 mg total) by mouth 3 (three) times daily. May dispense as 150mg  capsules (Patient not taking: Reported on 09/30/2018) 90 capsule 0   No current facility-administered medications on file prior to visit.      Objective:  Objective  Physical Exam Vitals signs and nursing note reviewed.  Constitutional:      General: He is  sleeping.     Appearance: He is well-developed.  HENT:     Head: Normocephalic and atraumatic.  Eyes:     Pupils: Pupils are equal, round, and reactive to light.  Neck:     Musculoskeletal: Normal range of motion and neck supple.     Thyroid: No thyromegaly.  Cardiovascular:     Rate and Rhythm: Normal rate and regular rhythm.     Heart sounds: No murmur.  Pulmonary:     Effort: Pulmonary effort is normal. No respiratory distress.     Breath sounds: Normal breath sounds. No wheezing or rales.  Chest:     Chest wall: No tenderness.  Musculoskeletal:        General: No tenderness.       Arms:  Skin:    General: Skin is warm and dry.     Findings: Erythema and rash present.       Neurological:     Mental Status: He is oriented to person, place, and time.  Psychiatric:        Behavior: Behavior normal.        Thought Content: Thought content normal.        Judgment: Judgment normal.    BP 117/63 (BP Location: Left Arm, Patient Position: Sitting, Cuff Size: Large)    Pulse (!) 54    Temp 98.4 F (36.9 C) (Oral)    Resp 18    Ht 6\' 1"  (1.854 m)    Wt (!) 300 lb 6.4 oz (136.3 kg)    SpO2 98%    BMI 39.63 kg/m  Wt Readings from Last 3 Encounters:  09/30/18 (!) 300 lb 6.4 oz (136.3 kg)  09/06/18 (!) 301 lb 3.2 oz (136.6 kg)  03/01/18 (!) 321 lb (145.6 kg)     Lab Results  Component Value Date   WBC 6.2 06/27/2017   HGB 15.0 06/27/2017   HCT 44.8 06/27/2017   PLT 203.0 06/27/2017   GLUCOSE 104 (H) 03/01/2018   CHOL 142 03/01/2018   TRIG 87.0 03/01/2018   HDL 46.10 03/01/2018   LDLDIRECT 59.0 03/01/2017   LDLCALC 78 03/01/2018   ALT 12 03/01/2018   AST 12 03/01/2018   NA 143 03/01/2018   K 3.8 03/01/2018   CL 100 03/01/2018   CREATININE 1.46 03/01/2018   BUN 20 03/01/2018   CO2 35 (H) 03/01/2018   TSH 0.736 09/01/2016   PSA 4.96 (H) 02/26/2015   INR 1.23 09/01/2016   HGBA1C 5.4 03/01/2018   MICROALBUR 1.3 08/30/2017    Mr Prostate W Wo Contrast  Result  Date: 12/20/2017 CLINICAL DATA:  Elevated PSA. PSA equal 6.7. Prostate biopsy 08/28/2016 demonstrates adenocarcinoma of the LEFT apex Gleason 3+3=6. Additional RIGHT apex and RIGHT lateral base Gleason 3+3=6. Additional RIGHT apex lateral Gleason 3+3=6 EXAM: MR PROSTATE WITHOUT AND WITH CONTRAST TECHNIQUE: Multiplanar multisequence MRI images were obtained of the  pelvis centered about the prostate. Pre and post contrast images were obtained. CONTRAST:  23mL MULTIHANCE GADOBENATE DIMEGLUMINE 529 MG/ML IV SOLN COMPARISON:  None. FINDINGS: Prostate: Within the RIGHT apex peripheral zone there is a 16 mm x 11 mm low signal intensity lesion on T2 weighted imaging (image 19/11. This lesion bulges the capsule but does not appear to extend beyond the capsule. There is subtle restricted diffusion at this site with no measurable lesion but only a subtle decreased signal intensity (image 19 and 20 of series 15 this region does have early post-contrast enhancement (image 22/26. There are no additional suspicious lesions within the peripheral zone. The prostatic capsule is intact. The nodular transitional zone is small and mildly nodular. Seminal vesicles appear normal Volume: 5.5 by 4.1 by 5.2 cm (volume = 61 cm^3) Transcapsular spread:  Absent Seminal vesicle involvement: Absent Neurovascular bundle involvement: Absent Pelvic adenopathy: Absent Bone metastasis: Absent Other findings: None IMPRESSION: 1. Lesion at the RIGHT apex posteriorly bulges the capsule and has suspicious signal intensity on T2 weighted imaging. Lesion has minimal restricted diffusion and mild enhancement. (PI-RADS 4). 2. Mildly nodular transitional zone. No transscapular spread. No lymphadenopathy. Electronically Signed   By: Suzy Bouchard M.D.   On: 12/20/2017 14:59     Assessment & Plan:  Plan  I am having Jasper start on azelastine. I am also having him maintain his albuterol, OneTouch Delica Lancets Fine, diltiazem, tiotropium,  aspirin EC, GARLIC PO, KRILL OIL PO, glucose blood, valsartan, ONE TOUCH ULTRA TEST, allopurinol, chlorthalidone, and clindamycin.  Meds ordered this encounter  Medications   azelastine (OPTIVAR) 0.05 % ophthalmic solution    Sig: Place 1 drop into both eyes 2 (two) times daily.    Dispense:  6 mL    Refill:  12    Problem List Items Addressed This Visit      Unprioritized   Acute atopic conjunctivitis - Primary    optivar sent to pharmacy-- if no relief Refer to opth      Relevant Medications   azelastine (OPTIVAR) 0.05 % ophthalmic solution   Cellulitis    Legs almost completely healed Pt to see wound clinic today about L hand       Diabetes mellitus ([place])    Check labs  con't meds  hgba1c to be checked , minimize simple carbs. Increase exercise as tolerated. Continue current meds       Essential hypertension, benign ([place])    Well controlled, no changes to meds. Encouraged heart healthy diet such as the DASH diet and exercise as tolerated.       Gout   Hyperlipidemia associated with type 2 diabetes mellitus ([place])    Tolerating statin, encouraged heart healthy diet, avoid trans fats, minimize simple carbs and saturated fats. Increase exercise as tolerated      Hypertension    Well controlled, no changes to meds. Encouraged heart healthy diet such as the DASH diet and exercise as tolerated.          Follow-up: Return in about 6 months (around 04/02/2019), or if symptoms worsen or fail to improve, for hypertension, hyperlipidemia, diabetes II.  Ann Held, DO

## 2018-09-30 NOTE — Assessment & Plan Note (Signed)
Check labs  con't meds  hgba1c to be checked , minimize simple carbs. Increase exercise as tolerated. Continue current meds

## 2018-09-30 NOTE — Assessment & Plan Note (Signed)
Tolerating statin, encouraged heart healthy diet, avoid trans fats, minimize simple carbs and saturated fats. Increase exercise as tolerated 

## 2018-09-30 NOTE — Assessment & Plan Note (Signed)
optivar sent to pharmacy-- if no relief Refer to opth

## 2018-10-01 LAB — URIC ACID: Uric Acid, Serum: 6.8 mg/dL (ref 4.0–7.8)

## 2018-10-01 LAB — COMPREHENSIVE METABOLIC PANEL
ALT: 19 U/L (ref 0–53)
AST: 20 U/L (ref 0–37)
Albumin: 4.3 g/dL (ref 3.5–5.2)
Alkaline Phosphatase: 87 U/L (ref 39–117)
BUN: 21 mg/dL (ref 6–23)
CO2: 32 mEq/L (ref 19–32)
Calcium: 9.6 mg/dL (ref 8.4–10.5)
Chloride: 99 mEq/L (ref 96–112)
Creatinine, Ser: 1.41 mg/dL (ref 0.40–1.50)
GFR: 49.21 mL/min — ABNORMAL LOW (ref >60.00)
Glucose, Bld: 85 mg/dL (ref 70–99)
Potassium: 3.8 mEq/L (ref 3.5–5.1)
Sodium: 140 mEq/L (ref 135–145)
Total Bilirubin: 1.1 mg/dL (ref 0.2–1.2)
Total Protein: 6.5 g/dL (ref 6.0–8.3)

## 2018-10-01 LAB — LIPID PANEL
Cholesterol: 150 mg/dL (ref 0–200)
HDL: 47.2 mg/dL (ref >39.00)
LDL Cholesterol: 73 mg/dL (ref 0–99)
NonHDL: 102.97
Total CHOL/HDL Ratio: 3
Triglycerides: 151 mg/dL — ABNORMAL HIGH (ref 0.0–149.0)
VLDL: 30.2 mg/dL (ref 0.0–40.0)

## 2018-10-01 LAB — HEMOGLOBIN A1C: Hgb A1c MFr Bld: 5.4 % (ref 4.6–6.5)

## 2018-10-02 ENCOUNTER — Other Ambulatory Visit (HOSPITAL_COMMUNITY)
Admission: RE | Admit: 2018-10-02 | Discharge: 2018-10-02 | Disposition: A | Discharge status: Home / Self Care | Payer: Medicare Other | Source: Ambulatory Visit | Attending: Internal Medicine | Admitting: Internal Medicine

## 2018-10-02 DIAGNOSIS — Z1159 Encounter for screening for other viral diseases: Secondary | ICD-10-CM | POA: Insufficient documentation

## 2018-10-02 LAB — SARS CORONAVIRUS 2 (TAT 6-24 HRS): SARS Coronavirus 2: NEGATIVE

## 2018-10-03 DIAGNOSIS — L97811 Non-pressure chronic ulcer of other part of right lower leg limited to breakdown of skin: Secondary | ICD-10-CM | POA: Diagnosis not present

## 2018-10-03 DIAGNOSIS — I87329 Chronic venous hypertension (idiopathic) with inflammation of unspecified lower extremity: Secondary | ICD-10-CM | POA: Diagnosis not present

## 2018-10-03 DIAGNOSIS — L97822 Non-pressure chronic ulcer of other part of left lower leg with fat layer exposed: Secondary | ICD-10-CM | POA: Diagnosis not present

## 2018-10-03 DIAGNOSIS — J449 Chronic obstructive pulmonary disease, unspecified: Secondary | ICD-10-CM | POA: Diagnosis not present

## 2018-10-03 DIAGNOSIS — I87333 Chronic venous hypertension (idiopathic) with ulcer and inflammation of bilateral lower extremity: Secondary | ICD-10-CM | POA: Diagnosis not present

## 2018-10-03 DIAGNOSIS — L97521 Non-pressure chronic ulcer of other part of left foot limited to breakdown of skin: Secondary | ICD-10-CM | POA: Diagnosis not present

## 2018-10-03 DIAGNOSIS — L308 Other specified dermatitis: Secondary | ICD-10-CM | POA: Diagnosis not present

## 2018-10-03 DIAGNOSIS — G4730 Sleep apnea, unspecified: Secondary | ICD-10-CM | POA: Diagnosis not present

## 2018-10-05 ENCOUNTER — Ambulatory Visit (HOSPITAL_BASED_OUTPATIENT_CLINIC_OR_DEPARTMENT_OTHER): Payer: Medicare Other | Attending: Primary Care | Admitting: Internal Medicine

## 2018-10-05 DIAGNOSIS — G4733 Obstructive sleep apnea (adult) (pediatric): Secondary | ICD-10-CM | POA: Diagnosis not present

## 2018-10-06 ENCOUNTER — Other Ambulatory Visit: Payer: Self-pay

## 2018-10-07 DIAGNOSIS — E785 Hyperlipidemia, unspecified: Secondary | ICD-10-CM | POA: Diagnosis not present

## 2018-10-07 DIAGNOSIS — I129 Hypertensive chronic kidney disease with stage 1 through stage 4 chronic kidney disease, or unspecified chronic kidney disease: Secondary | ICD-10-CM | POA: Diagnosis not present

## 2018-10-07 DIAGNOSIS — N183 Chronic kidney disease, stage 3 (moderate): Secondary | ICD-10-CM | POA: Diagnosis not present

## 2018-10-07 DIAGNOSIS — E1122 Type 2 diabetes mellitus with diabetic chronic kidney disease: Secondary | ICD-10-CM | POA: Diagnosis not present

## 2018-10-09 ENCOUNTER — Telehealth: Payer: Self-pay | Admitting: Primary Care

## 2018-10-09 DIAGNOSIS — G4733 Obstructive sleep apnea (adult) (pediatric): Secondary | ICD-10-CM

## 2018-10-09 NOTE — Procedures (Signed)
Patient Name: Travis Daniels, [place] Date: 10/05/2018 Gender: Male D.O.B: [birth year] Age (years): 18 Referring Provider: Geraldo Pitter NP Height (inches): 73 Interpreting Physician: Baird Lyons MD, ABSM Weight (lbs): 300 RPSGT: [place], [place] BMI: 40 MRN: [id number] Neck Size: 17.50  CLINICAL INFORMATION The patient is referred for a CPAP titration to treat sleep apnea.  Date of NPSG, Split Night or HST:   NPSG 01/29/14  AHI 30/ hr, desaturation to 74%, body weight 311 lbs  SLEEP STUDY TECHNIQUE As per the AASM Manual for the Scoring of Sleep and Associated Events v2.3 (April 2016) with a hypopnea requiring 4% desaturations.  The channels recorded and monitored were frontal, central and occipital EEG, electrooculogram (EOG), submentalis EMG (chin), nasal and oral airflow, thoracic and abdominal wall motion, anterior tibialis EMG, snore microphone, electrocardiogram, and pulse oximetry. Continuous positive airway pressure (CPAP) was initiated at the beginning of the study and titrated to treat sleep-disordered breathing.  MEDICATIONS Medications self-administered by patient taken the night of the study : none reported  TECHNICIAN COMMENTS Comments added by technician: The patient slept with bed wedge and four pillows. He sleeps on a recliner at home. Comments added by scorer: N/A  RESPIRATORY PARAMETERS Optimal PAP Pressure (cm): 12 AHI at Optimal Pressure (/hr): 0.0 Overall Minimal O2 (%): 85.0 Supine % at Optimal Pressure (%): 0 Minimal O2 at Optimal Pressure (%): 86.0   SLEEP ARCHITECTURE The study was initiated at 10:43:38 PM and ended at 4:52:54 AM.  Sleep onset time was 106.5 minutes and the sleep efficiency was 62.6%%. The total sleep time was 231 minutes.  The patient spent 8.4%% of the night in stage N1 sleep, 47.4%% in stage N2 sleep, 33.1%% in stage N3 and 11% in REM.Stage REM latency was 115.0 minutes  Wake after sleep onset was 31.8. Alpha intrusion  was absent. Supine sleep was 0.00%.  CARDIAC DATA The 2 lead EKG demonstrated sinus rhythm. The mean heart rate was 45.8 beats per minute. Other EKG findings include: PVCs.  LEG MOVEMENT DATA The total Periodic Limb Movements of Sleep (PLMS) were 0. The PLMS index was 0.0. A PLMS index of <15 is considered normal in adults.  IMPRESSIONS - The optimal PAP pressure was 13 cm of water. - Central sleep apnea was not noted during this titration (CAI = 0.0/h). - Moderate oxygen desaturations were observed during this titration (min O2 = 85.0%). Mean sat at CPAP 13 was 90.8%. - No snoring was audible during this study. - 2-lead EKG demonstrated: PVCs - Clinically significant periodic limb movements were not noted during this study. Arousals associated with PLMs were rare.  DIAGNOSIS - Obstructive Sleep Apnea (327.23 [G47.33 ICD-10])  RECOMMENDATIONS - Trial of CPAP therapy on 13 cm H2O or autopap 10- 15. - Patient used a Large size Fisher&Paykel Full Face Mask Simplus mask and heated humidification. - Be careful with alcohol, sedatives and other CNS depressants that may worsen sleep apnea and disrupt normal sleep architecture. - Sleep hygiene should be reviewed to assess factors that may improve sleep quality. - Weight management and regular exercise should be initiated or continued.  [Electronically signed] 10/09/2018 01:22 PM  Baird Lyons MD, [place], American Board of Sleep Medicine   NPI: [id number]                        [place], [facility]  ELECTRONICALLY SIGNED ON:  10/09/2018, 1:21 PM [place] PH: [phone]   FX: [phone] [place]

## 2018-10-09 NOTE — Telephone Encounter (Signed)
Has televisit 8/3 to discuss split night sleep study

## 2018-10-14 ENCOUNTER — Encounter: Payer: Self-pay | Admitting: Primary Care

## 2018-10-14 ENCOUNTER — Ambulatory Visit (INDEPENDENT_AMBULATORY_CARE_PROVIDER_SITE_OTHER): Payer: Medicare Other | Admitting: Primary Care

## 2018-10-14 ENCOUNTER — Other Ambulatory Visit: Payer: Self-pay

## 2018-10-14 VITALS — BP 102/60 | HR 64 | Ht 73.0 in | Wt 302.8 lb

## 2018-10-14 DIAGNOSIS — L239 Allergic contact dermatitis, unspecified cause: Secondary | ICD-10-CM | POA: Insufficient documentation

## 2018-10-14 DIAGNOSIS — G4733 Obstructive sleep apnea (adult) (pediatric): Secondary | ICD-10-CM | POA: Diagnosis not present

## 2018-10-14 NOTE — Assessment & Plan Note (Signed)
-   100% compliance to CPAP use  - Hx elevated HCO3, most recent level normal  - Completed sleep titration study 7/25 that showed moderate oxygen desaturation with optimal pressure 13cm h20 - DME order to change CPAP therapy to auto 10-15 cm H20.  - Needs large size Fisher & Paykel full face mask. Simplus mask and heated humidification  - FU in 6 months with Dr. Halford Chessman

## 2018-10-14 NOTE — Progress Notes (Signed)
@Patient  ID: Travis Daniels, male    DOB: [birth year], 73 y.o.   MRN: [id number]  Chief Complaint  Patient presents with  . Follow-up    CPAP    Referring provider: Ann Held, *  HPI: 73 year old male, never smoked. PMH significant for OSA, diabetes mellitus, secondary pulmonary hypertension, PVD, prostate cancer. Patient of Dr. Halford Chessman, last seen on 04/07/16. PSG 2015 showed AHI 30, SpO2 low 74%. Spirometry suggestive of restrictive disease, maintained on prn albuterol.   Previous [facility] encounter  08/14/2018 Patient called today for OSA follow-up. States that he received a call from [facility] [facility] and due for new machine. He is compliant with CPAP use, experiencing no issues. Feels rested in the morning. Reports that he occasionally falls asleep in chair. Get Spiriva from [place]. Uses prn albuterol when chest feels tight, humidity makes it harder to breath. Used his rescue inhaler twice last week. Denies fever, cough, wheezing.   10/14/2018 Patient presents today to review CPAP titration results . Fells well, no acute complaints expect for allergy symptoms. Breathing has been fine, continues using Spiriva as directed. Most recent HCO3 was normal. Sleep titration study showed moderate oxygen desaturation(min O2= 85%), mean sat at CPAP 13 was 90/8%. He did not need oxygen. Leg swelling at baseline wearing compression stockings. Dermatitis to right hand/forearm.    PFTs Spirometry 04/07/16 >> FEV1 2.37 (73%), FEV1% 80   No Known Allergies  Immunization History  Administered Date(s) Administered  . Influenza Split 11/11/2012, 12/11/2012  . Influenza, High Dose Seasonal PF 12/03/2014, 11/22/2017  . Influenza,inj,Quad PF,6+ Mos 11/21/2013  . Influenza-Unspecified 01/05/2016  . Pneumococcal Conjugate-13 11/12/2011  . Pneumococcal Polysaccharide-23 09/30/2012  . Tdap 03/25/2013  . Zoster 10/01/2012    Past Medical History:  Diagnosis Date  . Arthritis   . B12 deficiency    . COPD (chronic obstructive pulmonary disease) ([place]) 01/2011   FVC 47%, FEV1 49%   . 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 Family history of breast cancer   . Family history of colon cancer   . Family history of pancreatic cancer   . Family history of prostate cancer   . Family history of renal cancer   . Hyperlipidemia   . Hypertension   . Lower extremity edema   . Prostate cancer ([place])   . Secondary pulmonary hypertension 01/2011   RV syst pressure 30-40 mm Hg on ECHO     Tobacco History: Social History   Tobacco Use  Smoking Status Never Smoker  Smokeless Tobacco Never Used  Tobacco Comment   occ alcohol   Counseling given: Not Answered Comment: occ alcohol   Outpatient Medications Prior to Visit  Medication Sig Dispense Refill  . albuterol (PROVENTIL HFA;VENTOLIN HFA) 108 (90 BASE) MCG/ACT inhaler Inhale 2 puffs into the lungs every 6 (six) hours as needed for wheezing or shortness of breath.    . allopurinol (ZYLOPRIM) 300 MG tablet Take 1 tablet (300 mg total) by mouth daily. 30 tablet 6  . aspirin EC 81 MG tablet Take 81 mg by mouth daily.    Marland Kitchen azelastine (OPTIVAR) 0.05 % ophthalmic solution Place 1 drop into both eyes 2 (two) times daily. 6 mL 12  . chlorthalidone (HYGROTON) 25 MG tablet Take 25 mg by mouth daily.    . clindamycin (CLEOCIN) 150 MG capsule Take 3 capsules (450 mg total) by mouth 3 (three) times daily. May dispense as 150mg  capsules 90 capsule 0  .  diltiazem (CARDIZEM CD) 300 MG 24 hr capsule Take 1 capsule (300 mg total) by mouth daily. 90 capsule 3  . GARLIC PO Take 1 capsule by mouth daily.    Marland Kitchen glucose blood (ONE TOUCH ULTRA TEST) test strip CHECK BLOOD SUGAR TWICE DAILY.   DX Code: E11.9 100 each 1  . KRILL OIL PO Take 1 capsule by mouth daily.    . ONE TOUCH ULTRA TEST test strip CHECK BLOOD SUGAR TWICE DAILY 100 each 4  . ONETOUCH DELICA LANCETS FINE MISC Check blood sugar daily 100 each 12  . tiotropium (SPIRIVA) 18 MCG  inhalation capsule Place 18 mcg into inhaler and inhale daily.    . valsartan (DIOVAN) 160 MG tablet Take 320 mg by mouth daily.      No facility-administered medications prior to visit.    Review of Systems  Review of Systems  Constitutional: Negative.   Eyes: Positive for redness.  Respiratory: Negative.   Cardiovascular: Positive for leg swelling.  Skin: Positive for rash.   Physical Exam  BP 102/60 (BP Location: Left Arm, Cuff Size: Normal)   Pulse 64   Ht 6\' 1"  (1.854 m)   Wt (!) 302 lb 12.8 oz (137.3 kg)   SpO2 96%   BMI 39.95 kg/m  Physical Exam Constitutional:      Appearance: Normal appearance. He is obese. He is not ill-appearing.  HENT:     Head: Normocephalic and atraumatic.  Eyes:     Conjunctiva/sclera: Conjunctivae normal.     Pupils: Pupils are equal, round, and reactive to light.  Neck:     Musculoskeletal: Normal range of motion and neck supple.  Cardiovascular:     Rate and Rhythm: Normal rate and regular rhythm.     Comments: +2-3 BLE lymphedema  Pulmonary:     Effort: Pulmonary effort is normal.     Breath sounds: Normal breath sounds.  Musculoskeletal: Normal range of motion.  Skin:    General: Skin is warm.     Comments: Erythematous dermatitis to right hard/forearm   Neurological:     Mental Status: He is alert.  Psychiatric:        Mood and Affect: Mood normal.        Behavior: Behavior normal.        Thought Content: Thought content normal.        Judgment: Judgment normal.      Lab Results:  CBC    Component Value Date/Time   WBC 6.2 06/27/2017 1045   RBC 5.12 06/27/2017 1045   HGB 15.0 06/27/2017 1045   HCT 44.8 06/27/2017 1045   PLT 203.0 06/27/2017 1045   MCV 87.6 06/27/2017 1045   MCH 28.3 02/19/2017 0330   MCHC 33.5 06/27/2017 1045   RDW 15.0 06/27/2017 1045   LYMPHSABS 1.4 06/27/2017 1045   MONOABS 0.7 06/27/2017 1045   EOSABS 0.2 06/27/2017 1045   BASOSABS 0.1 06/27/2017 1045    BMET    Component Value  Date/Time   NA 140 09/30/2018 1442   K 3.8 09/30/2018 1442   CL 99 09/30/2018 1442   CO2 32 09/30/2018 1442   GLUCOSE 85 09/30/2018 1442   BUN 21 09/30/2018 1442   CREATININE 1.41 09/30/2018 1442   CREATININE 1.25 02/26/2015 1501   CALCIUM 9.6 09/30/2018 1442   GFRNONAA 46 (L) 02/19/2017 0330   GFRAA 54 (L) 02/19/2017 0330    BNP    Component Value Date/Time   BNP 19.0 09/09/2012 1642  ProBNP    Component Value Date/Time   PROBNP 331.4 (H) 01/21/2013 2130    Imaging: No results found.   Assessment & Plan:   OSA (obstructive sleep apnea) - 100% compliance to CPAP use  - Hx elevated HCO3, most recent level normal  - Completed sleep titration study 7/25 that showed moderate oxygen desaturation with optimal pressure 13cm h20 - DME order to change CPAP therapy to auto 10-15 cm H20.  - Needs large size Fisher & Paykel full face mask. Simplus mask and heated humidification  - FU in 6 months with Dr. Halford Chessman   COPD (chronic obstructive pulmonary disease) ([place]) - Stable - Continue Spiriva handihaler and prn albuterol   Allergic dermatitis - Refer to dermatology    Martyn Ehrich, NP 10/14/2018

## 2018-10-14 NOTE — Assessment & Plan Note (Addendum)
-   Stable - Continue Spiriva handihaler and prn albuterol

## 2018-10-14 NOTE — Assessment & Plan Note (Signed)
Refer to dermatology 

## 2018-10-14 NOTE — Patient Instructions (Addendum)
Please place DME order to change CPAP therapy to 10-15 H20. Needs large size Fisher & Paykel full face mask. Simplus mask and heated humidification   Wear CPAP every night for 4-6 hours or more Do not drive if experiencing excessive daytime fatigue or somnolence Work on weight loss   Download in 4 weeks   Referral to dermatology re: dermatitis   Follow-up in 6 months with Dr. Halford Chessman

## 2018-10-25 IMAGING — NM NM PULMONARY VENT & PERF
15 series · 15 of 15 positions shown · non-contrast
Comparison: Chest radiograph February 15, 2017

CLINICAL DATA: History of pulmonary artery hypertension. Lower
extremity edema. Chronic difficulty breathing

EXAM:
NUCLEAR MEDICINE VENTILATION - PERFUSION LUNG SCAN
VIEWS:
Anterior, posterior, left lateral, right lateral, RPO, LPO, RAO, LAO
-ventilation and perffusion
RADIOPHARMACEUTICALS:  32.0 mCi Fechnetium-OOm DTPA aerosol
inhalation and 4.29 mCi Fechnetium-OOm MAA IV

[Series 1: ant/post vent · 4.14mm/px · 1 of 1 slices shown]
[im 1/1]
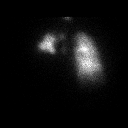

[Series 2: lao/rpo vent · 4.14mm/px · 1 of 1 slices shown (1 of 2)]
[im 1/1]
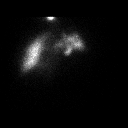

[Series 2: lao/rpo vent · 4.14mm/px · 1 of 1 slices shown (2 of 2)]
[im 1/1]
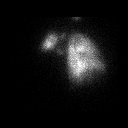

[Series 3: lpo/rao vent · 4.14mm/px · 1 of 1 slices shown (1 of 2)]
[im 1/1]
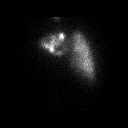

[Series 3: lpo/rao vent · 4.14mm/px · 1 of 1 slices shown (2 of 2)]
[im 1/1]
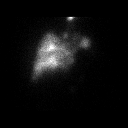

[Series 4: lt lat/rt lat vent · 4.14mm/px · 1 of 1 slices shown (1 of 2)]
[im 1/1]
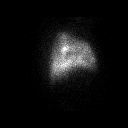

[Series 4: lt lat/rt lat vent · 4.14mm/px · 1 of 1 slices shown (2 of 2)]
[im 1/1]
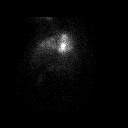

[Series 5: lt lat/rt lat perf · 4.14mm/px · 1 of 1 slices shown (1 of 2)]
[im 1/1]
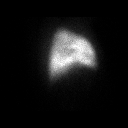

[Series 5: lt lat/rt lat perf · 4.14mm/px · 1 of 1 slices shown (2 of 2)]
[im 1/1]
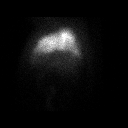

[Series 6: lpo/rao perf · 4.14mm/px · 1 of 1 slices shown (1 of 2)]
[im 1/1]
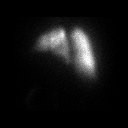

[Series 6: lpo/rao perf · 4.14mm/px · 1 of 1 slices shown (2 of 2)]
[im 1/1]
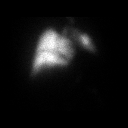

[Series 7: ant/post perf · 4.14mm/px · 1 of 1 slices shown (1 of 2)]
[im 1/1]
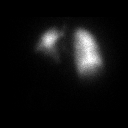

[Series 7: ant/post perf · 4.14mm/px · 1 of 1 slices shown (2 of 2)]
[im 1/1]
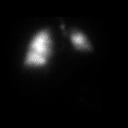

[Series 8: lao/rpo perf · 4.14mm/px · 1 of 1 slices shown (1 of 2)]
[im 1/1]
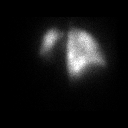

[Series 8: lao/rpo perf · 4.14mm/px · 1 of 1 slices shown (2 of 2)]
[im 1/1]
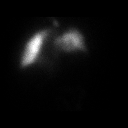

[15 of 15 positions shown; findings below may reference images not displayed]

FINDINGS: Ventilation: There is decreased uptake in the left lung compared to
the right. Chest radiograph shows elevation of the left
hemidiaphragm with relative volume loss on the left compared to the
right. There is no focal segmental or significant subsegmental
ventilation defect.

Perfusion: Decreased uptake is noted in the left base at site of
elevation of left hemidiaphragm and relative volume loss the left
compared to the right, matching the ventilation defect. There are
occasional subsegmental perfusion defects which match ventilation
defects. There is no appreciable ventilation/ perfusion mismatch. No
segmental or significant subsegmental perfusion defect evident.
IMPRESSION: Matching decreased uptake in the left base at the site of elevation
of the left hemidiaphragm and volume loss on chest radiograph. No
segmental or significant subsegmental perfusion defects evident. No
appreciable ventilation/perfusion mismatch. This study overall
constitutes a low probability of pulmonary embolus.

## 2018-10-28 DIAGNOSIS — L738 Other specified follicular disorders: Secondary | ICD-10-CM | POA: Diagnosis not present

## 2018-10-28 DIAGNOSIS — I872 Venous insufficiency (chronic) (peripheral): Secondary | ICD-10-CM | POA: Diagnosis not present

## 2018-10-28 DIAGNOSIS — L2089 Other atopic dermatitis: Secondary | ICD-10-CM | POA: Diagnosis not present

## 2018-10-29 NOTE — Progress Notes (Signed)
Reviewed and agree with assessment/plan.   Susan Bleich, MD [facility] 03/08/2016, 12:24 PM Pager:  [phone]  

## 2018-11-04 DIAGNOSIS — I872 Venous insufficiency (chronic) (peripheral): Secondary | ICD-10-CM | POA: Diagnosis not present

## 2018-11-11 DIAGNOSIS — I872 Venous insufficiency (chronic) (peripheral): Secondary | ICD-10-CM | POA: Diagnosis not present

## 2018-11-11 DIAGNOSIS — L738 Other specified follicular disorders: Secondary | ICD-10-CM | POA: Diagnosis not present

## 2018-11-11 DIAGNOSIS — L2089 Other atopic dermatitis: Secondary | ICD-10-CM | POA: Diagnosis not present

## 2018-11-29 ENCOUNTER — Telehealth: Payer: Self-pay | Admitting: Primary Care

## 2018-11-29 NOTE — Telephone Encounter (Signed)
DL printed and placed on EW desk.

## 2018-11-29 NOTE — Telephone Encounter (Signed)
-----   Message from Martyn Ehrich, NP sent at 10/14/2018 11:09 AM EDT ----- Regarding: cpap download Check down load in 1 month  Changed setting from 10-20 to 10-15 (optimal 13)

## 2018-11-29 NOTE — Telephone Encounter (Signed)
Can you pull a download for me. Non-urgent

## 2018-12-02 NOTE — Telephone Encounter (Signed)
Spoke with pt, aware of results/recs.  Nothing further needed at this time- will close encounter.   

## 2018-12-02 NOTE — Telephone Encounter (Signed)
Download looks great. Pressure 10-15cm H20. AHI 1.2. No change.

## 2018-12-02 NOTE — Telephone Encounter (Signed)
lmtcb X1 for pt to make aware of Beth's recs.

## 2018-12-02 NOTE — Telephone Encounter (Signed)
Pt returning call for results and can be reached @ [phone].Travis Daniels

## 2018-12-06 NOTE — Progress Notes (Signed)
Virtual Visit via Video Note  I connected with patient on 12/09/18 at  1:00 PM EDT by audio enabled telemedicine application and verified that I am speaking with the correct person using two identifiers.   THIS ENCOUNTER IS A VIRTUAL VISIT DUE TO COVID-19 - PATIENT WAS NOT SEEN IN THE OFFICE. PATIENT HAS CONSENTED TO VIRTUAL VISIT / TELEMEDICINE VISIT   Location of patient: home  Location of provider: office  I discussed the limitations of evaluation and management by telemedicine and the availability of in person appointments. The patient expressed understanding and agreed to proceed.   Subjective:   KAADEN BENCOSME is a 73 y.o. male who presents for Medicare Annual/Subsequent preventive examination.  Review of Systems:  Home Safety/Smoke Alarms: Feels safe in home. Smoke alarms in place.  Lives w/ wife and 2 cats in 2 story home. Master on 1st floor.   Male:   CCS- 09/22/14.   F/u in 3 yrs. [facility] and will [facility] to schedule. PSA-  Lab Results  Component Value Date   PSA 4.96 (H) 02/26/2015   PSA 5.18 (H) 08/21/2014       Objective:    Vitals: Unable to assess. This visit is enabled though telemedicine due to Covid 19.  Advanced Directives 12/09/2018 10/05/2018 02/16/2017 02/15/2017 10/16/2016 10/09/2016 09/18/2016  Does Patient Have a Medical Advance Directive? No No No No No No No  Type of Advance Directive - - - - - - -  Would patient like information on creating a medical advance directive? No - Patient declined No - Patient declined No - Patient declined - No - Patient declined - -  Pre-existing out of facility DNR order (yellow form or pink MOST form) - - - - - - -    Tobacco Social History   Tobacco Use  Smoking Status Never Smoker  Smokeless Tobacco Never Used  Tobacco Comment   occ alcohol     Counseling given: Not Answered Comment: occ alcohol   Clinical Intake: Pain : No/denies pain   Past Medical History:  Diagnosis  Date  . Arthritis   . B12 deficiency   . COPD (chronic obstructive pulmonary disease) ([place]) 01/2011   FVC 47%, FEV1 49%   . 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 Family history of breast cancer   . Family history of colon cancer   . Family history of pancreatic cancer   . Family history of prostate cancer   . Family history of renal cancer   . Hyperlipidemia   . Hypertension   . Lower extremity edema   . Prostate cancer ([place])   . Secondary pulmonary hypertension 01/2011   RV syst pressure 30-40 mm Hg on ECHO    Past Surgical History:  Procedure Laterality Date  . 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 Ronelle Nigh at [facility] in [place], [place])  . ENDOVENOUS ABLATION SAPHENOUS VEIN W/ LASER Left 09/18/2016   endovenous laser ablation left greater saphenous vein by Tinnie Gens MD   . ENDOVENOUS ABLATION SAPHENOUS VEIN W/ LASER Left 10/09/2016   endovenous laser ablation left small saphenous vein by Tinnie Gens MD   . FINGER SURGERY Right 11   rt middle finger  . PROSTATE BIOPSY    . TOTAL SHOULDER ARTHROPLASTY Right 01/21/2013   Procedure: SHOULDER HEMI ARTHROPLASTY;  Surgeon: Garald Balding, MD;  Location: [facility];  Service: Orthopedics;  Laterality: Right;  . TRANSTHORACIC ECHOCARDIOGRAM  01/2011   EF 55-60%, mild asymmetric LVH, elevated RV systolic pressure  . VASECTOMY     Family History  Problem Relation Age of Onset  . CAD Mother        MI in her 46s  . Skin cancer Mother        multiple skin cancers removed from face and head  . Cancer Father        colon and pancreas  . Epilepsy Son   . Cancer Maternal Grandmother 56       "stomach"  . Cancer Maternal Grandfather        prostate; died at the age of 63  . Cancer Cousin        paternal first cousin kidney dx under 18  . Breast cancer Cousin        maternal first cousin with breast cancer over 47    Social History   Socioeconomic History  . Marital status: Married    Spouse name: Not on file  . Number of children: 4  . Years of education: Not on file  . Highest education level: Not on file  Occupational History  . Occupation: retired  Scientific laboratory technician  . Financial resource strain: Not on file  . Food insecurity    Worry: Not on file    Inability: Not on file  . Transportation needs    Medical: Not on file    Non-medical: Not on file  Tobacco Use  . Smoking status: Never Smoker  . Smokeless tobacco: Never Used  . Tobacco comment: occ alcohol  Substance and Sexual Activity  . Alcohol use: Yes    Alcohol/week: 0.0 standard drinks    Comment: 2-3 drinks occasionally  . Drug use: No  . Sexual activity: Yes  Lifestyle  . Physical activity    Days per week: Not on file    Minutes per session: Not on file  . Stress: Not on file  Relationships  . Social Herbalist on phone: Not on file    Gets together: Not on file    Attends religious service: Not on file    Active member of club or organization: Not on file    Attends meetings of clubs or organizations: Not on file    Relationship status: Not on file  Other Topics Concern  . Not on file  Social History Narrative   Mr. Flam lives with his wife in [place]. He is recently retired and relocated to [facility] from [place]. Historically, his medical provider has been [facility] in [place].    Outpatient Encounter Medications as of 12/09/2018  Medication Sig  . albuterol (PROVENTIL HFA;VENTOLIN HFA) 108 (90 BASE) MCG/ACT inhaler Inhale 2 puffs into the lungs every 6 (six) hours as needed for wheezing or shortness of breath.  . allopurinol (ZYLOPRIM) 300 MG tablet Take 1 tablet (300 mg total) by mouth daily.  Marland Kitchen aspirin EC 81 MG tablet Take 81 mg by mouth daily.  Marland Kitchen azelastine (OPTIVAR) 0.05 % ophthalmic solution Place 1 drop into both eyes 2 (two) times daily.  . chlorthalidone (HYGROTON) 25 MG tablet Take 25 mg by mouth daily.  Marland Kitchen  diltiazem (CARDIZEM CD) 300 MG 24 hr capsule Take 1 capsule (300 mg total) by mouth daily.  Marland Kitchen GARLIC PO Take 1 capsule by mouth daily.  Marland Kitchen glucose blood (ONE TOUCH ULTRA TEST) test strip CHECK BLOOD SUGAR  TWICE DAILY.   DX Code: E11.9  . KRILL OIL PO Take 1 capsule by mouth daily.  Marland Kitchen losartan (COZAAR) 100 MG tablet Take 100 mg by mouth daily.  . ONE TOUCH ULTRA TEST test strip CHECK BLOOD SUGAR TWICE DAILY  . ONETOUCH DELICA LANCETS FINE MISC Check blood sugar daily  . tiotropium (SPIRIVA) 18 MCG inhalation capsule Place 18 mcg into inhaler and inhale daily.  . clindamycin (CLEOCIN) 150 MG capsule Take 3 capsules (450 mg total) by mouth 3 (three) times daily. May dispense as 150mg  capsules  . [DISCONTINUED] valsartan (DIOVAN) 160 MG tablet Take 320 mg by mouth daily.    No facility-administered encounter medications on file as of 12/09/2018.     Activities of Daily Living In your present state of health, do you have any difficulty performing the following activities: 12/09/2018  Hearing? N  Vision? N  Difficulty concentrating or making decisions? N  Walking or climbing stairs? N  Dressing or bathing? N  Doing errands, shopping? N  Preparing Food and eating ? N  Using the Toilet? N  In the past six months, have you accidently leaked urine? N  Do you have problems with loss of bowel control? N  Managing your Medications? N  Managing your Finances? N  Housekeeping or managing your Housekeeping? N  Some recent data might be hidden    Patient Care Team: Carollee Herter, [name], DO as PCP - General (Family Medicine) Donato Heinz, MD as Consulting Physician (Nephrology)   Assessment:   This is a routine wellness examination for Roland. Physical assessment deferred to PCP.  Exercise Activities and Dietary recommendations Current Exercise Habits: The patient does not participate in regular exercise at present, Exercise limited by: None identified   Diet (meal preparation, eat out,  water intake, caffeinated beverages, dairy products, fruits and vegetables): well balanced, on average, 3 meals per day   Goals    . Increase physical activity       Fall Risk Fall Risk  12/09/2018 10/16/2016 03/09/2016 02/28/2016 02/17/2016  Falls in the past year? 0 No No No No  Comment - - - - Emmi Telephone Survey: data to providers prior to load  Number falls in past yr: - - - - -  Injury with Fall? - - - - -    Depression Screen PHQ 2/9 Scores 12/09/2018 11/22/2017 10/16/2016 03/09/2016  PHQ - 2 Score 0 0 0 0    Cognitive Function Ad8 score reviewed for issues:  Issues making decisions:no  Less interest in hobbies / activities:no  Repeats questions, stories (family complaining):no  Trouble using ordinary gadgets (microwave, computer, phone):no  Forgets the month or year: no  Mismanaging finances: no  Remembering appts: no  Daily problems with thinking and/or memory:no Ad8 score is=0  MMSE - Mini Mental State Exam 03/09/2016  Orientation to time 5  Orientation to Place 5  Registration 3  Attention/ Calculation 5  Recall 3  Language- name 2 objects 2  Language- repeat 1  Language- follow 3 step command 3  Language- read & follow direction 1  Write a sentence 1  Copy design 1  Total score 30        Immunization History  Administered Date(s) Administered  . Influenza Split 11/11/2012, 12/11/2012  . Influenza, High Dose Seasonal PF 12/03/2014, 11/22/2017  . Influenza,inj,Quad PF,6+ Mos 11/21/2013  . Influenza-Unspecified 01/05/2016  . Pneumococcal Conjugate-13 11/12/2011  . Pneumococcal Polysaccharide-23 09/30/2012  . Tdap 03/25/2013  . Zoster 10/01/2012  Screening Tests Health Maintenance  Topic Date Due  . FOOT EXAM  05/05/2015  . OPHTHALMOLOGY EXAM  09/26/2018  . INFLUENZA VACCINE  10/12/2018  . HEMOGLOBIN A1C  04/02/2019  . TETANUS/TDAP  03/26/2023  . COLONOSCOPY  11/24/2024  . Hepatitis C Screening  Completed  . PNA vac Low Risk Adult   Completed        Plan:   See you next year!   Continue to eat heart healthy diet (full of fruits, vegetables, whole grains, lean protein, water--limit salt, fat, and sugar intake) and increase physical activity as tolerated.  Continue doing brain stimulating activities (puzzles, reading, adult coloring books, staying active) to keep memory sharp.    I have personally reviewed and noted the following in the patient's chart:   . Medical and social history . Use of alcohol, tobacco or illicit drugs  . Current medications and supplements . Functional ability and status . Nutritional status . Physical activity . Advanced directives . List of other physicians . Hospitalizations, surgeries, and ER visits in previous 12 months . Vitals . Screenings to include cognitive, depression, and falls . Referrals and appointments  In addition, I have reviewed and discussed with patient certain preventive protocols, quality metrics, and best practice recommendations. A written personalized care plan for preventive services as well as general preventive health recommendations were provided to patient.     Naaman Plummer [place], [place]  12/09/2018

## 2018-12-09 ENCOUNTER — Encounter: Payer: Self-pay | Admitting: *Deleted

## 2018-12-09 ENCOUNTER — Ambulatory Visit (INDEPENDENT_AMBULATORY_CARE_PROVIDER_SITE_OTHER): Payer: Medicare Other | Admitting: *Deleted

## 2018-12-09 ENCOUNTER — Other Ambulatory Visit: Payer: Self-pay

## 2018-12-09 DIAGNOSIS — Z0000 Encounter for general adult medical examination without abnormal findings: Secondary | ICD-10-CM

## 2018-12-09 NOTE — Patient Instructions (Signed)
See you next year!   Continue to eat heart healthy diet (full of fruits, vegetables, whole grains, lean protein, water--limit salt, fat, and sugar intake) and increase physical activity as tolerated.  Continue doing brain stimulating activities (puzzles, reading, adult coloring books, staying active) to keep memory sharp.    Travis Daniels , Thank you for taking time to come for your Medicare Wellness Visit. I appreciate your ongoing commitment to your health goals. Please review the following plan we discussed and let me know if I can assist you in the future.   These are the goals we discussed: Goals    . Increase physical activity       This is a list of the screening recommended for you and due dates:  Health Maintenance  Topic Date Due  . Complete foot exam   05/05/2015  . Eye exam for diabetics  09/26/2018  . Flu Shot  10/12/2018  . Hemoglobin A1C  04/02/2019  . Tetanus Vaccine  03/26/2023  . Colon Cancer Screening  11/24/2024  .  Hepatitis C: One time screening is recommended by Center for Disease Control  (CDC) for  adults born from [birth year] through 1965.   Completed  . Pneumonia vaccines  Completed    Health Maintenance After Age 73 After age 96, you are at a higher risk for certain long-term diseases and infections as well as injuries from falls. Falls are a major cause of broken bones and head injuries in people who are older than age 69. Getting regular preventive care can help to keep you healthy and well. Preventive care includes getting regular testing and making lifestyle changes as recommended by your health care provider. Talk with your health care provider about:  Which screenings and tests you should have. A screening is a test that checks for a disease when you have no symptoms.  A diet and exercise plan that is right for you. What should I know about screenings and tests to prevent falls? Screening and testing are the best ways to find a health problem early. Early  diagnosis and treatment give you the best chance of managing medical conditions that are common after age 57. Certain conditions and lifestyle choices may make you more likely to have a fall. Your health care provider may recommend:  Regular vision checks. Poor vision and conditions such as cataracts can make you more likely to have a fall. If you wear glasses, make sure to get your prescription updated if your vision changes.  Medicine review. Work with your health care provider to regularly review all of the medicines you are taking, including over-the-counter medicines. Ask your health care provider about any side effects that may make you more likely to have a fall. Tell your health care provider if any medicines that you take make you feel dizzy or sleepy.  Osteoporosis screening. Osteoporosis is a condition that causes the bones to get weaker. This can make the bones weak and cause them to break more easily.  Blood pressure screening. Blood pressure changes and medicines to control blood pressure can make you feel dizzy.  Strength and balance checks. Your health care provider may recommend certain tests to check your strength and balance while standing, walking, or changing positions.  Foot health exam. Foot pain and numbness, as well as not wearing proper footwear, can make you more likely to have a fall.  Depression screening. You may be more likely to have a fall if you have a fear of falling,  feel emotionally low, or feel unable to do activities that you used to do.  Alcohol use screening. Using too much alcohol can affect your balance and may make you more likely to have a fall. What actions can I take to lower my risk of falls? General instructions  Talk with your health care provider about your risks for falling. Tell your health care provider if: ? You fall. Be sure to tell your health care provider about all falls, even ones that seem minor. ? You feel dizzy, sleepy, or  off-balance.  Take over-the-counter and prescription medicines only as told by your health care provider. These include any supplements.  Eat a healthy diet and maintain a healthy weight. A healthy diet includes low-fat dairy products, low-fat (lean) meats, and fiber from whole grains, beans, and lots of fruits and vegetables. Home safety  Remove any tripping hazards, such as rugs, cords, and clutter.  Install safety equipment such as grab bars in bathrooms and safety rails on stairs.  Keep rooms and walkways well-lit. Activity   Follow a regular exercise program to stay fit. This will help you maintain your balance. Ask your health care provider what types of exercise are appropriate for you.  If you need a cane or walker, use it as recommended by your health care provider.  Wear supportive shoes that have nonskid soles. Lifestyle  Do not drink alcohol if your health care provider tells you not to drink.  If you drink alcohol, limit how much you have: ? 0-1 drink a day for women. ? 0-2 drinks a day for men.  Be aware of how much alcohol is in your drink. In the [place] one drink equals one typical bottle of beer (12 oz), one-half glass of wine (5 oz), or one shot of hard liquor (1 oz).  Do not use any products that contain nicotine or tobacco, such as cigarettes and e-cigarettes. If you need help quitting, ask your health care provider. Summary  Having a healthy lifestyle and getting preventive care can help to protect your health and wellness after age 20.  Screening and testing are the best way to find a health problem early and help you avoid having a fall. Early diagnosis and treatment give you the best chance for managing medical conditions that are more common for people who are older than age 35.  Falls are a major cause of broken bones and head injuries in people who are older than age 14. Take precautions to prevent a fall at home.  Work with your health care provider  to learn what changes you can make to improve your health and wellness and to prevent falls. This information is not intended to replace advice given to you by your health care provider. Make sure you discuss any questions you have with your health care provider. Document Released: 01/10/2017 Document Revised: 06/20/2018 Document Reviewed: 01/10/2017 Elsevier Patient Education  2020 [employer].

## 2018-12-13 ENCOUNTER — Ambulatory Visit (INDEPENDENT_AMBULATORY_CARE_PROVIDER_SITE_OTHER): Payer: Medicare Other

## 2018-12-13 ENCOUNTER — Other Ambulatory Visit: Payer: Self-pay

## 2018-12-13 DIAGNOSIS — Z23 Encounter for immunization: Secondary | ICD-10-CM | POA: Diagnosis not present

## 2018-12-24 DIAGNOSIS — C61 Malignant neoplasm of prostate: Secondary | ICD-10-CM | POA: Diagnosis not present

## 2018-12-31 DIAGNOSIS — C61 Malignant neoplasm of prostate: Secondary | ICD-10-CM | POA: Diagnosis not present

## 2019-01-01 ENCOUNTER — Other Ambulatory Visit: Payer: Self-pay | Admitting: Family Medicine

## 2019-01-07 ENCOUNTER — Encounter: Payer: Self-pay | Admitting: *Deleted

## 2019-01-07 DIAGNOSIS — C61 Malignant neoplasm of prostate: Secondary | ICD-10-CM | POA: Diagnosis not present

## 2019-01-13 DIAGNOSIS — L2089 Other atopic dermatitis: Secondary | ICD-10-CM | POA: Diagnosis not present

## 2019-01-14 ENCOUNTER — Other Ambulatory Visit: Payer: Self-pay

## 2019-01-14 ENCOUNTER — Encounter: Payer: Self-pay | Admitting: Medical Oncology

## 2019-01-14 ENCOUNTER — Ambulatory Visit
Admission: RE | Admit: 2019-01-14 | Discharge: 2019-01-14 | Disposition: A | Discharge status: Home / Self Care | Payer: Medicare Other | Source: Ambulatory Visit | Attending: Radiation Oncology | Admitting: Radiation Oncology

## 2019-01-14 DIAGNOSIS — R609 Edema, unspecified: Secondary | ICD-10-CM | POA: Diagnosis not present

## 2019-01-14 DIAGNOSIS — Z808 Family history of malignant neoplasm of other organs or systems: Secondary | ICD-10-CM | POA: Insufficient documentation

## 2019-01-14 DIAGNOSIS — J449 Chronic obstructive pulmonary disease, unspecified: Secondary | ICD-10-CM | POA: Diagnosis not present

## 2019-01-14 DIAGNOSIS — I272 Pulmonary hypertension, unspecified: Secondary | ICD-10-CM | POA: Insufficient documentation

## 2019-01-14 DIAGNOSIS — C61 Malignant neoplasm of prostate: Secondary | ICD-10-CM | POA: Diagnosis not present

## 2019-01-14 DIAGNOSIS — E538 Deficiency of other specified B group vitamins: Secondary | ICD-10-CM | POA: Diagnosis not present

## 2019-01-14 DIAGNOSIS — M129 Arthropathy, unspecified: Secondary | ICD-10-CM | POA: Diagnosis not present

## 2019-01-14 DIAGNOSIS — Z800 Family history of malignant neoplasm of digestive organs: Secondary | ICD-10-CM | POA: Insufficient documentation

## 2019-01-14 DIAGNOSIS — Z803 Family history of malignant neoplasm of breast: Secondary | ICD-10-CM | POA: Insufficient documentation

## 2019-01-14 DIAGNOSIS — R972 Elevated prostate specific antigen [PSA]: Secondary | ICD-10-CM | POA: Diagnosis not present

## 2019-01-14 DIAGNOSIS — Z8042 Family history of malignant neoplasm of prostate: Secondary | ICD-10-CM | POA: Diagnosis not present

## 2019-01-14 DIAGNOSIS — Z79899 Other long term (current) drug therapy: Secondary | ICD-10-CM | POA: Insufficient documentation

## 2019-01-14 DIAGNOSIS — I10 Essential (primary) hypertension: Secondary | ICD-10-CM | POA: Insufficient documentation

## 2019-01-14 NOTE — Patient Instructions (Signed)
Coronavirus (COVID-19) Are you at risk?  Are you at risk for the Coronavirus (COVID-19)?  To be considered HIGH RISK for Coronavirus (COVID-19), you have to meet the following criteria:  . Traveled to [place], [place], [place], [place] or [place]; or in the [place] to [place], [place], [place], or [place]; and have fever, cough, and shortness of breath within the last 2 weeks of travel OR . Been in close contact with a person diagnosed with COVID-19 within the last 2 weeks and have fever, cough, and shortness of breath . IF YOU DO NOT MEET THESE CRITERIA, YOU ARE CONSIDERED LOW RISK FOR COVID-19.  What to do if you are HIGH RISK for COVID-19?  . If you are having a medical emergency, call 911. . Seek medical care right away. Before you go to a doctor's office, urgent care or emergency department, call ahead and tell them about your recent travel, contact with someone diagnosed with COVID-19, and your symptoms. You should receive instructions from your physician's office regarding next steps of care.  . When you arrive at healthcare provider, tell the healthcare staff immediately you have returned from visiting [place], [place], [place], [place] or [place]; or traveled in the [place] to [place], [place], [place], or [place]; in the last two weeks or you have been in close contact with a person diagnosed with COVID-19 in the last 2 weeks.   . Tell the health care staff about your symptoms: fever, cough and shortness of breath. . After you have been seen by a medical provider, you will be either: o Tested for (COVID-19) and discharged home on quarantine except to seek medical care if symptoms worsen, and asked to  - Stay home and avoid contact with others until you get your results (4-5 days)  - Avoid travel on public transportation if possible (such as bus, train, or airplane) or o Sent to the Emergency Department by EMS for evaluation, COVID-19 testing, and possible  admission depending on your condition and test results.  What to do if you are LOW RISK for COVID-19?  Reduce your risk of any infection by using the same precautions used for avoiding the common cold or flu:  . Wash your hands often with soap and warm water for at least 20 seconds.  If soap and water are not readily available, use an alcohol-based hand sanitizer with at least 60% alcohol.  . If coughing or sneezing, cover your mouth and nose by coughing or sneezing into the elbow areas of your shirt or coat, into a tissue or into your sleeve (not your hands). . Avoid shaking hands with others and consider head nods or verbal greetings only. . Avoid touching your eyes, nose, or mouth with unwashed hands.  . Avoid close contact with people who are sick. . Avoid places or events with large numbers of people in one location, like concerts or sporting events. . Carefully consider travel plans you have or are making. . If you are planning any travel outside or inside the [place], visit the CDC's Travelers' Health webpage for the latest health notices. . If you have some symptoms but not all symptoms, continue to monitor at home and seek medical attention if your symptoms worsen. . If you are having a medical emergency, call 911.   ADDITIONAL HEALTHCARE OPTIONS FOR PATIENTS   Telehealth / e-Visit: [email or web address]         [facility]: [phone]  [facility]: [phone]                   [facility]: [phone]   

## 2019-01-14 NOTE — Progress Notes (Signed)
Radiation Oncology         [phone] ________________________________  Re-Consultation - Conducted via Telephone due to current COVID-19 concerns for limiting patient exposure  Name: Travis Daniels MRN: [id number]  Date: 01/14/2019  DOB: 05/26/1945  HD:[id number] Chase, [name], DO  Alexis Frock, MD   REFERRING PHYSICIAN: Alexis Frock, MD  DIAGNOSIS: 73 y.o. gentleman with Stage T1c adenocarcinoma of the prostate with Gleason score of 3+4, and PSA of 10.3.    ICD-10-CM   1. Malignant neoplasm of prostate ([place])  C61     HISTORY OF PRESENT ILLNESS: Travis Daniels is a 73 y.o. male with a diagnosis of prostate cancer.  He was initially diagnosed with Gleason 3+3 adenocarcinoma of the prostate with PSA of 6.7 in 6 of 2018.  We met with him in consultation on 10/16/2016 to discuss his treatment options at that time. Following our discussion, he wisely elected to proceed with active surveillance.  Since that time, he has continued in close follow-up with Dr Tresa Moore. He was noted to have mild right lateral induration on DRE in 05/2017, but his PSA remained stable at that time. His PSA increased to 16.8 on 11/27/2017 which was further evaluated with prostate MRI on 12/20/2017.  This study showed a 16 mm right apical lesion without evidence of extracapsular extension or adenopathy.   Repeat PSA performed in 06/2018 had decreased to 8.89 but went up to 10.3 on follow-up in October 2020. He was therefore scheduled for MRI-fusion biopsy, performed on 12/31/2018. The prostate volume measured 70 cc, which was only slightly increased from 66 cc in 08/2016.  Out of 12 standard core biopsies and 1 MRI ROI core, 5 were positive.  The maximum Gleason score was 3+4, and this was seen in right mid lateral (two small foci), right apex, and right mid. Gleason 3+3 was seen in right base lateral (small focus) and in the MRI ROI core.  Of note, he also underwent genetics testing on 12/18/2016, which was  negative.  The patient reviewed the PSA and biopsy results with his urologist and given the upstaging of his disease to Gleason 7, he has kindly been referred back to [place] today for discussion of potential radiation treatment options.   PREVIOUS RADIATION THERAPY: No  PAST MEDICAL HISTORY:  Past Medical History:  Diagnosis Date   Arthritis    B12 deficiency    COPD (chronic obstructive pulmonary disease) ([place]) 01/2011   FVC 47%, FEV1 49%    Cough    associated with exposure to lumber yard   Family history of breast cancer    Family history of colon cancer    Family history of pancreatic cancer    Family history of prostate cancer    Family history of renal cancer    Hyperlipidemia    Hypertension    Lower extremity edema    Prostate cancer ([place])    Secondary pulmonary hypertension 01/2011   RV syst pressure 30-40 mm Hg on ECHO       PAST SURGICAL HISTORY: Past Surgical History:  Procedure Laterality Date   COLONOSCOPY W/ POLYPECTOMY  12/19/07   one small polyp, sigmoid diverticulosis, lipoma rectum, agioectasia/AVM in descending colon (Dr. Ronelle Nigh at [facility] in [place], [place])   ENDOVENOUS [place] Left 09/18/2016   endovenous laser ablation left greater saphenous vein by Tinnie Gens MD    ENDOVENOUS ABLATION SAPHENOUS VEIN W/ LASER Left 10/09/2016   endovenous laser ablation left small saphenous  vein by Tinnie Gens MD    FINGER SURGERY Right 11   rt middle finger   PROSTATE BIOPSY     TOTAL SHOULDER ARTHROPLASTY Right 01/21/2013   Procedure: SHOULDER HEMI ARTHROPLASTY;  Surgeon: Garald Balding, MD;  Location: [place];  Service: Orthopedics;  Laterality: Right;   TRANSTHORACIC ECHOCARDIOGRAM  01/2011   EF 55-60%, mild asymmetric LVH, elevated RV systolic pressure   VASECTOMY      FAMILY HISTORY:  Family History  Problem Relation Age of Onset   CAD Mother        MI in her 56s   Skin cancer Mother         multiple skin cancers removed from face and head   Cancer Father        colon and pancreas   Epilepsy Son    Cancer Maternal Grandmother 58       "stomach"   Cancer Maternal Grandfather        prostate; died at the age of 35   Cancer Cousin        paternal first cousin kidney dx under 7   Breast cancer Cousin        maternal first cousin with breast cancer over 22    SOCIAL HISTORY:  Social History   Socioeconomic History   Marital status: Married    Spouse name: Not on file   Number of children: 4   Years of education: Not on file   Highest education level: Not on file  Occupational History   Occupation: retired  Scientist, product/process development strain: Not on file   Food insecurity    Worry: Not on file    Inability: Not on Lexicographer needs    Medical: Not on file    Non-medical: Not on file  Tobacco Use   Smoking status: Never Smoker   Smokeless tobacco: Never Used   Tobacco comment: occ alcohol  Substance and Sexual Activity   Alcohol use: Yes    Alcohol/week: 0.0 standard drinks    Comment: 2-3 drinks occasionally   Drug use: No   Sexual activity: Yes  Lifestyle   Physical activity    Days per week: Not on file    Minutes per session: Not on file   Stress: Not on file  Relationships   Social connections    Talks on phone: Not on file    Gets together: Not on file    Attends religious service: Not on file    Active member of club or organization: Not on file    Attends meetings of clubs or organizations: Not on file    Relationship status: Not on file   Intimate partner violence    Fear of current or ex partner: Not on file    Emotionally abused: Not on file    Physically abused: Not on file    Forced sexual activity: Not on file  Other Topics Concern   Not on file  Social History Narrative   Travis Daniels lives with his wife in [place]. He is recently retired and relocated to [facility] from [place]. Historically, his medical  provider has been [facility] in [place].    ALLERGIES: Patient has no known allergies.  MEDICATIONS:  Current Outpatient Medications  Medication Sig Dispense Refill   albuterol (PROVENTIL HFA;VENTOLIN HFA) 108 (90 BASE) MCG/ACT inhaler Inhale 2 puffs into the lungs every 6 (six) hours as needed for wheezing or shortness of breath.  allopurinol (ZYLOPRIM) 300 MG tablet TAKE 1 TABLET BY MOUTH EVERY DAY 30 tablet 6   aspirin EC 81 MG tablet Take 81 mg by mouth daily.     azelastine (OPTIVAR) 0.05 % ophthalmic solution Place 1 drop into both eyes 2 (two) times daily. 6 mL 12   chlorthalidone (HYGROTON) 25 MG tablet Take 25 mg by mouth daily.     diltiazem (CARDIZEM CD) 300 MG 24 hr capsule Take 1 capsule (300 mg total) by mouth daily. 90 capsule 3   GARLIC PO Take 1 capsule by mouth daily.     glucose blood (ONE TOUCH ULTRA TEST) test strip CHECK BLOOD SUGAR TWICE DAILY.   DX Code: E11.9 100 each 1   KRILL OIL PO Take 1 capsule by mouth daily.     losartan (COZAAR) 100 MG tablet Take 100 mg by mouth daily.     ONE TOUCH ULTRA TEST test strip CHECK BLOOD SUGAR TWICE DAILY 100 each 4   ONETOUCH DELICA LANCETS FINE MISC Check blood sugar daily 100 each 12   tiotropium (SPIRIVA) 18 MCG inhalation capsule Place 18 mcg into inhaler and inhale daily.     No current facility-administered medications for this encounter.     REVIEW OF SYSTEMS:  On review of systems, the patient reports that he is doing well overall. He denies any chest pain, shortness of breath, cough, fevers, chills, night sweats, unintended weight changes. He denies any bowel disturbances, and denies abdominal pain, nausea or vomiting. He denies any new musculoskeletal or joint aches or pains. His IPSS was 0, indicating no urinary symptoms. A complete review of systems is obtained and is otherwise negative.    PHYSICAL EXAM:  Wt Readings from Last 3 Encounters:  10/14/18 (!) 302 lb 12.8 oz (137.3 kg)  10/05/18 300  lb (136.1 kg)  09/30/18 (!) 300 lb 6.4 oz (136.3 kg)   Temp Readings from Last 3 Encounters:  09/30/18 98.4 F (36.9 C) (Oral)  03/01/18 98.1 F (36.7 C) (Oral)  11/22/17 98.4 F (36.9 C) (Oral)   BP Readings from Last 3 Encounters:  10/14/18 102/60  09/30/18 117/63  03/01/18 126/80   Pulse Readings from Last 3 Encounters:  10/14/18 64  09/30/18 (!) 54  09/06/18 75   Pain Assessment Pain Score: 0-No pain/10  Unable to assess due to telephone consult visit format.  KPS = 90  100 - Normal; no complaints; no evidence of disease. 90   - Able to carry on normal activity; minor signs or symptoms of disease. 80   - Normal activity with effort; some signs or symptoms of disease. 32   - Cares for self; unable to carry on normal activity or to do active work. 60   - Requires occasional assistance, but is able to care for most of his personal needs. 50   - Requires considerable assistance and frequent medical care. 65   - Disabled; requires special care and assistance. 63   - Severely disabled; hospital admission is indicated although death not imminent. 15   - Very sick; hospital admission necessary; active supportive treatment necessary. 10   - Moribund; fatal processes progressing rapidly. 0     - Dead  Karnofsky DA, Abelmann WH, Craver LS and Burchenal [facility] [phone]) The use of the nitrogen mustards in the palliative treatment of carcinoma: with particular reference to bronchogenic carcinoma Cancer 1 634-56  LABORATORY DATA:  Lab Results  Component Value Date   WBC 6.2 06/27/2017   HGB 15.0  06/27/2017   HCT 44.8 06/27/2017   MCV 87.6 06/27/2017   PLT 203.0 06/27/2017   Lab Results  Component Value Date   NA 140 09/30/2018   K 3.8 09/30/2018   CL 99 09/30/2018   CO2 32 09/30/2018   Lab Results  Component Value Date   ALT 19 09/30/2018   AST 20 09/30/2018   ALKPHOS 87 09/30/2018   BILITOT 1.1 09/30/2018     RADIOGRAPHY: No results found.    IMPRESSION/PLAN: This  visit was conducted via Telephone to spare the patient unnecessary potential exposure in the healthcare setting during the current COVID-19 pandemic.  1. 73 y.o. gentleman with Stage T1c adenocarcinoma of the prostate with Gleason Score of 3+4, and PSA of 10.3. We discussed the patient's workup and outlined the nature of prostate cancer in this setting. The patient's T stage, Gleason's score, and PSA put him into the favorable intermediate risk group. Accordingly, he is eligible for a variety of potential treatment options including brachytherapy or 5.5 weeks of external radiation with or without ADT, or prostatectomy. We discussed the available radiation techniques, and focused on the details and logistics of delivery. The patient is not an ideal candidate for brachytherapy with a prostate volume of 70. gm. We discussed and outlined the risks, benefits, short and long-term effects associated with radiotherapy and compared and contrasted these with prostatectomy. We discussed the role of SpaceOAR gel in reducing the rectal toxicity associated with radiotherapy. We also detailed the role of ADT in the treatment of intermediate risk prostate cancer and outlined the associated side effects that could be expected with this therapy.  He was encouraged to ask questions that were answered to his stated satisfaction.  He appears to have a good understanding of his disease and our treatment recommendations which are of curative intent.  At the end of the conversation the patient is interested in moving forward with 5.5 weeks of external beam therapy in combination with short-term ADT. We will share our discussion with Dr. Tresa Moore and make arrangements for start of ADT, now, in anticipation of beginning his radiotherapy approximately 2 months thereafter.  We will also help to coordinate a follow-up visit for fiducial markers and SpaceOAR gel placement to reduce rectal toxicity from radiotherapy, prior to CT simulation.  We  enjoyed meeting him and his wife today and look forward to continuing to participate in the care of this very nice gentleman.  Given current concerns for patient exposure during the COVID-19 pandemic, this encounter was conducted via telephone. We unfortunately had issues with MyChart on our end and had to switch the encounter to telephone. The patient has given verbal consent for this type of encounter. The time spent during this encounter was 35 minutes. The attendants for this meeting include Tyler Pita MD, Ashlyn Bruning PA-C, [place], patient, Travis Daniels and his wife. During the encounter, Tyler Pita MD, Ashlyn Bruning PA-C, and scribe, Wilburn Mylar were located at [facility].  Patient, Travis Daniels and his wife were located at home.    Nicholos Johns, PA-C    Tyler Pita, MD  [facility] Direct Dial: [phone]   Fax: [phone] [email or web address]   Skype   [email or web address]  This document serves as a record of services personally performed by Tyler Pita, MD and Freeman Caldron, PA-C. It was created on their behalf by Wilburn Mylar, a trained medical scribe. The creation of this record is  based on the scribe's personal observations and the provider's statements to them. This document has been checked and approved by the attending provider.

## 2019-01-14 NOTE — Progress Notes (Signed)
Spoke with patient via phone. Denies any issues or complaints of at this time. States he is unable to use my chart video. IPSS 0

## 2019-01-15 ENCOUNTER — Ambulatory Visit: Payer: Medicare Other

## 2019-01-15 ENCOUNTER — Telehealth: Payer: Self-pay | Admitting: *Deleted

## 2019-01-15 NOTE — Telephone Encounter (Signed)
CALLED PATIENT TO INFORM OF ADT APPT. WITH DR. Kinta ON 02-04-19 - ARRIVAL TIME - 9:15 AM, SPOKE WITH PATIENT'S WIFE - Travis Daniels AND SHE IS AWARE OF THIS APPT.

## 2019-02-04 ENCOUNTER — Encounter: Payer: Self-pay | Admitting: Medical Oncology

## 2019-02-04 DIAGNOSIS — R3129 Other microscopic hematuria: Secondary | ICD-10-CM | POA: Diagnosis not present

## 2019-02-04 DIAGNOSIS — N281 Cyst of kidney, acquired: Secondary | ICD-10-CM | POA: Diagnosis not present

## 2019-02-04 DIAGNOSIS — C61 Malignant neoplasm of prostate: Secondary | ICD-10-CM | POA: Diagnosis not present

## 2019-02-12 ENCOUNTER — Other Ambulatory Visit: Payer: Self-pay | Admitting: Urology

## 2019-02-12 DIAGNOSIS — C61 Malignant neoplasm of prostate: Secondary | ICD-10-CM

## 2019-02-18 DIAGNOSIS — C61 Malignant neoplasm of prostate: Secondary | ICD-10-CM | POA: Diagnosis not present

## 2019-02-20 ENCOUNTER — Telehealth: Payer: Self-pay | Admitting: *Deleted

## 2019-02-20 ENCOUNTER — Encounter: Payer: Self-pay | Admitting: *Deleted

## 2019-02-20 ENCOUNTER — Other Ambulatory Visit: Payer: Self-pay | Admitting: Urology

## 2019-02-20 MED ORDER — LORAZEPAM 1 MG PO TABS: 1.0000 mg | ORAL_TABLET | ORAL | 0 refills | Status: AC | PRN

## 2019-02-20 NOTE — Telephone Encounter (Signed)
CALLED PATIENT TO REMIND OF SIM APPT. AND MRI APPT. FOR 02-21-19, SPOKE WITH PATIENT AND HE IS AWARE OF THESE APPTS.

## 2019-02-20 NOTE — Telephone Encounter (Signed)
Called patient to inform that script is ready for pick-up @ his pharmacy, spoke with patient and he is aware of this

## 2019-02-21 ENCOUNTER — Telehealth: Payer: Self-pay | Admitting: *Deleted

## 2019-02-21 ENCOUNTER — Ambulatory Visit (HOSPITAL_COMMUNITY): Payer: Medicare Other

## 2019-02-21 ENCOUNTER — Ambulatory Visit: Admission: RE | Admit: 2019-02-21 | Payer: Medicare Other | Source: Ambulatory Visit | Admitting: Radiation Oncology

## 2019-02-21 ENCOUNTER — Other Ambulatory Visit: Payer: Self-pay

## 2019-02-21 NOTE — Telephone Encounter (Signed)
Called patient to inform of sim being moved to 03-25-19 @ 11 am and his MRI will be on 03-25-19 - arrival time- 1:30 pm @ [facility], spoke with patient's wife - Vaughan Basta and she is aware of these appts.

## 2019-03-24 ENCOUNTER — Telehealth: Payer: Self-pay | Admitting: *Deleted

## 2019-03-24 NOTE — Telephone Encounter (Signed)
CALLED PATIENT TO REMIND OF SIM AND MRI APPT. FOR 03-25-19, SPOKE WITH PATIENT'S WIFE LINDA AND SHE IS AWARE OF THESE APPTS.

## 2019-03-25 ENCOUNTER — Other Ambulatory Visit: Payer: Self-pay

## 2019-03-25 ENCOUNTER — Ambulatory Visit (HOSPITAL_COMMUNITY): Payer: Medicare Other

## 2019-03-25 ENCOUNTER — Ambulatory Visit
Admission: RE | Admit: 2019-03-25 | Discharge: 2019-03-25 | Disposition: A | Discharge status: Home / Self Care | Payer: Medicare Other | Source: Ambulatory Visit | Attending: Radiation Oncology | Admitting: Radiation Oncology

## 2019-03-25 ENCOUNTER — Encounter: Payer: Self-pay | Admitting: Medical Oncology

## 2019-03-25 ENCOUNTER — Ambulatory Visit (HOSPITAL_COMMUNITY)
Admission: RE | Admit: 2019-03-25 | Discharge: 2019-03-25 | Disposition: A | Discharge status: Home / Self Care | Payer: Medicare Other | Source: Ambulatory Visit | Attending: Urology | Admitting: Urology

## 2019-03-25 DIAGNOSIS — C61 Malignant neoplasm of prostate: Secondary | ICD-10-CM | POA: Insufficient documentation

## 2019-03-25 DIAGNOSIS — Z510 Encounter for antineoplastic radiation therapy: Secondary | ICD-10-CM | POA: Diagnosis not present

## 2019-03-25 NOTE — Progress Notes (Signed)
I met Travis Daniels and his wife 10/16/2016 when he consulted with Dr. Tammi Klippel. He re consulted today to discuss treatment after biopsy 12/31/18, resulted Gleason 7 cancer. He has chosen ST-ADT with 5 1/2 weeks radiation.

## 2019-03-25 NOTE — Progress Notes (Signed)
  Radiation Oncology         [phone] ________________________________  Name: Travis Daniels MRN: [id number]  Date: 03/25/2019  DOB: August 20, 1945  SIMULATION AND TREATMENT PLANNING NOTE    ICD-10-CM   1. Malignant neoplasm of prostate ([place])  C61     DIAGNOSIS:  74 y.o. gentleman with Stage T1c adenocarcinoma of the prostate with Gleason score of 3+4, and PSA of 10.3.  NARRATIVE:  The patient was brought to the [employer].  Identity was confirmed.  All relevant records and images related to the planned course of therapy were reviewed.  The patient freely provided informed written consent to proceed with treatment after reviewing the details related to the planned course of therapy. The consent form was witnessed and verified by the simulation staff.  Then, the patient was set-up in a stable reproducible supine position for radiation therapy.  A vacuum lock pillow device was custom fabricated to position his legs in a reproducible immobilized position.  Then, I performed a urethrogram under sterile conditions to identify the prostatic apex.  CT images were obtained.  Surface markings were placed.  The CT images were loaded into the planning software.  Then the prostate target and avoidance structures including the rectum, bladder, bowel and hips were contoured.  Treatment planning then occurred.  The radiation prescription was entered and confirmed.  A total of one complex treatment devices was fabricated. I have requested : Intensity Modulated Radiotherapy (IMRT) is medically necessary for this case for the following reason:  Rectal sparing.Marland Kitchen  PLAN:  The patient will receive 70 Gy in 28 fractions.  ________________________________  Sheral Apley Tammi Klippel, M.D.

## 2019-03-28 DIAGNOSIS — Z510 Encounter for antineoplastic radiation therapy: Secondary | ICD-10-CM | POA: Diagnosis not present

## 2019-03-28 DIAGNOSIS — C61 Malignant neoplasm of prostate: Secondary | ICD-10-CM | POA: Diagnosis not present

## 2019-04-03 ENCOUNTER — Other Ambulatory Visit: Payer: Self-pay

## 2019-04-03 ENCOUNTER — Ambulatory Visit: Payer: Medicare Other | Admitting: Family Medicine

## 2019-04-03 ENCOUNTER — Ambulatory Visit
Admission: RE | Admit: 2019-04-03 | Discharge: 2019-04-03 | Disposition: A | Discharge status: Home / Self Care | Payer: Medicare Other | Source: Ambulatory Visit | Attending: Radiation Oncology | Admitting: Radiation Oncology

## 2019-04-03 DIAGNOSIS — C61 Malignant neoplasm of prostate: Secondary | ICD-10-CM | POA: Diagnosis not present

## 2019-04-03 DIAGNOSIS — Z510 Encounter for antineoplastic radiation therapy: Secondary | ICD-10-CM | POA: Diagnosis not present

## 2019-04-04 ENCOUNTER — Ambulatory Visit
Admission: RE | Admit: 2019-04-04 | Discharge: 2019-04-04 | Disposition: A | Discharge status: Home / Self Care | Payer: Medicare Other | Source: Ambulatory Visit | Attending: Radiation Oncology | Admitting: Radiation Oncology

## 2019-04-04 ENCOUNTER — Ambulatory Visit (INDEPENDENT_AMBULATORY_CARE_PROVIDER_SITE_OTHER): Payer: Medicare Other | Admitting: Family Medicine

## 2019-04-04 ENCOUNTER — Other Ambulatory Visit: Payer: Self-pay

## 2019-04-04 DIAGNOSIS — J9600 Acute respiratory failure, unspecified whether with hypoxia or hypercapnia: Secondary | ICD-10-CM

## 2019-04-04 DIAGNOSIS — E1169 Type 2 diabetes mellitus with other specified complication: Secondary | ICD-10-CM | POA: Diagnosis not present

## 2019-04-04 DIAGNOSIS — N183 Chronic kidney disease, stage 3 unspecified: Secondary | ICD-10-CM

## 2019-04-04 DIAGNOSIS — I10 Essential (primary) hypertension: Secondary | ICD-10-CM

## 2019-04-04 DIAGNOSIS — N179 Acute kidney failure, unspecified: Secondary | ICD-10-CM

## 2019-04-04 DIAGNOSIS — E1151 Type 2 diabetes mellitus with diabetic peripheral angiopathy without gangrene: Secondary | ICD-10-CM

## 2019-04-04 DIAGNOSIS — J449 Chronic obstructive pulmonary disease, unspecified: Secondary | ICD-10-CM

## 2019-04-04 DIAGNOSIS — M1A0790 Idiopathic chronic gout, unspecified ankle and foot, without tophus (tophi): Secondary | ICD-10-CM | POA: Diagnosis not present

## 2019-04-04 DIAGNOSIS — E119 Type 2 diabetes mellitus without complications: Secondary | ICD-10-CM

## 2019-04-04 DIAGNOSIS — C61 Malignant neoplasm of prostate: Secondary | ICD-10-CM | POA: Diagnosis not present

## 2019-04-04 DIAGNOSIS — Z510 Encounter for antineoplastic radiation therapy: Secondary | ICD-10-CM | POA: Diagnosis not present

## 2019-04-04 DIAGNOSIS — E785 Hyperlipidemia, unspecified: Secondary | ICD-10-CM

## 2019-04-04 MED ORDER — ALLOPURINOL 300 MG PO TABS: 300.0000 mg | ORAL_TABLET | Freq: Every day | ORAL | 6 refills | Status: DC

## 2019-04-04 NOTE — Progress Notes (Signed)
Virtual Visit via Telephone Note  I connected with Travis Daniels on 04/04/19 at  3:40 PM EST by telephone and verified that I am speaking with the correct person using two identifiers.  Location: Patient: home with wife  Provider: office   I discussed the limitations, risks, security and privacy concerns of performing an evaluation and management service by telephone and the availability of in person appointments. I also discussed with the patient that there may be a patient responsible charge related to this service. The patient expressed understanding and agreed to proceed.   History of Present Illness: Pt is home with his wife --- he has had his second radiation treatment for prostate CA  He needs f/u dm , htn, hyperlipidemia  No new complaints   HYPERTENSION   Blood pressure range-not checking   Chest pain- no      Dyspnea- no Lightheadedness- no   Edema- no  Other side effects - no   Medication compliance: good Low salt diet- yes    DIABETES    Blood Sugar ranges-good per pt  Polyuria- no New Visual problems- no  Hypoglycemic symptoms- no  Other side effects-no Medication compliance - good Last eye exam- 4 weeks ago 03/03/2020   HYPERLIPIDEMIA  Medication compliance- good RUQ pain- no  Muscle aches- no Other side effects-no    Observations/Objective: There were no vitals filed for this visit. Vs stable during radiation for prostate ca   Assessment and Plan: 1. Idiopathic chronic gout of foot without tophus, unspecified laterality Stable Refill meds  - allopurinol (ZYLOPRIM) 300 MG tablet; Take 1 tablet (300 mg total) by mouth daily.  Dispense: 30 tablet; Refill: 6  2. Chronic obstructive pulmonary disease, unspecified COPD type ([place]) [facility] and [facility]  3. Hyperlipidemia associated with type 2 diabetes mellitus ([place]) Encouraged heart healthy diet, increase exercise, avoid trans fats, consider a krill oil cap daily  4. DM (diabetes mellitus) type II,  controlled, with peripheral vascular disorder ([place]) Get labs from [place] con't meds   5. Acute respiratory failure, unspecified whether with hypoxia or hypercapnia ([place]) Per pulmonary   6. Severe obesity (BMI >= 40) ([facility])   7. Essential hypertension, benign Well controlled, no changes to meds. Encouraged heart healthy diet such as the DASH diet and exercise as tolerated.   8. Type 2 diabetes mellitus without complication, without long-term current use of insulin ([place])   9. Acute renal failure superimposed on stage 3 chronic kidney disease, unspecified acute renal failure type, unspecified whether stage 3a or 3b CKD ([place]) Per nephrology  10. Hyperlipidemia, unspecified hyperlipidemia type Encouraged heart healthy diet, increase exercise, avoid trans fats, consider a krill oil cap daily   Follow Up Instructions:    I discussed the assessment and treatment plan with the patient. The patient was provided an opportunity to ask questions and all were answered. The patient agreed with the plan and demonstrated an understanding of the instructions.   The patient was advised to call back or seek an in-person evaluation if the symptoms worsen or if the condition fails to improve as anticipated.  I provided 20 minutes of non-face-to-face time during this encounter.   Ann Held, DO

## 2019-04-06 ENCOUNTER — Encounter: Payer: Self-pay | Admitting: Family Medicine

## 2019-04-06 DIAGNOSIS — E1151 Type 2 diabetes mellitus with diabetic peripheral angiopathy without gangrene: Secondary | ICD-10-CM | POA: Insufficient documentation

## 2019-04-06 NOTE — Assessment & Plan Note (Signed)
[facility] per [facility] Pt sill have them sent to [place]

## 2019-04-06 NOTE — Assessment & Plan Note (Signed)
Labs done by [facility] Pt will get the labs for [place]

## 2019-04-06 NOTE — Assessment & Plan Note (Signed)
Tolerating statin, encouraged heart healthy diet, avoid trans fats, minimize simple carbs and saturated fats. Increase exercise as tolerated 

## 2019-04-06 NOTE — Assessment & Plan Note (Signed)
Well controlled, no changes to meds. Encouraged heart healthy diet such as the DASH diet and exercise as tolerated.  °

## 2019-04-06 NOTE — Assessment & Plan Note (Signed)
Per pulm 

## 2019-04-07 ENCOUNTER — Ambulatory Visit
Admission: RE | Admit: 2019-04-07 | Discharge: 2019-04-07 | Disposition: A | Discharge status: Home / Self Care | Payer: Medicare Other | Source: Ambulatory Visit | Attending: Radiation Oncology | Admitting: Radiation Oncology

## 2019-04-07 ENCOUNTER — Other Ambulatory Visit: Payer: Self-pay

## 2019-04-07 DIAGNOSIS — Z510 Encounter for antineoplastic radiation therapy: Secondary | ICD-10-CM | POA: Diagnosis not present

## 2019-04-07 DIAGNOSIS — C61 Malignant neoplasm of prostate: Secondary | ICD-10-CM | POA: Diagnosis not present

## 2019-04-08 ENCOUNTER — Other Ambulatory Visit: Payer: Self-pay

## 2019-04-08 ENCOUNTER — Ambulatory Visit
Admission: RE | Admit: 2019-04-08 | Discharge: 2019-04-08 | Disposition: A | Discharge status: Home / Self Care | Payer: Medicare Other | Source: Ambulatory Visit | Attending: Radiation Oncology | Admitting: Radiation Oncology

## 2019-04-08 DIAGNOSIS — C61 Malignant neoplasm of prostate: Secondary | ICD-10-CM | POA: Diagnosis not present

## 2019-04-08 DIAGNOSIS — Z510 Encounter for antineoplastic radiation therapy: Secondary | ICD-10-CM | POA: Diagnosis not present

## 2019-04-09 ENCOUNTER — Ambulatory Visit
Admission: RE | Admit: 2019-04-09 | Discharge: 2019-04-09 | Disposition: A | Discharge status: Home / Self Care | Payer: Medicare Other | Source: Ambulatory Visit | Attending: Radiation Oncology | Admitting: Radiation Oncology

## 2019-04-09 ENCOUNTER — Telehealth: Payer: Self-pay | Admitting: Family Medicine

## 2019-04-09 ENCOUNTER — Other Ambulatory Visit: Payer: Self-pay

## 2019-04-09 DIAGNOSIS — Z510 Encounter for antineoplastic radiation therapy: Secondary | ICD-10-CM | POA: Diagnosis not present

## 2019-04-09 DIAGNOSIS — C61 Malignant neoplasm of prostate: Secondary | ICD-10-CM | POA: Diagnosis not present

## 2019-04-09 NOTE — Telephone Encounter (Signed)
Pt dropped off documents for provider to have on pt's chart. Lab results 4 pages. Document put at front office tray under providers name.

## 2019-04-10 ENCOUNTER — Other Ambulatory Visit: Payer: Self-pay

## 2019-04-10 ENCOUNTER — Ambulatory Visit
Admission: RE | Admit: 2019-04-10 | Discharge: 2019-04-10 | Disposition: A | Discharge status: Home / Self Care | Payer: Medicare Other | Source: Ambulatory Visit | Attending: Radiation Oncology | Admitting: Radiation Oncology

## 2019-04-10 DIAGNOSIS — C61 Malignant neoplasm of prostate: Secondary | ICD-10-CM | POA: Diagnosis not present

## 2019-04-10 DIAGNOSIS — Z510 Encounter for antineoplastic radiation therapy: Secondary | ICD-10-CM | POA: Diagnosis not present

## 2019-04-10 NOTE — Telephone Encounter (Signed)
Received. Notes will be given to [facility]

## 2019-04-11 ENCOUNTER — Other Ambulatory Visit: Payer: Self-pay

## 2019-04-11 ENCOUNTER — Ambulatory Visit
Admission: RE | Admit: 2019-04-11 | Discharge: 2019-04-11 | Disposition: A | Discharge status: Home / Self Care | Payer: Medicare Other | Source: Ambulatory Visit | Attending: Radiation Oncology | Admitting: Radiation Oncology

## 2019-04-11 DIAGNOSIS — C61 Malignant neoplasm of prostate: Secondary | ICD-10-CM | POA: Diagnosis not present

## 2019-04-11 DIAGNOSIS — Z510 Encounter for antineoplastic radiation therapy: Secondary | ICD-10-CM | POA: Diagnosis not present

## 2019-04-14 ENCOUNTER — Ambulatory Visit
Admission: RE | Admit: 2019-04-14 | Discharge: 2019-04-14 | Disposition: A | Discharge status: Home / Self Care | Payer: Medicare Other | Source: Ambulatory Visit | Attending: Radiation Oncology | Admitting: Radiation Oncology

## 2019-04-14 ENCOUNTER — Other Ambulatory Visit: Payer: Self-pay

## 2019-04-14 DIAGNOSIS — C61 Malignant neoplasm of prostate: Secondary | ICD-10-CM | POA: Insufficient documentation

## 2019-04-14 DIAGNOSIS — Z510 Encounter for antineoplastic radiation therapy: Secondary | ICD-10-CM | POA: Insufficient documentation

## 2019-04-15 ENCOUNTER — Other Ambulatory Visit: Payer: Self-pay

## 2019-04-15 ENCOUNTER — Ambulatory Visit
Admission: RE | Admit: 2019-04-15 | Discharge: 2019-04-15 | Disposition: A | Discharge status: Home / Self Care | Payer: Medicare Other | Source: Ambulatory Visit | Attending: Radiation Oncology | Admitting: Radiation Oncology

## 2019-04-15 DIAGNOSIS — Z510 Encounter for antineoplastic radiation therapy: Secondary | ICD-10-CM | POA: Diagnosis not present

## 2019-04-15 DIAGNOSIS — C61 Malignant neoplasm of prostate: Secondary | ICD-10-CM | POA: Diagnosis not present

## 2019-04-16 ENCOUNTER — Ambulatory Visit
Admission: RE | Admit: 2019-04-16 | Discharge: 2019-04-16 | Disposition: A | Discharge status: Home / Self Care | Payer: Medicare Other | Source: Ambulatory Visit | Attending: Radiation Oncology | Admitting: Radiation Oncology

## 2019-04-16 ENCOUNTER — Other Ambulatory Visit: Payer: Self-pay

## 2019-04-16 DIAGNOSIS — C61 Malignant neoplasm of prostate: Secondary | ICD-10-CM | POA: Diagnosis not present

## 2019-04-16 DIAGNOSIS — Z510 Encounter for antineoplastic radiation therapy: Secondary | ICD-10-CM | POA: Diagnosis not present

## 2019-04-17 ENCOUNTER — Ambulatory Visit
Admission: RE | Admit: 2019-04-17 | Discharge: 2019-04-17 | Disposition: A | Discharge status: Home / Self Care | Payer: Medicare Other | Source: Ambulatory Visit | Attending: Radiation Oncology | Admitting: Radiation Oncology

## 2019-04-17 ENCOUNTER — Other Ambulatory Visit: Payer: Self-pay

## 2019-04-17 DIAGNOSIS — Z510 Encounter for antineoplastic radiation therapy: Secondary | ICD-10-CM | POA: Diagnosis not present

## 2019-04-17 DIAGNOSIS — C61 Malignant neoplasm of prostate: Secondary | ICD-10-CM | POA: Diagnosis not present

## 2019-04-18 ENCOUNTER — Other Ambulatory Visit: Payer: Self-pay

## 2019-04-18 ENCOUNTER — Ambulatory Visit
Admission: RE | Admit: 2019-04-18 | Discharge: 2019-04-18 | Disposition: A | Discharge status: Home / Self Care | Payer: Medicare Other | Source: Ambulatory Visit | Attending: Radiation Oncology | Admitting: Radiation Oncology

## 2019-04-18 DIAGNOSIS — C61 Malignant neoplasm of prostate: Secondary | ICD-10-CM | POA: Diagnosis not present

## 2019-04-18 DIAGNOSIS — Z510 Encounter for antineoplastic radiation therapy: Secondary | ICD-10-CM | POA: Diagnosis not present

## 2019-04-21 ENCOUNTER — Ambulatory Visit
Admission: RE | Admit: 2019-04-21 | Discharge: 2019-04-21 | Disposition: A | Discharge status: Home / Self Care | Payer: Medicare Other | Source: Ambulatory Visit | Attending: Radiation Oncology | Admitting: Radiation Oncology

## 2019-04-21 ENCOUNTER — Other Ambulatory Visit: Payer: Self-pay

## 2019-04-21 DIAGNOSIS — Z510 Encounter for antineoplastic radiation therapy: Secondary | ICD-10-CM | POA: Diagnosis not present

## 2019-04-21 DIAGNOSIS — C61 Malignant neoplasm of prostate: Secondary | ICD-10-CM | POA: Diagnosis not present

## 2019-04-22 ENCOUNTER — Other Ambulatory Visit: Payer: Self-pay

## 2019-04-22 ENCOUNTER — Ambulatory Visit
Admission: RE | Admit: 2019-04-22 | Discharge: 2019-04-22 | Disposition: A | Discharge status: Home / Self Care | Payer: Medicare Other | Source: Ambulatory Visit | Attending: Radiation Oncology | Admitting: Radiation Oncology

## 2019-04-22 DIAGNOSIS — Z510 Encounter for antineoplastic radiation therapy: Secondary | ICD-10-CM | POA: Diagnosis not present

## 2019-04-22 DIAGNOSIS — C61 Malignant neoplasm of prostate: Secondary | ICD-10-CM | POA: Diagnosis not present

## 2019-04-23 ENCOUNTER — Other Ambulatory Visit: Payer: Self-pay

## 2019-04-23 ENCOUNTER — Ambulatory Visit
Admission: RE | Admit: 2019-04-23 | Discharge: 2019-04-23 | Disposition: A | Discharge status: Home / Self Care | Payer: Medicare Other | Source: Ambulatory Visit | Attending: Radiation Oncology | Admitting: Radiation Oncology

## 2019-04-23 DIAGNOSIS — Z510 Encounter for antineoplastic radiation therapy: Secondary | ICD-10-CM | POA: Diagnosis not present

## 2019-04-23 DIAGNOSIS — C61 Malignant neoplasm of prostate: Secondary | ICD-10-CM | POA: Diagnosis not present

## 2019-04-24 ENCOUNTER — Ambulatory Visit: Payer: Medicare Other

## 2019-04-24 ENCOUNTER — Encounter: Payer: Self-pay | Admitting: Family Medicine

## 2019-04-24 ENCOUNTER — Other Ambulatory Visit: Payer: Self-pay

## 2019-04-24 DIAGNOSIS — C61 Malignant neoplasm of prostate: Secondary | ICD-10-CM | POA: Diagnosis not present

## 2019-04-24 DIAGNOSIS — Z510 Encounter for antineoplastic radiation therapy: Secondary | ICD-10-CM | POA: Diagnosis not present

## 2019-04-25 ENCOUNTER — Ambulatory Visit: Payer: Medicare Other

## 2019-04-25 ENCOUNTER — Other Ambulatory Visit: Payer: Self-pay

## 2019-04-25 DIAGNOSIS — Z510 Encounter for antineoplastic radiation therapy: Secondary | ICD-10-CM | POA: Diagnosis not present

## 2019-04-25 DIAGNOSIS — C61 Malignant neoplasm of prostate: Secondary | ICD-10-CM | POA: Diagnosis not present

## 2019-04-28 ENCOUNTER — Other Ambulatory Visit: Payer: Self-pay

## 2019-04-28 ENCOUNTER — Ambulatory Visit
Admission: RE | Admit: 2019-04-28 | Discharge: 2019-04-28 | Disposition: A | Discharge status: Home / Self Care | Payer: Medicare Other | Source: Ambulatory Visit | Attending: Radiation Oncology | Admitting: Radiation Oncology

## 2019-04-28 DIAGNOSIS — Z510 Encounter for antineoplastic radiation therapy: Secondary | ICD-10-CM | POA: Diagnosis not present

## 2019-04-28 DIAGNOSIS — C61 Malignant neoplasm of prostate: Secondary | ICD-10-CM | POA: Diagnosis not present

## 2019-04-29 ENCOUNTER — Ambulatory Visit
Admission: RE | Admit: 2019-04-29 | Discharge: 2019-04-29 | Disposition: A | Discharge status: Home / Self Care | Payer: Medicare Other | Source: Ambulatory Visit | Attending: Radiation Oncology | Admitting: Radiation Oncology

## 2019-04-29 ENCOUNTER — Other Ambulatory Visit: Payer: Self-pay

## 2019-04-29 DIAGNOSIS — Z510 Encounter for antineoplastic radiation therapy: Secondary | ICD-10-CM | POA: Diagnosis not present

## 2019-04-29 DIAGNOSIS — C61 Malignant neoplasm of prostate: Secondary | ICD-10-CM | POA: Diagnosis not present

## 2019-04-30 ENCOUNTER — Ambulatory Visit
Admission: RE | Admit: 2019-04-30 | Discharge: 2019-04-30 | Disposition: A | Discharge status: Home / Self Care | Payer: Medicare Other | Source: Ambulatory Visit | Attending: Radiation Oncology | Admitting: Radiation Oncology

## 2019-04-30 ENCOUNTER — Other Ambulatory Visit: Payer: Self-pay

## 2019-04-30 DIAGNOSIS — C61 Malignant neoplasm of prostate: Secondary | ICD-10-CM | POA: Diagnosis not present

## 2019-04-30 DIAGNOSIS — Z510 Encounter for antineoplastic radiation therapy: Secondary | ICD-10-CM | POA: Diagnosis not present

## 2019-05-01 ENCOUNTER — Ambulatory Visit: Payer: Medicare Other

## 2019-05-02 ENCOUNTER — Ambulatory Visit
Admission: RE | Admit: 2019-05-02 | Discharge: 2019-05-02 | Disposition: A | Discharge status: Home / Self Care | Payer: Medicare Other | Source: Ambulatory Visit | Attending: Radiation Oncology | Admitting: Radiation Oncology

## 2019-05-02 DIAGNOSIS — C61 Malignant neoplasm of prostate: Secondary | ICD-10-CM | POA: Diagnosis not present

## 2019-05-02 DIAGNOSIS — Z510 Encounter for antineoplastic radiation therapy: Secondary | ICD-10-CM | POA: Diagnosis not present

## 2019-05-05 ENCOUNTER — Ambulatory Visit
Admission: RE | Admit: 2019-05-05 | Discharge: 2019-05-05 | Disposition: A | Discharge status: Home / Self Care | Payer: Medicare Other | Source: Ambulatory Visit | Attending: Radiation Oncology | Admitting: Radiation Oncology

## 2019-05-05 ENCOUNTER — Other Ambulatory Visit: Payer: Self-pay

## 2019-05-05 DIAGNOSIS — Z510 Encounter for antineoplastic radiation therapy: Secondary | ICD-10-CM | POA: Diagnosis not present

## 2019-05-05 DIAGNOSIS — C61 Malignant neoplasm of prostate: Secondary | ICD-10-CM | POA: Diagnosis not present

## 2019-05-06 ENCOUNTER — Other Ambulatory Visit: Payer: Self-pay

## 2019-05-06 ENCOUNTER — Ambulatory Visit
Admission: RE | Admit: 2019-05-06 | Discharge: 2019-05-06 | Disposition: A | Discharge status: Home / Self Care | Payer: Medicare Other | Source: Ambulatory Visit | Attending: Radiation Oncology | Admitting: Radiation Oncology

## 2019-05-06 DIAGNOSIS — Z510 Encounter for antineoplastic radiation therapy: Secondary | ICD-10-CM | POA: Diagnosis not present

## 2019-05-06 DIAGNOSIS — C61 Malignant neoplasm of prostate: Secondary | ICD-10-CM | POA: Diagnosis not present

## 2019-05-07 ENCOUNTER — Ambulatory Visit
Admission: RE | Admit: 2019-05-07 | Discharge: 2019-05-07 | Disposition: A | Discharge status: Home / Self Care | Payer: Medicare Other | Source: Ambulatory Visit | Attending: Radiation Oncology | Admitting: Radiation Oncology

## 2019-05-07 ENCOUNTER — Other Ambulatory Visit: Payer: Self-pay

## 2019-05-07 DIAGNOSIS — C61 Malignant neoplasm of prostate: Secondary | ICD-10-CM | POA: Diagnosis not present

## 2019-05-07 DIAGNOSIS — Z510 Encounter for antineoplastic radiation therapy: Secondary | ICD-10-CM | POA: Diagnosis not present

## 2019-05-08 ENCOUNTER — Other Ambulatory Visit: Payer: Self-pay

## 2019-05-08 ENCOUNTER — Ambulatory Visit
Admission: RE | Admit: 2019-05-08 | Discharge: 2019-05-08 | Disposition: A | Discharge status: Home / Self Care | Payer: Medicare Other | Source: Ambulatory Visit | Attending: Radiation Oncology | Admitting: Radiation Oncology

## 2019-05-08 DIAGNOSIS — Z510 Encounter for antineoplastic radiation therapy: Secondary | ICD-10-CM | POA: Diagnosis not present

## 2019-05-08 DIAGNOSIS — C61 Malignant neoplasm of prostate: Secondary | ICD-10-CM | POA: Diagnosis not present

## 2019-05-09 ENCOUNTER — Ambulatory Visit
Admission: RE | Admit: 2019-05-09 | Discharge: 2019-05-09 | Disposition: A | Discharge status: Home / Self Care | Payer: Medicare Other | Source: Ambulatory Visit | Attending: Radiation Oncology | Admitting: Radiation Oncology

## 2019-05-09 ENCOUNTER — Other Ambulatory Visit: Payer: Self-pay

## 2019-05-09 DIAGNOSIS — Z510 Encounter for antineoplastic radiation therapy: Secondary | ICD-10-CM | POA: Diagnosis not present

## 2019-05-09 DIAGNOSIS — C61 Malignant neoplasm of prostate: Secondary | ICD-10-CM | POA: Diagnosis not present

## 2019-05-12 ENCOUNTER — Other Ambulatory Visit: Payer: Self-pay

## 2019-05-12 ENCOUNTER — Ambulatory Visit
Admission: RE | Admit: 2019-05-12 | Discharge: 2019-05-12 | Disposition: A | Discharge status: Home / Self Care | Payer: Medicare Other | Source: Ambulatory Visit | Attending: Radiation Oncology | Admitting: Radiation Oncology

## 2019-05-12 ENCOUNTER — Ambulatory Visit: Payer: Medicare Other

## 2019-05-12 DIAGNOSIS — Z510 Encounter for antineoplastic radiation therapy: Secondary | ICD-10-CM | POA: Diagnosis not present

## 2019-05-12 DIAGNOSIS — C61 Malignant neoplasm of prostate: Secondary | ICD-10-CM | POA: Diagnosis not present

## 2019-05-13 ENCOUNTER — Encounter: Payer: Self-pay | Admitting: Radiation Oncology

## 2019-05-13 ENCOUNTER — Ambulatory Visit
Admission: RE | Admit: 2019-05-13 | Discharge: 2019-05-13 | Disposition: A | Discharge status: Home / Self Care | Payer: Medicare Other | Source: Ambulatory Visit | Attending: Radiation Oncology | Admitting: Radiation Oncology

## 2019-05-13 ENCOUNTER — Other Ambulatory Visit: Payer: Self-pay

## 2019-05-13 DIAGNOSIS — Z510 Encounter for antineoplastic radiation therapy: Secondary | ICD-10-CM | POA: Diagnosis not present

## 2019-05-13 DIAGNOSIS — C61 Malignant neoplasm of prostate: Secondary | ICD-10-CM | POA: Diagnosis not present

## 2019-05-22 ENCOUNTER — Encounter: Payer: Self-pay | Admitting: Family Medicine

## 2019-05-27 DIAGNOSIS — C61 Malignant neoplasm of prostate: Secondary | ICD-10-CM | POA: Diagnosis not present

## 2019-05-27 NOTE — Progress Notes (Signed)
  Radiation Oncology         [phone]) [phone] ________________________________  Name: Travis Daniels MRN: [id number]  Date: 05/13/2019  DOB: 1945-12-25  End of Treatment Note  Diagnosis:    74 y.o. gentleman with Stage T1c adenocarcinoma of the prostate with Gleason score of 3+4, and PSA of 10.3     Indication for treatment:  Curative, Definitive Radiotherapy       Radiation treatment dates:   2/21-05/13/19  Site/dose:   The prostate was treated to 70 Gy in 28 fractions of 2.5 Gy  Beams/energy:   The patient was treated with IMRT using volumetric arc therapy delivering 6 MV X-rays to clockwise and counterclockwise circumferential arcs with a 90 degree collimator offset to avoid dose scalloping.  Image guidance was performed with daily cone beam CT prior to each fraction to align to gold markers in the prostate and assure proper bladder and rectal fill volumes.  Immobilization was achieved with BodyFix custom mold.  Narrative: The patient tolerated radiation treatment relatively well.   The patient experienced some minor urinary irritation and modest fatigue.    Plan: The patient has completed radiation treatment. He will return to radiation oncology clinic for routine followup in one month. I advised him to call or return sooner if he has any questions or concerns related to his recovery or treatment. ________________________________  Sheral Apley. Tammi Klippel, M.D.

## 2019-05-29 DIAGNOSIS — R809 Proteinuria, unspecified: Secondary | ICD-10-CM | POA: Diagnosis not present

## 2019-05-29 DIAGNOSIS — C61 Malignant neoplasm of prostate: Secondary | ICD-10-CM | POA: Diagnosis not present

## 2019-05-29 DIAGNOSIS — J449 Chronic obstructive pulmonary disease, unspecified: Secondary | ICD-10-CM | POA: Diagnosis not present

## 2019-05-29 DIAGNOSIS — N189 Chronic kidney disease, unspecified: Secondary | ICD-10-CM | POA: Diagnosis not present

## 2019-05-29 DIAGNOSIS — I129 Hypertensive chronic kidney disease with stage 1 through stage 4 chronic kidney disease, or unspecified chronic kidney disease: Secondary | ICD-10-CM | POA: Diagnosis not present

## 2019-05-29 DIAGNOSIS — M109 Gout, unspecified: Secondary | ICD-10-CM | POA: Diagnosis not present

## 2019-05-29 DIAGNOSIS — N183 Chronic kidney disease, stage 3 unspecified: Secondary | ICD-10-CM | POA: Diagnosis not present

## 2019-05-29 DIAGNOSIS — E1122 Type 2 diabetes mellitus with diabetic chronic kidney disease: Secondary | ICD-10-CM | POA: Diagnosis not present

## 2019-05-29 DIAGNOSIS — D631 Anemia in chronic kidney disease: Secondary | ICD-10-CM | POA: Diagnosis not present

## 2019-06-03 DIAGNOSIS — C61 Malignant neoplasm of prostate: Secondary | ICD-10-CM | POA: Diagnosis not present

## 2019-06-03 DIAGNOSIS — L2089 Other atopic dermatitis: Secondary | ICD-10-CM | POA: Diagnosis not present

## 2019-06-03 DIAGNOSIS — R3129 Other microscopic hematuria: Secondary | ICD-10-CM | POA: Diagnosis not present

## 2019-06-03 DIAGNOSIS — I872 Venous insufficiency (chronic) (peripheral): Secondary | ICD-10-CM | POA: Diagnosis not present

## 2019-06-03 DIAGNOSIS — N281 Cyst of kidney, acquired: Secondary | ICD-10-CM | POA: Diagnosis not present

## 2019-06-10 DIAGNOSIS — I872 Venous insufficiency (chronic) (peripheral): Secondary | ICD-10-CM | POA: Diagnosis not present

## 2019-06-17 ENCOUNTER — Encounter: Payer: Self-pay | Admitting: Urology

## 2019-06-17 ENCOUNTER — Telehealth: Payer: Self-pay

## 2019-06-17 ENCOUNTER — Other Ambulatory Visit: Payer: Self-pay

## 2019-06-17 NOTE — Telephone Encounter (Signed)
Appointment reminder for 04/20/19 which will be a telephone encounter patient verbalized he understood

## 2019-06-18 ENCOUNTER — Ambulatory Visit
Admission: RE | Admit: 2019-06-18 | Discharge: 2019-06-18 | Disposition: A | Discharge status: Home / Self Care | Payer: Medicare Other | Source: Ambulatory Visit | Attending: Radiation Oncology | Admitting: Radiation Oncology

## 2019-06-18 ENCOUNTER — Other Ambulatory Visit: Payer: Self-pay

## 2019-06-18 ENCOUNTER — Ambulatory Visit
Admission: RE | Admit: 2019-06-18 | Discharge: 2019-06-18 | Disposition: A | Discharge status: Home / Self Care | Payer: Medicare Other | Source: Ambulatory Visit | Attending: Urology | Admitting: Urology

## 2019-06-18 DIAGNOSIS — C61 Malignant neoplasm of prostate: Secondary | ICD-10-CM | POA: Insufficient documentation

## 2019-06-18 DIAGNOSIS — Z510 Encounter for antineoplastic radiation therapy: Secondary | ICD-10-CM | POA: Insufficient documentation

## 2019-06-18 NOTE — Progress Notes (Signed)
Radiation Oncology         [phone] ________________________________  Name: Travis Daniels MRN: [id number]  Date: 06/18/2019  DOB: 04-10-1945  Post Treatment Note  CC: Carollee Herter, [name], DO  Ann Held, *  Diagnosis:  74 y.o. gentleman with Stage T1c adenocarcinoma of the prostate with Gleason score of 3+4, and PSA of 10.3   Interval Since Last Radiation:  5 weeks  2/21-05/13/19:  The prostate was treated to 70 Gy in 28 fractions of 2.5 Gy  Narrative:  I spoke with the patient to conduct his routine scheduled 1 month follow up visit via telephone to spare the patient unnecessary potential exposure in the healthcare setting during the current COVID-19 pandemic.  The patient was notified in advance and gave permission to proceed with this visit format. Marland Kitchen He tolerated radiation treatment relatively well.   The patient experienced some minor urinary irritation and modest fatigue.                                On review of systems, the patient states that he is doing very well overall.  He continues with mild increased frequency and urgency but specifically denies dysuria, gross hematuria, straining to void, incomplete bladder emptying or incontinence.  He has continued to tolerate the ADT very well.  He had a follow-up visit with Dr. Tresa Moore on 06/03/2019 to review his first posttreatment PSA on 05/29/2019 which is decreased to 0.081.  Overall, he is quite pleased with his progress to date.  ALLERGIES:  has No Known Allergies.  Meds: Current Outpatient Medications  Medication Sig Dispense Refill  . albuterol (PROVENTIL HFA;VENTOLIN HFA) 108 (90 BASE) MCG/ACT inhaler Inhale 2 puffs into the lungs every 6 (six) hours as needed for wheezing or shortness of breath.    . allopurinol (ZYLOPRIM) 300 MG tablet Take 1 tablet (300 mg total) by mouth daily. 30 tablet 6  . aspirin EC 81 MG tablet Take 81 mg by mouth daily.    Marland Kitchen azelastine (OPTIVAR) 0.05 % ophthalmic solution Place 1 drop  into both eyes 2 (two) times daily. 6 mL 12  . chlorthalidone (HYGROTON) 25 MG tablet Take 25 mg by mouth daily.    Marland Kitchen diltiazem (CARDIZEM CD) 300 MG 24 hr capsule Take 1 capsule (300 mg total) by mouth daily. 90 capsule 3  . GARLIC PO Take 1 capsule by mouth daily.    Marland Kitchen glucose blood (ONE TOUCH ULTRA TEST) test strip CHECK BLOOD SUGAR TWICE DAILY.   DX Code: E11.9 100 each 1  . KRILL OIL PO Take 1 capsule by mouth daily.    Marland Kitchen LORazepam (ATIVAN) 1 MG tablet Take 1 tablet (1 mg total) by mouth as needed for anxiety (take one tablet 30 minutes prior to MRI and may repeat once, just prior to scan if needed.). 2 tablet 0  . losartan (COZAAR) 100 MG tablet Take 100 mg by mouth daily.    . ONE TOUCH ULTRA TEST test strip CHECK BLOOD SUGAR TWICE DAILY 100 each 4  . ONETOUCH DELICA LANCETS FINE MISC Check blood sugar daily 100 each 12  . tiotropium (SPIRIVA) 18 MCG inhalation capsule Place 18 mcg into inhaler and inhale daily.     No current facility-administered medications for this encounter.    Physical Findings:  vitals were not taken for this visit.   /Unable to assess due to telephone follow up visit format.  Lab Findings: Lab Results  Component Value Date   WBC 6.2 06/27/2017   HGB 15.0 06/27/2017   HCT 44.8 06/27/2017   MCV 87.6 06/27/2017   PLT 203.0 06/27/2017     Radiographic Findings: No results found.  Impression/Plan: 1. 74 y.o. gentleman with Stage T1c adenocarcinoma of the prostate with Gleason score of 3+4, and PSA of 10.3. He will continue to follow up with urology for ongoing PSA determinations and will follow up with Dr. Tresa Moore in September 2021. He understands what to expect with regards to PSA monitoring going forward. I will look forward to following his response to treatment via correspondence with urology, and would be happy to continue to participate in his care if clinically indicated. I talked to the patient about what to expect in the future, including his risk  for erectile dysfunction and rectal bleeding. I encouraged him to call or return to the office if he has any questions regarding his previous radiation or possible radiation side effects. He was comfortable with this plan and will follow up as needed.    Nicholos Johns, PA-C

## 2019-11-07 ENCOUNTER — Telehealth: Payer: Self-pay | Admitting: Gastroenterology

## 2019-11-07 NOTE — Telephone Encounter (Signed)
Hey Dr. Ardis Hughs, patient is being referred to [place] for a colonoscopy. The last one he had was at the [place] in 2016. I have the records. You are the DOD from 8/20, PM. Please review and advise. Thank you!

## 2019-11-11 NOTE — Telephone Encounter (Signed)
I reviewed a 15 page packet from the [facility] [place].  Included it was a gastroenterology consult from August 2021 which discussed his history of colon polyps.  July 2016 he had a colonoscopy with an excellent prep, complete examination.  3 polyps were removed and the biopsies from these proved hyperplastic polyps.  That gastroenterologist recommended he have a surveillance colonoscopy around now.   Please contact this patient and also the referring team at the [facility].  As always I find it very hard to tell who referred the patient officially but I think it was "Travis Daniels"   Let them know that [facility].  He does not have a family history of colon cancer.  His last colonoscopy found no precancerous polyps.  He does not need routine colon cancer screening again until July 2026 and at that point he will probably have "aged out" of routine screening.  As always, I am happy to discuss this with him in person if he prefers.

## 2019-11-25 DIAGNOSIS — C61 Malignant neoplasm of prostate: Secondary | ICD-10-CM | POA: Diagnosis not present

## 2019-11-26 NOTE — Telephone Encounter (Signed)
Pt called and was given Dr. Ardis Hughs' recommendations. Pt will discuss it with his wife and call us back in case he decides to sch ov to speak with Dr. Ardis Hughs.

## 2019-12-02 DIAGNOSIS — C61 Malignant neoplasm of prostate: Secondary | ICD-10-CM | POA: Diagnosis not present

## 2019-12-04 ENCOUNTER — Other Ambulatory Visit: Payer: Self-pay | Admitting: Family Medicine

## 2019-12-04 DIAGNOSIS — M1A0790 Idiopathic chronic gout, unspecified ankle and foot, without tophus (tophi): Secondary | ICD-10-CM

## 2019-12-11 ENCOUNTER — Ambulatory Visit: Payer: Self-pay | Admitting: *Deleted

## 2020-01-12 ENCOUNTER — Encounter: Payer: Self-pay | Admitting: Family Medicine

## 2020-01-27 ENCOUNTER — Encounter: Payer: Self-pay | Admitting: Gastroenterology

## 2020-01-27 ENCOUNTER — Ambulatory Visit (INDEPENDENT_AMBULATORY_CARE_PROVIDER_SITE_OTHER): Payer: No Typology Code available for payment source | Admitting: Gastroenterology

## 2020-01-27 DIAGNOSIS — Z86010 Personal history of colonic polyps: Secondary | ICD-10-CM | POA: Diagnosis not present

## 2020-01-27 MED ORDER — SUPREP BOWEL PREP KIT 17.5-3.13-1.6 GM/177ML PO SOLN: 1.0000 | ORAL | 0 refills | Status: DC

## 2020-01-27 NOTE — Progress Notes (Signed)
HPI: This is a very pleasant 74 year old man who was referred for "civilian colonoscopy" by the [facility] [place] system.  During the initial evaluation.  I reviewed a 72 page packet several months ago and it did not appear on reviewing just that information that he needed a colonoscopy now.  He presented here to discuss more details about his case.  It turns out he has had numerous high risk polyps over many years.  Also he has a family history of colon cancer with his father being diagnosed around the age of 41.  Currently he has no concerning GI symptoms.  No bleeding, no significant constipation diarrhea or abdominal pains.  He has probably gained 16 pounds since the beginning of the Covid pandemic.   Old Data Reviewed:  reviewed a 65 page packet from the [facility] [place].  Included it was a gastroenterology consult from August 2021 which discussed his history of colon polyps.  July 2016 he had a colonoscopy with an excellent prep, complete examination.  3 polyps were removed and the biopsies from these proved hyperplastic polyps.  That gastroenterologist recommended he have a surveillance colonoscopy around now.  I was able to view a colonoscopy report from October 2009 done at a different [facility] facility.  That colonoscopy was done for history of polyps.  They found and removed a single "small" descending colon adenoma.  There is documentation about a polyp stalk site and also a lipoma in the colon.  I was able to view a pathology report from December 2008 also from a [facility] [place] facility.  This looks like biopsies from the colonoscopy.  I am not able to see that colonoscopy report however.  There was adenomatous mucosa with high-grade glandular dysplasia.  I was able to view some pathology reports from October 2008 also from [facility] [place] facilities.  It looks like this would correspond with a colonoscopy done at the same time however I cannot see that report at all.  Multiple tubular adenomas one was tubulovillous with high-grade focal  dysplasia.  Also biopsies from the sigmoid showed "intramucosal adenocarcinoma"     Review of systems: Pertinent positive and negative review of systems were noted in the above HPI section. All other review negative.   Past Medical History:  Diagnosis Date  . Arthritis   . B12 deficiency   . COPD (chronic obstructive pulmonary disease) ([place]) 01/2011   FVC 47%, FEV1 49%   . 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 Family history of breast cancer   . Family history of colon cancer   . Family history of pancreatic cancer   . Family history of prostate cancer   . Family history of renal cancer   . Hyperlipidemia   . Hypertension   . Lower extremity edema   . Prostate cancer ([place])   . Secondary pulmonary hypertension 01/2011   RV syst pressure 30-40 mm Hg on ECHO     Past Surgical History:  Procedure Laterality Date  . 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 Ronelle Nigh at [facility] in [place], [place])  . ENDOVENOUS ABLATION SAPHENOUS VEIN W/ LASER Left 09/18/2016   endovenous laser ablation left greater saphenous vein by Tinnie Gens MD   . ENDOVENOUS ABLATION SAPHENOUS VEIN W/ LASER Left 10/09/2016   endovenous laser ablation left small saphenous vein by Tinnie Gens MD   . FINGER SURGERY Right 11   rt middle finger  .  PROSTATE BIOPSY    . TOTAL SHOULDER ARTHROPLASTY Right 01/21/2013   Procedure: SHOULDER HEMI ARTHROPLASTY;  Surgeon: Garald Balding, MD;  Location: [facility];  Service: Orthopedics;  Laterality: Right;  . TRANSTHORACIC ECHOCARDIOGRAM  01/2011   EF 55-60%, mild asymmetric LVH, elevated RV systolic pressure  . VASECTOMY      Current Outpatient Medications  Medication Sig Dispense Refill  . albuterol (PROVENTIL HFA;VENTOLIN HFA) 108 (90 BASE) MCG/ACT inhaler Inhale 2 puffs into the lungs every 6 (six) hours as needed for wheezing or shortness of breath.     . allopurinol (ZYLOPRIM) 300 MG tablet TAKE 1 TABLET BY MOUTH EVERY DAY 90 tablet 2  . aspirin EC 81 MG tablet Take 81 mg by mouth daily.    . chlorthalidone (HYGROTON) 25 MG tablet Take 25 mg by mouth daily.    Marland Kitchen diltiazem (CARDIZEM CD) 300 MG 24 hr capsule Take 1 capsule (300 mg total) by mouth daily. 90 capsule 3  . GARLIC PO Take 1 capsule by mouth daily.    Marland Kitchen KRILL OIL PO Take 1 capsule by mouth daily.    Marland Kitchen losartan (COZAAR) 100 MG tablet Take 100 mg by mouth daily.    Marland Kitchen tiotropium (SPIRIVA) 18 MCG inhalation capsule Place 18 mcg into inhaler and inhale daily.    Marland Kitchen LORazepam (ATIVAN) 1 MG tablet Take 1 tablet (1 mg total) by mouth as needed for anxiety (take one tablet 30 minutes prior to MRI and may repeat once, just prior to scan if needed.). (Patient not taking: Reported on 01/27/2020) 2 tablet 0  . ONE TOUCH ULTRA TEST test strip CHECK BLOOD SUGAR TWICE DAILY (Patient not taking: Reported on 01/27/2020) 100 each 4  . ONETOUCH DELICA LANCETS FINE MISC Check blood sugar daily (Patient not taking: Reported on 01/27/2020) 100 each 12   No current facility-administered medications for this visit.    Allergies as of 01/27/2020  . (No Known Allergies)    Family History  Problem Relation Age of Onset  . CAD Mother        MI in her 63s  . Skin cancer Mother        multiple skin cancers removed from face and head  . Cancer Father        colon and pancreas  . Colon cancer Father   . Pancreatic cancer Father   . Epilepsy Son   . Cancer Maternal Grandmother 42       "stomach"  . Cancer Maternal Grandfather        prostate; died at the age of 24  . Cancer Cousin        paternal first cousin kidney dx under 76  . Breast cancer Cousin        maternal first cousin with breast cancer over 31  . Esophageal cancer Neg Hx     Social History   Socioeconomic History  . Marital status: Married    Spouse name: Not on file  . Number of children: 4  . Years of education: Not on file   . Highest education level: Not on file  Occupational History  . Occupation: retired  Tobacco Use  . Smoking status: Never Smoker  . Smokeless tobacco: Never Used  . Tobacco comment: occ alcohol  Vaping Use  . Vaping Use: Never used  Substance and Sexual Activity  . Alcohol use: Yes    Alcohol/week: 0.0 standard drinks    Comment: 2-3 drinks occasionally  . Drug use: No  .  Sexual activity: Yes  Other Topics Concern  . Not on file  Social History Narrative   Mr. Jain lives with his wife in [place]. He is recently retired and relocated to [facility] from [place]. Historically, his medical provider has been [facility] in [place].   Social Determinants of Health   Financial Resource Strain:   . Difficulty of Paying Living Expenses: Not on file  Food Insecurity:   . Worried About Charity fundraiser in the Last Year: Not on file  . Ran Out of Food in the Last Year: Not on file  Transportation Needs:   . Lack of Transportation (Medical): Not on file  . Lack of Transportation (Non-Medical): Not on file  Physical Activity:   . Days of Exercise per Week: Not on file  . Minutes of Exercise per Session: Not on file  Stress:   . Feeling of Stress : Not on file  Social Connections:   . Frequency of Communication with Friends and Family: Not on file  . Frequency of Social Gatherings with Friends and Family: Not on file  . Attends Religious Services: Not on file  . Active Member of Clubs or Organizations: Not on file  . Attends Archivist Meetings: Not on file  . Marital Status: Not on file  Intimate Partner Violence:   . Fear of Current or Ex-Partner: Not on file  . Emotionally Abused: Not on file  . Physically Abused: Not on file  . Sexually Abused: Not on file     Physical Exam: Pulse 75   Ht 5\' 11"  (1.803 m)   Wt (!) 315 lb (142.9 kg)   SpO2 95%   BMI 43.93 kg/m  Constitutional: Obese, otherwise fairly well-appearing Psychiatric: alert and oriented x3 Eyes: extraocular  movements intact Mouth: oral pharynx moist, no lesions Neck: supple no lymphadenopathy Cardiovascular: heart regular rate and rhythm Lungs: clear to auscultation bilaterally Abdomen: soft, nontender, nondistended, no obvious ascites, no peritoneal signs, normal bowel sounds Extremities: no lower extremity edema bilaterally Skin: no lesions on visible extremities   Assessment and plan: 74 y.o. male with family history of colon cancer, personal history of advanced colon polyps  His last colonoscopy was about 5 years ago in the [place] system.  No adenomatous polyps were seen however he has a long history of advanced polyps.  I recommended we proceed with colonoscopy at his soonest convenience.  I see no reason for any further blood tests or imaging studies prior to then    Please see the "Patient Instructions" section for addition details about the plan.   Owens Loffler, MD [facility] 01/27/2020, 8:28 AM  Cc: Carollee Herter, [name], *  Total time on date of encounter was 45  minutes (this included time spent preparing to see the patient reviewing records; obtaining and/or reviewing separately obtained history; performing a medically appropriate exam and/or evaluation; counseling and educating the patient and family if present; ordering medications, tests or procedures if applicable; and documenting clinical information in the health record).

## 2020-01-27 NOTE — Patient Instructions (Signed)
If you are age 74 or older, your body mass index should be between 23-30. Your Body mass index is 43.93 kg/m. If this is out of the aforementioned range listed, please consider follow up with your Primary Care Provider.  If you are age 8 or younger, your body mass index should be between 19-25. Your Body mass index is 43.93 kg/m. If this is out of the aformentioned range listed, please consider follow up with your Primary Care Provider.   You have been scheduled for a colonoscopy. Please follow written instructions given to you at your visit today.  Please pick up your prep supplies at the pharmacy within the next 1-3 days. If you use inhalers (even only as needed), please bring them with you on the day of your procedure.  Due to recent changes in healthcare laws, you may see the results of your imaging and laboratory studies on MyChart before your provider has had a chance to review them.  We understand that in some cases there may be results that are confusing or concerning to you. Not all laboratory results come back in the same time frame and the provider may be waiting for multiple results in order to interpret others.  Please give [place] 48 hours in order for your provider to thoroughly review all the results before contacting the office for clarification of your results.   Thank you for entrusting me with your care and choosing [facility].  Dr Ardis Hughs

## 2020-02-09 ENCOUNTER — Ambulatory Visit (AMBULATORY_SURGERY_CENTER): Payer: No Typology Code available for payment source | Admitting: Gastroenterology

## 2020-02-09 ENCOUNTER — Encounter: Payer: Self-pay | Admitting: Gastroenterology

## 2020-02-09 ENCOUNTER — Other Ambulatory Visit: Payer: Self-pay

## 2020-02-09 VITALS — BP 109/55 | HR 52 | Temp 96.4°F | Resp 13 | Ht 71.0 in | Wt 315.0 lb

## 2020-02-09 DIAGNOSIS — D122 Benign neoplasm of ascending colon: Secondary | ICD-10-CM

## 2020-02-09 DIAGNOSIS — Z860100 Personal history of colon polyps, unspecified: Secondary | ICD-10-CM

## 2020-02-09 DIAGNOSIS — D120 Benign neoplasm of cecum: Secondary | ICD-10-CM

## 2020-02-09 DIAGNOSIS — D124 Benign neoplasm of descending colon: Secondary | ICD-10-CM

## 2020-02-09 DIAGNOSIS — D123 Benign neoplasm of transverse colon: Secondary | ICD-10-CM

## 2020-02-09 MED ORDER — SODIUM CHLORIDE 0.9 % IV SOLN: 500.0000 mL | Freq: Once | INTRAVENOUS | Status: AC

## 2020-02-09 NOTE — Progress Notes (Signed)
Called to room to assist during endoscopic procedure.  Patient ID and intended procedure confirmed with present staff. Received instructions for my participation in the procedure from the performing physician.  

## 2020-02-09 NOTE — Progress Notes (Signed)
VS by [place]  No changes to medical or social hx since previsit.

## 2020-02-09 NOTE — Patient Instructions (Signed)
12 polyps removed and sent to pathology.  Diverticulosis and hemorrhoids (internal and external) Resume previous medications.  Await pathology for final recommendations.  Handouts on findings given to patient.    YOU HAD AN ENDOSCOPIC PROCEDURE TODAY AT [facility]:   Refer to the procedure report that was given to you for any specific questions about what was found during the examination.  If the procedure report does not answer your questions, please call your gastroenterologist to clarify.  If you requested that your care partner not be given the details of your procedure findings, then the procedure report has been included in a sealed envelope for you to review at your convenience later.  YOU SHOULD EXPECT: Some feelings of bloating in the abdomen. Passage of more gas than usual.  Walking can help get rid of the air that was put into your GI tract during the procedure and reduce the bloating. If you had a lower endoscopy (such as a colonoscopy or flexible sigmoidoscopy) you may notice spotting of blood in your stool or on the toilet paper. If you underwent a bowel prep for your procedure, you may not have a normal bowel movement for a few days.  Please Note:  You might notice some irritation and congestion in your nose or some drainage.  This is from the oxygen used during your procedure.  There is no need for concern and it should clear up in a day or so.  SYMPTOMS TO REPORT IMMEDIATELY:   Following lower endoscopy (colonoscopy or flexible sigmoidoscopy):  Excessive amounts of blood in the stool  Significant tenderness or worsening of abdominal pains  Swelling of the abdomen that is new, acute  Fever of 100F or higher   For urgent or emergent issues, a gastroenterologist can be reached at any hour by calling [phone]. Do not use MyChart messaging for urgent concerns.    DIET:  We do recommend a small meal at first, but then you may proceed to your regular diet.   Drink plenty of fluids but you should avoid alcoholic beverages for 24 hours.  ACTIVITY:  You should plan to take it easy for the rest of today and you should NOT DRIVE or use heavy machinery until tomorrow (because of the sedation medicines used during the test).    FOLLOW UP: Our staff will call the number listed on your records 48-72 hours following your procedure to check on you and address any questions or concerns that you may have regarding the information given to you following your procedure. If we do not reach you, we will leave a message.  We will attempt to reach you two times.  During this call, we will ask if you have developed any symptoms of COVID 19. If you develop any symptoms (ie: fever, flu-like symptoms, shortness of breath, cough etc.) before then, please call [phone].  If you test positive for Covid 19 in the 2 weeks post procedure, please call and report this information to [place].    If any biopsies were taken you will be contacted by phone or by letter within the next 1-3 weeks.  Please call us at [phone] if you have not heard about the biopsies in 3 weeks.    SIGNATURES/CONFIDENTIALITY: You and/or your care partner have signed paperwork which will be entered into your electronic medical record.  These signatures attest to the fact that that the information above on your After Visit Summary has been reviewed and is understood.  Full responsibility of the confidentiality of this discharge information lies with you and/or your care-partner.

## 2020-02-09 NOTE — Op Note (Signed)
[place] Patient Name: Travis Daniels Procedure Date: 02/09/2020 1:10 PM MRN: [id number] Endoscopist: Milus Banister , MD Age: 74 Referring MD:  Date of Birth: 1945/09/30 Gender: Male Account #: [id number] Procedure:                Colonoscopy Indications:              High risk colon cancer surveillance: Personal                            history of colonic polyps; multiple advanced polyps                            over many years at several different [facility]                            facilities, most recent 5 years ago no polyps; also                            father had colon cancer in his 97s Medicines:                Monitored Anesthesia Care Procedure:                Pre-Anesthesia Assessment:                           - Prior to the procedure, a History and Physical                            was performed, and patient medications and                            allergies were reviewed. The patient's tolerance of                            previous anesthesia was also reviewed. The risks                            and benefits of the procedure and the sedation                            options and risks were discussed with the patient.                            All questions were answered, and informed consent                            was obtained. Prior Anticoagulants: The patient has                            taken no previous anticoagulant or antiplatelet                            agents. ASA Grade Assessment: III - A patient with  severe systemic disease. After reviewing the risks                            and benefits, the patient was deemed in                            satisfactory condition to undergo the procedure.                           After obtaining informed consent, the colonoscope                            was passed under direct vision. Throughout the                            procedure, the patient's blood  pressure, pulse, and                            oxygen saturations were monitored continuously. The                            Colonoscope was introduced through the anus and                            advanced to the the cecum, identified by                            appendiceal orifice and ileocecal valve. The                            colonoscopy was performed without difficulty. The                            patient tolerated the procedure well. The quality                            of the bowel preparation was good. The ileocecal                            valve, appendiceal orifice, and rectum were                            photographed. Scope In: 1:28:17 PM Scope Out: 1:52:47 PM Scope Withdrawal Time: 0 hours 15 minutes 17 seconds  Total Procedure Duration: 0 hours 24 minutes 30 seconds  Findings:                 Twelve sessile polyps were found in the descending                            colon, transverse colon, ascending colon and                            ileocecal valve. The polyps were 1 to 4 mm in size.  These polyps were removed with a cold snare.                            Resection and retrieval were complete.                           Multiple small and large-mouthed diverticula were                            found in the left colon.                           External and internal hemorrhoids were found. The                            hemorrhoids were small.                           The exam was otherwise without abnormality on                            direct and retroflexion views. Complications:            No immediate complications. Estimated blood loss:                            None                           . Estimated Blood Loss:     Estimated blood loss: none. Impression:               - Twelve 1 to 4 mm polyps in the descending colon,                            in the transverse colon, in the ascending colon and                             at the ileocecal valve, removed with a cold snare.                            Resected and retrieved.                           - Diverticulosis in the left colon.                           - External and internal hemorrhoids.                           - The examination was otherwise normal on direct                            and retroflexion views. Recommendation:           - Patient has a contact number available for  emergencies. The signs and symptoms of potential                            delayed complications were discussed with the                            patient. Return to normal activities tomorrow.                            Written discharge instructions were provided to the                            patient.                           - Resume previous diet.                           - Continue present medications.                           - Await pathology results. Milus Banister, MD 02/09/2020 1:56:10 PM This report has been signed electronically.

## 2020-02-09 NOTE — Progress Notes (Signed)
pt tolerated well. VSS. awake and to recovery. Report given to RN.  

## 2020-02-11 ENCOUNTER — Telehealth: Payer: Self-pay | Admitting: *Deleted

## 2020-02-11 NOTE — Telephone Encounter (Signed)
°  Follow up Call-  Call back number 02/09/2020  Post procedure Call Back phone  # [phone]  Permission to leave phone message Yes  Some recent data might be hidden     Patient questions:  Do you have a fever, pain , or abdominal swelling? No. Pain Score  0 *  Have you tolerated food without any problems? Yes.    Have you been able to return to your normal activities? Yes.    Do you have any questions about your discharge instructions: Diet   No. Medications  No. Follow up visit  No.  Do you have questions or concerns about your Care? No.  Actions: * If pain score is 4 or above: No action needed, pain <4.  1. Have you developed a fever since your procedure? no  2.   Have you had an respiratory symptoms (SOB or cough) since your procedure? no  3.   Have you tested positive for COVID 19 since your procedure no  4.   Have you had any family members/close contacts diagnosed with the COVID 19 since your procedure?  no   If yes to any of these questions please route to Joylene John, RN and Joella Prince, RN

## 2020-02-16 DIAGNOSIS — I872 Venous insufficiency (chronic) (peripheral): Secondary | ICD-10-CM | POA: Diagnosis not present

## 2020-02-16 DIAGNOSIS — L2089 Other atopic dermatitis: Secondary | ICD-10-CM | POA: Diagnosis not present

## 2020-02-16 DIAGNOSIS — L0390 Cellulitis, unspecified: Secondary | ICD-10-CM | POA: Diagnosis not present

## 2020-02-17 ENCOUNTER — Encounter: Payer: Self-pay | Admitting: Gastroenterology

## 2020-05-11 DIAGNOSIS — C61 Malignant neoplasm of prostate: Secondary | ICD-10-CM | POA: Diagnosis not present

## 2020-05-18 DIAGNOSIS — N281 Cyst of kidney, acquired: Secondary | ICD-10-CM | POA: Diagnosis not present

## 2020-05-18 DIAGNOSIS — C61 Malignant neoplasm of prostate: Secondary | ICD-10-CM | POA: Diagnosis not present

## 2020-07-15 DIAGNOSIS — N179 Acute kidney failure, unspecified: Secondary | ICD-10-CM | POA: Diagnosis not present

## 2020-07-15 DIAGNOSIS — E1122 Type 2 diabetes mellitus with diabetic chronic kidney disease: Secondary | ICD-10-CM | POA: Diagnosis not present

## 2020-07-15 DIAGNOSIS — R809 Proteinuria, unspecified: Secondary | ICD-10-CM | POA: Diagnosis not present

## 2020-07-15 DIAGNOSIS — E785 Hyperlipidemia, unspecified: Secondary | ICD-10-CM | POA: Diagnosis not present

## 2020-07-15 DIAGNOSIS — I129 Hypertensive chronic kidney disease with stage 1 through stage 4 chronic kidney disease, or unspecified chronic kidney disease: Secondary | ICD-10-CM | POA: Diagnosis not present

## 2020-07-15 DIAGNOSIS — N1831 Chronic kidney disease, stage 3a: Secondary | ICD-10-CM | POA: Diagnosis not present

## 2020-07-15 DIAGNOSIS — R3129 Other microscopic hematuria: Secondary | ICD-10-CM | POA: Diagnosis not present

## 2020-07-20 DIAGNOSIS — H524 Presbyopia: Secondary | ICD-10-CM | POA: Diagnosis not present

## 2020-07-20 DIAGNOSIS — H04123 Dry eye syndrome of bilateral lacrimal glands: Secondary | ICD-10-CM | POA: Diagnosis not present

## 2020-07-20 DIAGNOSIS — H40013 Open angle with borderline findings, low risk, bilateral: Secondary | ICD-10-CM | POA: Diagnosis not present

## 2020-07-20 DIAGNOSIS — E119 Type 2 diabetes mellitus without complications: Secondary | ICD-10-CM | POA: Diagnosis not present

## 2020-07-20 DIAGNOSIS — H52223 Regular astigmatism, bilateral: Secondary | ICD-10-CM | POA: Diagnosis not present

## 2020-07-20 DIAGNOSIS — H35033 Hypertensive retinopathy, bilateral: Secondary | ICD-10-CM | POA: Diagnosis not present

## 2020-07-20 DIAGNOSIS — H5203 Hypermetropia, bilateral: Secondary | ICD-10-CM | POA: Diagnosis not present

## 2020-07-20 DIAGNOSIS — H2513 Age-related nuclear cataract, bilateral: Secondary | ICD-10-CM | POA: Diagnosis not present

## 2020-07-20 LAB — HM DIABETES EYE EXAM

## 2020-08-16 DIAGNOSIS — L2089 Other atopic dermatitis: Secondary | ICD-10-CM | POA: Diagnosis not present

## 2020-08-16 DIAGNOSIS — I872 Venous insufficiency (chronic) (peripheral): Secondary | ICD-10-CM | POA: Diagnosis not present

## 2020-09-24 ENCOUNTER — Other Ambulatory Visit: Payer: Self-pay | Admitting: Family Medicine

## 2020-09-24 DIAGNOSIS — M1A0790 Idiopathic chronic gout, unspecified ankle and foot, without tophus (tophi): Secondary | ICD-10-CM

## 2020-10-08 ENCOUNTER — Other Ambulatory Visit: Payer: Self-pay | Admitting: Family Medicine

## 2020-10-08 DIAGNOSIS — M1A0790 Idiopathic chronic gout, unspecified ankle and foot, without tophus (tophi): Secondary | ICD-10-CM

## 2020-11-09 DIAGNOSIS — C61 Malignant neoplasm of prostate: Secondary | ICD-10-CM | POA: Diagnosis not present

## 2020-11-15 DIAGNOSIS — R3129 Other microscopic hematuria: Secondary | ICD-10-CM | POA: Diagnosis not present

## 2020-11-15 DIAGNOSIS — C61 Malignant neoplasm of prostate: Secondary | ICD-10-CM | POA: Diagnosis not present

## 2020-11-16 DIAGNOSIS — C61 Malignant neoplasm of prostate: Secondary | ICD-10-CM | POA: Diagnosis not present

## 2020-11-16 DIAGNOSIS — R3129 Other microscopic hematuria: Secondary | ICD-10-CM | POA: Diagnosis not present

## 2020-12-15 ENCOUNTER — Telehealth: Payer: Self-pay | Admitting: Family Medicine

## 2020-12-15 DIAGNOSIS — M1A0790 Idiopathic chronic gout, unspecified ankle and foot, without tophus (tophi): Secondary | ICD-10-CM

## 2020-12-15 MED ORDER — ALLOPURINOL 300 MG PO TABS: ORAL_TABLET | ORAL | 0 refills | Status: AC

## 2020-12-15 NOTE — Telephone Encounter (Signed)
Refill sent.

## 2020-12-15 NOTE — Telephone Encounter (Signed)
Medication:  allopurinol (ZYLOPRIM) 300 MG tablet [[id number]]    Has the patient contacted their pharmacy? No. (If no, request that the patient contact the pharmacy for the refill.) (If yes, when and what did the pharmacy advise?)    Preferred Pharmacy (with phone number or street name): [facility] on [address]    Agent: Please be advised that RX refills may take up to 3 business days. We ask that you follow-up with your pharmacy.

## 2021-01-31 DIAGNOSIS — N2581 Secondary hyperparathyroidism of renal origin: Secondary | ICD-10-CM | POA: Diagnosis not present

## 2021-01-31 DIAGNOSIS — D631 Anemia in chronic kidney disease: Secondary | ICD-10-CM | POA: Diagnosis not present

## 2021-01-31 DIAGNOSIS — R809 Proteinuria, unspecified: Secondary | ICD-10-CM | POA: Diagnosis not present

## 2021-01-31 DIAGNOSIS — R3129 Other microscopic hematuria: Secondary | ICD-10-CM | POA: Diagnosis not present

## 2021-01-31 DIAGNOSIS — M109 Gout, unspecified: Secondary | ICD-10-CM | POA: Diagnosis not present

## 2021-01-31 DIAGNOSIS — E1122 Type 2 diabetes mellitus with diabetic chronic kidney disease: Secondary | ICD-10-CM | POA: Diagnosis not present

## 2021-01-31 DIAGNOSIS — E785 Hyperlipidemia, unspecified: Secondary | ICD-10-CM | POA: Diagnosis not present

## 2021-01-31 DIAGNOSIS — N1831 Chronic kidney disease, stage 3a: Secondary | ICD-10-CM | POA: Diagnosis not present

## 2021-01-31 DIAGNOSIS — I129 Hypertensive chronic kidney disease with stage 1 through stage 4 chronic kidney disease, or unspecified chronic kidney disease: Secondary | ICD-10-CM | POA: Diagnosis not present

## 2021-02-09 ENCOUNTER — Other Ambulatory Visit: Payer: Self-pay | Admitting: Family Medicine

## 2021-02-09 DIAGNOSIS — M1A0790 Idiopathic chronic gout, unspecified ankle and foot, without tophus (tophi): Secondary | ICD-10-CM

## 2021-02-15 DIAGNOSIS — L2089 Other atopic dermatitis: Secondary | ICD-10-CM | POA: Diagnosis not present

## 2021-02-15 DIAGNOSIS — I872 Venous insufficiency (chronic) (peripheral): Secondary | ICD-10-CM | POA: Diagnosis not present

## 2021-07-20 DIAGNOSIS — E119 Type 2 diabetes mellitus without complications: Secondary | ICD-10-CM | POA: Diagnosis not present

## 2021-07-20 DIAGNOSIS — H04123 Dry eye syndrome of bilateral lacrimal glands: Secondary | ICD-10-CM | POA: Diagnosis not present

## 2021-07-20 DIAGNOSIS — H35373 Puckering of macula, bilateral: Secondary | ICD-10-CM | POA: Diagnosis not present

## 2021-07-20 DIAGNOSIS — H2513 Age-related nuclear cataract, bilateral: Secondary | ICD-10-CM | POA: Diagnosis not present

## 2021-07-29 DIAGNOSIS — N2581 Secondary hyperparathyroidism of renal origin: Secondary | ICD-10-CM | POA: Diagnosis not present

## 2021-07-29 DIAGNOSIS — M109 Gout, unspecified: Secondary | ICD-10-CM | POA: Diagnosis not present

## 2021-07-29 DIAGNOSIS — D631 Anemia in chronic kidney disease: Secondary | ICD-10-CM | POA: Diagnosis not present

## 2021-07-29 DIAGNOSIS — N189 Chronic kidney disease, unspecified: Secondary | ICD-10-CM | POA: Diagnosis not present

## 2021-07-29 DIAGNOSIS — E785 Hyperlipidemia, unspecified: Secondary | ICD-10-CM | POA: Diagnosis not present

## 2021-07-29 DIAGNOSIS — I129 Hypertensive chronic kidney disease with stage 1 through stage 4 chronic kidney disease, or unspecified chronic kidney disease: Secondary | ICD-10-CM | POA: Diagnosis not present

## 2021-07-29 DIAGNOSIS — N1831 Chronic kidney disease, stage 3a: Secondary | ICD-10-CM | POA: Diagnosis not present

## 2021-07-29 DIAGNOSIS — E1122 Type 2 diabetes mellitus with diabetic chronic kidney disease: Secondary | ICD-10-CM | POA: Diagnosis not present

## 2021-07-29 DIAGNOSIS — R3129 Other microscopic hematuria: Secondary | ICD-10-CM | POA: Diagnosis not present

## 2021-07-29 DIAGNOSIS — R809 Proteinuria, unspecified: Secondary | ICD-10-CM | POA: Diagnosis not present

## 2021-08-16 DIAGNOSIS — L2089 Other atopic dermatitis: Secondary | ICD-10-CM | POA: Diagnosis not present

## 2021-08-16 DIAGNOSIS — I872 Venous insufficiency (chronic) (peripheral): Secondary | ICD-10-CM | POA: Diagnosis not present

## 2021-08-16 DIAGNOSIS — L570 Actinic keratosis: Secondary | ICD-10-CM | POA: Diagnosis not present

## 2021-08-16 DIAGNOSIS — L821 Other seborrheic keratosis: Secondary | ICD-10-CM | POA: Diagnosis not present

## 2021-11-22 DIAGNOSIS — C61 Malignant neoplasm of prostate: Secondary | ICD-10-CM | POA: Diagnosis not present

## 2021-11-22 DIAGNOSIS — R3129 Other microscopic hematuria: Secondary | ICD-10-CM | POA: Diagnosis not present

## 2021-11-22 DIAGNOSIS — N281 Cyst of kidney, acquired: Secondary | ICD-10-CM | POA: Diagnosis not present

## 2021-12-15 ENCOUNTER — Telehealth: Payer: Self-pay | Admitting: Family Medicine

## 2021-12-15 NOTE — Telephone Encounter (Signed)
Pt dropped off copy of his Immunization records for provider to see and have on file. Document put at front office tray under providers name.

## 2021-12-16 NOTE — Telephone Encounter (Signed)
Vaccine records updated.

## 2022-01-19 ENCOUNTER — Ambulatory Visit (INDEPENDENT_AMBULATORY_CARE_PROVIDER_SITE_OTHER): Payer: Medicare Other | Admitting: Family Medicine

## 2022-01-19 VITALS — BP 120/70 | HR 70 | Temp 98.2°F | Resp 18 | Ht 71.0 in | Wt 309.2 lb

## 2022-01-19 DIAGNOSIS — E785 Hyperlipidemia, unspecified: Secondary | ICD-10-CM | POA: Diagnosis not present

## 2022-01-19 DIAGNOSIS — C61 Malignant neoplasm of prostate: Secondary | ICD-10-CM | POA: Diagnosis not present

## 2022-01-19 DIAGNOSIS — E1151 Type 2 diabetes mellitus with diabetic peripheral angiopathy without gangrene: Secondary | ICD-10-CM | POA: Diagnosis not present

## 2022-01-19 DIAGNOSIS — I10 Essential (primary) hypertension: Secondary | ICD-10-CM | POA: Diagnosis not present

## 2022-01-19 LAB — LIPID PANEL
Cholesterol: 102 mg/dL (ref 0–200)
HDL: 55.3 mg/dL (ref >39.00)
LDL Cholesterol: 31 mg/dL (ref 0–99)
NonHDL: 47.04
Total CHOL/HDL Ratio: 2
Triglycerides: 78 mg/dL (ref 0.0–149.0)
VLDL: 15.6 mg/dL (ref 0.0–40.0)

## 2022-01-19 LAB — COMPREHENSIVE METABOLIC PANEL
ALT: 16 U/L (ref 0–53)
AST: 14 U/L (ref 0–37)
Albumin: 4.1 g/dL (ref 3.5–5.2)
Alkaline Phosphatase: 78 U/L (ref 39–117)
BUN: 19 mg/dL (ref 6–23)
CO2: 37 mEq/L — ABNORMAL HIGH (ref 19–32)
Calcium: 10.2 mg/dL (ref 8.4–10.5)
Chloride: 99 mEq/L (ref 96–112)
Creatinine, Ser: 1.49 mg/dL (ref 0.40–1.50)
GFR: 45.23 mL/min — ABNORMAL LOW (ref >60.00)
Glucose, Bld: 89 mg/dL (ref 70–99)
Potassium: 3.7 mEq/L (ref 3.5–5.1)
Sodium: 141 mEq/L (ref 135–145)
Total Bilirubin: 0.7 mg/dL (ref 0.2–1.2)
Total Protein: 6.5 g/dL (ref 6.0–8.3)

## 2022-01-19 LAB — MICROALBUMIN / CREATININE URINE RATIO
Creatinine,U: 72 mg/dL
Microalb Creat Ratio: 1 mg/g (ref 0.0–30.0)
Microalb, Ur: <0.7 mg/dL (ref 0.0–1.9)

## 2022-01-19 LAB — HEMOGLOBIN A1C: Hgb A1c MFr Bld: 5.4 % (ref 4.6–6.5)

## 2022-01-19 MED ORDER — LOSARTAN POTASSIUM 100 MG PO TABS: 100.0000 mg | ORAL_TABLET | Freq: Every day | ORAL | 3 refills | Status: DC

## 2022-01-19 NOTE — Progress Notes (Signed)
Subjective:   By signing my name below, I, Shehryar Baig, attest that this documentation has been prepared under the direction and in the presence of Ann Held, DO. 01/19/2022    [name], male    DOB: 07-23-1945, 76 y.o.   MRN: [id number]  Chief Complaint  Patient presents with   Hypertension   Hyperlipidemia   Diabetes   Follow-up    Hypertension  Hyperlipidemia  Diabetes   Patient is in today for a follow up visit.   He reports on new issues with his feet. He is wearing compression socks on both legs.  He is measuring his blood sugars at home and reports they typically range from 90-91. Lab Results  Component Value Date   HGBA1C 5.4 01/19/2022   He continues following up with his oncologist. He reports his PSA levels have decreased. He notes his oncologist found spots on his pancreas and he underwent treatment for it.  He continues following up with his [place] provider.  He continues measuring his blood sugars at home and reports they are measuring normal.    Past Medical History:  Diagnosis Date   Arthritis    B12 deficiency    COPD (chronic obstructive pulmonary disease) ([place]) 01/2011   FVC 47%, FEV1 49%    Cough    associated with exposure to lumber yard   Family history of breast cancer    Family history of colon cancer    Family history of pancreatic cancer    Family history of prostate cancer    Family history of renal cancer    Hyperlipidemia    Hypertension    Lower extremity edema    Prostate cancer ([place])    Secondary pulmonary hypertension 01/2011   RV syst pressure 30-40 mm Hg on ECHO     Past Surgical History:  Procedure Laterality Date   COLONOSCOPY W/ POLYPECTOMY  12/19/07   one small polyp, sigmoid diverticulosis, lipoma rectum, agioectasia/AVM in descending colon (Dr. Ronelle Nigh at [facility] in [place], [place])   ENDOVENOUS [place] Left 09/18/2016   endovenous laser ablation left  greater saphenous vein by Tinnie Gens MD    ENDOVENOUS ABLATION SAPHENOUS VEIN W/ LASER Left 10/09/2016   endovenous laser ablation left small saphenous vein by Tinnie Gens MD    FINGER SURGERY Right 11   rt middle finger   PROSTATE BIOPSY     TOTAL SHOULDER ARTHROPLASTY Right 01/21/2013   Procedure: SHOULDER HEMI ARTHROPLASTY;  Surgeon: Garald Balding, MD;  Location: [facility];  Service: Orthopedics;  Laterality: Right;   TRANSTHORACIC ECHOCARDIOGRAM  01/2011   EF 55-60%, mild asymmetric LVH, elevated RV systolic pressure   VASECTOMY      Family History  Problem Relation Age of Onset   CAD Mother        MI in her 67s   Skin cancer Mother        multiple skin cancers removed from face and head   Cancer Father        colon and pancreas   Colon cancer Father    Pancreatic cancer Father    Epilepsy Son    Cancer Maternal Grandmother 46       "stomach"   Cancer Maternal Grandfather        prostate; died at the age of 15   Cancer Cousin        paternal first cousin kidney dx under 43   Breast  cancer Cousin        maternal first cousin with breast cancer over 34   Esophageal cancer Neg Hx     Social History   Socioeconomic History   Marital status: Married    Spouse name: Not on file   Number of children: 4   Years of education: Not on file   Highest education level: Not on file  Occupational History   Occupation: retired  Tobacco Use   Smoking status: Never   Smokeless tobacco: Never   Tobacco comments:    occ alcohol  Vaping Use   Vaping Use: Never used  Substance and Sexual Activity   Alcohol use: Yes    Alcohol/week: 0.0 standard drinks of alcohol    Comment: 2-3 drinks occasionally   Drug use: No   Sexual activity: Yes  Other Topics Concern   Not on file  Social History Narrative   Mr. Quillin lives with his wife in [place]. He is recently retired and relocated to [facility] [place] from [place]. Historically, his medical provider has been [facility] in [place].   Social  Determinants of Health   Financial Resource Strain: Not on file  Food Insecurity: Not on file  Transportation Needs: Not on file  Physical Activity: Not on file  Stress: Not on file  Social Connections: Not on file  Intimate Partner Violence: Not on file    Outpatient Medications Prior to Visit  Medication Sig Dispense Refill   albuterol (PROVENTIL HFA;VENTOLIN HFA) 108 (90 BASE) MCG/ACT inhaler Inhale 2 puffs into the lungs every 6 (six) hours as needed for wheezing or shortness of breath.     allopurinol (ZYLOPRIM) 300 MG tablet TAKE 1 TABLET BY MOUTH EVERY DAY. Pt needs office visit for further refills 14 tablet 0   aspirin EC 81 MG tablet Take 81 mg by mouth daily.     chlorthalidone (HYGROTON) 25 MG tablet Take 25 mg by mouth daily.     diltiazem (CARDIZEM CD) 300 MG 24 hr capsule Take 1 capsule (300 mg total) by mouth daily. 90 capsule 3   GARLIC PO Take 1 capsule by mouth daily.     KRILL OIL PO Take 1 capsule by mouth daily.     tiotropium (SPIRIVA) 18 MCG inhalation capsule Place 18 mcg into inhaler and inhale daily.     losartan (COZAAR) 100 MG tablet Take 100 mg by mouth daily.     LORazepam (ATIVAN) 1 MG tablet Take 1 tablet (1 mg total) by mouth as needed for anxiety (take one tablet 30 minutes prior to MRI and may repeat once, just prior to scan if needed.). (Patient not taking: Reported on 01/27/2020) 2 tablet 0   ONE TOUCH ULTRA TEST test strip CHECK BLOOD SUGAR TWICE DAILY (Patient not taking: Reported on 01/27/2020) 100 each 4   ONETOUCH DELICA LANCETS FINE MISC Check blood sugar daily (Patient not taking: Reported on 01/27/2020) 100 each 12   Facility-Administered Medications Prior to Visit  Medication Dose Route Frequency Provider Last Rate Last Admin   0.9 %  sodium chloride infusion  500 mL Intravenous Once Milus Banister, MD        No Known Allergies  ROS     Objective:    Physical Exam Constitutional:      General: He is not in acute distress.     Appearance: Normal appearance. He is not ill-appearing.  HENT:     Head: Normocephalic and atraumatic.     Right Ear: External  ear normal.     Left Ear: External ear normal.  Eyes:     Extraocular Movements: Extraocular movements intact.     Pupils: Pupils are equal, round, and reactive to light.  Cardiovascular:     Rate and Rhythm: Normal rate and regular rhythm.     Heart sounds: Normal heart sounds. No murmur heard.    No gallop.  Pulmonary:     Effort: Pulmonary effort is normal. No respiratory distress.     Breath sounds: Normal breath sounds. No wheezing or rales.  Skin:    General: Skin is warm and dry.  Neurological:     Mental Status: He is alert and oriented to person, place, and time.  Psychiatric:        Judgment: Judgment normal.     BP 120/70 (BP Location: Left Arm, Patient Position: Sitting, Cuff Size: Large)   Pulse 70   Temp 98.2 F (36.8 C) (Oral)   Resp 18   Ht '5\' 11"'$  (1.803 m)   Wt (!) 309 lb 3.2 oz (140.3 kg)   SpO2 95%   BMI 43.12 kg/m  Wt Readings from Last 3 Encounters:  01/19/22 (!) 309 lb 3.2 oz (140.3 kg)  02/09/20 (!) 315 lb (142.9 kg)  01/27/20 (!) 315 lb (142.9 kg)    Diabetic Foot Exam - Simple   Simple Foot Form Diabetic Foot exam was performed with the following findings: Yes 01/19/2022 11:58 AM  Visual Inspection No deformities, no ulcerations, no other skin breakdown bilaterally: Yes Sensation Testing Intact to touch and monofilament testing bilaterally: Yes Pulse Check Posterior Tibialis and Dorsalis pulse intact bilaterally: Yes Comments    Lab Results  Component Value Date   WBC 6.2 06/27/2017   HGB 15.0 06/27/2017   HCT 44.8 06/27/2017   PLT 203.0 06/27/2017   GLUCOSE 89 01/19/2022   CHOL 102 01/19/2022   TRIG 78.0 01/19/2022   HDL 55.30 01/19/2022   LDLDIRECT 59.0 03/01/2017   LDLCALC 31 01/19/2022   ALT 16 01/19/2022   AST 14 01/19/2022   NA 141 01/19/2022   K 3.7 01/19/2022   CL 99 01/19/2022   CREATININE  1.49 01/19/2022   BUN 19 01/19/2022   CO2 37 (H) 01/19/2022   TSH 0.736 09/01/2016   PSA 4.96 (H) 02/26/2015   INR 1.23 09/01/2016   HGBA1C 5.4 01/19/2022   MICROALBUR <0.7 01/19/2022    Lab Results  Component Value Date   TSH 0.736 09/01/2016   Lab Results  Component Value Date   WBC 6.2 06/27/2017   HGB 15.0 06/27/2017   HCT 44.8 06/27/2017   MCV 87.6 06/27/2017   PLT 203.0 06/27/2017   Lab Results  Component Value Date   NA 141 01/19/2022   K 3.7 01/19/2022   CO2 37 (H) 01/19/2022   GLUCOSE 89 01/19/2022   BUN 19 01/19/2022   CREATININE 1.49 01/19/2022   BILITOT 0.7 01/19/2022   ALKPHOS 78 01/19/2022   AST 14 01/19/2022   ALT 16 01/19/2022   PROT 6.5 01/19/2022   ALBUMIN 4.1 01/19/2022   CALCIUM 10.2 01/19/2022   ANIONGAP 7 02/19/2017   GFR 45.23 (L) 01/19/2022   Lab Results  Component Value Date   CHOL 102 01/19/2022   Lab Results  Component Value Date   HDL 55.30 01/19/2022   Lab Results  Component Value Date   LDLCALC 31 01/19/2022   Lab Results  Component Value Date   TRIG 78.0 01/19/2022   Lab Results  Component Value  Date   CHOLHDL 2 01/19/2022   Lab Results  Component Value Date   HGBA1C 5.4 01/19/2022       Assessment & Plan:   Problem List Items Addressed This Visit       Unprioritized   Severe obesity (BMI >= 40) (HCC)   Relevant Orders   Comprehensive metabolic panel (Completed)   Hemoglobin A1c (Completed)   Lipid panel (Completed)   Microalbumin / creatinine urine ratio (Completed)   Malignant neoplasm of prostate (HCC)   Relevant Orders   Comprehensive metabolic panel (Completed)   Hemoglobin A1c (Completed)   Lipid panel (Completed)   Microalbumin / creatinine urine ratio (Completed)   Hypertension   Relevant Medications   losartan (COZAAR) 100 MG tablet   Hyperlipidemia    Encourage heart healthy diet such as MIND or DASH diet, increase exercise, avoid trans fats, simple carbohydrates and processed foods,  consider a krill or fish or flaxseed oil cap daily.        Relevant Medications   losartan (COZAAR) 100 MG tablet   Other Relevant Orders   Comprehensive metabolic panel (Completed)   Hemoglobin A1c (Completed)   Lipid panel (Completed)   Microalbumin / creatinine urine ratio (Completed)   Essential hypertension, benign (Chronic)    Well controlled, no changes to meds. Encouraged heart healthy diet such as the DASH diet and exercise as tolerated.        Relevant Medications   losartan (COZAAR) 100 MG tablet   DM (diabetes mellitus) type II, controlled, with peripheral vascular disorder (HCC)   Relevant Medications   losartan (COZAAR) 100 MG tablet   Other Relevant Orders   Comprehensive metabolic panel (Completed)   Hemoglobin A1c (Completed)   Lipid panel (Completed)   Microalbumin / creatinine urine ratio (Completed)   Other Visit Diagnoses     Prostate cancer ([place])    -  Primary        Meds ordered this encounter  Medications   losartan (COZAAR) 100 MG tablet    Sig: Take 1 tablet (100 mg total) by mouth daily.    Dispense:  90 tablet    Refill:  3    I, Ann Held, DO, personally preformed the services described in this documentation.  All medical record entries made by the scribe were at my direction and in my presence.  I have reviewed the chart and discharge instructions (if applicable) and agree that the record reflects my personal performance and is accurate and complete. 01/19/2022   I,Shehryar Baig,acting as a scribe for Ann Held, DO.,have documented all relevant documentation on the behalf of Ann Held, DO,as directed by  Ann Held, DO while in the presence of Ann Held, DO.   Ann Held, DO

## 2022-01-19 NOTE — Patient Instructions (Signed)

## 2022-01-23 ENCOUNTER — Encounter: Payer: Self-pay | Admitting: Family Medicine

## 2022-01-23 NOTE — Assessment & Plan Note (Signed)
Encourage heart healthy diet such as MIND or DASH diet, increase exercise, avoid trans fats, simple carbohydrates and processed foods, consider a krill or fish or flaxseed oil cap daily.  °

## 2022-01-23 NOTE — Assessment & Plan Note (Signed)
Well controlled, no changes to meds. Encouraged heart healthy diet such as the DASH diet and exercise as tolerated.  °

## 2022-02-09 DIAGNOSIS — N1831 Chronic kidney disease, stage 3a: Secondary | ICD-10-CM | POA: Diagnosis not present

## 2022-02-09 DIAGNOSIS — N179 Acute kidney failure, unspecified: Secondary | ICD-10-CM | POA: Diagnosis not present

## 2022-02-09 DIAGNOSIS — I129 Hypertensive chronic kidney disease with stage 1 through stage 4 chronic kidney disease, or unspecified chronic kidney disease: Secondary | ICD-10-CM | POA: Diagnosis not present

## 2022-02-09 DIAGNOSIS — E1122 Type 2 diabetes mellitus with diabetic chronic kidney disease: Secondary | ICD-10-CM | POA: Diagnosis not present

## 2022-02-09 DIAGNOSIS — R809 Proteinuria, unspecified: Secondary | ICD-10-CM | POA: Diagnosis not present

## 2022-02-09 DIAGNOSIS — R3129 Other microscopic hematuria: Secondary | ICD-10-CM | POA: Diagnosis not present

## 2022-02-09 DIAGNOSIS — E785 Hyperlipidemia, unspecified: Secondary | ICD-10-CM | POA: Diagnosis not present

## 2022-02-10 ENCOUNTER — Other Ambulatory Visit: Payer: Self-pay | Admitting: Family Medicine

## 2022-02-10 DIAGNOSIS — M1A0790 Idiopathic chronic gout, unspecified ankle and foot, without tophus (tophi): Secondary | ICD-10-CM

## 2022-02-15 DIAGNOSIS — L821 Other seborrheic keratosis: Secondary | ICD-10-CM | POA: Diagnosis not present

## 2022-02-15 DIAGNOSIS — L2089 Other atopic dermatitis: Secondary | ICD-10-CM | POA: Diagnosis not present

## 2022-02-15 DIAGNOSIS — I872 Venous insufficiency (chronic) (peripheral): Secondary | ICD-10-CM | POA: Diagnosis not present

## 2022-02-15 DIAGNOSIS — L298 Other pruritus: Secondary | ICD-10-CM | POA: Diagnosis not present

## 2022-06-15 LAB — LAB REPORT - SCANNED
A1c: 5.4
EGFR (Non-African Amer.): 41

## 2022-07-20 ENCOUNTER — Encounter: Payer: Self-pay | Admitting: Family Medicine

## 2022-07-20 ENCOUNTER — Other Ambulatory Visit: Payer: Self-pay | Admitting: Family Medicine

## 2022-07-20 ENCOUNTER — Ambulatory Visit (INDEPENDENT_AMBULATORY_CARE_PROVIDER_SITE_OTHER): Payer: Medicare Other | Admitting: Family Medicine

## 2022-07-20 VITALS — BP 150/78 | HR 76 | Ht 71.0 in | Wt 313.4 lb

## 2022-07-20 DIAGNOSIS — E1151 Type 2 diabetes mellitus with diabetic peripheral angiopathy without gangrene: Secondary | ICD-10-CM | POA: Diagnosis not present

## 2022-07-20 DIAGNOSIS — J449 Chronic obstructive pulmonary disease, unspecified: Secondary | ICD-10-CM

## 2022-07-20 DIAGNOSIS — E876 Hypokalemia: Secondary | ICD-10-CM

## 2022-07-20 DIAGNOSIS — E1169 Type 2 diabetes mellitus with other specified complication: Secondary | ICD-10-CM

## 2022-07-20 DIAGNOSIS — I10 Essential (primary) hypertension: Secondary | ICD-10-CM

## 2022-07-20 DIAGNOSIS — C61 Malignant neoplasm of prostate: Secondary | ICD-10-CM

## 2022-07-20 DIAGNOSIS — E785 Hyperlipidemia, unspecified: Secondary | ICD-10-CM

## 2022-07-20 LAB — BASIC METABOLIC PANEL
BUN: 22 mg/dL (ref 6–23)
CO2: 34 mEq/L — ABNORMAL HIGH (ref 19–32)
Calcium: 9.7 mg/dL (ref 8.4–10.5)
Chloride: 100 mEq/L (ref 96–112)
Creatinine, Ser: 1.5 mg/dL (ref 0.40–1.50)
GFR: 44.71 mL/min — ABNORMAL LOW (ref >60.00)
Glucose, Bld: 107 mg/dL — ABNORMAL HIGH (ref 70–99)
Potassium: 3.5 mEq/L (ref 3.5–5.1)
Sodium: 142 mEq/L (ref 135–145)

## 2022-07-20 MED ORDER — GLUCOSE BLOOD VI STRP: ORAL_STRIP | 12 refills | Status: DC

## 2022-07-20 NOTE — Assessment & Plan Note (Signed)
Per pulmonary Stable  

## 2022-07-20 NOTE — Assessment & Plan Note (Signed)
Tolerating statin, encouraged heart healthy diet, avoid trans fats, minimize simple carbs and saturated fats. Increase exercise as tolerated 

## 2022-07-20 NOTE — Assessment & Plan Note (Signed)
hgba1c to be checked, minimize simple carbs. Increase exercise as tolerated. Continue current meds  

## 2022-07-20 NOTE — Assessment & Plan Note (Signed)
Per oncology °

## 2022-07-20 NOTE — Progress Notes (Signed)
Subjective:   By signing my name below, I, Travis Daniels, attest that this documentation has been prepared under the direction and in the presence of Travis Schultz, DO. 07/20/2022   [name], male    DOB: 09-22-1945, 76 y.o.   MRN: [id number]  Chief Complaint  Patient presents with   Follow-up    Refill glucose strips     HPI Patient is in today for a follow up visit.   His last potassium levels were low so he was given potassium supplements by another provider to take.  She is requesting a refill on glucose blood strips.  She continues taking 300 mg allopurinol daily PO and reports no new issues while taking it.  He has some swelling in his feet.    Past Medical History:  Diagnosis Date   Arthritis    B12 deficiency    COPD (chronic obstructive pulmonary disease) (HCC) 01/2011   FVC 47%, FEV1 49%    Cough    associated with exposure to lumber yard   Family history of breast cancer    Family history of colon cancer    Family history of pancreatic cancer    Family history of prostate cancer    Family history of renal cancer    Hyperlipidemia    Hypertension    Lower extremity edema    Prostate cancer (HCC)    Secondary pulmonary hypertension 01/2011   RV syst pressure 30-40 mm Hg on ECHO     Past Surgical History:  Procedure Laterality Date   COLONOSCOPY W/ POLYPECTOMY  12/19/07   one small polyp, sigmoid diverticulosis, lipoma rectum, agioectasia/AVM in descending colon (Dr. Newman Nip at [facility] in [place], [place])   ENDOVENOUS ABLATION SAPHENOUS VEIN W/ LASER Left 09/18/2016   endovenous laser ablation left greater saphenous vein by Josephina Gip MD    ENDOVENOUS ABLATION SAPHENOUS VEIN W/ LASER Left 10/09/2016   endovenous laser ablation left small saphenous vein by Josephina Gip MD    FINGER SURGERY Right 11   rt middle finger   PROSTATE BIOPSY     TOTAL SHOULDER ARTHROPLASTY Right 01/21/2013   Procedure: SHOULDER HEMI ARTHROPLASTY;   Surgeon: Valeria Batman, MD;  Location: [facility] OR;  Service: Orthopedics;  Laterality: Right;   TRANSTHORACIC ECHOCARDIOGRAM  01/2011   EF 55-60%, mild asymmetric LVH, elevated RV systolic pressure   VASECTOMY      Family History  Problem Relation Age of Onset   CAD Mother        MI in her 51s   Skin cancer Mother        multiple skin cancers removed from face and head   Cancer Father        colon and pancreas   Colon cancer Father    Pancreatic cancer Father    Epilepsy Son    Cancer Maternal Grandmother 106       "stomach"   Cancer Maternal Grandfather        prostate; died at the age of 100   Cancer Cousin        paternal first cousin kidney dx under 63   Breast cancer Cousin        maternal first cousin with breast cancer over 38   Esophageal cancer Neg Hx     Social History   Socioeconomic History   Marital status: Married    Spouse name: Not on file   Number of children: 4   Years  of education: Not on file   Highest education level: Not on file  Occupational History   Occupation: retired  Tobacco Use   Smoking status: Never   Smokeless tobacco: Never   Tobacco comments:    occ alcohol  Vaping Use   Vaping Use: Never used  Substance and Sexual Activity   Alcohol use: Yes    Alcohol/week: 0.0 standard drinks of alcohol    Comment: 2-3 drinks occasionally   Drug use: No   Sexual activity: Yes  Other Topics Concern   Not on file  Social History Narrative   [name] lives with his wife in [place]. He is recently retired and relocated to [facility] from [place]. Historically, his medical provider has been [facility] in [place].   Social Determinants of Health   Financial Resource Strain: Not on file  Food Insecurity: Not on file  Transportation Needs: Not on file  Physical Activity: Not on file  Stress: Not on file  Social Connections: Not on file  Intimate Partner Violence: Not on file    Outpatient Medications Prior to Visit  Medication Sig Dispense Refill    albuterol (PROVENTIL HFA;VENTOLIN HFA) 108 (90 BASE) MCG/ACT inhaler Inhale 2 puffs into the lungs every 6 (six) hours as needed for wheezing or shortness of breath.     allopurinol (ZYLOPRIM) 300 MG tablet TAKE 1 TABLET BY MOUTH EVERY DAY. Pt needs office visit for further refills 14 tablet 0   chlorthalidone (HYGROTON) 25 MG tablet Take 25 mg by mouth daily.     diltiazem (CARDIZEM CD) 300 MG 24 hr capsule Take 1 capsule (300 mg total) by mouth daily. 90 capsule 3   GARLIC PO Take 1 capsule by mouth daily.     KRILL OIL PO Take 1 capsule by mouth daily.     LORazepam (ATIVAN) 1 MG tablet Take 1 tablet (1 mg total) by mouth as needed for anxiety (take one tablet 30 minutes prior to MRI and may repeat once, just prior to scan if needed.). 2 tablet 0   losartan (COZAAR) 100 MG tablet Take 1 tablet (100 mg total) by mouth daily. 90 tablet 3   tiotropium (SPIRIVA) 18 MCG inhalation capsule Place 18 mcg into inhaler and inhale daily.     aspirin EC 81 MG tablet Take 81 mg by mouth daily. (Patient not taking: Reported on 07/20/2022)     ONETOUCH DELICA LANCETS FINE MISC Check blood sugar daily (Patient not taking: Reported on 01/27/2020) 100 each 12   ONE TOUCH ULTRA TEST test strip CHECK BLOOD SUGAR TWICE DAILY (Patient not taking: Reported on 01/27/2020) 100 each 4   Facility-Administered Medications Prior to Visit  Medication Dose Route Frequency Provider Last Rate Last Admin   0.9 %  sodium chloride infusion  500 mL Intravenous Once Rachael Fee, MD        No Known Allergies  Review of Systems  Constitutional:  Negative for fever and malaise/fatigue.  HENT:  Negative for congestion.   Eyes:  Negative for blurred vision.  Respiratory:  Negative for cough and shortness of breath.   Cardiovascular:  Negative for chest pain, palpitations and leg swelling.  Gastrointestinal:  Negative for abdominal pain, blood in stool, nausea and vomiting.  Genitourinary:  Negative for dysuria and frequency.   Musculoskeletal:  Negative for back pain and falls.  Skin:  Negative for rash.  Neurological:  Negative for dizziness, loss of consciousness and headaches.  Endo/Heme/Allergies:  Negative for environmental allergies.  Psychiatric/Behavioral:  Negative for depression. The patient is not nervous/anxious.        Objective:    Physical Exam Vitals and nursing note reviewed.  Constitutional:      General: He is not in acute distress.    Appearance: Normal appearance. He is not ill-appearing.  HENT:     Head: Normocephalic and atraumatic.     Right Ear: External ear normal.     Left Ear: External ear normal.  Eyes:     Extraocular Movements: Extraocular movements intact.     Pupils: Pupils are equal, round, and reactive to light.  Cardiovascular:     Rate and Rhythm: Normal rate and regular rhythm.     Heart sounds: Normal heart sounds. No murmur heard.    No gallop.  Pulmonary:     Effort: Pulmonary effort is normal. No respiratory distress.     Breath sounds: Normal breath sounds. No wheezing or rales.  Skin:    General: Skin is warm and dry.  Neurological:     General: No focal deficit present.     Mental Status: He is alert and oriented to person, place, and time.  Psychiatric:        Mood and Affect: Mood normal.        Judgment: Judgment normal.     BP (!) 150/78 (BP Location: Left Arm, Patient Position: Sitting, Cuff Size: Large)   Pulse 76   Ht 5\' 11"  (1.803 m)   Wt (!) 313 lb 6.4 oz (142.2 kg)   SpO2 97%   BMI 43.71 kg/m  Wt Readings from Last 3 Encounters:  07/20/22 (!) 313 lb 6.4 oz (142.2 kg)  01/19/22 (!) 309 lb 3.2 oz (140.3 kg)  02/09/20 (!) 315 lb (142.9 kg)       Assessment & Plan:  Hypokalemia -     Basic metabolic panel  DM (diabetes mellitus) type II, controlled, with peripheral vascular disorder (HCC) Assessment & Plan: hgba1c to be checked , minimize simple carbs. Increase exercise as tolerated. Continue current meds   Orders: -      Glucose Blood; Use as instructed  Dispense: 100 each; Refill: 12  Chronic obstructive pulmonary disease, unspecified COPD type (HCC) Assessment & Plan: Per pulmonary Stable    Essential hypertension, benign Assessment & Plan: Well controlled, no changes to meds. Encouraged heart healthy diet such as the DASH diet and exercise as tolerated.     Hyperlipidemia associated with type 2 diabetes mellitus (HCC) Assessment & Plan: Tolerating statin, encouraged heart healthy diet, avoid trans fats, minimize simple carbs and saturated fats. Increase exercise as tolerated    Malignant neoplasm of prostate [facility]) Assessment & Plan: Per oncology   Primary hypertension Assessment & Plan: Well controlled, no changes to meds. Encouraged heart healthy diet such as the DASH diet and exercise as tolerated.       IDonato Schultz, DO, personally preformed the services described in this documentation.  All medical record entries made by the scribe were at my direction and in my presence.  I have reviewed the chart and discharge instructions (if applicable) and agree that the record reflects my personal performance and is accurate and complete. 07/20/2022   I,Travis Daniels,acting as a scribe for Travis Schultz, DO.,have documented all relevant documentation on the behalf of Travis Schultz, DO,as directed by  Travis Schultz, DO while in the presence of Travis Schultz, DO.   Lelon Perla  Chase, DO

## 2022-07-20 NOTE — Assessment & Plan Note (Signed)
Well controlled, no changes to meds. Encouraged heart healthy diet such as the DASH diet and exercise as tolerated.  °

## 2022-07-27 DIAGNOSIS — H40013 Open angle with borderline findings, low risk, bilateral: Secondary | ICD-10-CM | POA: Diagnosis not present

## 2022-07-27 DIAGNOSIS — H524 Presbyopia: Secondary | ICD-10-CM | POA: Diagnosis not present

## 2022-07-27 DIAGNOSIS — H04123 Dry eye syndrome of bilateral lacrimal glands: Secondary | ICD-10-CM | POA: Diagnosis not present

## 2022-07-27 DIAGNOSIS — H35033 Hypertensive retinopathy, bilateral: Secondary | ICD-10-CM | POA: Diagnosis not present

## 2022-07-27 DIAGNOSIS — E119 Type 2 diabetes mellitus without complications: Secondary | ICD-10-CM | POA: Diagnosis not present

## 2022-08-09 DIAGNOSIS — I129 Hypertensive chronic kidney disease with stage 1 through stage 4 chronic kidney disease, or unspecified chronic kidney disease: Secondary | ICD-10-CM | POA: Diagnosis not present

## 2022-08-09 DIAGNOSIS — E1122 Type 2 diabetes mellitus with diabetic chronic kidney disease: Secondary | ICD-10-CM | POA: Diagnosis not present

## 2022-08-09 DIAGNOSIS — E785 Hyperlipidemia, unspecified: Secondary | ICD-10-CM | POA: Diagnosis not present

## 2022-08-09 DIAGNOSIS — N1832 Chronic kidney disease, stage 3b: Secondary | ICD-10-CM | POA: Diagnosis not present

## 2022-08-09 DIAGNOSIS — R809 Proteinuria, unspecified: Secondary | ICD-10-CM | POA: Diagnosis not present

## 2022-11-22 ENCOUNTER — Telehealth: Payer: Self-pay | Admitting: Family Medicine

## 2022-11-22 ENCOUNTER — Encounter: Payer: Self-pay | Admitting: Physician Assistant

## 2022-11-22 ENCOUNTER — Ambulatory Visit (INDEPENDENT_AMBULATORY_CARE_PROVIDER_SITE_OTHER): Payer: Medicare Other | Admitting: Physician Assistant

## 2022-11-22 VITALS — BP 126/64 | HR 72 | Temp 98.1°F | Resp 18 | Ht 73.0 in | Wt 308.8 lb

## 2022-11-22 DIAGNOSIS — H1032 Unspecified acute conjunctivitis, left eye: Secondary | ICD-10-CM

## 2022-11-22 DIAGNOSIS — L03211 Cellulitis of face: Secondary | ICD-10-CM

## 2022-11-22 DIAGNOSIS — B029 Zoster without complications: Secondary | ICD-10-CM

## 2022-11-22 DIAGNOSIS — L0100 Impetigo, unspecified: Secondary | ICD-10-CM | POA: Diagnosis not present

## 2022-11-22 MED ORDER — VALACYCLOVIR HCL 1 G PO TABS: 1000.0000 mg | ORAL_TABLET | Freq: Three times a day (TID) | ORAL | 0 refills | Status: AC

## 2022-11-22 MED ORDER — MUPIROCIN 2 % EX OINT: 1.0000 | TOPICAL_OINTMENT | Freq: Two times a day (BID) | CUTANEOUS | 0 refills | Status: DC

## 2022-11-22 MED ORDER — POLYMYXIN B-TRIMETHOPRIM 10000-0.1 UNIT/ML-% OP SOLN: 1.0000 [drp] | OPHTHALMIC | 0 refills | Status: AC

## 2022-11-22 MED ORDER — AMOXICILLIN-POT CLAVULANATE 875-125 MG PO TABS: 1.0000 | ORAL_TABLET | Freq: Two times a day (BID) | ORAL | 0 refills | Status: AC

## 2022-11-22 NOTE — Telephone Encounter (Signed)
Initial Comment Caller said puffiness around his eye. His eye has been watering and sometimes it is itchy. It feels like there is a scab if you run your finger over it but no scab. He said it is to the left side and to the bottom of his eye. He said he also has a spot on his belly. He is not sure if it is a rash. Additional Comment Transferred from office Translation No Nurse Assessment Nurse: [place], RN, Marchelle Folks Date/Time (Eastern Time): 11/22/2022 2:17:31 PM Confirm and document reason for call. If symptomatic, describe symptoms. ---caller states a week ago he felt like he got something in his eye. it was watering. then he got it out and it was fine. now along side the eye down the cheek there is a rough patch. does not hurt. it will itch at times. the eye is puffy and no pain. is red from rubbing it. blood shot. on the naval is a small area that seems the same as near the eye area. Does the patient have any new or worsening symptoms? ---Yes Will a triage be completed? ---Yes Related visit to physician within the last 2 weeks? ---No Does the PT have any chronic conditions? (i.e. diabetes, asthma, this includes High risk factors for pregnancy, etc.) ---No Is this a behavioral health or substance abuse call? ---No Guidelines Guideline Title Affirmed Question Affirmed Notes Nurse Date/Time (Eastern Time) Rash or Redness - Localized [1] Localized rash is very painful AND [2] no fever Humfleet, RN, Marchelle Folks 11/22/2022 2:21:08 PM PLEASE NOTE: All timestamps contained within this report are represented as [place] Standard Time. CONFIDENTIALTY NOTICE: This fax transmission is intended only for the addressee. It contains information that is legally privileged, confidential or otherwise protected from use or disclosure. If you are not the intended recipient, you are strictly prohibited from reviewing, disclosing, copying using or disseminating any of this information or taking any  action in reliance on or regarding this information. If you have received this fax in error, please notify us immediately by telephone so that we can arrange for its return to [place]. Phone: [phone], Toll-Free: [phone], Fax: [phone] Page: 2 of 2 Call Id: [id number] Disp. Time Lamount Cohen Time) Disposition Final User 11/22/2022 2:23:46 PM See HCP within 4 Hours (or PCP triage) Yes Humfleet, RN, Marchelle Folks Final Disposition 11/22/2022 2:23:46 PM See HCP within 4 Hours (or PCP triage) Yes [place]et, RN, Marchelle Folks Disposition Overriden: See PCP within 24 Hours Override Reason: Patient's symptoms need a higher level of care Caller Disagree/Comply Comply Caller Understands Yes PreDisposition InappropriateToAsk Care Advice Given Per Guideline SEE HCP (OR PCP TRIAGE) WITHIN 4 HOURS: CARE ADVICE given per Rash or Redness - Localized (Adult) guideline. CALL BACK IF: * You become worse * IF OFFICE WILL BE OPEN: You need to be seen within the next 3 or 4 hours. Call your doctor (or NP/PA) now or as soon as the office opens. Comments User: Jaclynn Major, RN Date/Time Lamount Cohen Time): 11/22/2022 2:26:07 PM possible shingles??? rash that is rough, on one side of the face near the eye with eye swelling. no fever. the area is itchy with pain at times. concerns due to the rash being next to the eye with eye puffiness Referrals REFERRED TO PCP OFFICE

## 2022-11-22 NOTE — Telephone Encounter (Signed)
Noted  

## 2022-11-22 NOTE — Telephone Encounter (Signed)
FYI: This call has been transferred to triage nurse: the Triage Nurse. Once the result note has been entered staff can address the message at that time.  Patient called in with the following symptoms:  Red Word:swelling of eyes, lips, face and tounge   Please advise at Mobile [phone] (mobile)  Message is routed to Provider Pool.

## 2022-11-22 NOTE — Telephone Encounter (Signed)
Spoke with triage. Pt scheduled with Mardella Layman this afternoon. Access nurse advised patient was not having tongue swelling.

## 2022-11-22 NOTE — Progress Notes (Signed)
Established patient visit   Patient: Travis Daniels   DOB: 1945/08/25   77 y.o. Male  MRN: [id number] Visit Date: 11/22/2022  Today's healthcare provider: Alfredia Ferguson, PA-C   Chief Complaint  Patient presents with   Rash    Left eye swelling Some drainage and irritation for one day. Also has a spot on his belly button   Subjective    HPI  Patient reports today with 1 day of left eye swelling, redness, drainage and pain.  He reports there is significant swelling around his eye and there was some type of rash.  He did not take a close of look to see if they were vesicular but he scrubbed it and use hydroperoxide on the area.  Denies any vision changes, blurred vision double vision spots.  Denies any pain with eye movements.  Reports he felt something in his eye for the last few days.  He also reports a rash around his bellybutton that is painful.  Medications: Outpatient Medications Prior to Visit  Medication Sig   albuterol (PROVENTIL HFA;VENTOLIN HFA) 108 (90 BASE) MCG/ACT inhaler Inhale 2 puffs into the lungs every 6 (six) hours as needed for wheezing or shortness of breath.   allopurinol (ZYLOPRIM) 300 MG tablet TAKE 1 TABLET BY MOUTH EVERY DAY. Pt needs office visit for further refills   aspirin EC 81 MG tablet Take 81 mg by mouth daily. (Patient not taking: Reported on 07/20/2022)   chlorthalidone (HYGROTON) 25 MG tablet Take 25 mg by mouth daily.   diltiazem (CARDIZEM CD) 300 MG 24 hr capsule Take 1 capsule (300 mg total) by mouth daily.   GARLIC PO Take 1 capsule by mouth daily.   glucose blood (ACCU-CHEK GUIDE) test strip Check blood sugars once daily   KRILL OIL PO Take 1 capsule by mouth daily.   LORazepam (ATIVAN) 1 MG tablet Take 1 tablet (1 mg total) by mouth as needed for anxiety (take one tablet 30 minutes prior to MRI and may repeat once, just prior to scan if needed.).   losartan (COZAAR) 100 MG tablet Take 1 tablet (100 mg total) by mouth daily.   ONETOUCH  DELICA LANCETS FINE MISC Check blood sugar daily (Patient not taking: Reported on 01/27/2020)   tiotropium (SPIRIVA) 18 MCG inhalation capsule Place 18 mcg into inhaler and inhale daily.   Facility-Administered Medications Prior to Visit  Medication Dose Route Frequency Provider   0.9 %  sodium chloride infusion  500 mL Intravenous Once Rachael Fee, MD    Review of Systems  Constitutional:  Negative for fatigue and fever.  Eyes:  Positive for pain, discharge and redness.  Respiratory:  Negative for cough and shortness of breath.   Cardiovascular:  Negative for chest pain, palpitations and leg swelling.  Skin:  Positive for rash.  Neurological:  Negative for dizziness and headaches.      Objective    BP 126/64 (BP Location: Left Arm, Patient Position: Sitting, Cuff Size: Large)   Pulse 72   Temp 98.1 F (36.7 C) (Oral)   Resp 18   Ht 6\' 1"  (1.854 m)   Wt (!) 308 lb 12.8 oz (140.1 kg)   SpO2 96%   BMI 40.74 kg/m   Physical Exam Vitals reviewed.  Constitutional:      Appearance: He is not ill-appearing.  HENT:     Head: Normocephalic.  Eyes:     General:        Left eye: Discharge  present.    Extraocular Movements: Extraocular movements intact.     Pupils: Pupils are equal, round, and reactive to light.     Comments: Left eye conjunctiva with erythema and yellow discharge.  Eyelid is swollen surrounding area of the orbit is erythematous and swollen particularly under the eye.  Adjacent to the eye there is an area of a scabbed over rash with clear/yellow discharge.  They do appear to be grouped/individual lesions potentially could have been vesicular at 1 point.  Cardiovascular:     Rate and Rhythm: Normal rate.  Pulmonary:     Effort: Pulmonary effort is normal. No respiratory distress.  Skin:    Comments: Along patient's umbilicus there is a scabbed erythematous rash with a slight amount of yellow discharge.  Neurological:     General: No focal deficit present.      Mental Status: He is alert and oriented to person, place, and time.  Psychiatric:        Mood and Affect: Mood normal.        Behavior: Behavior normal.      No results found for any visits on 11/22/22.  Assessment & Plan     1. Acute bacterial conjunctivitis of left eye Recommending ear erythromycin ointment, patient does not think he could apply this himself.  Will send in eyedrops instead - trimethoprim-polymyxin b (POLYTRIM) ophthalmic solution; Place 1 drop into the left eye every 4 (four) hours.  Dispense: 10 mL; Refill: 0  2. Cellulitis of face Prescribing Augmentin twice daily for 7 days - amoxicillin-clavulanate (AUGMENTIN) 875-125 MG tablet; Take 1 tablet by mouth 2 (two) times daily for 7 days.  Dispense: 14 tablet; Refill: 0  3. Herpes zoster without complication Given patient scrubbed with a rash and used hydrogen peroxide it is difficult to tell if this was once vesicular due to the significant reaction action in the area we will treat as shingles - valACYclovir (VALTREX) 1000 MG tablet; Take 1 tablet (1,000 mg total) by mouth 3 (three) times daily for 7 days.  Dispense: 21 tablet; Refill: 0  4. Impetigo Area around his umbilicus appears as such recommending topical Bactroban - mupirocin ointment (BACTROBAN) 2 %; Apply 1 Application topically 2 (two) times daily. Use topically on belly  Dispense: 22 g; Refill: 0   Return if symptoms worsen or fail to improve.      I, Alfredia Ferguson, PA-C have reviewed all documentation for this visit. The documentation on  11/22/22   for the exam, diagnosis, procedures, and orders are all accurate and complete.    Alfredia Ferguson, PA-C  [facility] at [facility] [phone] (phone) [phone] (fax)  [facility]

## 2022-12-12 LAB — HEMOGLOBIN A1C: Hemoglobin A1C: 5.6

## 2022-12-12 LAB — PSA: PSA: 0.31

## 2022-12-19 DIAGNOSIS — N281 Cyst of kidney, acquired: Secondary | ICD-10-CM | POA: Diagnosis not present

## 2022-12-19 DIAGNOSIS — R3129 Other microscopic hematuria: Secondary | ICD-10-CM | POA: Diagnosis not present

## 2022-12-19 DIAGNOSIS — C61 Malignant neoplasm of prostate: Secondary | ICD-10-CM | POA: Diagnosis not present

## 2023-01-04 DIAGNOSIS — L570 Actinic keratosis: Secondary | ICD-10-CM | POA: Diagnosis not present

## 2023-01-04 DIAGNOSIS — L2089 Other atopic dermatitis: Secondary | ICD-10-CM | POA: Diagnosis not present

## 2023-01-05 ENCOUNTER — Other Ambulatory Visit: Payer: Self-pay | Admitting: Family Medicine

## 2023-01-05 DIAGNOSIS — I10 Essential (primary) hypertension: Secondary | ICD-10-CM

## 2023-02-12 DIAGNOSIS — I129 Hypertensive chronic kidney disease with stage 1 through stage 4 chronic kidney disease, or unspecified chronic kidney disease: Secondary | ICD-10-CM | POA: Diagnosis not present

## 2023-02-12 DIAGNOSIS — C61 Malignant neoplasm of prostate: Secondary | ICD-10-CM | POA: Diagnosis not present

## 2023-02-12 DIAGNOSIS — R3129 Other microscopic hematuria: Secondary | ICD-10-CM | POA: Diagnosis not present

## 2023-02-12 DIAGNOSIS — E1122 Type 2 diabetes mellitus with diabetic chronic kidney disease: Secondary | ICD-10-CM | POA: Diagnosis not present

## 2023-02-12 DIAGNOSIS — N1832 Chronic kidney disease, stage 3b: Secondary | ICD-10-CM | POA: Diagnosis not present

## 2023-02-12 DIAGNOSIS — N2581 Secondary hyperparathyroidism of renal origin: Secondary | ICD-10-CM | POA: Diagnosis not present

## 2023-02-12 DIAGNOSIS — D631 Anemia in chronic kidney disease: Secondary | ICD-10-CM | POA: Diagnosis not present

## 2023-02-26 DIAGNOSIS — N1832 Chronic kidney disease, stage 3b: Secondary | ICD-10-CM | POA: Diagnosis not present

## 2023-03-28 ENCOUNTER — Encounter: Payer: Self-pay | Admitting: Gastroenterology

## 2023-04-25 ENCOUNTER — Ambulatory Visit (INDEPENDENT_AMBULATORY_CARE_PROVIDER_SITE_OTHER): Payer: No Typology Code available for payment source | Admitting: Gastroenterology

## 2023-04-25 ENCOUNTER — Encounter: Payer: Self-pay | Admitting: Gastroenterology

## 2023-04-25 VITALS — BP 118/78 | HR 57 | Ht 73.0 in | Wt 308.0 lb

## 2023-04-25 DIAGNOSIS — Z01818 Encounter for other preprocedural examination: Secondary | ICD-10-CM

## 2023-04-25 DIAGNOSIS — Z800 Family history of malignant neoplasm of digestive organs: Secondary | ICD-10-CM | POA: Diagnosis not present

## 2023-04-25 DIAGNOSIS — Z860100 Personal history of colon polyps, unspecified: Secondary | ICD-10-CM

## 2023-04-25 NOTE — Patient Instructions (Signed)
You have been scheduled for a colonoscopy. Please follow written instructions given to you at your visit today.   If you use inhalers (even only as needed), please bring them with you on the day of your procedure.  DO NOT TAKE 7 DAYS PRIOR TO TEST- Trulicity (dulaglutide) Ozempic, Wegovy (semaglutide) Mounjaro (tirzepatide) Bydureon Bcise (exanatide extended release)  DO NOT TAKE 1 DAY PRIOR TO YOUR TEST Rybelsus (semaglutide) Adlyxin (lixisenatide) Victoza (liraglutide) Byetta (exanatide)  _______________________________________________________  If your blood pressure at your visit was 140/90 or greater, please contact your primary care physician to follow up on this.  _______________________________________________________  If you are age 63 or older, your body mass index should be between 23-30. Your Body mass index is 40.64 kg/m. If this is out of the aforementioned range listed, please consider follow up with your Primary Care Provider.  If you are age 50 or younger, your body mass index should be between 19-25. Your Body mass index is 40.64 kg/m. If this is out of the aformentioned range listed, please consider follow up with your Primary Care Provider.   ________________________________________________________  The [place] [facility] providers would like to encourage you to use [facility] to communicate with providers for non-urgent requests or questions.  Due to long hold times on the telephone, sending your provider a message by [facility] may be a faster and more efficient way to get a response.  Please allow 48 business hours for a response.  Please remember that this is for non-urgent requests.  _______________________________________________________

## 2023-04-25 NOTE — Progress Notes (Signed)
04/25/2023 Travis Daniels [id number] 1945-06-13   HISTORY OF PRESENT ILLNESS: This is a 78 year old male who is previously a patient of Dr. Christella Hartigan.  He is here today to discuss and schedule colonoscopy.  Colonoscopy 01/2020: - Twelve 1 to 4 mm polyps in the descending colon, in the transverse colon, in the ascending colon and at the ileocecal valve, removed with a cold snare. Resected and retrieved. - Diverticulosis in the left colon. - External and internal hemorrhoids. - The examination was otherwise normal on direct and retroflexion views.  Surgical [P], colon, ascending, ileocecal valve, transverse, descending, polyps (12) - TUBULAR ADENOMA (FIVE). - NO HIGH GRADE DYSPLASIA OR CARCINOMA. - HYPERPLASTIC POLYP(S).  Repeat was recommended in 3 years.  Does have family history of colon cancer in his father.  He denies any GI complaints.  Says his bowel habits alternate some, but he thinks that that depends on what he eats.  Does not see any blood in his stool.   Past Medical History:  Diagnosis Date   Arthritis    B12 deficiency    COPD (chronic obstructive pulmonary disease) (HCC) 01/2011   FVC 47%, FEV1 49%    Cough    associated with exposure to lumber yard   Family history of breast cancer    Family history of colon cancer    Family history of pancreatic cancer    Family history of prostate cancer    Family history of renal cancer    Hyperlipidemia    Hypertension    Lower extremity edema    Prostate cancer (HCC)    Secondary pulmonary hypertension 01/2011   RV syst pressure 30-40 mm Hg on ECHO    Past Surgical History:  Procedure Laterality Date   COLONOSCOPY W/ POLYPECTOMY  12/19/07   one small polyp, sigmoid diverticulosis, lipoma rectum, agioectasia/AVM in descending colon (Dr. Newman Nip at [facility] in [place], [place])   ENDOVENOUS ABLATION SAPHENOUS VEIN W/ LASER Left 09/18/2016   endovenous laser ablation left greater saphenous vein by Josephina Gip MD     ENDOVENOUS ABLATION SAPHENOUS VEIN W/ LASER Left 10/09/2016   endovenous laser ablation left small saphenous vein by Josephina Gip MD    FINGER SURGERY Right 11   rt middle finger   PROSTATE BIOPSY     TOTAL SHOULDER ARTHROPLASTY Right 01/21/2013   Procedure: SHOULDER HEMI ARTHROPLASTY;  Surgeon: Valeria Batman, MD;  Location: [facility] OR;  Service: Orthopedics;  Laterality: Right;   TRANSTHORACIC ECHOCARDIOGRAM  01/2011   EF 55-60%, mild asymmetric LVH, elevated RV systolic pressure   VASECTOMY      reports that he has never smoked. He has never used smokeless tobacco. He reports that he does not currently use alcohol. He reports that he does not use drugs. family history includes Breast cancer in his cousin; CAD in his mother; Cancer in his cousin, father, and maternal grandfather; Cancer (age of onset: 41) in his maternal grandmother; Colon cancer in his father; Epilepsy in his son; Pancreatic cancer in his father; Skin cancer in his mother. No Known Allergies    Outpatient Encounter Medications as of 04/25/2023  Medication Sig   albuterol (PROVENTIL HFA;VENTOLIN HFA) 108 (90 BASE) MCG/ACT inhaler Inhale 2 puffs into the lungs every 6 (six) hours as needed for wheezing or shortness of breath.   chlorthalidone (HYGROTON) 25 MG tablet Take 25 mg by mouth daily.   diltiazem (CARDIZEM CD) 300 MG 24 hr capsule Take 1 capsule (300 mg total)  by mouth daily.   GARLIC PO Take 1 capsule by mouth daily.   KRILL OIL PO Take 1 capsule by mouth daily.   losartan (COZAAR) 100 MG tablet TAKE 1 TABLET BY MOUTH EVERY DAY   rosuvastatin (CRESTOR) 20 MG tablet Take 20 mg by mouth daily.   tiotropium (SPIRIVA) 18 MCG inhalation capsule Place 18 mcg into inhaler and inhale daily.   allopurinol (ZYLOPRIM) 300 MG tablet TAKE 1 TABLET BY MOUTH EVERY DAY. Pt needs office visit for further refills (Patient not taking: Reported on 04/25/2023)   aspirin EC 81 MG tablet Take 81 mg by mouth daily. (Patient not taking:  Reported on 07/20/2022)   glucose blood (ACCU-CHEK GUIDE) test strip Check blood sugars once daily (Patient not taking: Reported on 04/25/2023)   LORazepam (ATIVAN) 1 MG tablet Take 1 tablet (1 mg total) by mouth as needed for anxiety (take one tablet 30 minutes prior to MRI and may repeat once, just prior to scan if needed.). (Patient not taking: Reported on 04/25/2023)   ONETOUCH DELICA LANCETS FINE MISC Check blood sugar daily (Patient not taking: Reported on 01/27/2020)   trimethoprim-polymyxin b (POLYTRIM) ophthalmic solution Place 1 drop into the left eye every 4 (four) hours. (Patient not taking: Reported on 04/25/2023)   [DISCONTINUED] mupirocin ointment (BACTROBAN) 2 % Apply 1 Application topically 2 (two) times daily. Use topically on belly   Facility-Administered Encounter Medications as of 04/25/2023  Medication   0.9 %  sodium chloride infusion    REVIEW OF SYSTEMS  : All other systems reviewed and negative except where noted in the History of Present Illness.   PHYSICAL EXAM: BP 118/78 (BP Location: Left Arm, Patient Position: Sitting, Cuff Size: Normal)   Pulse (!) 57   Ht 6\' 1"  (1.854 m)   Wt (!) 308 lb (139.7 kg)   SpO2 96%   BMI 40.64 kg/m  General: Well developed white male in no acute distress Head: Normocephalic and atraumatic Eyes:  Sclerae anicteric, conjunctiva pink. Ears: Normal auditory acuity Lungs: Clear throughout to auscultation; no W/R/R. Heart: Regular rate and rhythm; no M/R/G. Rectal: Will be done at the time of colonoscopy. Musculoskeletal: Symmetrical with no gross deformities  Skin: No lesions on visible extremities Neurological: Alert oriented x 4, grossly non-focal Psychological:  Alert and cooperative. Normal mood and affect  ASSESSMENT AND PLAN: *Personal history of colon polyps and family history of colon cancer in his father: He had 12 polyps removed on colonoscopy November 2021.  5 of those were tubular adenomas, the other hyperplastic.   Repeat recommended in 3 years.  Will schedule with Dr. Tomasa Rand.  The risks, benefits, and alternatives to colonoscopy were discussed with the patient and he consents to proceed.   CC:  Donato Schultz, *

## 2023-04-25 NOTE — Progress Notes (Signed)
Agree with the assessment and plan as outlined by Doug Sou, PA-C.  Caisley Baxendale E. Tomasa Rand, MD  [facility]

## 2023-04-30 ENCOUNTER — Ambulatory Visit: Payer: No Typology Code available for payment source | Admitting: Gastroenterology

## 2023-04-30 ENCOUNTER — Encounter: Payer: Self-pay | Admitting: Gastroenterology

## 2023-04-30 VITALS — BP 107/56 | HR 55 | Temp 98.1°F | Resp 17 | Ht 73.0 in | Wt 308.0 lb

## 2023-04-30 DIAGNOSIS — D128 Benign neoplasm of rectum: Secondary | ICD-10-CM | POA: Diagnosis not present

## 2023-04-30 DIAGNOSIS — D123 Benign neoplasm of transverse colon: Secondary | ICD-10-CM | POA: Diagnosis not present

## 2023-04-30 DIAGNOSIS — K5730 Diverticulosis of large intestine without perforation or abscess without bleeding: Secondary | ICD-10-CM | POA: Diagnosis not present

## 2023-04-30 DIAGNOSIS — D122 Benign neoplasm of ascending colon: Secondary | ICD-10-CM | POA: Diagnosis not present

## 2023-04-30 DIAGNOSIS — Z800 Family history of malignant neoplasm of digestive organs: Secondary | ICD-10-CM | POA: Diagnosis not present

## 2023-04-30 DIAGNOSIS — K5140 Inflammatory polyps of colon without complications: Secondary | ICD-10-CM

## 2023-04-30 DIAGNOSIS — D124 Benign neoplasm of descending colon: Secondary | ICD-10-CM

## 2023-04-30 DIAGNOSIS — Z1211 Encounter for screening for malignant neoplasm of colon: Secondary | ICD-10-CM | POA: Diagnosis present

## 2023-04-30 DIAGNOSIS — K635 Polyp of colon: Secondary | ICD-10-CM | POA: Diagnosis not present

## 2023-04-30 DIAGNOSIS — Z860101 Personal history of adenomatous and serrated colon polyps: Secondary | ICD-10-CM | POA: Diagnosis not present

## 2023-04-30 DIAGNOSIS — Z860100 Personal history of colon polyps, unspecified: Secondary | ICD-10-CM

## 2023-04-30 MED ORDER — SODIUM CHLORIDE 0.9 % IV SOLN: 500.0000 mL | INTRAVENOUS | Status: DC

## 2023-04-30 NOTE — Progress Notes (Signed)
 A/o x 3, VSS, gd SR's, pleased with anesthesia, report to RN

## 2023-04-30 NOTE — Progress Notes (Signed)
History and Physical Interval Note:  04/30/2023 1:14 PM  Travis Daniels  has presented today for endoscopic procedure(s), with the diagnosis of  Encounter Diagnoses  Name Primary?   History of colonic polyps Yes   Family history of colon cancer   .  The various methods of evaluation and treatment have been discussed with the patient and/or family. After consideration of risks, benefits and other options for treatment, the patient has consented to  the endoscopic procedure(s).   The patient's history has been reviewed, patient examined, no change in status, stable for endoscopic procedure(s).  I have reviewed the patient's chart and labs.  Questions were answered to the patient's satisfaction.     Edwardine Deschepper E. Tomasa Rand, MD [facility]

## 2023-04-30 NOTE — Progress Notes (Signed)
 Pt's states no medical or surgical changes since previsit or office visit.

## 2023-04-30 NOTE — Patient Instructions (Signed)
-  Handout on polyps provided -await pathology results -repeat colonoscopy for surveillance recommended. Date to be determined when pathology result become available   -Continue present medications    YOU HAD AN ENDOSCOPIC PROCEDURE TODAY AT THE [facility]:   Refer to the procedure report that was given to you for any specific questions about what was found during the examination.  If the procedure report does not answer your questions, please call your gastroenterologist to clarify.  If you requested that your care partner not be given the details of your procedure findings, then the procedure report has been included in a sealed envelope for you to review at your convenience later.  YOU SHOULD EXPECT: Some feelings of bloating in the abdomen. Passage of more gas than usual.  Walking can help get rid of the air that was put into your GI tract during the procedure and reduce the bloating. If you had a lower endoscopy (such as a colonoscopy or flexible sigmoidoscopy) you may notice spotting of blood in your stool or on the toilet paper. If you underwent a bowel prep for your procedure, you may not have a normal bowel movement for a few days.  Please Note:  You might notice some irritation and congestion in your nose or some drainage.  This is from the oxygen used during your procedure.  There is no need for concern and it should clear up in a day or so.  SYMPTOMS TO REPORT IMMEDIATELY:  Following lower endoscopy (colonoscopy or flexible sigmoidoscopy):  Excessive amounts of blood in the stool  Significant tenderness or worsening of abdominal pains  Swelling of the abdomen that is new, acute  Fever of 100F or higher   For urgent or emergent issues, a gastroenterologist can be reached at any hour by calling [phone]. Do not use MyChart messaging for urgent concerns.    DIET:  We do recommend a small meal at first, but then you may proceed to your regular diet.  Drink plenty of  fluids but you should avoid alcoholic beverages for 24 hours.  ACTIVITY:  You should plan to take it easy for the rest of today and you should NOT DRIVE or use heavy machinery until tomorrow (because of the sedation medicines used during the test).    FOLLOW UP: Our staff will call the number listed on your records the next business day following your procedure.  We will call around 7:15- 8:00 am to check on you and address any questions or concerns that you may have regarding the information given to you following your procedure. If we do not reach you, we will leave a message.     If any biopsies were taken you will be contacted by phone or by letter within the next 1-3 weeks.  Please call us at [phone] if you have not heard about the biopsies in 3 weeks.    SIGNATURES/CONFIDENTIALITY: You and/or your care partner have signed paperwork which will be entered into your electronic medical record.  These signatures attest to the fact that that the information above on your After Visit Summary has been reviewed and is understood.  Full responsibility of the confidentiality of this discharge information lies with you and/or your care-partner.  

## 2023-04-30 NOTE — Op Note (Signed)
[facility] Patient Name: Nichlas Pitera Procedure Date: 04/30/2023 1:20 PM MRN: [id number] Endoscopist: Lorin Picket E. Tomasa Rand , MD, [id number] Age: 78 Referring MD:  Date of Birth: 1946-01-16 Gender: Male Account #: [id number] Procedure:                Colonoscopy Indications:              High risk colon cancer surveillance: Personal                            history of multiple (3 or more) adenomas, Last                            colonoscopy: November 2021 Medicines:                Monitored Anesthesia Care Procedure:                Pre-Anesthesia Assessment:                           - Prior to the procedure, a History and Physical                            was performed, and patient medications and                            allergies were reviewed. The patient's tolerance of                            previous anesthesia was also reviewed. The risks                            and benefits of the procedure and the sedation                            options and risks were discussed with the patient.                            All questions were answered, and informed consent                            was obtained. Prior Anticoagulants: The patient has                            taken no anticoagulant or antiplatelet agents. ASA                            Grade Assessment: III - A patient with severe                            systemic disease. After reviewing the risks and                            benefits, the patient was deemed in satisfactory  condition to undergo the procedure.                           After obtaining informed consent, the colonoscope                            was passed under direct vision. Throughout the                            procedure, the patient's blood pressure, pulse, and                            oxygen saturations were monitored continuously. The                            CF [id number] #[id number] was  introduced through the anus                            and advanced to the the cecum, identified by                            appendiceal orifice and ileocecal valve. The                            colonoscopy was performed with difficulty due to a                            redundant colon and significant looping. Successful                            completion of the procedure was aided by using                            manual pressure and withdrawing and reinserting the                            scope. The patient tolerated the procedure well.                            The quality of the bowel preparation was good. The                            ileocecal valve, appendiceal orifice, and rectum                            were photographed. The bowel preparation used was                            SUFLAVE via split dose instruction. Scope In: 1:28:50 PM Scope Out: 2:20:33 PM Scope Withdrawal Time: 0 hours 40 minutes 6 seconds  Total Procedure Duration: 0 hours 51 minutes 43 seconds  Findings:                 The perianal and digital rectal examinations were  normal. Pertinent negatives include normal                            sphincter tone and no palpable rectal lesions.                           Three sessile polyps were found in the ascending                            colon. The polyps were 2 to 5 mm in size. These                            polyps were removed with a cold snare. Resection                            and retrieval were complete. Estimated blood loss                            was minimal.                           Two sessile polyps were found in the ascending                            colon. The polyps were 2 to 3 mm in size. These                            polyps were removed with a cold biopsy forceps.                            Resection and retrieval were complete. Estimated                            blood loss was minimal.                            Eight pedunculated and sessile polyps were found in                            the transverse colon. The polyps were 2 to 7 mm in                            size. These polyps were removed with a cold snare.                            Resection and retrieval were complete. Estimated                            blood loss was minimal.                           A 4 mm polyp was found in the descending colon. The  polyp was sessile. The polyp was removed with a                            cold snare. Resection and retrieval were complete.                            Estimated blood loss was minimal.                           A 4 mm polyp was found in the rectum. The polyp was                            sessile. The polyp was removed with a cold snare.                            Resection and retrieval were complete. Estimated                            blood loss was minimal.                           Multiple medium-mouthed and small-mouthed                            diverticula were found in the sigmoid colon,                            descending colon and transverse colon. There was no                            evidence of diverticular bleeding.                           The exam was otherwise normal throughout the                            examined colon.                           The retroflexed view of the distal rectum and anal                            verge was normal and showed no anal or rectal                            abnormalities. Complications:            No immediate complications. Estimated Blood Loss:     Estimated blood loss was minimal. Impression:               - Three 2 to 5 mm polyps in the ascending colon,                            removed with a cold snare. Resected and retrieved.                           -  Two 2 to 3 mm polyps in the ascending colon,                            removed with a cold biopsy forceps.  Resected and                            retrieved.                           - Eight 2 to 7 mm polyps in the transverse colon,                            removed with a cold snare. Resected and retrieved.                           - One 4 mm polyp in the descending colon, removed                            with a cold snare. Resected and retrieved.                           - One 4 mm polyp in the rectum, removed with a cold                            snare. Resected and retrieved.                           - Moderate diverticulosis in the sigmoid colon, in                            the descending colon and in the transverse colon.                            There was no evidence of diverticular bleeding.                           - The distal rectum and anal verge are normal on                            retroflexion view. Recommendation:           - Patient has a contact number available for                            emergencies. The signs and symptoms of potential                            delayed complications were discussed with the                            patient. Return to normal activities tomorrow.                            Written  discharge instructions were provided to the                            patient.                           - Resume previous diet.                           - Continue present medications.                           - Await pathology results.                           - Repeat colonoscopy (date not yet determined) for                            surveillance based on pathology results. Kasey Ewings E. Tomasa Rand, MD 04/30/2023 2:36:07 PM This report has been signed electronically.

## 2023-05-01 ENCOUNTER — Telehealth: Payer: Self-pay

## 2023-05-01 NOTE — Telephone Encounter (Signed)
  Follow up Call-     04/30/2023    1:08 PM  Call back number  Post procedure Call Back phone  # [phone]  Permission to leave phone message Yes     Patient questions:  Do you have a fever, pain , or abdominal swelling? No. Pain Score  0 *  Have you tolerated food without any problems? Yes.    Have you been able to return to your normal activities? Yes.    Do you have any questions about your discharge instructions: Diet   No. Medications  No. Follow up visit  No.  Do you have questions or concerns about your Care? No.  Actions: * If pain score is 4 or above: No action needed, pain <4.

## 2023-05-04 LAB — SURGICAL PATHOLOGY

## 2023-05-12 ENCOUNTER — Encounter: Payer: Self-pay | Admitting: Gastroenterology

## 2023-05-12 NOTE — Progress Notes (Signed)
 Travis Daniels,  The 15 polyps removed from your colon were a mixture of precancerous (tubular adenomas, sessile serrated polyps) and non-precancerous polyps (inflammatory, hyperplastic polyps). Although it is not clear exactly how many were precancerous, it is probable that at least 10 were precancerous.  Current guidelines recommend repeating a colonoscopy in 1 year in this scenario.  I would recommend you repeat a colonoscopy in 1 year. Additionally, I would recommend your children and siblings undergo colon cancer screening if they have not already.

## 2023-06-06 LAB — CBC AND DIFFERENTIAL
HCT: 44 (ref 41–53)
Hemoglobin: 14.8 (ref 13.5–17.5)
Platelets: 206 10*3/uL (ref 150–400)
WBC: 9.3

## 2023-06-06 LAB — HEMOGLOBIN A1C: Hemoglobin A1C: 5.6

## 2023-06-06 LAB — BASIC METABOLIC PANEL WITH GFR
BUN: 17 (ref 4–21)
CO2: 35 — AB (ref 13–22)
Chloride: 102 (ref 99–108)
Creatinine: 1.5 — AB (ref 0.6–1.3)
Glucose: 104
Potassium: 3.2 meq/L — AB (ref 3.5–5.1)
Sodium: 141 (ref 137–147)

## 2023-06-06 LAB — COMPREHENSIVE METABOLIC PANEL WITH GFR
Albumin: 4.4 (ref 3.5–5.0)
Calcium: 10.8 — AB (ref 8.7–10.7)
eGFR: 46

## 2023-06-06 LAB — CBC: RBC: 5.26 — AB (ref 3.87–5.11)

## 2023-07-04 ENCOUNTER — Other Ambulatory Visit: Payer: Self-pay | Admitting: Family Medicine

## 2023-07-04 DIAGNOSIS — I10 Essential (primary) hypertension: Secondary | ICD-10-CM

## 2023-07-05 ENCOUNTER — Ambulatory Visit (HOSPITAL_BASED_OUTPATIENT_CLINIC_OR_DEPARTMENT_OTHER)
Admission: RE | Admit: 2023-07-05 | Discharge: 2023-07-05 | Disposition: A | Discharge status: Home / Self Care | Source: Ambulatory Visit | Attending: Family Medicine | Admitting: Family Medicine

## 2023-07-05 ENCOUNTER — Ambulatory Visit: Admitting: Family Medicine

## 2023-07-05 ENCOUNTER — Ambulatory Visit: Payer: Self-pay | Admitting: *Deleted

## 2023-07-05 ENCOUNTER — Encounter: Payer: Self-pay | Admitting: Family Medicine

## 2023-07-05 VITALS — BP 112/80 | HR 83 | Temp 98.9°F | Resp 20 | Ht 73.0 in | Wt 307.6 lb

## 2023-07-05 DIAGNOSIS — M8580 Other specified disorders of bone density and structure, unspecified site: Secondary | ICD-10-CM | POA: Diagnosis not present

## 2023-07-05 DIAGNOSIS — E1151 Type 2 diabetes mellitus with diabetic peripheral angiopathy without gangrene: Secondary | ICD-10-CM

## 2023-07-05 DIAGNOSIS — M7989 Other specified soft tissue disorders: Secondary | ICD-10-CM | POA: Diagnosis not present

## 2023-07-05 DIAGNOSIS — M79602 Pain in left arm: Secondary | ICD-10-CM | POA: Diagnosis not present

## 2023-07-05 DIAGNOSIS — L03114 Cellulitis of left upper limb: Secondary | ICD-10-CM | POA: Diagnosis not present

## 2023-07-05 DIAGNOSIS — M25522 Pain in left elbow: Secondary | ICD-10-CM | POA: Diagnosis not present

## 2023-07-05 MED ORDER — CEFTRIAXONE SODIUM 1 G IJ SOLR
1.0000 g | Freq: Once | INTRAMUSCULAR | Status: AC
Start: 2023-07-05 — End: 2023-07-05
  Administered 2023-07-05: 1 g via INTRAMUSCULAR

## 2023-07-05 MED ORDER — ACCU-CHEK GUIDE TEST VI STRP: ORAL_STRIP | 12 refills | Status: DC

## 2023-07-05 MED ORDER — DOXYCYCLINE HYCLATE 100 MG PO TABS: 100.0000 mg | ORAL_TABLET | Freq: Two times a day (BID) | ORAL | 0 refills | Status: DC

## 2023-07-05 NOTE — Telephone Encounter (Signed)
  Chief Complaint: swelling elbow to fingertips Symptoms: swelling,pain- unable to use arm/hand Frequency: started Monday Pertinent Negatives: Patient denies work/activity to cause symptoms Disposition: [] ED /[] Urgent Care (no appt availability in office) / [x] Appointment(In office/virtual)/ []  [facility]/ [] Home Care/ [] Refused Recommended Disposition /[] [facility]/ []  Follow-up with PCP Additional Notes: Appointment scheduled   Copied from CRM [id number]. Topic: Clinical - Red Word Triage >> Jul 05, 2023  8:33 AM Freya Jesus wrote: Red Word that prompted transfer to Nurse Triage: Patient called said he's in pain, started at the elbow and moved down to his wrist and it's swollen. Reason for Disposition  SEVERE joint swelling (e.g., can barely bend or move elbow joint)  Answer Assessment - Initial Assessment Questions 1. LOCATION: "Where is the swelling?" (e.g., left, right, both elbows)     Swelling in elbow- down to wrist 2. SIZE and DESCRIPTION: "What does the swelling look like?" (e.g., entire elbow, localized)     Swelling from elbow to wrist/fingers 3. ONSET: "When did the swelling start?" "Does it come and go, or is it there all the time?"     Monday 4. SETTING: "Has there been any recent work, exercise or other activity that involved that part of the body?"      no 5. AGGRAVATING FACTORS: "What makes the elbow swelling worse?" (e.g., work, sports activities)     na 6. ASSOCIATED SYMPTOMS: "Is there any pain or redness?"     Pain- very sore  Protocols used: Elbow Swelling-A-AH

## 2023-07-05 NOTE — Telephone Encounter (Signed)
 Routed to [facility] in error; this is not a patient in our office

## 2023-07-05 NOTE — Telephone Encounter (Signed)
Pt was seen in office today by PCP.

## 2023-07-05 NOTE — Progress Notes (Signed)
 Established Patient Office Visit  Subjective   [name], male    DOB: Jul 13, 1945  Age: 78 y.o. MRN: [id number]  Chief Complaint  Patient presents with   Arm Swelling    Left arm, sxs started Monday, no falls or injuries, some pain.     HPI Discussed the use of AI scribe software for clinical note transcription with the patient, who gave verbal consent to proceed.  History of Present Illness Travis Daniels is a 78 year old male who presents with left arm pain and swelling.  He has been experiencing pain and swelling in his left arm, which began in the elbow on Monday and has since progressed to the wrist and hand. The initial swelling was severe, and he took two Advil this morning, totaling six, to manage the pain. The swelling has decreased somewhat, but he still cannot move the arm effectively, impacting his ability to perform daily tasks such as writing, buttoning his shirt, tying his sneakers, and retrieving his wallet. No recent injury or trauma to the arm. The pain is described as sore, and it hurts when he tries to twist it or hang it down. Symptoms started on Monday morning, with no prior discomfort on Sunday.  His left knee locked up earlier this week, possibly on Monday, with a popping sound. He describes his knee condition as 'bone on bone' and has previously received cortisone shots and gel injections, which provided temporary relief. He is currently awaiting the effects of his second gel injection.  He discusses his weight management struggles, noting a recent increase in weight after previously losing some. [facility] like Ozem[facility] and Wygovy for weight loss, but his daughter is concerned about potential side effects. He acknowledges a decrease in physical activity due to knee pain and uses a scooter for longer distances. He has been trying to cut back on certain foods to manage his weight.  He also mentions a history of prostate cancer  and a recent colonoscopy that revealed ten polyps, necessitating a follow-up in a year. He has a family history of pancreatic cancer.   Patient Active Problem List   Diagnosis Date Noted   DM (diabetes mellitus) type II, controlled, with peripheral vascular disorder (HCC) 04/06/2019   Allergic dermatitis 10/14/2018   Acute atopic conjunctivitis 09/30/2018   Hyperlipidemia associated with type 2 diabetes mellitus (HCC) 09/30/2018   Fever 02/15/2017   Renal insufficiency 02/15/2017   Genetic testing 12/26/2016   Family history of breast cancer    Family history of colon cancer    Family history of pancreatic cancer    Family history of renal cancer    Family history of prostate cancer    Malignant neoplasm of prostate (HCC) 10/16/2016   Hypokalemia 09/03/2016   Loose stools 09/03/2016   Elevated PSA 09/01/2016   Hypophosphatemia 09/01/2016   H/O prostate biopsy 08/31/2016   Cellulitis of leg, right 08/31/2016   Cellulitis 08/31/2016   Gout 08/31/2016   Acute renal failure superimposed on chronic kidney disease (HCC) 08/31/2016   Hematuria 08/31/2016   Hyperlipidemia 08/31/2016   Sepsis (HCC) 08/31/2016   Varicose veins of left lower extremity with complications 05/02/2016   Tendonitis, Achilles, right 03/25/2014   Pain in joint, ankle and foot 03/23/2014   Cellulitis of left lower leg 01/20/2014   Diabetes mellitus (HCC) 07/04/2013   Severe obesity (BMI >= 40) (HCC) 07/03/2013   OSA (obstructive sleep apnea) 01/23/2013   Hypersomnolence 01/23/2013  Osteoarthritis of shoulder 01/23/2013   Obesity, Class III, BMI 40-49.9 (morbid obesity) (HCC) 01/23/2013   Acute respiratory failure (HCC) 01/21/2013   COPD (chronic obstructive pulmonary disease) (HCC) 01/21/2013   Hypertension 01/21/2013   Chest pain 10/30/2012   Dyspnea 10/30/2012   Preop cardiovascular exam 10/30/2012   Venous stasis dermatitis 09/17/2012   Essential hypertension, benign 09/10/2012   Peripheral vascular  disease (HCC) 09/10/2012   Pain in joint, shoulder region 09/10/2012   Past Medical History:  Diagnosis Date   Arthritis    B12 deficiency    COPD (chronic obstructive pulmonary disease) (HCC) 01/2011   FVC 47%, FEV1 49%    Cough    associated with exposure to lumber yard   Family history of breast cancer    Family history of colon cancer    Family history of pancreatic cancer    Family history of prostate cancer    Family history of renal cancer    Hyperlipidemia    Hypertension    Lower extremity edema    Prostate cancer (HCC)    Secondary pulmonary hypertension 01/2011   RV syst pressure 30-40 mm Hg on ECHO    Past Surgical History:  Procedure Laterality Date   COLONOSCOPY W/ POLYPECTOMY  12/19/07   one small polyp, sigmoid diverticulosis, lipoma rectum, agioectasia/AVM in descending colon (Dr. Sydelle Evan at [facility] in [place], [place])   ENDOVENOUS ABLATION SAPHENOUS VEIN W/ LASER Left 09/18/2016   endovenous laser ablation left greater saphenous vein by Merced Stair MD    ENDOVENOUS ABLATION SAPHENOUS VEIN W/ LASER Left 10/09/2016   endovenous laser ablation left small saphenous vein by Merced Stair MD    FINGER SURGERY Right 11   rt middle finger   PROSTATE BIOPSY     TOTAL SHOULDER ARTHROPLASTY Right 01/21/2013   Procedure: SHOULDER HEMI ARTHROPLASTY;  Surgeon: Shirlee Dotter, MD;  Location: [facility] OR;  Service: Orthopedics;  Laterality: Right;   TRANSTHORACIC ECHOCARDIOGRAM  01/2011   EF 55-60%, mild asymmetric LVH, elevated RV systolic pressure   VASECTOMY     Social History   Tobacco Use   Smoking status: Never   Smokeless tobacco: Never   Tobacco comments:    occ alcohol  Vaping Use   Vaping status: Never Used  Substance Use Topics   Alcohol use: Not Currently    Comment: 2-3 drinks occasionally   Drug use: No   Social History   Socioeconomic History   Marital status: Married    Spouse name: Not on file   Number of children: 4   Years of  education: Not on file   Highest education level: Not on file  Occupational History   Occupation: retired  Tobacco Use   Smoking status: Never   Smokeless tobacco: Never   Tobacco comments:    occ alcohol  Vaping Use   Vaping status: Never Used  Substance and Sexual Activity   Alcohol use: Not Currently    Comment: 2-3 drinks occasionally   Drug use: No   Sexual activity: Yes  Other Topics Concern   Not on file  Social History Narrative   Mr. Stapel lives with his wife in [place]. He is recently retired and relocated to [facility] from [place]. Historically, his medical provider has been [facility] in [place].   Social Drivers of Corporate investment banker Strain: Not on file  Food Insecurity: Not on file  Transportation Needs: Not on file  Physical Activity: Not on file  Stress:  Not on file  Social Connections: Not on file  Intimate Partner Violence: Not on file   Family Status  Relation Name Status   Mother  Deceased at age 42       found dead in home   Father  Deceased at age 61   Daughter  Alive   Son  Alive   Son  Alive   Son  Alive   MGM  Deceased   MGF  Deceased   Cousin Public affairs consultant Deceased   Mat Aunt Doris Alive   Mat Uncle [place] Alive   Pat Aunt [place] Deceased   Pat Kathrine [place] Deceased   PGM  Deceased   PGF  Deceased   Mat Aunt Edna Deceased   Mat Uncle x3 Deceased   Cousin Marie Deceased   Neg Hx  (Not Specified)  No partnership data on file   Family History  Problem Relation Age of Onset   CAD Mother        MI in her 54s   Skin cancer Mother        multiple skin cancers removed from face and head   Cancer Father        colon and pancreas   Colon cancer Father    Pancreatic cancer Father    Epilepsy Son    Cancer Maternal Grandmother 20       "stomach"   Cancer Maternal Grandfather        prostate; died at the age of 43   Cancer Cousin        paternal first cousin kidney dx under 60   Breast cancer Cousin        maternal first cousin with breast  cancer over 26   Esophageal cancer Neg Hx    No Known Allergies    Review of Systems  Constitutional:  Negative for fever and malaise/fatigue.  HENT:  Negative for congestion.   Eyes:  Negative for blurred vision.  Respiratory:  Negative for cough and shortness of breath.   Cardiovascular:  Negative for chest pain, palpitations and leg swelling.  Gastrointestinal:  Negative for vomiting.  Musculoskeletal:  Positive for joint pain. Negative for back pain.       Swelling elbow and hand  Skin:  Negative for rash.  Neurological:  Negative for loss of consciousness and headaches.      Objective:     BP 112/80 (BP Location: Right Arm, Patient Position: Sitting, Cuff Size: Large)   Pulse 83   Temp 98.9 F (37.2 C) (Oral)   Resp 20   Ht 6\' 1"  (1.854 m)   Wt (!) 307 lb 9.6 oz (139.5 kg)   SpO2 95%   BMI 40.58 kg/m  BP Readings from Last 3 Encounters:  07/05/23 112/80  04/30/23 (!) 107/56  04/25/23 118/78   Wt Readings from Last 3 Encounters:  07/05/23 (!) 307 lb 9.6 oz (139.5 kg)  04/30/23 (!) 308 lb (139.7 kg)  04/25/23 (!) 308 lb (139.7 kg)   SpO2 Readings from Last 3 Encounters:  07/05/23 95%  04/30/23 92%  04/25/23 96%    Physical Exam Vitals and nursing note reviewed.  Constitutional:      General: He is not in acute distress.    Appearance: Normal appearance. He is well-developed.  HENT:     Head: Normocephalic and atraumatic.  Eyes:     General: No scleral icterus.       Right eye: No discharge.  Left eye: No discharge.  Cardiovascular:     Rate and Rhythm: Normal rate and regular rhythm.     Heart sounds: No murmur heard. Pulmonary:     Effort: Pulmonary effort is normal. No respiratory distress.     Breath sounds: Normal breath sounds.  Musculoskeletal:        General: Swelling and tenderness present. Normal range of motion.       Arms:     Cervical back: Normal range of motion and neck supple.     Right lower leg: No edema.     Left lower  leg: No edema.     Comments: Redness L arm hand to just above elbow Tenderness with palpation  Warm/ hot to touch  Skin:    General: Skin is warm and dry.  Neurological:     Mental Status: He is alert and oriented to person, place, and time.  Psychiatric:        Mood and Affect: Mood normal.        Behavior: Behavior normal.        Thought Content: Thought content normal.        Judgment: Judgment normal.     No results found for any visits on 07/05/23.  Last CBC Lab Results  Component Value Date   WBC 6.2 06/27/2017   HGB 15.0 06/27/2017   HCT 44.8 06/27/2017   MCV 87.6 06/27/2017   MCH 28.3 02/19/2017   RDW 15.0 06/27/2017   PLT 203.0 06/27/2017   Last metabolic panel Lab Results  Component Value Date   GLUCOSE 107 (H) 07/20/2022   NA 142 07/20/2022   K 3.5 07/20/2022   CL 100 07/20/2022   CO2 34 (H) 07/20/2022   BUN 22 07/20/2022   CREATININE 1.50 07/20/2022   GFR 44.71 (L) 07/20/2022   CALCIUM 9.7 07/20/2022   PHOS 2.9 09/11/2016   PROT 6.5 01/19/2022   ALBUMIN  4.1 01/19/2022   BILITOT 0.7 01/19/2022   ALKPHOS 78 01/19/2022   AST 14 01/19/2022   ALT 16 01/19/2022   ANIONGAP 7 02/19/2017   Last lipids Lab Results  Component Value Date   CHOL 102 01/19/2022   HDL 55.30 01/19/2022   LDLCALC 31 01/19/2022   LDLDIRECT 59.0 03/01/2017   TRIG 78.0 01/19/2022   CHOLHDL 2 01/19/2022   Last hemoglobin A1c Lab Results  Component Value Date   HGBA1C 5.4 01/19/2022   Last thyroid  functions Lab Results  Component Value Date   TSH 0.736 09/01/2016   Last vitamin D No results found for: "25OHVITD2", "25OHVITD3", "VD25OH" Last vitamin B12 and Folate No results found for: "VITAMINB12", "FOLATE"    The ASCVD Risk score (Arnett DK, et al., 2019) failed to calculate for the following reasons:   The valid total cholesterol range is 130 to 320 mg/dL    Assessment & Plan:   Problem List Items Addressed This Visit       Unprioritized   Cellulitis -  Primary   Relevant Medications   doxycycline  (VIBRA -TABS) 100 MG tablet   DM (diabetes mellitus) type II, controlled, with peripheral vascular disorder (HCC)   Relevant Medications   glucose blood (ACCU-CHEK GUIDE TEST) test strip   Other Visit Diagnoses       Pain in left arm       Relevant Orders   US  Venous Img Upper Uni Left (DVT) (Completed)   DG Elbow Complete Left (Completed)     Assessment and Plan Assessment & Plan Cellulitis of left  arm   Acute cellulitis is present in the left arm, extending from the elbow to the wrist and hand, with concerns about possible DVT. There is no history of trauma. Swelling and pain are noted, with some improvement after Advil. There is a risk of progression to an emergency situation if untreated. Prescribe doxycycline  and administer a shot of Rosadan to jumpstart treatment. Order an ultrasound to rule out DVT and an x-ray of the elbow.  Osteoarthritis of left knee   Chronic osteoarthritis in the left knee involves bone-on-bone contact, with limited relief from cortisone and gel injections. He experiences difficulty with mobility and stairs due to knee pain. Weight loss is necessary for potential knee surgery. He uses a scooter for mobility and is advised to consider aquatic exercises for low-impact activity. Continue [facility] as per [facility] schedule and encourage weight loss to facilitate potential knee surgery.  Obesity   Obesity management is discussed. He has concerns about side effects and long-term use of medications like Ozempic and Wegovy offered by the [place]. Weight loss is necessary for knee surgery. He has previously lost weight through dietary changes but is less active due to knee pain and uses a scooter for mobility. Discuss the benefits of weight loss medications for diabetes and weight loss, but emphasize dietary modifications as the healthiest approach. Encourage dietary modifications to aid weight loss and discuss potential use of weight  loss medications if needed for knee surgery.  Prostate cancer   Prostate cancer is noted with no current issues related to it.    Return in about 1 week (around 07/12/2023), or if symptoms worsen or fail to improve.    Travis Pember R Lowne Chase, DO

## 2023-07-05 NOTE — Patient Instructions (Signed)

## 2023-07-23 ENCOUNTER — Ambulatory Visit: Payer: Self-pay

## 2023-07-23 ENCOUNTER — Ambulatory Visit: Admitting: Family Medicine

## 2023-07-23 ENCOUNTER — Encounter: Payer: Self-pay | Admitting: Family Medicine

## 2023-07-23 VITALS — BP 122/78 | HR 69 | Temp 98.3°F | Resp 18 | Ht 73.0 in | Wt 310.2 lb

## 2023-07-23 DIAGNOSIS — R21 Rash and other nonspecific skin eruption: Secondary | ICD-10-CM | POA: Insufficient documentation

## 2023-07-23 DIAGNOSIS — Z794 Long term (current) use of insulin: Secondary | ICD-10-CM | POA: Diagnosis not present

## 2023-07-23 DIAGNOSIS — E1151 Type 2 diabetes mellitus with diabetic peripheral angiopathy without gangrene: Secondary | ICD-10-CM | POA: Diagnosis not present

## 2023-07-23 MED ORDER — GLUCOSE BLOOD VI STRP
ORAL_STRIP | 12 refills | Status: DC
Start: 2023-07-23 — End: 2023-11-05

## 2023-07-23 MED ORDER — INSULIN ASPART 100 UNIT/ML IJ SOLN: INTRAMUSCULAR | 99 refills | Status: DC

## 2023-07-23 MED ORDER — PREDNISONE 10 MG PO TABS: ORAL_TABLET | ORAL | 0 refills | Status: DC

## 2023-07-23 MED ORDER — METHYLPREDNISOLONE ACETATE 80 MG/ML IJ SUSP
80.0000 mg | Freq: Once | INTRAMUSCULAR | Status: AC
  Administered 2023-07-23: 80 mg via INTRAMUSCULAR

## 2023-07-23 NOTE — Assessment & Plan Note (Signed)
?   Etiology--  maybe from doxycycline

## 2023-07-23 NOTE — Telephone Encounter (Signed)
 Copied from CRM [id number]. Topic: Clinical - Red Word Triage >> Jul 23, 2023  8:53 AM Alethia Huxley E wrote: [facility] that prompted transfer to Nurse Triage: Rash. Patient stated he was diagnosed with cellulitis on his left hand and the end of April and he is now developing a rash all over his body (belly, chest, left arm, and neck) and it is itchy and pink. Rash started about 4 days ago.  Chief Complaint: Rash Symptoms: Pink and small bumps, mild/moderate itching, left arm is still sore from cellulitis Frequency: 4 days Pertinent Negatives: Patient denies fever Disposition: [] ED /[] Urgent Care (no appt availability in office) / [x] Appointment(In office/virtual)/ []  [facility]/ [] Home Care/ [] Refused Recommended Disposition /[] [facility]/ []  Follow-up with PCP Additional Notes: Patient called in to report a widespread rash. Patient stated the rash is pink and comprised of small bumps. Patient stated rash is present on chest and left arm. Patient stated rash started 4 days ago. Patient denied fever, difficulty breathing, tongue and throat swelling. Advised patient to be seen within 3 days, per protocol. Scheduled same day appointment with PCP. Provided care advice and instructed patient to call back if symptoms worsen. Patient complied.   Reason for Disposition  Mild widespread rash  (Exception: Heat rash lasting 3 days or less.)  Answer Assessment - Initial Assessment Questions 1. APPEARANCE of RASH: "Describe the rash." (e.g., spots, blisters, raised areas, skin peeling, scaly)     Looks like "prickly heat" 2. SIZE: "How big are the spots?" (e.g., tip of pen, eraser, coin; inches, centimeters)     "Even smaller than a size of a pin head" 3. LOCATION: "Where is the rash located?"     Started in center of chest, left arm and shoulder 4. COLOR: "What color is the rash?" (Note: It is difficult to assess rash color in people with darker-colored skin. When this situation occurs,  simply ask the caller to describe what they see.)     Light pink 5. ONSET: "When did the rash begin?"     Wednesday night or Thursday  6. FEVER: "Do you have a fever?" If Yes, ask: "What is your temperature, how was it measured, and when did it start?"     Denies 7. ITCHING: "Does the rash itch?" If Yes, ask: "How bad is the itch?" (Scale 1-10; or mild, moderate, severe)     Mild/moderate itching 8. CAUSE: "What do you think is causing the rash?"     Unknown 9. MEDICINE FACTORS: "Have you started any new medicines within the last 2 weeks?" (e.g., antibiotics)      Doxycycline  after OV on 07/05/23 10. OTHER SYMPTOMS: "Do you have any other symptoms?" (e.g., dizziness, headache, sore throat, joint pain)       Was treated for cellulitis of left hand on 07/05/23- states area is now lighter red, but still painful  Denies headache, denies swelling of throat and tongue COPD at baseline  Protocols used: Rash or Redness - [facility]

## 2023-07-23 NOTE — Progress Notes (Signed)
 Established Patient Office Visit  Subjective   [name], male    DOB: Sep 05, 1945  Age: 78 y.o. MRN: [id number]  Chief Complaint  Patient presents with   Rash    Chest, neck, arm, sxs since last wed, itching,no pain     HPI Discussed the use of AI scribe software for clinical note transcription with the patient, who gave verbal consent to proceed.  History of Present Illness Travis Daniels is a 78 year old male who presents with a widespread itchy rash.  He developed an itchy rash that began last Wednesday night, initially appearing on his chest and subsequently spreading to his arms, stomach, and back. The rash is confined to his upper body, with no involvement of the legs or groin. He denies any new soaps, detergents, creams, or medications and has not engaged in yard work, which is managed by his wife. He has been using hydrogen peroxide to alleviate the itching temporarily.  He has a history of cellulitis on his hand, previously treated with doxycycline . He is concerned that the rash might be related to the medication or a recent shot he received.  He experiences persistent soreness in his wrist, describing it as similar to a sprain.  He has not been regularly checking his blood sugars due to a lack of test strips, although he uses a OneTouch device for monitoring. He mentions that his blood sugars have been stable but acknowledges the need to obtain new test strips for accurate monitoring.  His social history includes living with his wife in a house, where she handles the yard work. He has three cats, one of which stays in his room. He uses a scooter for long distances due to knee issues and has a history of sleep apnea.   Patient Active Problem List   Diagnosis Date Noted   Rash 07/23/2023   DM (diabetes mellitus) type II, controlled, with peripheral vascular disorder (HCC) 04/06/2019   Allergic dermatitis 10/14/2018   Acute atopic conjunctivitis  09/30/2018   Hyperlipidemia associated with type 2 diabetes mellitus (HCC) 09/30/2018   Fever 02/15/2017   Renal insufficiency 02/15/2017   Genetic testing 12/26/2016   Family history of breast cancer    Family history of colon cancer    Family history of pancreatic cancer    Family history of renal cancer    Family history of prostate cancer    Malignant neoplasm of prostate (HCC) 10/16/2016   Hypokalemia 09/03/2016   Loose stools 09/03/2016   Elevated PSA 09/01/2016   Hypophosphatemia 09/01/2016   H/O prostate biopsy 08/31/2016   Cellulitis of leg, right 08/31/2016   Cellulitis 08/31/2016   Gout 08/31/2016   Acute renal failure superimposed on chronic kidney disease (HCC) 08/31/2016   Hematuria 08/31/2016   Hyperlipidemia 08/31/2016   Sepsis (HCC) 08/31/2016   Varicose veins of left lower extremity with complications 05/02/2016   Tendonitis, Achilles, right 03/25/2014   Pain in joint, ankle and foot 03/23/2014   Cellulitis of left lower leg 01/20/2014   Diabetes mellitus (HCC) 07/04/2013   Severe obesity (BMI >= 40) (HCC) 07/03/2013   OSA (obstructive sleep apnea) 01/23/2013   Hypersomnolence 01/23/2013   Osteoarthritis of shoulder 01/23/2013   Obesity, Class III, BMI 40-49.9 (morbid obesity) 01/23/2013   Acute respiratory failure (HCC) 01/21/2013   COPD (chronic obstructive pulmonary disease) (HCC) 01/21/2013   Hypertension 01/21/2013   Chest pain 10/30/2012   Dyspnea 10/30/2012   Preop cardiovascular exam 10/30/2012   Venous  stasis dermatitis 09/17/2012   Essential hypertension, benign 09/10/2012   Peripheral vascular disease (HCC) 09/10/2012   Pain in joint, shoulder region 09/10/2012   Past Medical History:  Diagnosis Date   Arthritis    B12 deficiency    COPD (chronic obstructive pulmonary disease) (HCC) 01/2011   FVC 47%, FEV1 49%    Cough    associated with exposure to lumber yard   Family history of breast cancer    Family history of colon cancer     Family history of pancreatic cancer    Family history of prostate cancer    Family history of renal cancer    Hyperlipidemia    Hypertension    Lower extremity edema    Prostate cancer (HCC)    Secondary pulmonary hypertension 01/2011   RV syst pressure 30-40 mm Hg on ECHO    Past Surgical History:  Procedure Laterality Date   COLONOSCOPY W/ POLYPECTOMY  12/19/07   one small polyp, sigmoid diverticulosis, lipoma rectum, agioectasia/AVM in descending colon (Dr. Sydelle Evan at [facility] in [place], [place])   ENDOVENOUS ABLATION SAPHENOUS VEIN W/ LASER Left 09/18/2016   endovenous laser ablation left greater saphenous vein by Merced Stair MD    ENDOVENOUS ABLATION SAPHENOUS VEIN W/ LASER Left 10/09/2016   endovenous laser ablation left small saphenous vein by Merced Stair MD    FINGER SURGERY Right 11   rt middle finger   PROSTATE BIOPSY     TOTAL SHOULDER ARTHROPLASTY Right 01/21/2013   Procedure: SHOULDER HEMI ARTHROPLASTY;  Surgeon: Shirlee Dotter, MD;  Location: [facility] OR;  Service: Orthopedics;  Laterality: Right;   TRANSTHORACIC ECHOCARDIOGRAM  01/2011   EF 55-60%, mild asymmetric LVH, elevated RV systolic pressure   VASECTOMY     Social History   Tobacco Use   Smoking status: Never   Smokeless tobacco: Never   Tobacco comments:    occ alcohol  Vaping Use   Vaping status: Never Used  Substance Use Topics   Alcohol use: Not Currently    Comment: 2-3 drinks occasionally   Drug use: No   Social History   Socioeconomic History   Marital status: Married    Spouse name: Not on file   Number of children: 4   Years of education: Not on file   Highest education level: Not on file  Occupational History   Occupation: retired  Tobacco Use   Smoking status: Never   Smokeless tobacco: Never   Tobacco comments:    occ alcohol  Vaping Use   Vaping status: Never Used  Substance and Sexual Activity   Alcohol use: Not Currently    Comment: 2-3 drinks occasionally   Drug use:  No   Sexual activity: Yes  Other Topics Concern   Not on file  Social History Narrative   Travis Daniels lives with his wife in [place]. He is recently retired and relocated to [facility] from [place]. Historically, his medical provider has been [facility] in [place].   Social Drivers of Corporate investment banker Strain: Not on file  Food Insecurity: Not on file  Transportation Needs: Not on file  Physical Activity: Not on file  Stress: Not on file  Social Connections: Not on file  Intimate Partner Violence: Not on file   Family Status  Relation Name Status   Mother  Deceased at age 57       found dead in home   Father  Deceased at age 36   Daughter  Alive   Son  Alive   Son  Alive   Son  Alive   [place]  Deceased   MGF  Deceased   Jana Mcgregor Deceased   Mat Aunt Doris Alive   Mat Uncle Hermosa Alive   Pat Aunt [place] Deceased   Pat Kathrine [place] Deceased   PGM  Deceased   PGF  Deceased   Mat Aunt Edna Deceased   Mat Uncle x3 Deceased   Cousin Marie Deceased   Neg Hx  (Not Specified)  No partnership data on file   Family History  Problem Relation Age of Onset   CAD Mother        MI in her 50s   Skin cancer Mother        multiple skin cancers removed from face and head   Cancer Father        colon and pancreas   Colon cancer Father    Pancreatic cancer Father    Epilepsy Son    Cancer Maternal Grandmother 90       "stomach"   Cancer Maternal Grandfather        prostate; died at the age of 26   Cancer Cousin        paternal first cousin kidney dx under 46   Breast cancer Cousin        maternal first cousin with breast cancer over 52   Esophageal cancer Neg Hx       ROS    Objective:     BP 122/78 (BP Location: Right Arm, Patient Position: Sitting, Cuff Size: Large)   Pulse 69   Temp 98.3 F (36.8 C) (Oral)   Resp 18   Ht 6\' 1"  (1.854 m)   Wt (!) 310 lb 3.2 oz (140.7 kg)   SpO2 96%   BMI 40.93 kg/m  BP Readings from Last 3 Encounters:  07/23/23 122/78   07/05/23 112/80  04/30/23 (!) 107/56   Wt Readings from Last 3 Encounters:  07/23/23 (!) 310 lb 3.2 oz (140.7 kg)  07/05/23 (!) 307 lb 9.6 oz (139.5 kg)  04/30/23 (!) 308 lb (139.7 kg)   SpO2 Readings from Last 3 Encounters:  07/23/23 96%  07/05/23 95%  04/30/23 92%      Physical Exam   No results found for any visits on 07/23/23.  Last CBC Lab Results  Component Value Date   WBC 6.2 06/27/2017   HGB 15.0 06/27/2017   HCT 44.8 06/27/2017   MCV 87.6 06/27/2017   MCH 28.3 02/19/2017   RDW 15.0 06/27/2017   PLT 203.0 06/27/2017   Last metabolic panel Lab Results  Component Value Date   GLUCOSE 107 (H) 07/20/2022   NA 142 07/20/2022   K 3.5 07/20/2022   CL 100 07/20/2022   CO2 34 (H) 07/20/2022   BUN 22 07/20/2022   CREATININE 1.50 07/20/2022   GFR 44.71 (L) 07/20/2022   CALCIUM 9.7 07/20/2022   PHOS 2.9 09/11/2016   PROT 6.5 01/19/2022   ALBUMIN  4.1 01/19/2022   BILITOT 0.7 01/19/2022   ALKPHOS 78 01/19/2022   AST 14 01/19/2022   ALT 16 01/19/2022   ANIONGAP 7 02/19/2017   Last lipids Lab Results  Component Value Date   CHOL 102 01/19/2022   HDL 55.30 01/19/2022   LDLCALC 31 01/19/2022   LDLDIRECT 59.0 03/01/2017   TRIG 78.0 01/19/2022   CHOLHDL 2 01/19/2022   Last hemoglobin A1c Lab Results  Component Value Date   HGBA1C 5.4 01/19/2022  Last thyroid  functions Lab Results  Component Value Date   TSH 0.736 09/01/2016   Last vitamin D No results found for: "25OHVITD2", "25OHVITD3", "VD25OH" Last vitamin B12 and Folate No results found for: "VITAMINB12", "FOLATE"    The ASCVD Risk score (Arnett DK, et al., 2019) failed to calculate for the following reasons:   The valid total cholesterol range is 130 to 320 mg/dL    Assessment & Plan:   Problem List Items Addressed This Visit       Unprioritized   DM (diabetes mellitus) type II, controlled, with peripheral vascular disorder (HCC)   Relevant Medications   glucose blood test strip    insulin  aspart (NOVOLOG ) 100 UNIT/ML injection   Rash - Primary   ? Etiology--  maybe from doxycycline        Relevant Medications   predniSONE  (DELTASONE ) 10 MG tablet   Assessment and Plan Assessment & Plan Rash   He has a rash of unknown etiology since last Wednesday, affecting the chest, arms, stomach, and back. The differential includes a viral cause or a reaction to doxycycline . No new soaps, detergents, or medications have been identified. Temporary relief is achieved with hydrogen peroxide application. Administer a steroid injection today and initiate prednisone  therapy tomorrow. Monitor blood glucose at lunchtime. Consider diphenhydramine at night if pruritus persists. Evaluate for improvement and conduct further investigation if the rash persists.  Diabetes mellitus, type 2   His type 2 diabetes mellitus is well-managed. Blood glucose monitoring is essential due to steroid treatment for the rash, which may elevate glucose levels. He has not been monitoring due to a lack of test strips. A OneTouch meter is selected for use. Monitor blood glucose four times daily during steroid therapy. Prescribe an insulin  pen for glucose levels exceeding 200 mg/dL and provide insulin  dosing instructions based on glucose levels. Distribute printed instructions for insulin  use and dosing scale. Ensure acquisition of OneTouch test strips for glucose monitoring.  Cellulitis   He had previous cellulitis in the hand, treated with doxycycline . Current wrist and knuckle soreness may relate to prior cellulitis or medication side effects. Observe for cellulitis recurrence and evaluate the doxycycline  link if the rash persists.   Return if symptoms worsen or fail to improve.    Eula Jaster R Lowne Chase, DO

## 2023-07-23 NOTE — Patient Instructions (Signed)
 Sliding scale insulin    200-250   2 u  251-300 4 u  301-350  6 u [id number] 8 u

## 2023-07-25 ENCOUNTER — Other Ambulatory Visit: Payer: Self-pay | Admitting: Family Medicine

## 2023-07-25 DIAGNOSIS — E1151 Type 2 diabetes mellitus with diabetic peripheral angiopathy without gangrene: Secondary | ICD-10-CM

## 2023-07-26 NOTE — Telephone Encounter (Signed)
 Per test claim: Medication is not eligible for pharmacy benefits and must be billed through medical insurance. As our team only handles pharmacy related prior auths, medical PA's must be submitted by the clinic. Thank you  Please sign off on rx in this encounter as PA team is unable to resolve RX requests. Thank you

## 2023-07-27 DIAGNOSIS — H40013 Open angle with borderline findings, low risk, bilateral: Secondary | ICD-10-CM | POA: Diagnosis not present

## 2023-07-27 DIAGNOSIS — H35373 Puckering of macula, bilateral: Secondary | ICD-10-CM | POA: Diagnosis not present

## 2023-07-27 DIAGNOSIS — H25813 Combined forms of age-related cataract, bilateral: Secondary | ICD-10-CM | POA: Diagnosis not present

## 2023-07-27 DIAGNOSIS — E119 Type 2 diabetes mellitus without complications: Secondary | ICD-10-CM | POA: Diagnosis not present

## 2023-07-27 DIAGNOSIS — H04123 Dry eye syndrome of bilateral lacrimal glands: Secondary | ICD-10-CM | POA: Diagnosis not present

## 2023-07-27 LAB — HM DIABETES EYE EXAM

## 2023-09-13 DIAGNOSIS — E1122 Type 2 diabetes mellitus with diabetic chronic kidney disease: Secondary | ICD-10-CM | POA: Diagnosis not present

## 2023-09-13 DIAGNOSIS — N2581 Secondary hyperparathyroidism of renal origin: Secondary | ICD-10-CM | POA: Diagnosis not present

## 2023-09-13 DIAGNOSIS — D631 Anemia in chronic kidney disease: Secondary | ICD-10-CM | POA: Diagnosis not present

## 2023-09-13 DIAGNOSIS — I129 Hypertensive chronic kidney disease with stage 1 through stage 4 chronic kidney disease, or unspecified chronic kidney disease: Secondary | ICD-10-CM | POA: Diagnosis not present

## 2023-09-13 DIAGNOSIS — R3129 Other microscopic hematuria: Secondary | ICD-10-CM | POA: Diagnosis not present

## 2023-09-13 DIAGNOSIS — C61 Malignant neoplasm of prostate: Secondary | ICD-10-CM | POA: Diagnosis not present

## 2023-09-13 DIAGNOSIS — N1832 Chronic kidney disease, stage 3b: Secondary | ICD-10-CM | POA: Diagnosis not present

## 2023-09-13 DIAGNOSIS — N189 Chronic kidney disease, unspecified: Secondary | ICD-10-CM | POA: Diagnosis not present

## 2023-09-27 DIAGNOSIS — L2089 Other atopic dermatitis: Secondary | ICD-10-CM | POA: Diagnosis not present

## 2023-09-27 DIAGNOSIS — I872 Venous insufficiency (chronic) (peripheral): Secondary | ICD-10-CM | POA: Diagnosis not present

## 2023-09-27 DIAGNOSIS — I890 Lymphedema, not elsewhere classified: Secondary | ICD-10-CM | POA: Diagnosis not present

## 2023-09-27 DIAGNOSIS — L0390 Cellulitis, unspecified: Secondary | ICD-10-CM | POA: Diagnosis not present

## 2023-10-04 DIAGNOSIS — L0390 Cellulitis, unspecified: Secondary | ICD-10-CM | POA: Diagnosis not present

## 2023-10-11 DIAGNOSIS — L0390 Cellulitis, unspecified: Secondary | ICD-10-CM | POA: Diagnosis not present

## 2023-10-18 DIAGNOSIS — R21 Rash and other nonspecific skin eruption: Secondary | ICD-10-CM | POA: Diagnosis not present

## 2023-11-02 ENCOUNTER — Ambulatory Visit

## 2023-11-05 ENCOUNTER — Ambulatory Visit (INDEPENDENT_AMBULATORY_CARE_PROVIDER_SITE_OTHER): Admitting: *Deleted

## 2023-11-05 VITALS — BP 110/60 | HR 54 | Temp 98.1°F | Resp 18 | Ht 73.0 in | Wt 312.8 lb

## 2023-11-05 DIAGNOSIS — E1151 Type 2 diabetes mellitus with diabetic peripheral angiopathy without gangrene: Secondary | ICD-10-CM

## 2023-11-05 DIAGNOSIS — Z0000 Encounter for general adult medical examination without abnormal findings: Secondary | ICD-10-CM

## 2023-11-05 MED ORDER — GLUCOSE BLOOD VI STRP: ORAL_STRIP | 6 refills | Status: AC

## 2023-11-05 NOTE — Patient Instructions (Addendum)
 Mr. Travis Daniels , Thank you for taking time out of your busy schedule to complete your Annual Wellness Visit with me. I enjoyed our conversation and look forward to speaking with you again next year. I, as well as your care team,  appreciate your ongoing commitment to your health goals. Please review the following plan we discussed and let me know if I can assist you in the future. Your Game plan/ To Do List    Follow up Visits: Next Medicare AWV with our clinical staff:   11/05/24 9am, in person  Next Office Visit with your provider: 11/09/23 9:40am, Dr Antonio Meth  Clinician Recommendations:  Aim for 30 minutes of exercise or brisk walking, 6-8 glasses of water, and 5 servings of fruits and vegetables each day.   You will need to get the following vaccines at your local pharmacy or [facility]: Flu       This is a list of the screening recommended for you and due dates:  Health Maintenance  Topic Date Due   Yearly kidney health urinalysis for diabetes  02/26/2016   Medicare Annual Wellness Visit  12/09/2019   COVID-19 Vaccine (7 - Moderna risk 2024-25 season) 06/20/2023   Flu Shot  10/12/2023   Hemoglobin A1C  12/07/2023   Colon Cancer Screening  04/29/2024   Complete foot exam   05/03/2024   Yearly kidney function blood test for diabetes  06/05/2024   Eye exam for diabetics  07/26/2024   DTaP/Tdap/Td vaccine (4 - Td or Tdap) 02/15/2031   Pneumococcal Vaccine for age over 73  Completed   Hepatitis C Screening  Completed   Zoster (Shingles) Vaccine  Completed   HPV Vaccine  Aged Out   Meningitis B Vaccine  Aged Out    Advanced directives: (Provided) Advance directive discussed with you today. I have provided a copy for you to complete at home and have notarized. Once this is complete, please bring a copy in to our office so we can scan it into your chart.  Advance Care Planning is important because it:  [x]  Makes sure you receive the medical care that is consistent with your values,  goals, and preferences  [x]  It provides guidance to your family and loved ones and reduces their decisional burden about whether or not they are making the right decisions based on your wishes.  Follow the link provided in your after visit summary or read over the paperwork we have mailed to you to help you started getting your Advance Directives in place. If you need assistance in completing these, please reach out to us  so that we can help you!  See attachments for Preventive Care and Fall Prevention Tips.

## 2023-11-05 NOTE — Progress Notes (Signed)
 Subjective:   Travis Daniels is a 78 y.o. who presents for a Medicare Wellness preventive visit.  As a reminder, Annual Wellness Visits don't include a physical exam, and some assessments may be limited, especially if this visit is performed virtually. We may recommend an in-person follow-up visit with your provider if needed.  Visit Complete: In person  Persons Participating in Visit: Patient.  AWV Questionnaire: No: Patient Medicare AWV questionnaire was not completed prior to this visit.  Cardiac Risk Factors include: advanced age (>45men, >35 women);male gender;diabetes mellitus;obesity (BMI >30kg/m2);Other (see comment), Risk factor comments: OSA, PVD, CKD, COPD, History of Prostate Cancer     Objective:    Today's Vitals   11/05/23 0903  BP: 110/60  Pulse: (!) 54  Resp: 18  Temp: 98.1 F (36.7 C)  TempSrc: Oral  SpO2: 96%  Weight: (!) 312 lb 12.8 oz (141.9 kg)  Height: 6' 1 (1.854 m)   Body mass index is 41.27 kg/m.     11/05/2023    9:26 AM 06/17/2019    9:42 AM 01/14/2019   10:03 AM 12/09/2018    1:08 PM 10/05/2018    8:30 PM 02/16/2017   12:00 AM 02/15/2017   12:23 PM  Advanced Directives  Does Patient Have a Medical Advance Directive? No No No No No No  No   Would patient like information on creating a medical advance directive? Yes (MAU/Ambulatory/Procedural Areas - Information given)   No - Patient declined No - Patient declined  No - Patient declined       Data saved with a previous flowsheet row definition    Current Medications (verified) Outpatient Encounter Medications as of 11/05/2023  Medication Sig   albuterol  (PROVENTIL  HFA;VENTOLIN  HFA) 108 (90 BASE) MCG/ACT inhaler Inhale 2 puffs into the lungs every 6 (six) hours as needed for wheezing or shortness of breath.   allopurinol  (ZYLOPRIM ) 300 MG tablet TAKE 1 TABLET BY MOUTH EVERY DAY. Pt needs office visit for further refills   chlorthalidone  (HYGROTON ) 25 MG tablet Take 25 mg by mouth daily.    Cholecalciferol 50 MCG (2000 UT) TABS Take 2,000 Units by mouth daily.   diltiazem  (CARDIZEM  CD) 300 MG 24 hr capsule Take 1 capsule (300 mg total) by mouth daily.   GARLIC PO Take 1 capsule by mouth daily.   glucose blood test strip Use twice daily as instructed   KRILL OIL PO Take 1 capsule by mouth daily.   LORazepam  (ATIVAN ) 1 MG tablet Take 1 tablet (1 mg total) by mouth as needed for anxiety (take one tablet 30 minutes prior to MRI and may repeat once, just prior to scan if needed.).   losartan  (COZAAR ) 100 MG tablet TAKE 1 TABLET BY MOUTH EVERY DAY   ONETOUCH DELICA LANCETS FINE MISC Check blood sugar daily   rosuvastatin (CRESTOR) 20 MG tablet Take 20 mg by mouth daily.   tiotropium (SPIRIVA ) 18 MCG inhalation capsule Place 18 mcg into inhaler and inhale daily.   trimethoprim -polymyxin b  (POLYTRIM ) ophthalmic solution Place 1 drop into the left eye every 4 (four) hours.   [DISCONTINUED] glucose blood test strip Use as instructed   aspirin  EC 81 MG tablet Take 81 mg by mouth daily. (Patient not taking: Reported on 07/20/2022)   [DISCONTINUED] doxycycline  (VIBRA -TABS) 100 MG tablet Take 1 tablet (100 mg total) by mouth 2 (two) times daily.   [DISCONTINUED] insulin  aspart (NOVOLOG ) 100 UNIT/ML injection Per sliding scale   max 40 u a day (Patient not  taking: Reported on 11/05/2023)   [DISCONTINUED] predniSONE  (DELTASONE ) 10 MG tablet TAKE 3 TABLETS PO QD FOR 3 DAYS THEN TAKE 2 TABLETS PO QD FOR 3 DAYS THEN TAKE 1 TABLET PO QD FOR 3 DAYS THEN TAKE 1/2 TAB PO QD FOR 3 DAYS   Facility-Administered Encounter Medications as of 11/05/2023  Medication   0.9 %  sodium chloride  infusion    Allergies (verified) Patient has no known allergies.   History: Past Medical History:  Diagnosis Date   Arthritis    B12 deficiency    COPD (chronic obstructive pulmonary disease) (HCC) 01/2011   FVC 47%, FEV1 49%    Cough    associated with exposure to lumber yard   Family history of breast cancer     Family history of colon cancer    Family history of pancreatic cancer    Family history of prostate cancer    Family history of renal cancer    Hyperlipidemia    Hypertension    Lower extremity edema    Prostate cancer (HCC)    Secondary pulmonary hypertension 01/2011   RV syst pressure 30-40 mm Hg on ECHO    Past Surgical History:  Procedure Laterality Date   COLONOSCOPY W/ POLYPECTOMY  12/19/07   one small polyp, sigmoid diverticulosis, lipoma rectum, agioectasia/AVM in descending colon (Dr. Betha at [facility] in [place], [place])   ENDOVENOUS ABLATION SAPHENOUS VEIN W/ LASER Left 09/18/2016   endovenous laser ablation left greater saphenous vein by Lynwood Collum MD    ENDOVENOUS ABLATION SAPHENOUS VEIN W/ LASER Left 10/09/2016   endovenous laser ablation left small saphenous vein by Lynwood Collum MD    FINGER SURGERY Right 11   rt middle finger   PROSTATE BIOPSY     TOTAL SHOULDER ARTHROPLASTY Right 01/21/2013   Procedure: SHOULDER HEMI ARTHROPLASTY;  Surgeon: Maude LELON Right, MD;  Location: [facility] OR;  Service: Orthopedics;  Laterality: Right;   TRANSTHORACIC ECHOCARDIOGRAM  01/2011   EF 55-60%, mild asymmetric LVH, elevated RV systolic pressure   VASECTOMY     Family History  Problem Relation Age of Onset   CAD Mother        MI in her 72s   Skin cancer Mother        multiple skin cancers removed from face and head   Cancer Father        colon and pancreas   Colon cancer Father    Pancreatic cancer Father    Epilepsy Son    Cancer Maternal Grandmother 49       stomach   Cancer Maternal Grandfather        prostate; died at the age of 66   Cancer Cousin        paternal first cousin kidney dx under 16   Breast cancer Cousin        maternal first cousin with breast cancer over 35   Esophageal cancer Neg Hx    Social History   Socioeconomic History   Marital status: Married    Spouse name: Not on file   Number of children: 4   Years of education: Not on file    Highest education level: Not on file  Occupational History   Occupation: retired  Tobacco Use   Smoking status: Never   Smokeless tobacco: Never   Tobacco comments:    occ alcohol  Vaping Use   Vaping status: Never Used  Substance and Sexual Activity   Alcohol use: Not Currently  Comment: 2-3 drinks occasionally   Drug use: No   Sexual activity: Yes  Other Topics Concern   Not on file  Social History Narrative   Mr. Lemen lives with his wife in [place]. He is recently retired and relocated to [facility] [place] [facility] from [place]. Historically, his medical provider has been [facility] in [place].   Social Drivers of Corporate investment banker Strain: Low Risk  (11/05/2023)   Overall Financial Resource Strain (CARDIA)    Difficulty of Paying Living Expenses: Not very hard  Food Insecurity: No Food Insecurity (11/05/2023)   Hunger Vital Sign    Worried About Running Out of Food in the Last Year: Never true    Ran Out of Food in the Last Year: Never true  Transportation Needs: No Transportation Needs (11/05/2023)   PRAPARE - Administrator, Civil Service (Medical): No    Lack of Transportation (Non-Medical): No  Physical Activity: Insufficiently Active (11/05/2023)   Exercise Vital Sign    Days of Exercise per Week: 3 days    Minutes of Exercise per Session: 20 min  Stress: No Stress Concern Present (11/05/2023)   [employer] of Occupational Health - Occupational Stress Questionnaire    Feeling of Stress: Not at all  Social Connections: Not on file    Tobacco Counseling Counseling given: Not Answered Tobacco comments: occ alcohol    Clinical Intake:  Pre-visit preparation completed: Yes  Pain : No/denies pain     BMI - recorded: 41.27 Nutritional Status: BMI > 30  Obese Diabetes: Yes CBG done?: No Did pt. bring in CBG monitor from home?: No  Lab Results  Component Value Date   HGBA1C 5.6 06/06/2023   HGBA1C 5.6 12/12/2022   HGBA1C 5.4 01/19/2022     How often do  you need to have someone help you when you read instructions, pamphlets, or other written materials from your doctor or pharmacy?: 1 - Never  Interpreter Needed?: No  Information entered by :: Lolita Libra, CMA   Activities of Daily Living     11/05/2023    9:18 AM  In your present state of health, do you have any difficulty performing the following activities:  Hearing? 1  Vision? 0  Difficulty concentrating or making decisions? 0  Walking or climbing stairs? 1  Dressing or bathing? 0  Doing errands, shopping? 0  Preparing Food and eating ? N  Using the Toilet? N  In the past six months, have you accidently leaked urine? N  Do you have problems with loss of bowel control? N  Managing your Medications? N  Managing your Finances? N  Housekeeping or managing your Housekeeping? N    Patient Care Team: Antonio Meth, Jamee SAUNDERS, DO as PCP - General (Family Medicine) Rayburn Pac, MD as Consulting Physician (Nephrology) Grayce Zina PEAK Nurse Navigator as Registered Nurse (Medical Oncology) Marcey Elspeth PARAS, MD as Consulting Physician (Ophthalmology)  I have updated your Care Teams any recent Medical Services you may have received from other providers in the past year.     Assessment:   This is a routine wellness examination for [place].  Hearing/Vision screen Hearing Screening - Comments:: Just got new hearing aids Vision Screening - Comments:: Wears RX glasses -- up to date with routine eye exams.    Goals Addressed               This Visit's Progress     Patient Stated (pt-stated)  To lose 20 pounds to be able to have knee replacement       Depression Screen     11/05/2023    9:22 AM 01/19/2022   11:21 AM 12/09/2018    1:20 PM 11/22/2017    3:36 PM 10/16/2016    8:47 AM 03/09/2016    2:20 PM 02/28/2016   12:59 PM  PHQ 2/9 Scores  PHQ - 2 Score 0 0 0 0 0 0 0  PHQ- 9 Score 1          Fall Risk     11/05/2023    9:14 AM 11/22/2022    3:06 PM  07/20/2022   10:12 AM 01/19/2022   11:20 AM 12/09/2018    1:20 PM  Fall Risk   Falls in the past year? 0 0 0 0 0   Number falls in past yr: 0 0 0 0   Injury with Fall? 0 0 0 0   Risk for fall due to : Impaired balance/gait No Fall Risks No Fall Risks Impaired balance/gait;Impaired mobility   Risk for fall due to: Comment has oa knees, waiting knee replacement      Follow up Education provided Falls evaluation completed Falls evaluation completed Falls evaluation completed       Data saved with a previous flowsheet row definition    MEDICARE RISK AT HOME:  Medicare Risk at Home Any stairs in or around the home?: Yes If so, are there any without handrails?: No Home free of loose throw rugs in walkways, pet beds, electrical cords, etc?: No Adequate lighting in your home to reduce risk of falls?: No Life alert?: No Use of a cane, walker or w/c?: No (has cane but hasn't needed it) Grab bars in the bathroom?: Yes Shower chair or bench in shower?: No Elevated toilet seat or a handicapped toilet?: Yes  TIMED UP AND GO:  Was the test performed?  Yes  Length of time to ambulate 10 feet: 7 sec Gait slow and steady without use of assistive device  Cognitive Function: 6CIT completed    03/09/2016    2:20 PM  MMSE - Mini Mental State Exam  Orientation to time 5   Orientation to Place 5   Registration 3   Attention/ Calculation 5   Recall 3   Language- name 2 objects 2   Language- repeat 1  Language- follow 3 step command 3   Language- read & follow direction 1   Write a sentence 1   Copy design 1   Total score 30      Data saved with a previous flowsheet row definition        11/05/2023    9:20 AM  6CIT Screen  What Year? 0 points  What month? 0 points  What time? 0 points  Count back from 20 0 points  Months in reverse 2 points  Repeat phrase 2 points  Total Score 4 points    Immunizations Immunization History  Administered Date(s) Administered    sv, Bivalent,  Protein Subunit Rsvpref,pf (Abrysvo) 01/10/2022   Fluad Quad(high Dose 65+) 12/13/2018, 01/02/2020, 12/15/2020   INFLUENZA, HIGH DOSE SEASONAL PF 12/11/2013, 12/03/2014, 02/11/2015, 11/12/2015, 12/27/2016, 11/22/2017, 12/15/2021, 12/12/2022   Influenza Split 11/11/2012, 12/11/2012   Influenza,inj,Quad PF,6+ Mos 11/21/2013   Influenza,inj,quad, With Preservative 12/11/2016   Influenza-Unspecified 04/11/2004, 03/14/2005, 01/18/2006, 12/28/2006, 11/26/2007, 12/31/2008, 02/16/2010, 01/04/2011, 12/07/2011, 01/05/2016   Moderna Covid-19 Fall Seasonal Vaccine 25yrs & older 12/20/2022   Moderna Covid-19 Vaccine Bivalent Booster 21yrs &  up 12/30/2020, 12/29/2021   Moderna Sars-Covid-2 Vaccination 04/24/2019, 05/22/2019, 01/12/2020   Pneumococcal Conjugate-13 11/12/2011, 11/29/2015   Pneumococcal Polysaccharide-23 09/30/2012, 01/02/2017   Tdap 03/13/2010, 03/25/2013, 02/14/2021   Zoster Recombinant(Shingrix) 03/26/2020, 05/24/2020   Zoster, Live 10/01/2012    Screening Tests Health Maintenance  Topic Date Due   Diabetic kidney evaluation - Urine ACR  02/26/2016   COVID-19 Vaccine (7 - Moderna risk 2024-25 season) 06/20/2023   INFLUENZA VACCINE  10/12/2023   HEMOGLOBIN A1C  12/07/2023   Colonoscopy  04/29/2024   FOOT EXAM  05/03/2024   Diabetic kidney evaluation - eGFR measurement  06/05/2024   OPHTHALMOLOGY EXAM  07/26/2024   Medicare Annual Wellness (AWV)  11/04/2024   DTaP/Tdap/Td (4 - Td or Tdap) 02/15/2031   Pneumococcal Vaccine: 50+ Years  Completed   Hepatitis C Screening  Completed   Zoster Vaccines- Shingrix  Completed   HPV VACCINES  Aged Out   Meningococcal B Vaccine  Aged Out    Health Maintenance  Health Maintenance Due  Topic Date Due   Diabetic kidney evaluation - Urine ACR  02/26/2016   COVID-19 Vaccine (7 - Moderna risk 2024-25 season) 06/20/2023   INFLUENZA VACCINE  10/12/2023   Health Maintenance Items Addressed: Will get flu vaccine at [facility] or  pharmacy.  Additional Screening:  Vision Screening: Recommended annual ophthalmology exams for early detection of glaucoma and other disorders of the eye. Would you like a referral to an eye doctor? No    Dental Screening: Recommended annual dental exams for proper oral hygiene  Community Resource Referral / Chronic Care Management: CRR required this visit?  No   CCM required this visit?  No   Plan:    I have personally reviewed and noted the following in the patient's chart:   Medical and social history Use of alcohol, tobacco or illicit drugs  Current medications and supplements including opioid prescriptions. Patient is not currently taking opioid prescriptions. Functional ability and status Nutritional status Physical activity Advanced directives List of other physicians Hospitalizations, surgeries, and ER visits in previous 12 months Vitals Screenings to include cognitive, depression, and falls Referrals and appointments  In addition, I have reviewed and discussed with patient certain preventive protocols, quality metrics, and best practice recommendations. A written personalized care plan for preventive services as well as general preventive health recommendations were provided to patient.   Lolita Libra, CMA   11/05/2023   After Visit Summary: (In Person-Printed) AVS printed and given to the patient  Notes: Nothing significant to report at this time.

## 2023-11-09 ENCOUNTER — Ambulatory Visit (INDEPENDENT_AMBULATORY_CARE_PROVIDER_SITE_OTHER): Admitting: Family Medicine

## 2023-11-09 ENCOUNTER — Encounter: Payer: Self-pay | Admitting: Family Medicine

## 2023-11-09 VITALS — BP 132/78 | HR 83 | Temp 97.9°F | Resp 18 | Ht 73.0 in | Wt 310.0 lb

## 2023-11-09 DIAGNOSIS — M1A0790 Idiopathic chronic gout, unspecified ankle and foot, without tophus (tophi): Secondary | ICD-10-CM | POA: Diagnosis not present

## 2023-11-09 DIAGNOSIS — E1169 Type 2 diabetes mellitus with other specified complication: Secondary | ICD-10-CM | POA: Diagnosis not present

## 2023-11-09 DIAGNOSIS — Z7985 Long-term (current) use of injectable non-insulin antidiabetic drugs: Secondary | ICD-10-CM

## 2023-11-09 DIAGNOSIS — C61 Malignant neoplasm of prostate: Secondary | ICD-10-CM | POA: Diagnosis not present

## 2023-11-09 DIAGNOSIS — N183 Chronic kidney disease, stage 3 unspecified: Secondary | ICD-10-CM | POA: Diagnosis not present

## 2023-11-09 DIAGNOSIS — E1122 Type 2 diabetes mellitus with diabetic chronic kidney disease: Secondary | ICD-10-CM

## 2023-11-09 DIAGNOSIS — I10 Essential (primary) hypertension: Secondary | ICD-10-CM | POA: Diagnosis not present

## 2023-11-09 DIAGNOSIS — E785 Hyperlipidemia, unspecified: Secondary | ICD-10-CM | POA: Diagnosis not present

## 2023-11-09 LAB — COMPREHENSIVE METABOLIC PANEL WITH GFR
ALT: 8 U/L (ref 0–53)
AST: 10 U/L (ref 0–37)
Albumin: 3.9 g/dL (ref 3.5–5.2)
Alkaline Phosphatase: 81 U/L (ref 39–117)
BUN: 21 mg/dL (ref 6–23)
CO2: 33 meq/L — ABNORMAL HIGH (ref 19–32)
Calcium: 9.8 mg/dL (ref 8.4–10.5)
Chloride: 101 meq/L (ref 96–112)
Creatinine, Ser: 1.64 mg/dL — ABNORMAL HIGH (ref 0.40–1.50)
GFR: 39.8 mL/min — ABNORMAL LOW (ref >60.00)
Glucose, Bld: 86 mg/dL (ref 70–99)
Potassium: 3.6 meq/L (ref 3.5–5.1)
Sodium: 144 meq/L (ref 135–145)
Total Bilirubin: 0.8 mg/dL (ref 0.2–1.2)
Total Protein: 6.4 g/dL (ref 6.0–8.3)

## 2023-11-09 LAB — CBC WITH DIFFERENTIAL/PLATELET
Basophils Absolute: 0.1 K/uL (ref 0.0–0.1)
Basophils Relative: 0.9 % (ref 0.0–3.0)
Eosinophils Absolute: 0.2 K/uL (ref 0.0–0.7)
Eosinophils Relative: 2.3 % (ref 0.0–5.0)
HCT: 41.1 % (ref 39.0–52.0)
Hemoglobin: 13.5 g/dL (ref 13.0–17.0)
Lymphocytes Relative: 13 % (ref 12.0–46.0)
Lymphs Abs: 1 K/uL (ref 0.7–4.0)
MCHC: 32.8 g/dL (ref 30.0–36.0)
MCV: 84.6 fl (ref 78.0–100.0)
Monocytes Absolute: 0.8 K/uL (ref 0.1–1.0)
Monocytes Relative: 11 % (ref 3.0–12.0)
Neutro Abs: 5.6 K/uL (ref 1.4–7.7)
Neutrophils Relative %: 72.8 % (ref 43.0–77.0)
Platelets: 216 K/uL (ref 150.0–400.0)
RBC: 4.86 Mil/uL (ref 4.22–5.81)
RDW: 14.7 % (ref 11.5–15.5)
WBC: 7.7 K/uL (ref 4.0–10.5)

## 2023-11-09 LAB — URIC ACID: Uric Acid, Serum: 9.4 mg/dL — ABNORMAL HIGH (ref 4.0–7.8)

## 2023-11-09 LAB — LIPID PANEL
Cholesterol: 90 mg/dL (ref 0–200)
HDL: 45.5 mg/dL (ref >39.00)
LDL Cholesterol: 26 mg/dL (ref 0–99)
NonHDL: 44.28
Total CHOL/HDL Ratio: 2
Triglycerides: 89 mg/dL (ref 0.0–149.0)
VLDL: 17.8 mg/dL (ref 0.0–40.0)

## 2023-11-09 LAB — HEMOGLOBIN A1C: Hgb A1c MFr Bld: 5.7 % (ref 4.6–6.5)

## 2023-11-09 NOTE — Progress Notes (Signed)
 Subjective:    [name], male    DOB: [birth year], 78 y.o.   MRN: [id number]  Chief Complaint  Patient presents with   Hypertension   Hyperlipidemia   Follow-up    HPI Patient is in today for f/u---- Discussed the use of AI scribe software for clinical note transcription with the patient, who gave verbal consent to proceed.  History of Present Illness Travis Daniels is a 78 year old male who presents for a routine follow-up visit.  His blood sugars range from 91 to 160 mg/dL, and he feels comfortable with these levels. His hemoglobin A1c was last recorded at 5.4%, indicating good glycemic control.  He has a history of cellulitis in his legs, previously treated with wrapping and medication. He experiences swelling and blister-like bumps with clear liquid due to the swelling. Treatment at a dermatology clinic involved wrapping his legs for three weeks, which temporarily reduced the swelling.  He has sleep apnea and uses a CPAP machine at night but does not take it when traveling. An implantable device was offered by the [facility], but he has not pursued it. He is on his second CPAP machine, replaced every five years.  He experiences joint pain, particularly in his knees, and receives intra-articular injections every six months, which help reduce the pain but do not eliminate it. He is considering weight loss to qualify for knee surgery, needing to lose an additional 15-25 pounds.  He is managing his weight by reducing portion sizes and avoiding certain foods like white bread and alcohol. His current weight is 295.5 pounds, down from 310 pounds. He has not consumed alcohol in over five years and is trying to reduce his intake of sweet tea.    Past Medical History:  Diagnosis Date   Arthritis    B12 deficiency    COPD (chronic obstructive pulmonary disease) (HCC) 01/2011   FVC 47%, FEV1 49%    Cough    associated with exposure to lumber yard   Family history of  breast cancer    Family history of colon cancer    Family history of pancreatic cancer    Family history of prostate cancer    Family history of renal cancer    Hyperlipidemia    Hypertension    Lower extremity edema    Prostate cancer (HCC)    Secondary pulmonary hypertension 01/2011   RV syst pressure 30-40 mm Hg on ECHO     Past Surgical History:  Procedure Laterality Date   COLONOSCOPY W/ POLYPECTOMY  12/19/07   one small polyp, sigmoid diverticulosis, lipoma rectum, agioectasia/AVM in descending colon (Dr. Betha at [facility] in [place], [place])   ENDOVENOUS ABLATION SAPHENOUS VEIN W/ LASER Left 09/18/2016   endovenous laser ablation left greater saphenous vein by Lynwood Collum MD    ENDOVENOUS ABLATION SAPHENOUS VEIN W/ LASER Left 10/09/2016   endovenous laser ablation left small saphenous vein by Lynwood Collum MD    FINGER SURGERY Right 11   rt middle finger   PROSTATE BIOPSY     TOTAL SHOULDER ARTHROPLASTY Right 01/21/2013   Procedure: SHOULDER HEMI ARTHROPLASTY;  Surgeon: Maude LELON Right, MD;  [facility];  Service: Orthopedics;  Laterality: Right;   TRANSTHORACIC ECHOCARDIOGRAM  01/2011   EF 55-60%, mild asymmetric LVH, elevated RV systolic pressure   VASECTOMY      Family History  Problem Relation Age of Onset   CAD Mother  MI in her 30s   Skin cancer Mother        multiple skin cancers removed from face and head   Cancer Father        colon and pancreas   Colon cancer Father    Pancreatic cancer Father    Epilepsy Son    Cancer Maternal Grandmother 74       stomach   Cancer Maternal Grandfather        prostate; died at the age of 39   Cancer Cousin        paternal first cousin kidney dx under 7   Breast cancer Cousin        maternal first cousin with breast cancer over 41   Esophageal cancer Neg Hx     Social History   Socioeconomic History   Marital status: Married    Spouse name: Not on file   Number of children: 4   Years of  education: Not on file   Highest education level: Not on file  Occupational History   Occupation: retired  Tobacco Use   Smoking status: Never   Smokeless tobacco: Never   Tobacco comments:    occ alcohol  Vaping Use   Vaping status: Never Used  Substance and Sexual Activity   Alcohol use: Not Currently    Comment: 2-3 drinks occasionally   Drug use: No   Sexual activity: Yes  Other Topics Concern   Not on file  Social History Narrative   Mr. Folts lives with his wife in [place]. He is recently retired and relocated to [facility] from [place]. Historically, his medical provider has been [facility] in [place].   Social Drivers of Corporate investment banker Strain: Low Risk  (11/05/2023)   Overall Financial Resource Strain (CARDIA)    Difficulty of Paying Living Expenses: Not very hard  Food Insecurity: No Food Insecurity (11/05/2023)   Hunger Vital Sign    Worried About Running Out of Food in the Last Year: Never true    Ran Out of Food in the Last Year: Never true  Transportation Needs: No Transportation Needs (11/05/2023)   PRAPARE - Administrator, Civil Service (Medical): No    Lack of Transportation (Non-Medical): No  Physical Activity: Insufficiently Active (11/05/2023)   Exercise Vital Sign    Days of Exercise per Week: 3 days    Minutes of Exercise per Session: 20 min  Stress: No Stress Concern Present (11/05/2023)   [employer] of Occupational Health - Occupational Stress Questionnaire    Feeling of Stress: Not at all  Social Connections: Not on file  Intimate Partner Violence: Not At Risk (11/05/2023)   Humiliation, Afraid, Rape, and Kick questionnaire    Fear of Current or Ex-Partner: No    Emotionally Abused: No    Physically Abused: No    Sexually Abused: No    Outpatient Medications Prior to Visit  Medication Sig Dispense Refill   albuterol  (PROVENTIL  HFA;VENTOLIN  HFA) 108 (90 BASE) MCG/ACT inhaler Inhale 2 puffs into the lungs every 6 (six) hours as  needed for wheezing or shortness of breath.     allopurinol  (ZYLOPRIM ) 300 MG tablet TAKE 1 TABLET BY MOUTH EVERY DAY. Pt needs office visit for further refills 14 tablet 0   chlorthalidone  (HYGROTON ) 25 MG tablet Take 25 mg by mouth daily.     Cholecalciferol 50 MCG (2000 UT) TABS Take 2,000 Units by mouth daily.     diltiazem  (CARDIZEM  CD)  300 MG 24 hr capsule Take 1 capsule (300 mg total) by mouth daily. 90 capsule 3   GARLIC PO Take 1 capsule by mouth daily.     glucose blood test strip Use twice daily as instructed 100 each 6   KRILL OIL PO Take 1 capsule by mouth daily.     LORazepam  (ATIVAN ) 1 MG tablet Take 1 tablet (1 mg total) by mouth as needed for anxiety (take one tablet 30 minutes prior to MRI and may repeat once, just prior to scan if needed.). 2 tablet 0   losartan  (COZAAR ) 100 MG tablet TAKE 1 TABLET BY MOUTH EVERY DAY 90 tablet 1   ONETOUCH DELICA LANCETS FINE MISC Check blood sugar daily 100 each 12   rosuvastatin (CRESTOR) 20 MG tablet Take 20 mg by mouth daily.     tiotropium (SPIRIVA ) 18 MCG inhalation capsule Place 18 mcg into inhaler and inhale daily.     trimethoprim -polymyxin b  (POLYTRIM ) ophthalmic solution Place 1 drop into the left eye every 4 (four) hours. 10 mL 0   aspirin  EC 81 MG tablet Take 81 mg by mouth daily. (Patient not taking: Reported on 11/09/2023)     Facility-Administered Medications Prior to Visit  Medication Dose Route Frequency Provider Last Rate Last Admin   0.9 %  sodium chloride  infusion  500 mL Intravenous Once Teressa Toribio SQUIBB, MD        No Known Allergies  Review of Systems  Constitutional:  Negative for chills, fever and malaise/fatigue.  HENT:  Negative for congestion and hearing loss.   Eyes:  Negative for discharge.  Respiratory:  Negative for cough, sputum production and shortness of breath.   Cardiovascular:  Negative for chest pain, palpitations and leg swelling.  Gastrointestinal:  Negative for abdominal pain, blood in stool,  constipation, diarrhea, heartburn, nausea and vomiting.  Genitourinary:  Negative for dysuria, frequency, hematuria and urgency.  Musculoskeletal:  Negative for back pain, falls and myalgias.  Skin:  Negative for rash.  Neurological:  Negative for dizziness, sensory change, loss of consciousness, weakness and headaches.  Endo/Heme/Allergies:  Negative for environmental allergies. Does not bruise/bleed easily.  Psychiatric/Behavioral:  Negative for depression and suicidal ideas. The patient is not nervous/anxious and does not have insomnia.        Objective:    Physical Exam Vitals and nursing note reviewed.  Constitutional:      General: He is not in acute distress.    Appearance: Normal appearance. He is well-developed.  HENT:     Head: Normocephalic and atraumatic.     Right Ear: Tympanic membrane, ear canal and external ear normal. There is no impacted cerumen.     Left Ear: Tympanic membrane, ear canal and external ear normal. There is no impacted cerumen.     Nose: Nose normal.     Mouth/Throat:     Mouth: Mucous membranes are moist.     Pharynx: Oropharynx is clear. No oropharyngeal exudate or posterior oropharyngeal erythema.  Eyes:     General: No scleral icterus.       Right eye: No discharge.        Left eye: No discharge.     Conjunctiva/sclera: Conjunctivae normal.     Pupils: Pupils are equal, round, and reactive to light.  Neck:     Thyroid : No thyromegaly.     Vascular: No JVD.  Cardiovascular:     Rate and Rhythm: Normal rate and regular rhythm.     Heart sounds: Normal heart  sounds. No murmur heard. Pulmonary:     Effort: Pulmonary effort is normal. No respiratory distress.     Breath sounds: Normal breath sounds.  Abdominal:     General: Bowel sounds are normal. There is no distension.     Palpations: Abdomen is soft. There is no mass.     Tenderness: There is no abdominal tenderness. There is no guarding or rebound.  Musculoskeletal:        General:  Normal range of motion.     Cervical back: Normal range of motion and neck supple.     Right lower leg: No edema.     Left lower leg: No edema.  Lymphadenopathy:     Cervical: No cervical adenopathy.  Skin:    General: Skin is warm and dry.     Findings: No erythema or rash.  Neurological:     Mental Status: He is alert and oriented to person, place, and time.     Cranial Nerves: No cranial nerve deficit.     Motor: No abnormal muscle tone.     Deep Tendon Reflexes: Reflexes are normal and symmetric. Reflexes normal.  Psychiatric:        Mood and Affect: Mood normal.        Behavior: Behavior normal.        Thought Content: Thought content normal.        Judgment: Judgment normal.     BP 132/78 (BP Location: Left Arm, Patient Position: Sitting, Cuff Size: Large)   Pulse 83   Temp 97.9 F (36.6 C) (Oral)   Resp 18   Ht 6' 1 (1.854 m)   Wt (!) 310 lb (140.6 kg)   SpO2 96%   BMI 40.90 kg/m  Wt Readings from Last 3 Encounters:  11/09/23 (!) 310 lb (140.6 kg)  11/05/23 (!) 312 lb 12.8 oz (141.9 kg)  07/23/23 (!) 310 lb 3.2 oz (140.7 kg)    Diabetic Foot Exam - Simple   No data filed    Lab Results  Component Value Date   WBC 7.7 11/09/2023   HGB 13.5 11/09/2023   HCT 41.1 11/09/2023   PLT 216.0 11/09/2023   GLUCOSE 86 11/09/2023   CHOL 90 11/09/2023   TRIG 89.0 11/09/2023   HDL 45.50 11/09/2023   LDLDIRECT 59.0 03/01/2017   LDLCALC 26 11/09/2023   ALT 8 11/09/2023   AST 10 11/09/2023   NA 144 11/09/2023   K 3.6 11/09/2023   CL 101 11/09/2023   CREATININE 1.64 (H) 11/09/2023   BUN 21 11/09/2023   CO2 33 (H) 11/09/2023   TSH 0.736 09/01/2016   PSA 0.31 12/12/2022   INR 1.23 09/01/2016   HGBA1C 5.7 11/09/2023   MICROALBUR 69.1 02/26/2015    Lab Results  Component Value Date   TSH 0.736 09/01/2016   Lab Results  Component Value Date   WBC 7.7 11/09/2023   HGB 13.5 11/09/2023   HCT 41.1 11/09/2023   MCV 84.6 11/09/2023   PLT 216.0 11/09/2023    Lab Results  Component Value Date   NA 144 11/09/2023   K 3.6 11/09/2023   CO2 33 (H) 11/09/2023   GLUCOSE 86 11/09/2023   BUN 21 11/09/2023   CREATININE 1.64 (H) 11/09/2023   BILITOT 0.8 11/09/2023   ALKPHOS 81 11/09/2023   AST 10 11/09/2023   ALT 8 11/09/2023   PROT 6.4 11/09/2023   ALBUMIN  3.9 11/09/2023   CALCIUM 9.8 11/09/2023   ANIONGAP 7 02/19/2017   EGFR  46 06/06/2023   GFR 39.80 (L) 11/09/2023   Lab Results  Component Value Date   CHOL 90 11/09/2023   Lab Results  Component Value Date   HDL 45.50 11/09/2023   Lab Results  Component Value Date   LDLCALC 26 11/09/2023   Lab Results  Component Value Date   TRIG 89.0 11/09/2023   Lab Results  Component Value Date   CHOLHDL 2 11/09/2023   Lab Results  Component Value Date   HGBA1C 5.7 11/09/2023       Assessment & Plan:  Primary hypertension  Hyperlipidemia associated with type 2 diabetes mellitus (HCC) -     Lipid panel -     CBC with Differential/Platelet -     Comprehensive metabolic panel with GFR -     Hemoglobin A1c  Idiopathic chronic gout of foot without tophus, unspecified laterality -     Uric acid  Prostate cancer (HCC)  Controlled type 2 diabetes mellitus with stage 3 chronic kidney disease, without long-term current use of insulin  (HCC) -     Comprehensive metabolic panel with GFR   Assessment and Plan Assessment & Plan Obesity   He weighs 310 lbs, up from 295.5 lbs, and struggles to lose weight despite dietary changes. Weight loss is crucial for knee surgery. He is considering weight loss medications like Wegovy, Ozempic, or Mounjaro, with Mounjaro observed to be more effective. Discuss Mounjaro for weight loss and encourage continued dietary modifications and weight monitoring.  Chronic bilateral knee pain   He experiences chronic bilateral knee pain with swelling exacerbated by weight gain, limiting physical activity. Pain is less severe after joint injections but persists.  Weight loss is necessary for surgical intervention. Encourage weight loss to facilitate knee surgery and continue joint injections as needed for pain management.  Chronic lower extremity edema and history of cellulitis   Chronic lower extremity edema is managed with leg wrapping and topical medication. Swelling persists. No new cellulitis episodes reported. Continue current management for edema and monitor for signs of cellulitis recurrence.  Obstructive sleep apnea   Obstructive sleep apnea is managed with CPAP. Discuss the potential for an implantable device that may be covered by the [employer]. Weight loss may improve symptoms. Discuss the potential for an implantable sleep apnea device and encourage weight loss to potentially improve sleep apnea.  Type 2 diabetes mellitus without complications   Type 2 diabetes mellitus is well-controlled with a recent hemoglobin A1c of 5.4. Blood sugars range from 91 to 160 mg/dL. Continue current diabetes management.   Teona Vargus R Lowne Chase, DO

## 2023-11-09 NOTE — Patient Instructions (Signed)

## 2023-11-12 ENCOUNTER — Ambulatory Visit: Payer: Self-pay | Admitting: Family Medicine

## 2023-12-22 ENCOUNTER — Other Ambulatory Visit: Payer: Self-pay | Admitting: Family Medicine

## 2023-12-22 DIAGNOSIS — I10 Essential (primary) hypertension: Secondary | ICD-10-CM

## 2024-01-07 DIAGNOSIS — I890 Lymphedema, not elsewhere classified: Secondary | ICD-10-CM | POA: Diagnosis not present

## 2024-01-07 DIAGNOSIS — L2089 Other atopic dermatitis: Secondary | ICD-10-CM | POA: Diagnosis not present

## 2024-01-21 DIAGNOSIS — N281 Cyst of kidney, acquired: Secondary | ICD-10-CM | POA: Diagnosis not present

## 2024-01-21 DIAGNOSIS — R3121 Asymptomatic microscopic hematuria: Secondary | ICD-10-CM | POA: Diagnosis not present

## 2024-01-21 DIAGNOSIS — C61 Malignant neoplasm of prostate: Secondary | ICD-10-CM | POA: Diagnosis not present

## 2024-05-09 ENCOUNTER — Ambulatory Visit: Admitting: Family Medicine

## 2024-11-05 ENCOUNTER — Ambulatory Visit
